# Patient Record
Sex: Male | Born: 1935 | Race: White | Hispanic: No | Marital: Married | Smoking: Never smoker
Health system: Southern US, Community
[De-identification: ages and names within clinical notes are randomized; demographics above are authoritative.]

## PROBLEM LIST (undated history)

## (undated) DIAGNOSIS — G4739 Other sleep apnea: Secondary | ICD-10-CM

## (undated) DIAGNOSIS — N529 Male erectile dysfunction, unspecified: Secondary | ICD-10-CM

## (undated) DIAGNOSIS — K5790 Diverticulosis of intestine, part unspecified, without perforation or abscess without bleeding: Secondary | ICD-10-CM

## (undated) DIAGNOSIS — K649 Unspecified hemorrhoids: Secondary | ICD-10-CM

## (undated) DIAGNOSIS — E559 Vitamin D deficiency, unspecified: Secondary | ICD-10-CM

## (undated) DIAGNOSIS — G4731 Primary central sleep apnea: Secondary | ICD-10-CM

## (undated) DIAGNOSIS — G4730 Sleep apnea, unspecified: Secondary | ICD-10-CM

## (undated) DIAGNOSIS — D8989 Other specified disorders involving the immune mechanism, not elsewhere classified: Secondary | ICD-10-CM

## (undated) DIAGNOSIS — C4491 Basal cell carcinoma of skin, unspecified: Secondary | ICD-10-CM

## (undated) DIAGNOSIS — M4802 Spinal stenosis, cervical region: Secondary | ICD-10-CM

## (undated) DIAGNOSIS — C801 Malignant (primary) neoplasm, unspecified: Secondary | ICD-10-CM

## (undated) DIAGNOSIS — I442 Atrioventricular block, complete: Secondary | ICD-10-CM

## (undated) DIAGNOSIS — M1990 Unspecified osteoarthritis, unspecified site: Secondary | ICD-10-CM

## (undated) DIAGNOSIS — R7301 Impaired fasting glucose: Secondary | ICD-10-CM

## (undated) DIAGNOSIS — B029 Zoster without complications: Secondary | ICD-10-CM

## (undated) DIAGNOSIS — K219 Gastro-esophageal reflux disease without esophagitis: Secondary | ICD-10-CM

## (undated) DIAGNOSIS — M8580 Other specified disorders of bone density and structure, unspecified site: Secondary | ICD-10-CM

## (undated) DIAGNOSIS — N400 Enlarged prostate without lower urinary tract symptoms: Secondary | ICD-10-CM

## (undated) DIAGNOSIS — E785 Hyperlipidemia, unspecified: Secondary | ICD-10-CM

## (undated) DIAGNOSIS — K2970 Gastritis, unspecified, without bleeding: Secondary | ICD-10-CM

## (undated) DIAGNOSIS — E119 Type 2 diabetes mellitus without complications: Secondary | ICD-10-CM

## (undated) DIAGNOSIS — K4090 Unilateral inguinal hernia, without obstruction or gangrene, not specified as recurrent: Secondary | ICD-10-CM

## (undated) DIAGNOSIS — K274 Chronic or unspecified peptic ulcer, site unspecified, with hemorrhage: Secondary | ICD-10-CM

## (undated) HISTORY — DX: Primary central sleep apnea: G47.31

## (undated) HISTORY — DX: Other specified disorders of bone density and structure, unspecified site: M85.80

## (undated) HISTORY — DX: Impaired fasting glucose: R73.01

## (undated) HISTORY — DX: Unspecified osteoarthritis, unspecified site: M19.90

## (undated) HISTORY — DX: Unspecified hemorrhoids: K64.9

## (undated) HISTORY — DX: Malignant (primary) neoplasm, unspecified: C80.1

## (undated) HISTORY — DX: Vitamin D deficiency, unspecified: E55.9

## (undated) HISTORY — DX: Diverticulosis of intestine, part unspecified, without perforation or abscess without bleeding: K57.90

## (undated) HISTORY — PX: TONSILLECTOMY: SUR1361

## (undated) HISTORY — DX: Spinal stenosis, cervical region: M48.02

## (undated) HISTORY — DX: Basal cell carcinoma of skin, unspecified: C44.91

## (undated) HISTORY — DX: Benign prostatic hyperplasia without lower urinary tract symptoms: N40.0

## (undated) HISTORY — DX: Atrioventricular block, complete: I44.2

## (undated) HISTORY — DX: Sleep apnea, unspecified: G47.30

## (undated) HISTORY — DX: Other sleep apnea: G47.39

## (undated) HISTORY — PX: HERNIA REPAIR: SHX51

## (undated) HISTORY — DX: Chronic or unspecified peptic ulcer, site unspecified, with hemorrhage: K27.4

## (undated) HISTORY — DX: Gastritis, unspecified, without bleeding: K29.70

## (undated) HISTORY — PX: CATARACT EXTRACTION, BILATERAL: SHX1313

## (undated) HISTORY — DX: Male erectile dysfunction, unspecified: N52.9

## (undated) HISTORY — DX: Other specified disorders involving the immune mechanism, not elsewhere classified: D89.89

## (undated) HISTORY — DX: Hyperlipidemia, unspecified: E78.5

## (undated) HISTORY — DX: Unilateral inguinal hernia, without obstruction or gangrene, not specified as recurrent: K40.90

## (undated) HISTORY — DX: Zoster without complications: B02.9

---

## 1982-04-11 HISTORY — PX: BACK SURGERY: SHX140

## 1982-04-11 HISTORY — PX: LAMINECTOMY: SHX219

## 1991-04-12 DIAGNOSIS — K274 Chronic or unspecified peptic ulcer, site unspecified, with hemorrhage: Secondary | ICD-10-CM

## 1991-04-12 HISTORY — DX: Chronic or unspecified peptic ulcer, site unspecified, with hemorrhage: K27.4

## 1998-05-21 ENCOUNTER — Ambulatory Visit: Admission: RE | Admit: 1998-05-21 | Discharge: 1998-05-21 | Payer: Self-pay | Admitting: Internal Medicine

## 2000-10-31 ENCOUNTER — Ambulatory Visit (HOSPITAL_COMMUNITY): Admission: RE | Admit: 2000-10-31 | Discharge: 2000-10-31 | Payer: Self-pay | Admitting: Gastroenterology

## 2006-04-11 DIAGNOSIS — C801 Malignant (primary) neoplasm, unspecified: Secondary | ICD-10-CM

## 2006-04-11 DIAGNOSIS — N400 Enlarged prostate without lower urinary tract symptoms: Secondary | ICD-10-CM

## 2006-04-11 HISTORY — DX: Benign prostatic hyperplasia without lower urinary tract symptoms: N40.0

## 2006-04-11 HISTORY — DX: Malignant (primary) neoplasm, unspecified: C80.1

## 2006-12-11 HISTORY — PX: PROSTATE SURGERY: SHX751

## 2007-11-06 ENCOUNTER — Ambulatory Visit: Admission: RE | Admit: 2007-11-06 | Discharge: 2007-12-25 | Payer: Self-pay | Admitting: Radiation Oncology

## 2007-12-20 ENCOUNTER — Encounter (INDEPENDENT_AMBULATORY_CARE_PROVIDER_SITE_OTHER): Payer: Self-pay | Admitting: Urology

## 2007-12-20 ENCOUNTER — Inpatient Hospital Stay (HOSPITAL_COMMUNITY): Admission: RE | Admit: 2007-12-20 | Discharge: 2007-12-22 | Payer: Self-pay | Admitting: Urology

## 2010-08-24 NOTE — Op Note (Signed)
NAMEFAWAZ, BORQUEZ               ACCOUNT NO.:  [id number]   MEDICAL RECORD NO.:  [id number]          PATIENT TYPE:  INP   LOCATION:  1423                         FACILITY:  [facility]   PHYSICIAN:  Heloise Purpura, MD      DATE OF BIRTH:  March 06, 1935   DATE OF PROCEDURE:  12/20/2007  DATE OF DISCHARGE:                               OPERATIVE REPORT   PREOPERATIVE DIAGNOSIS:  Clinically localized adenocarcinoma of the  prostate (clinical stage T1c NX MX).   POSTOPERATIVE DIAGNOSIS:  Clinically localized adenocarcinoma of the  prostate (clinical stage T1c NX MX).   PROCEDURE:  1. Robotic assisted laparoscopic radical prostatectomy (bilateral      nerve sparing).  2. Bilateral laparoscopic pelvic lymphadenectomy.   SURGEON:  Heloise Purpura, MD   ASSISTANTS:  Delia Chimes, NP and Dr. Delman Kitten.   ANESTHESIA:  General.   COMPLICATIONS:  None.   ESTIMATED BLOOD LOSS:  200 mL.   INTRAVENOUS FLUIDS:  1200 mL of lactated Ringer's.   SPECIMENS:  1. Prostate and seminal vesicles.  2. Right pelvic lymph nodes.  3. Left pelvic lymph nodes.   DISPOSITION OF SPECIMEN:  To pathology.   DRAINS:  1. 20 French coude catheter.  2. #19 Blake pelvic drain.   INDICATION:  Mr. Otho Darner is a 76 year old gentleman with clinically  localized adenocarcinoma of the prostate.  After discussion regarding  management options for treatment, he elected to proceed with surgical  therapy and the above procedures.  Potential risks, complications and  alternative treatment options were discussed in detail and informed  consent was obtained.   DESCRIPTION OF PROCEDURE:  The patient was taken to the operating room  and a general anesthetic was administered.  He was given preoperative  antibiotics, placed in the dorsal lithotomy position, and prepped and  draped in the usual sterile fashion.  Next a preoperative time-out was  performed.  A site was selected just to the left of the umbilicus for  placement  of the camera port.  This was placed using a standard open  Hasson technique which allowed entry into the peritoneal cavity under  direct vision and without difficulty.  A Foley catheter had been placed  and a 12 mm port was placed and a pneumoperitoneum was established.  A  zero degree lens was used to inspect the abdomen and there was no  evidence for any intra-abdominal injuries or other abnormalities.  The  remaining ports were then placed.  Bilateral 8 mm ports were placed 10  cm lateral to and just inferior to the camera port site.  An additional  8 mm robotic port was placed in the far left lateral abdominal wall.  A  5 mm port was placed between the camera port and the right robotic port  and an additional 12 mm port was placed in the far right lateral  abdominal wall for laparoscopic assistance.  All ports were placed under  direct vision and without difficulty.  The surgical cart was then  docked.  With the aid of the cautery scissors, the bladder was reflected  posteriorly allowing entry  into the space of Retzius and identification  of the endopelvic fascia and prostate.  The endopelvic fascia was then  incised from the apex back to the base of the prostate bilaterally and  the underlying levator muscle fibers were swept laterally off the  prostate thereby isolating the dorsal venous complex.  The dorsal venous  complex was then stapled and divided with a 45 mm flex ETS stapler.  The  bladder neck was then identified with the aid of Foley catheter  manipulation and was divided anteriorly.  The catheter was noted to be  just to the left of midline and a small median lobe was identified.  The  posterior bladder neck was then divided with care to enucleate the small  median lobe.  The dissection then proceeded between the bladder neck and  prostate until the vasa deferentia and seminal vesicles were identified.  The vasa deferentia were isolated, divided and lifted anteriorly.   The  seminal vesicles were then dissected down to their tips with care to  control the seminal vesicle arterial blood supply.  The seminal vesicles  were then lifted anteriorly and the space between Denonvilliers fascia  and the anterior rectum was bluntly developed thereby isolating the  vascular pedicles of the prostate.  The lateral prostatic fascia was  then incised sharply allowing the neurovascular bundles to be released.  The vascular pedicles of the prostate were then ligated with Hem-o-lok  clips above the level of the neurovascular bundles and divided with  sharp cold scissors dissection.  A small amount of periprosthetic tissue  was left on the prostate.  The neurovascular bundles were then swept off  the apex of the prostate and the urethra.  The urethra was sharply  divided allowing the prostate specimen to be disarticulated.  The pelvis  was then copiously irrigated and hemostasis was ensured.  There was no  evidence for a rectal injury.  Attention then turned to the right pelvic  sidewall.   The fibrofatty tissue between the external iliac vein, confluence of the  iliac vessels, hypogastric artery and Cooper's ligament was dissected  free from the pelvic sidewall with care to preserve the obturator nerve.  Hem-o-lok clips were used for lymphostasis and hemostasis.  An identical  procedure was then performed on the contralateral side.  Both specimens  were removed for permanent pathologic analysis.  Attention then returned  to the pelvis and preparations were made for the urethral anastomosis.  During the dissection between the bladder neck and the prostate, there  was noted to be a small cystotomy noted posteriorly in the midline of  the bladder.  This was well caudal to the trigone and was repaired with  a figure-of-eight 3-0 Vicryl suture. The ureters were identified with  indigo carmine administration. A 2-0 Vicryl slip-knot was then placed  between Denonvilliers fascia  of the posterior bladder neck and the  posterior urethra to reapproximate these structures.  As this slip knot  was cinched down the bladder neck portion did pull through.  However,  the urethra was reapproximated to Denonvilliers fascia bringing it back  into its normal anatomic position in the pelvis.  A separate 2-0 Vicryl  suture was then used to close the defect in the bladder neck and  reapproximate the bladder neck to the posterior urethra.  A double-armed  3-0 Monocryl suture was then used to perform a 360 degree running  tension-free anastomosis between the bladder neck and urethra.  A new 20  [place] coude catheter was placed and irrigated.  There were no blood  clots within the bladder and the anastomosis appeared to be watertight.  A #19 Blake drain was then brought through the left robotic port and  appropriately positioned in the pelvis.  It was secured to the skin with  a nylon suture.  The surgical cart was then undocked.  The right lateral  12 mm port site was closed with the zero Vicryl suture placed with the  aid of a suture passer device.  All remaining ports were removed under  direct vision and the prostate specimen was removed intact within the  Endopouch retrieval bag.  This fascial opening was then closed with a  running zero Vicryl suture.  All  port sites were injected with quarter percent Marcaine and  reapproximated at the skin level with staples.  Sterile dressings were  applied.  The patient appeared to tolerate the procedure well without  complications.  He was able to be extubated and transferred to the  recovery unit in satisfactory condition.      Heloise Purpura, MD  Electronically Signed     LB/MEDQ  D:  12/20/2007  T:  12/20/2007  Job:  [id number]

## 2010-08-24 NOTE — Discharge Summary (Signed)
Jeffery [name], Jeffery [name]               ACCOUNT NO.:  [id number]   MEDICAL RECORD NO.:  [id number]          PATIENT TYPE:  INP   LOCATION:  1423                         [facility]AC[facility]   PHYSICIAN:  Heloise Purpura, MD      DATE OF BIRTH:  [birth year]   DATE OF ADMISSION:  12/20/2007  DATE OF DISCHARGE:  12/22/2007                               DISCHARGE SUMMARY   ADMISSION DIAGNOSIS:  Clinically localized adenocarcinoma of the  prostate.   POSTOPERATIVE DIAGNOSIS:  Clinically localized adenocarcinoma of the  prostate.   HISTORY AND PHYSICAL:  For full details please see admission history and  physical.  Briefly, [name] is a 76 year old gentleman with clinically  localized adenocarcinoma of the prostate.  After our discussion  regarding management options for treatment, he elected to proceed with  surgical therapy and a robotic prostatectomy.   HOSPITAL COURSE:  On December 20, 2007, the patient was taken to the  operating room and underwent a robotic assisted laparoscopic radical  prostatectomy and bilateral pelvic lymphadenectomy.  He tolerated this  procedure well without complications.  Postoperatively, he was able to  be transferred to a regular hospital room following recovery from  anesthesia.  He was able to begin ambulating the night of surgery and  remained hemodynamically stable.  On postoperative day #1, his  hematocrit was checked and was noted to be 35.9 which had decreased from  43.5.  This was felt to likely be related to hemodilution and he did  remain monitored closely.  He remained hemodynamically stable.  A repeat  hematocrit was 33.9 and did not appear concerning for any acute blood  loss.  He was able to begin a clear liquid diet, which he tolerated  without difficulty.  He was able to be transitioned to oral pain  medication and was reevaluated on the afternoon of postoperative day #1.  At this time, he still was having significant abdominal pain and was not  ambulating as well as would be hoped for.  It was decided to have him  remain overnight for further evaluation.  On postoperative day #2, he  was ambulating well and tolerating a liquid diet, and did have return of  bowel function.  In addition, he maintained excellent urine output with  minimal output from his pelvic drain.  His pelvic drain was, therefore,  removed.   DISPOSITION:  Home.   DISCHARGE MEDICATIONS:  He was instructed to resume his regular home  medications excepting any aspirin, nonsteroidal anti-inflammatory drugs,  or herbal supplements.  He was given a prescription to take Vicodin as  needed for pain and told to begin Cipro 1 day prior to his return visit  for Foley catheter removal.   DISCHARGE INSTRUCTIONS:  He was instructed to be ambulatory but  specifically told to refrain from any heavy lifting, strenuous activity,  or driving.  He was told to gradually advance his diet once passing  flatus.  He was also instructed on routine Foley catheter care.   FOLLOW UP:  He will follow up in 1 week for removal of Foley catheter  and to discuss his surgical pathology in detail.      Heloise Purpura, MD  Electronically Signed     LB/MEDQ  D:  12/23/2007  T:  12/23/2007  Job:  [id number]

## 2011-01-12 LAB — BASIC METABOLIC PANEL
Calcium: 8.2 — ABNORMAL LOW
Chloride: 102
Chloride: 103
Creatinine, Ser: 0.77
GFR calc Af Amer: >60
GFR calc non Af Amer: >60
Glucose, Bld: 188 — ABNORMAL HIGH
Potassium: 4.1
Sodium: 139

## 2011-01-12 LAB — TYPE AND SCREEN: Antibody Screen: NEGATIVE

## 2011-01-12 LAB — CBC
HCT: 44.9
Hemoglobin: 14.6
RDW: 12.1

## 2011-01-12 LAB — HEMOGLOBIN AND HEMATOCRIT, BLOOD
HCT: 35.9 — ABNORMAL LOW
HCT: 43.5
Hemoglobin: 12.1 — ABNORMAL LOW

## 2011-08-02 ENCOUNTER — Other Ambulatory Visit: Payer: Self-pay | Admitting: Dermatology

## 2011-10-18 ENCOUNTER — Other Ambulatory Visit (INDEPENDENT_AMBULATORY_CARE_PROVIDER_SITE_OTHER): Payer: Self-pay | Admitting: Surgery

## 2012-02-28 ENCOUNTER — Other Ambulatory Visit: Payer: Self-pay | Admitting: Orthopedic Surgery

## 2012-02-28 NOTE — Progress Notes (Signed)
Talked with pt 

## 2012-02-29 NOTE — H&P (Signed)
    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  He presents for evaluation of chronic swelling and stiffness of his left long finger PIP joint with locking of the finger in flexion consistent with a chronic trigger finger. He also has a very small cyst overlying the A-3 pulley of his right long finger flexor sheath.  Jake Shark has seronegative arthritis managed by [name] with Plaquenil.  He has history of prostate cancer diagnosed in 2009.  He has had basal cell skin cancer.  He has had hyperlipidemia and history of duodenal ulcer.  He denies numbness or pain.  He does have background osteoarthritis.    His past history is reviewed in detail.  He is 6'2" tall and weighs 193 pounds.  He is not on any prescription medication for discomfort.  His past history reveals that he became severely nauseated on Demerol.  He is currently taking Plaquenil 200 mg. two doses daily, Lovaza one capsule daily, Zantac 150 mg. daily, WelChol 625 mg. six tablets daily,  bupropion 150 mg. daily and Voltaren Gel apply to the fingers PRN.  Prior surgery, tonsillectomy 1948, back surgery in 1984, prostate surgery 2009.   Social history reveals that he is married, he is a nonsmoker, and he enjoys a rare alcoholic beverage.   Family history is detailed and positive for history of coronary artery disease, high blood pressure and arthritis affecting primary relatives.  14-point review of systems reveals corrective lenses, cataracts, he is anticipating cataract surgery in one month.  He had duodenal ulcer in 1993 without signs of recurrence.    Physical examination reveals a well appearing 77 year-old gentleman. He has Heberden's and Bouchard's nodes evidencing significant osteoarthritis.  He has full active flexion/extension of his finger and thumb with locking of his left long finger in flexion.  He has a 7 degree flexion contracture of his left long finger PIP joint. Pulse and capillary refill  are intact.  He has pain at the base of his right and left thumbs consistent with osteoarthritis. He has pain at his right wrist overlying the STT joint aggravated by radial and ulnar deviation and palmar flexion of the wrist.    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   ASSESSMENT:   1)  Locking left long finger trigger finger. 2) Pan trapezial arthrosis right wrist.   3) Left thumb CMC arthrosis.   PLAN:  We will schedule Jake Shark for release of his left long finger A-1 pulley and debridement of fibrotic tenosynovium at a mutually convenient time in the near future.  The surgery and aftercare were described in detail.   Left hand re examined in the holding area pre op.  Mr Merilyn Baba now has a 10 degree flexion contracture of the PIP joint and more stiffness in flexion.  This is noted to Mr and Mrs Merilyn Baba with inability to contact the palm in flexion.  This is the consequence of tolerating a trigger finger for many months and will take months of therapy to work out.  Questions were invited and answered in detail.  H&P documentation: 03/01/2012  -History and Physical Reviewed  -Patient has been re-examined  -No change in the plan of care  Wyn Forster, MD

## 2012-03-01 ENCOUNTER — Encounter (HOSPITAL_BASED_OUTPATIENT_CLINIC_OR_DEPARTMENT_OTHER): Payer: Self-pay

## 2012-03-01 ENCOUNTER — Encounter (HOSPITAL_BASED_OUTPATIENT_CLINIC_OR_DEPARTMENT_OTHER)
Admission: RE | Disposition: A | Discharge status: Home / Self Care | Payer: Self-pay | Source: Ambulatory Visit | Attending: Orthopedic Surgery

## 2012-03-01 ENCOUNTER — Ambulatory Visit (HOSPITAL_BASED_OUTPATIENT_CLINIC_OR_DEPARTMENT_OTHER)
Admission: RE | Admit: 2012-03-01 | Discharge: 2012-03-01 | Disposition: A | Discharge status: Home / Self Care | Payer: Medicare Other | Source: Ambulatory Visit | Attending: Orthopedic Surgery | Admitting: Orthopedic Surgery

## 2012-03-01 DIAGNOSIS — Z8261 Family history of arthritis: Secondary | ICD-10-CM | POA: Insufficient documentation

## 2012-03-01 DIAGNOSIS — M1990 Unspecified osteoarthritis, unspecified site: Secondary | ICD-10-CM | POA: Insufficient documentation

## 2012-03-01 DIAGNOSIS — Z8546 Personal history of malignant neoplasm of prostate: Secondary | ICD-10-CM | POA: Insufficient documentation

## 2012-03-01 DIAGNOSIS — Z85828 Personal history of other malignant neoplasm of skin: Secondary | ICD-10-CM | POA: Insufficient documentation

## 2012-03-01 DIAGNOSIS — M729 Fibroblastic disorder, unspecified: Secondary | ICD-10-CM | POA: Insufficient documentation

## 2012-03-01 DIAGNOSIS — M6530 Trigger finger, unspecified finger: Secondary | ICD-10-CM | POA: Insufficient documentation

## 2012-03-01 DIAGNOSIS — Z8249 Family history of ischemic heart disease and other diseases of the circulatory system: Secondary | ICD-10-CM | POA: Insufficient documentation

## 2012-03-01 DIAGNOSIS — M129 Arthropathy, unspecified: Secondary | ICD-10-CM | POA: Insufficient documentation

## 2012-03-01 HISTORY — PX: TRIGGER FINGER RELEASE: SHX641

## 2012-03-01 HISTORY — DX: Gastro-esophageal reflux disease without esophagitis: K21.9

## 2012-03-01 SURGERY — MINOR RELEASE TRIGGER FINGER/A-1 PULLEY
Anesthesia: LOCAL | Site: Finger | Laterality: Left | Wound class: Clean

## 2012-03-01 MED ORDER — CHLORHEXIDINE GLUCONATE 4 % EX LIQD: 60.0000 mL | Freq: Once | CUTANEOUS | Status: DC

## 2012-03-01 MED ORDER — LIDOCAINE HCL 2 % IJ SOLN
INTRAMUSCULAR | Status: DC | PRN
  Administered 2012-03-01: 4 mL

## 2012-03-01 MED ORDER — TRAMADOL HCL 50 MG PO TABS: ORAL_TABLET | ORAL | Status: DC

## 2012-03-01 SURGICAL SUPPLY — 33 items
BANDAGE ADHESIVE 1X3 (GAUZE/BANDAGES/DRESSINGS) IMPLANT
BLADE SURG 15 STRL LF DISP TIS (BLADE) ×1 IMPLANT
BLADE SURG 15 STRL SS (BLADE) ×1
BNDG ELASTIC 2 VLCR STRL LF (GAUZE/BANDAGES/DRESSINGS) ×2 IMPLANT
BNDG ESMARK 4X9 LF (GAUZE/BANDAGES/DRESSINGS) IMPLANT
BRUSH SCRUB EZ PLAIN DRY (MISCELLANEOUS) ×2 IMPLANT
CLOTH BEACON ORANGE TIMEOUT ST (SAFETY) ×2 IMPLANT
CORDS BIPOLAR (ELECTRODE) IMPLANT
COVER MAYO STAND STRL (DRAPES) ×2 IMPLANT
COVER TABLE BACK 60X90 (DRAPES) IMPLANT
CUFF TOURNIQUET SINGLE 18IN (TOURNIQUET CUFF) ×2 IMPLANT
DECANTER SPIKE VIAL GLASS SM (MISCELLANEOUS) IMPLANT
DRAPE SURG 17X23 STRL (DRAPES) ×2 IMPLANT
GAUZE SPONGE 4X4 12PLY STRL LF (GAUZE/BANDAGES/DRESSINGS) ×4 IMPLANT
GLOVE BIO SURGEON STRL SZ 6.5 (GLOVE) ×2 IMPLANT
GLOVE BIOGEL M STRL SZ7.5 (GLOVE) ×2 IMPLANT
GLOVE ORTHO TXT STRL SZ7.5 (GLOVE) ×2 IMPLANT
GOWN PREVENTION PLUS XLARGE (GOWN DISPOSABLE) ×2 IMPLANT
NEEDLE 27GAX1X1/2 (NEEDLE) ×4 IMPLANT
PACK BASIN DAY SURGERY FS (CUSTOM PROCEDURE TRAY) IMPLANT
PADDING CAST ABS 4INX4YD NS (CAST SUPPLIES) ×1
PADDING CAST ABS COTTON 4X4 ST (CAST SUPPLIES) ×1 IMPLANT
SPONGE GAUZE 4X4 12PLY (GAUZE/BANDAGES/DRESSINGS) ×2 IMPLANT
STOCKINETTE 4X48 STRL (DRAPES) ×2 IMPLANT
STRIP CLOSURE SKIN 1/2X4 (GAUZE/BANDAGES/DRESSINGS) ×2 IMPLANT
SUT PROLENE 3 0 PS 2 (SUTURE) IMPLANT
SUT PROLENE 4 0 P 3 18 (SUTURE) ×2 IMPLANT
SYR 3ML 23GX1 SAFETY (SYRINGE) IMPLANT
SYR CONTROL 10ML LL (SYRINGE) ×4 IMPLANT
TOWEL OR 17X24 6PK STRL BLUE (TOWEL DISPOSABLE) ×4 IMPLANT
TRAY DSU PREP LF (CUSTOM PROCEDURE TRAY) ×2 IMPLANT
UNDERPAD 30X30 INCONTINENT (UNDERPADS AND DIAPERS) IMPLANT
WATER STERILE IRR 1000ML POUR (IV SOLUTION) IMPLANT

## 2012-03-01 NOTE — Op Note (Signed)
[id number] 

## 2012-03-01 NOTE — Brief Op Note (Signed)
03/01/2012  8:24 AM  PATIENT:  Tibor H O'Tuel Jr.  77 y.o. male  PRE-OPERATIVE DIAGNOSIS:  locking stenosing tenosynovitis left long finger / dupuytren's  POST-OPERATIVE DIAGNOSIS:  locking stenosing tenosynovitis left long finger / dupuytrens  PROCEDURE:  Procedure(s) (LRB) with comments: MINOR RELEASE TRIGGER FINGER/A-1 PULLEY (Left) - long finger  SURGEON:  Surgeon(s) and Role:    * [name], MD - Primary  PHYSICIAN ASSISTANT:   ASSISTANTS:Kosei Rhodes Dasnoit,P.A-C    ANESTHESIA:   local  EBL:     BLOOD ADMINISTERED:none  DRAINS: none   LOCAL MEDICATIONS USED:  LIDOCAINE   SPECIMEN:  No Specimen  DISPOSITION OF SPECIMEN:  N/A  COUNTS:  YES  TOURNIQUET:   Total Tourniquet Time Documented: Upper Arm (Left) - 6 minutes  DICTATION: .Other Dictation: Dictation Number [id number]  PLAN OF CARE: Discharge to home after PACU  PATIENT DISPOSITION:  PACU - hemodynamically stable.

## 2012-03-02 ENCOUNTER — Encounter (HOSPITAL_BASED_OUTPATIENT_CLINIC_OR_DEPARTMENT_OTHER): Payer: Self-pay | Admitting: Orthopedic Surgery

## 2012-03-02 ENCOUNTER — Encounter (HOSPITAL_BASED_OUTPATIENT_CLINIC_OR_DEPARTMENT_OTHER): Payer: Self-pay

## 2012-03-02 NOTE — Op Note (Signed)
NAMERIGGS, DINEEN              ACCOUNT NO.:  [id number]  MEDICAL RECORD NO.:  [id number]  LOCATION:                               FACILITY:  [facility]  PHYSICIAN:  Katy Fitch. Fallynn Gravett, M.D. 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  OPERATION:  Release of left long finger A1 pulley with incidental resection of Dupuytren's palmar fibromatosis and gentle manipulation of PIP joint in flexion and extension to recover range of motion.  OPERATING SURGEON:  Katy Fitch. Charrie Mcconnon, MD  ASSISTANT:  Marveen Reeks Dasnoit, PA-C  ANESTHESIA:  Lidocaine 2% metacarpal head level block and flexor sheath block of left long finger.  This was performed as a minor operating room procedure.  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  We have been acquainted for more than 25 years as we were at one point neighbors.  He presented for evaluation of carpometacarpal arthrosis of his wrist and locking left long finger.  He was developing an early flexion contracture to left long finger.  We advised that if he allowed his trigger finger to be chronic, he may develop a very stiff PIP joint.  At this time, he is scheduled for release of his left long finger A1 pulley and we anticipated incidental resection of early  Dupuytren's palmar fibromatosis overlying the pretendinous fibers to left long finger noted on clinical examination in the office.  Upon re-examination in the holding area, [name] was noted to have had an increase in his PIP flexion contracture from 7 degrees at his office visit to 10 degrees at this time.  He has also had blocking of further flexion at about 85-90 degrees due to his PIP stiffness.  We advised that it will take months to have resolution of his stiffness due to the chronic stenosing tenosynovitis he has endured.  We will immediately enrol him in therapy postoperatively.  The surgery was reviewed.  Questions were invited and answered in detail.  PROCEDURE:  Rutherford Nail was brought to room 1 at [facility] and placed in supine position on the operating table.  His proper surgical site was identified per protocol with a marking pen in the holding area.  After anesthesia informed consent and alcohol Betadine prep, 4 mL of 2% lidocaine were infiltrated in the path of the intended incision and the flexor sheath of the left long finger.  After 5 minutes, anesthesia was near complete, although there was some residual sensibility in the superficial skin.  Therefore, we placed another mL directly in the subdermal region.  After few more moments, excellent anesthesia was achieved.  The left hand and arm were  then prepped with Betadine soap and solution, sterilely draped.  Following exsanguination of left arm with Esmarch bandage, an arterial tourniquet on proximal brachium was inflated to 250 mmHg due to mild systolic hypertension.  Procedure commenced with 1 cm oblique incision extending in the distal palmar crease.  Subcutaneous tissues were carefully divided, taking care to identify and formally resect the pretendinous fibers of the palmar fascia to the long finger.  Soft tissues were spread revealing the A1 pulley.  The A1 pulley was opaque, thickened,  and fibrotic.  This was released with scissors along its radial border to prevent ulnar drift.  We searched for A0 pulley and none was identified.  The tendons delivered and synovial adhesions released with scissors.  Mr. Merilyn Baba then demonstrated full flexion of his finger nail to the distal palmar crease.  He still had a residual 10 degree flexion contracture in extension.  We gently manipulated the joint and achieved near full extension.  The wound was then repaired with intradermal 4-0 Prolene suture.  The tourniquet was released with pressure held on the wound for 5 minutes.  Steri-Strip was applied followed by sterile gauze dressing with Ace wrap.  We have advised [name] that he may advance to a Band-Aid after 4 days.  He will sponge bathe his hand while he is in his dressing.  We will see him back for followup in 5 days for dressing change and likely remove his sutures in 10 days due to the Thanksgiving holiday.     Katy Fitch Harper Vandervoort, M.D.     RVS/MEDQ  D:  03/01/2012  T:  03/01/2012  Job:  [id number]  cc:   [name], MD Kathryne Hitch, MD

## 2012-07-03 ENCOUNTER — Encounter (INDEPENDENT_AMBULATORY_CARE_PROVIDER_SITE_OTHER): Payer: Self-pay

## 2012-07-05 ENCOUNTER — Encounter (INDEPENDENT_AMBULATORY_CARE_PROVIDER_SITE_OTHER): Payer: Self-pay | Admitting: General Surgery

## 2012-07-05 ENCOUNTER — Ambulatory Visit (INDEPENDENT_AMBULATORY_CARE_PROVIDER_SITE_OTHER): Payer: Medicare Other | Admitting: General Surgery

## 2012-07-05 VITALS — BP 148/80 | HR 76 | Temp 98.0°F | Resp 18 | Ht 73.0 in | Wt 190.0 lb

## 2012-07-05 DIAGNOSIS — K4090 Unilateral inguinal hernia, without obstruction or gangrene, not specified as recurrent: Secondary | ICD-10-CM

## 2012-07-05 NOTE — Patient Instructions (Signed)
Due to have a reducible right inguinal hernia.  You'll be scheduled for an open repair of a right inguinal hernia with mesh. This is also known as a Cytogeneticist.     Inguinal Hernia, Adult Muscles help keep everything in the body in its proper place. But if a weak spot in the muscles develops, something can poke through. That is called a hernia. When this happens in the lower part of the belly (abdomen), it is called an inguinal hernia. (It takes its name from a part of the body in this region called the inguinal canal.) A weak spot in the wall of muscles lets some fat or part of the small intestine bulge through. An inguinal hernia can develop at any age. Men get them more often than women. CAUSES  In adults, an inguinal hernia develops over time.  It can be triggered by:  Suddenly straining the muscles of the lower abdomen.  Lifting heavy objects.  Straining to have a bowel movement. Difficult bowel movements (constipation) can lead to this.  Constant coughing. This may be caused by smoking or lung disease.  Being overweight.  Being pregnant.  Working at a job that requires long periods of standing or heavy lifting.  Having had an inguinal hernia before. One type can be an emergency situation. It is called a strangulated inguinal hernia. It develops if part of the small intestine slips through the weak spot and cannot get back into the abdomen. The blood supply can be cut off. If that happens, part of the intestine may die. This situation requires emergency surgery. SYMPTOMS  Often, a small inguinal hernia has no symptoms. It is found when a healthcare provider does a physical exam. Larger hernias usually have symptoms.   In adults, symptoms may include:  A lump in the groin. This is easier to see when the person is standing. It might disappear when lying down.  In men, a lump in the scrotum.  Pain or burning in the groin. This occurs especially when lifting,  straining or coughing.  A dull ache or feeling of pressure in the groin.  Signs of a strangulated hernia can include:  A bulge in the groin that becomes very painful and tender to the touch.  A bulge that turns red or purple.  Fever, nausea and vomiting.  Inability to have a bowel movement or to pass gas. DIAGNOSIS  To decide if you have an inguinal hernia, a healthcare provider will probably do a physical examination.  This will include asking questions about any symptoms you have noticed.  The healthcare provider might feel the groin area and ask you to cough. If an inguinal hernia is felt, the healthcare provider may try to slide it back into the abdomen.  Usually no other tests are needed. TREATMENT  Treatments can vary. The size of the hernia makes a difference. Options include:  Watchful waiting. This is often suggested if the hernia is small and you have had no symptoms.  No medical procedure will be done unless symptoms develop.  You will need to watch closely for symptoms. If any occur, contact your healthcare provider right away.  Surgery. This is used if the hernia is larger or you have symptoms.  Open surgery. This is usually an outpatient procedure (you will not stay overnight in a hospital). An cut (incision) is made through the skin in the groin. The hernia is put back inside the abdomen. The weak area in the muscles is then repaired  by herniorrhaphy or hernioplasty. Herniorrhaphy: in this type of surgery, the weak muscles are sewn back together. Hernioplasty: a patch or mesh is used to close the weak area in the abdominal wall.  Laparoscopy. In this procedure, a surgeon makes small incisions. A thin tube with a tiny video camera (called a laparoscope) is put into the abdomen. The surgeon repairs the hernia with mesh by looking with the video camera and using two long instruments. HOME CARE INSTRUCTIONS   After surgery to repair an inguinal hernia:  You will need  to take pain medicine prescribed by your healthcare provider. Follow all directions carefully.  You will need to take care of the wound from the incision.  Your activity will be restricted for awhile. This will probably include no heavy lifting for several weeks. You also should not do anything too active for a few weeks. When you can return to work will depend on the type of job that you have.  During "watchful waiting" periods, you should:  Maintain a healthy weight.  Eat a diet high in fiber (fruits, vegetables and whole grains).  Drink plenty of fluids to avoid constipation. This means drinking enough water and other liquids to keep your urine clear or pale yellow.  Do not lift heavy objects.  Do not stand for long periods of time.  Quit smoking. This should keep you from developing a frequent cough. SEEK MEDICAL CARE IF:   A bulge develops in your groin area.  You feel pain, a burning sensation or pressure in the groin. This might be worse if you are lifting or straining.  You develop a fever of more than 100.5 F (38.1 C). SEEK IMMEDIATE MEDICAL CARE IF:   Pain in the groin increases suddenly.  A bulge in the groin gets bigger suddenly and does not go down.  For men, there is sudden pain in the scrotum. Or, the size of the scrotum increases.  A bulge in the groin area becomes red or purple and is painful to touch.  You have nausea or vomiting that does not go away.  You feel your heart beating much faster than normal.  You cannot have a bowel movement or pass gas.  You develop a fever of more than 102.0 F (38.9 C). Document Released: 08/14/2008 Document Revised: 06/20/2011 Document Reviewed: 08/14/2008 [facility] Patient Information 2013 [place], [place].

## 2012-07-05 NOTE — Progress Notes (Signed)
Patient ID: Jeffery Virgil Benedict., male   DOB: October 10, 1934, 78 y.o.   MRN: [id number]  Chief Complaint  Patient presents with  . Inguinal Hernia    right    HPI Jeffery H Salman Wellen. is a 78 y.o. male.  He is referred by Dr. Creola Corn for evaluation of a right inguinal hernia.  The patient has no prior history of hernia. In December of 2013 he noticed a bulge in his right groin. It has not changed much. He has slight pain. He had this evaluated at the [facility] clinic when he was on vacation. They gave him a prescription for a truss which he wears. He says it keeps the bulge in. He is interested  in having this repaired before he travels to [place] in June. It does not sound like he is having much pain.  Comorbidities include a robotic prostatectomy by Dr. Laverle Patter, for which he has done well. On plaquenil for some type of arthritis although the diagnosis of  RA  is not clear, hyperlipidemia. Otherwise in excellent health.  HPI  Past Medical History  Diagnosis Date  . GERD (gastroesophageal reflux disease)   . Peptic ulcer disease with hemorrhage 1993  . Diverticulosis   . Hemorrhoids   . Hyperlipidemia   . Arthritis   . Cancer 2008    Prostate  . ED (erectile dysfunction)   . BPH (benign prostatic hypertrophy) 2008  . RBBB (right bundle branch block)   . IFG (impaired fasting glucose)   . Vitamin D deficiency   . Osteopenia   . Complex sleep apnea syndrome   . BCC (basal cell carcinoma)   . Shingles   . Hernia, inguinal     Past Surgical History  Procedure Laterality Date  . Trigger finger release  03/01/2012    Procedure: MINOR RELEASE TRIGGER FINGER/A-1 PULLEY;  Surgeon: [name], MD;  Location: [facility];  Service: Orthopedics;  Laterality: Left;  long finger  . Laminectomy  1984    L 5  . Prostate surgery  12/2006    Robotic  . Cataract extraction, bilateral    . Tonsillectomy  78 years old    Family History  Problem Relation Age of Onset   . Heart failure Mother     Social History History  Substance Use Topics  . Smoking status: Never Smoker   . Smokeless tobacco: Not on file  . Alcohol Use: 1.8 - 3 oz/week    3-5 Glasses of wine per week    Allergies  Allergen Reactions  . Demerol (Meperidine) Nausea Only    Current Outpatient Prescriptions  Medication Sig Dispense Refill  . buPROPion (ZYBAN) 150 MG 12 hr tablet Take 150 mg by mouth 1 day or 1 dose.      . Calcium Carbonate (CALCIUM 600 PO) Take 600 mg by mouth daily.      Marland Kitchen co-enzyme Q-10 30 MG capsule Take 30 mg by mouth 3 (three) times daily.      . colesevelam (WELCHOL) 625 MG tablet Take 1,875 mg by mouth 6 (six) times daily.      . diclofenac sodium (VOLTAREN) 1 % GEL Apply topically.      . hydroxychloroquine (PLAQUENIL) 200 MG tablet Take 200 mg by mouth 2 (two) times daily.      . ranitidine (ZANTAC) 150 MG tablet Take 150 mg by mouth 2 (two) times daily.      . Vitamin D, Ergocalciferol, (DRISDOL) 50000 UNITS CAPS  Take 50,000 Units by mouth.       No current facility-administered medications for this visit.    Review of Systems Review of Systems  Constitutional: Negative for fever, chills and unexpected weight change.  HENT: Negative for hearing loss, congestion, sore throat, trouble swallowing and voice change.   Eyes: Negative for visual disturbance.  Respiratory: Negative for cough and wheezing.   Cardiovascular: Negative for chest pain, palpitations and leg swelling.  Gastrointestinal: Negative for nausea, vomiting, abdominal pain, diarrhea, constipation, blood in stool, abdominal distention, anal bleeding and rectal pain.  Genitourinary: Negative for hematuria and difficulty urinating.  Musculoskeletal: Negative for arthralgias.  Skin: Negative for rash and wound.  Neurological: Negative for seizures, syncope, weakness and headaches.  Hematological: Negative for adenopathy. Does not bruise/bleed easily.  Psychiatric/Behavioral: Negative for  confusion.    Blood pressure 148/80, pulse 76, temperature 98 F (36.7 C), temperature source Temporal, resp. rate 18, height 6\' 1"  (1.854 m), weight 190 lb (86.183 kg).  Physical Exam Physical Exam  Constitutional: He is oriented to person, place, and time. He appears well-developed and well-nourished. No distress.  HENT:  Head: Normocephalic.  Nose: Nose normal.  Mouth/Throat: No oropharyngeal exudate.  Eyes: Conjunctivae and EOM are normal. Pupils are equal, round, and reactive to light. Right eye exhibits no discharge. Left eye exhibits no discharge. No scleral icterus.  Neck: Normal range of motion. Neck supple. No JVD present. No tracheal deviation present. No thyromegaly present.  Cardiovascular: Normal rate, regular rhythm, normal heart sounds and intact distal pulses.   No murmur heard. Pulmonary/Chest: Effort normal and breath sounds normal. No stridor. No respiratory distress. He has no wheezes. He has no rales. He exhibits no tenderness.  Abdominal: Soft. Bowel sounds are normal. He exhibits no distension and no mass. There is no tenderness. There is no rebound and no guarding.  Well-healed trocar incisions from laparoscopic robotic prostatectomy.  Genitourinary:  Small, reducible right inguinal hernia. No evidence of hernia on the left. Penis scrotum and testes are normal. No scrotal mass.  Musculoskeletal: Normal range of motion. He exhibits no edema and no tenderness.  Lymphadenopathy:    He has no cervical adenopathy.  Neurological: He is alert and oriented to person, place, and time. He has normal reflexes. Coordination normal.  Skin: Skin is warm and dry. No rash noted. He is not diaphoretic. No erythema. No pallor.  Psychiatric: He has a normal mood and affect. His behavior is normal. Judgment and thought content normal.    Data Reviewed Office notes from [name]  Assessment    Reducible right inguinal hernia  Prostate cancer, excellent recovery following  robotic prostatectomy  Arthritis, on plaquenil  Hyperlipidemia     Plan    I told him that hernia repair was elective. I discussed techniques for repair. It was his desire to proceed with repair at this time So he could be fully recovered before his [place] trip this June.  We decided to proceed with an open repair of his right internal hernia with mesh because he had had previous pelvic surgery, and the scar tissue might interfere with laparoscopic approach.  He will be scheduled for open repair of his right inguinal hernia with mesh in the near future. This is an outpatient surgery.  I discussed the indications, details, techniques, temporary disability issues, and numerous risks of the surgery with him. However patient information booklet with him. He understands all these issues and all his questions were answered. He agrees with this  plan.        Angelia Mould. Derrell Lolling, M.D., [facility] General and Minimally invasive Surgery Breast and Colorectal Surgery Office:   [phone] Pager:   [phone]  07/05/2012, 10:26 AM

## 2012-07-10 ENCOUNTER — Other Ambulatory Visit: Payer: Self-pay | Admitting: Dermatology

## 2012-07-11 ENCOUNTER — Other Ambulatory Visit: Payer: Self-pay | Admitting: Neurology

## 2012-07-24 DIAGNOSIS — K4090 Unilateral inguinal hernia, without obstruction or gangrene, not specified as recurrent: Secondary | ICD-10-CM

## 2012-07-26 ENCOUNTER — Telehealth (INDEPENDENT_AMBULATORY_CARE_PROVIDER_SITE_OTHER): Payer: Self-pay | Admitting: *Deleted

## 2012-07-26 NOTE — Telephone Encounter (Signed)
Patient's wife called to ask for refill of pain medication.  Norco 5/325mg  1 tablet every 6 hours as needed for pain, #30 no refills.  Called to [facility] [phone].  Wife reports that patient is unable to take Ibuprofen or any NSAIDs due to stomach ulcers.  Encourage wife to have patient rest and elevating for any swelling.  Wife states understanding and agreeable at this time.

## 2012-07-26 NOTE — Telephone Encounter (Signed)
Patient called again. Stating he is having swelling and bruising in his testicles and they are very sore. I again encouraged rest and elevating his legs. I also advised ice packs on the area to help with swelling. He will call with any other problems.

## 2012-08-09 ENCOUNTER — Encounter (INDEPENDENT_AMBULATORY_CARE_PROVIDER_SITE_OTHER): Payer: Self-pay | Admitting: General Surgery

## 2012-08-09 ENCOUNTER — Ambulatory Visit (INDEPENDENT_AMBULATORY_CARE_PROVIDER_SITE_OTHER): Payer: Medicare Other | Admitting: General Surgery

## 2012-08-09 VITALS — BP 130/70 | HR 90 | Temp 97.2°F | Resp 18 | Ht 74.0 in | Wt 185.0 lb

## 2012-08-09 DIAGNOSIS — K4090 Unilateral inguinal hernia, without obstruction or gangrene, not specified as recurrent: Secondary | ICD-10-CM

## 2012-08-09 NOTE — Patient Instructions (Signed)
You are healing from your right inguinal hernia surgery without any obvious surgical complications.  Continue to take a long walk everyday. This helps your flexibility.  You may resume normal physical activities without restriction after May 15.  Return to see Dr. Derrell Lolling if further problems arise.

## 2012-08-09 NOTE — Progress Notes (Signed)
Patient ID: Jeffery [name]., male   DOB: Jun 17, 1934, 78 y.o.   MRN: [id number] History: This patient underwent open repair of right inguinal hernia with mesh on 07/24/2012. He has done well. Pain has subsided. He is walking a mile a day. Initial constipation has resolved. No real wound problems.  Exam: Patient looks well. Friendly as usual. Right inguinal incision is healing normally. A little bit of thickening, typical. No infection. No hematoma. No seroma. Penis scrotum and testes normal.  Assessment: Right inguinal hernia, recovering uneventfully following open repair with mesh  Plan: Diet & activities discussed Resume all his activities after May 15. Return to see me if further problems arise.   Angelia Mould. Derrell Lolling, M.D., [facility] General and Minimally invasive Surgery Breast and Colorectal Surgery Office:   [phone] Pager:   [phone]

## 2012-09-11 ENCOUNTER — Other Ambulatory Visit: Payer: Self-pay | Admitting: Neurology

## 2012-09-12 ENCOUNTER — Telehealth: Payer: Self-pay | Admitting: Neurology

## 2012-09-12 MED ORDER — BUPROPION HCL ER (SR) 150 MG PO TB12: 150.0000 mg | ORAL_TABLET | Freq: Every day | ORAL | Status: DC

## 2012-09-12 NOTE — Telephone Encounter (Signed)
I called patient.  Got no answer.  Left message.  Asked him to call back and schedule appt, and we will refill meds until seen.

## 2013-01-23 ENCOUNTER — Ambulatory Visit: Payer: Self-pay | Admitting: Neurology

## 2013-01-24 ENCOUNTER — Ambulatory Visit (INDEPENDENT_AMBULATORY_CARE_PROVIDER_SITE_OTHER): Payer: Medicare Other | Admitting: Neurology

## 2013-01-24 ENCOUNTER — Encounter (INDEPENDENT_AMBULATORY_CARE_PROVIDER_SITE_OTHER): Payer: Self-pay

## 2013-01-24 ENCOUNTER — Encounter: Payer: Self-pay | Admitting: Neurology

## 2013-01-24 VITALS — BP 117/66 | HR 86 | Resp 18 | Ht 76.0 in | Wt 190.0 lb

## 2013-01-24 DIAGNOSIS — G629 Polyneuropathy, unspecified: Secondary | ICD-10-CM | POA: Insufficient documentation

## 2013-01-24 DIAGNOSIS — G609 Hereditary and idiopathic neuropathy, unspecified: Secondary | ICD-10-CM

## 2013-01-24 MED ORDER — BUPROPION HCL ER (SR) 150 MG PO TB12: 150.0000 mg | ORAL_TABLET | Freq: Every day | ORAL | Status: DC

## 2013-01-24 NOTE — Progress Notes (Signed)
[name]  Provider:  [name], M D  Referring Provider: [name], MD Primary Care Physician:  [name], MD  Chief Complaint  Patient presents with  . Follow-Up Visit    Sleep Apnea    HPI:  Jeffery [name]. is a 78 y.o. male  Is seen here as a referral/ revisit  from [name] for neuropathy.  This meanwhile 78 year old patient of [name]/ [name]  has followed up since May 2011.  Initially he was referred for a polysomnogram based on chronic fatigue complaints and was diagnosed as complex apnea. The apnea short-billed complements central and obstructive, but in a distribution that was more typical for a with apnea.  They were neuropathy related labs sent out and 2011 as well as also to address chronic fatigue and all returned to normal range. The patient clearly has an accentuated AHI by position and during REM sleep. He has been healthy and maintains an active lifestyle is traveling is a nonsmoker nondrinker and has nausea history. The patient responded well to Wellbutrin on only 100 mg daily taken to eliminate or reduce central apneas. During his last visit in 2013 his fatigue score was 20 and his Epworth score 14 points and he had reported one nocturia on average per night.   He estimated his daily sleep time is 7 hours or more. He sometimes naps in the afternoons. This is still the case today:  his Epworth is 11 points, and fatigue score is 24. I would like to add that the patient is on chronic Plaquenil therapy. This was initiated after he developed severe muscle stiffness and relief this is the symptoms within 2-3 weeks. The rigidity was perceived as painful and restricted. 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 Plaquenil  may contribute somewhat to the development of the neuropathy. Wellbutrin has had some positive effect on the level of alertness and daytime and may also be due  discomfort.      Review of Systems: Out of a complete 14 system review, the patient complains of only the following symptoms, and all other reviewed systems are negative.   Intermittent fatigue.   History   Social History  . Marital Status: Married    Spouse Name: N/A    Number of Children: N/A  . Years of Education: N/A   Occupational History  . Not on file.   Social History Main Topics  . Smoking status: Never Smoker   . Smokeless tobacco: Not on file  . Alcohol Use: 1.8 - 3 oz/week    3-5 Glasses of wine per week  . Drug Use: No  . Sexual Activity: Not on file   Other Topics Concern  . Not on file   Social History Narrative  . No narrative on file    Family History  Problem Relation Age of Onset  . Heart failure Mother     Past Medical History  Diagnosis Date  . GERD (gastroesophageal reflux disease)   . Peptic ulcer disease with hemorrhage 1993  . Diverticulosis   . Hemorrhoids   . Hyperlipidemia   . Arthritis   . Cancer 2008    Prostate  . ED (erectile dysfunction)   . BPH (benign prostatic hypertrophy) 2008  . RBBB (right bundle branch block)   . IFG (impaired fasting glucose)   . Vitamin D deficiency   . Osteopenia   . Complex sleep apnea syndrome   . BCC (basal  cell carcinoma)   . Shingles   . Hernia, inguinal     Past Surgical History  Procedure Laterality Date  . Trigger finger release  03/01/2012    Procedure: MINOR RELEASE TRIGGER FINGER/A-1 PULLEY;  Surgeon: [name], MD;  Location: [facility];  Service: Orthopedics;  Laterality: Left;  long finger  . Laminectomy  1984    L 5  . Prostate surgery  12/2006    Robotic  . Cataract extraction, bilateral    . Tonsillectomy  78 years old    Current Outpatient Prescriptions  Medication Sig Dispense Refill  . buPROPion (WELLBUTRIN SR) 150 MG 12 hr tablet Take 1 tablet (150 mg total) by mouth daily.  90 tablet  1  . Calcium Carbonate (CALCIUM 600 PO) Take 600 mg  by mouth daily.      Marland Kitchen co-enzyme Q-10 30 MG capsule Take 30 mg by mouth 3 (three) times daily.      . colesevelam (WELCHOL) 625 MG tablet Take 1,875 mg by mouth 6 (six) times daily.      . diclofenac sodium (VOLTAREN) 1 % GEL Apply topically.      . hydroxychloroquine (PLAQUENIL) 200 MG tablet Take 200 mg by mouth 2 (two) times daily.      . ranitidine (ZANTAC) 150 MG tablet Take 150 mg by mouth 2 (two) times daily.       No current facility-administered medications for this visit.    Allergies as of 01/24/2013 - Review Complete 01/24/2013  Allergen Reaction Noted  . Demerol [meperidine] Nausea Only 03/01/2012    Vitals: BP 117/66  Pulse 86  Resp 18  Ht 6\' 4"  (1.93 m)  Wt 190 lb (86.183 kg)  BMI 23.14 kg/m2 Last Weight:  Wt Readings from Last 1 Encounters:  01/24/13 190 lb (86.183 kg)   Last Height:   Ht Readings from Last 1 Encounters:  01/24/13 6\' 4"  (1.93 m)    Physical exam:  General: The patient is awake, alert and appears not in acute distress. The patient is well groomed. Head: Normocephalic, atraumatic. Neck is supple. Mallampati 2 , neck circumference: 14  Cardiovascular:  Regular rate and rhythm , without  murmurs or carotid bruit, and without distended neck veins. Respiratory: Lungs are clear to auscultation. Skin:  Without evidence of edema, or rash Trunk: BMI is normal .  Neurologic exam : The patient is awake and alert, oriented to place and time.  Memory subjective  described as intact. There is a normal attention span & concentration ability. Speech is fluent without  dysarthria, dysphonia or aphasia.  Mood and affect are appropriate.  Cranial nerves: Pupils are equal and briskly reactive to light. Funduscopic exam without  evidence of pallor or edema. Extraocular movements  in vertical and horizontal planes intact and without nystagmus. Visual fields by finger perimetry are intact. Hearing to finger rub intact.  Facial sensation intact to fine touch.  Facial motor strength is symmetric and tongue and uvula move midline.  Motor exam:    Normal tone and normal muscle bulk and symmetric normal strength in all extremities.  Sensory:  Fine touch, pinprick and vibration were tested in all extremities. Proprioception is  normal.  Coordination: Rapid alternating movements in the fingers/hands is tested and normal. Finger-to-nose maneuver tested and normal without evidence of ataxia, dysmetria or tremor.  Gait and station: Patient walks without assistive device. Strength within normal limits. Stance is stable and normal.  Deep tendon reflexes: in the  upper and lower extremities are symmetric and intact. Babinski maneuver response is downgoing.   Assessment:  After physical and neurologic examination, review of laboratory studies, imaging, neurophysiology testing and pre-existing records, assessment is   1) complex apnea, mild - not on CPAP , on wellbutrin 150 mg XR one in AM.  2) plaquenil for stiffness,  neuropathy.  Coenzyme Q 10 .  Plan:  Treatment plan and additional workup : no changes in therapies.

## 2013-01-24 NOTE — Patient Instructions (Signed)
Peripheral Neuropathy Peripheral neuropathy is a common disorder of your nerves resulting from damage. CAUSES  This disorder may be caused by a disease of the nerves or illness. Many neuropathies have well known causes such as:  Diabetes. This is one of the most common causes.   Uremia.   AIDS.   Nutritional deficiencies.   Other causes include mechanical pressures. These may be from:   Compression.   Injury.   Contusions or bruises.   Fracture or dislocated bones.   Pressure involving the nerves close to the surface. Nerves such as the ulnar, or radial can be injured by prolonged use of crutches.  Other injuries may come from:  Tumor.   Hemorrhage or bleeding into a nerve.   Exposure to cold or radiation.   Certain medicines or toxic substances (rare).   Vascular or collagen disorders such as:   Atherosclerosis.   Systemic lupus erythematosus.   Scleroderma.   Sarcoidosis.   Rheumatoid arthritis.   Polyarteritis nodosa.   A large number of cases are of unknown cause.  SYMPTOMS  Common problems include:  Weakness.   Numbness.   Abnormal sensations (paresthesia) such as:   Burning.   Tickling.   Pricking.   Tingling.   Pain in the arms, hands, legs and/or feet.  TREATMENT  Therapy for this disorder differs depending on the cause. It may vary from medical treatment with medications or physical therapy among others.   For example, therapy for this disorder caused by diabetes involves control of the diabetes.   In cases where a tumor or ruptured disc is the cause, therapy may involve surgery. This would be to remove the tumor or to repair the ruptured disc.   In entrapment or compression neuropathy, treatment may consist of splinting or surgical decompression of the ulnar or median nerves. A common example of entrapment neuropathy is carpal tunnel syndrome. This has become more common because of the increasing use of computers.   Peroneal and  radial compression neuropathies may require avoidance of pressure.   Physical therapy and/or splints may be useful in preventing contractures. This is a condition in which shortened muscles around joints cause abnormal and sometimes painful positioning of the joints.  Document Released: 03/18/2002 Document Revised: 12/08/2010 Document Reviewed: 03/28/2005 [employer] Patient Information 2012 [employer]. 

## 2013-10-09 ENCOUNTER — Telehealth (INDEPENDENT_AMBULATORY_CARE_PROVIDER_SITE_OTHER): Payer: Self-pay

## 2013-10-09 NOTE — Telephone Encounter (Signed)
Pt s/p right inguinal hernia with mesh on 07/24/2012 with Dr Dalbert Batman. Pt states that he has noticed that the incision site is a little more red than normal.  Informe d pt this far out from surgery don't think this would be related to his sx. Pt denies any swelling, tenderness, fevers or chills. Advised pt that site can be red a times and he may feel a spot where they did the repair. Pt states that he is out of town and wants to know if he needs to go to the ER. Advised pt that as long as he is not running any fevers or having excessive pain, I would not think he would need to be seen in the ER. Offered pt a OV but he did not want to to wait for appt.  Pt states that he would like to speak with Dr Dalbert Batman about this matter and would only like advice from Dr Dalbert Batman. Informed pt that he is seeing other pts at this time but I would send him a message making him aware of his concerns. Please Advise.

## 2013-10-10 NOTE — Telephone Encounter (Signed)
Called to inform pt Dr Darrel Hoover recommendations. Pt denies any tenderness or drainage. Pt states that today the area is less red. Advised pt to call our office back if he notices any fevers, chills, swelling or tenderness. Pt verbalized understanding.

## 2013-10-10 NOTE — Telephone Encounter (Signed)
Infection this long postop, I agree, would be unusual. Would not treat with antibiotics based on phone call. Needs to see physician where he is or in our office if there is any tenderness,  drainage, or progression. Offer office visit for exam. It is very difficult to practice medicine by phone.   hmi

## 2013-11-06 ENCOUNTER — Ambulatory Visit (INDEPENDENT_AMBULATORY_CARE_PROVIDER_SITE_OTHER): Payer: Medicare Other | Admitting: General Surgery

## 2013-11-06 ENCOUNTER — Encounter (INDEPENDENT_AMBULATORY_CARE_PROVIDER_SITE_OTHER): Payer: Self-pay | Admitting: General Surgery

## 2013-11-06 VITALS — BP 122/76 | HR 73 | Temp 97.5°F | Resp 16 | Ht 74.0 in | Wt 191.0 lb

## 2013-11-06 DIAGNOSIS — K4090 Unilateral inguinal hernia, without obstruction or gangrene, not specified as recurrent: Secondary | ICD-10-CM

## 2013-11-06 NOTE — Progress Notes (Signed)
Patient ID: Jeffery [name]., male   DOB: Sep 30, 1934, 79 y.o.   MRN: [id number] History: This child underwent open repair RIH hernia with mesh on 07/24/2012. He looked fine at his postop visit. He returned because he noticed a little discoloration of his wound. No pain. No abscess. No drainage. He said the area was almost as big as a quarter and has now almost completely returned to normal.  Exam: This patient looks well. Friendly. Right groin incision is healing uneventfully. There was a small dot in the center of the incision which looked like it might be where a Monocryl suture had eroded. I prepped the wound but on probing cannot find suture.  Assessment: Right inguinal hernia, uneventful recovery following open mesh repair Possible suture granuloma. Healing uneventfully now. No intervention necessary  Plan: patient reassured Return as needed.   Edsel Petrin. Dalbert Batman, M.D., [facility] General and Minimally invasive Surgery Breast and Colorectal Surgery Office:   [phone] Pager:   [phone]

## 2013-11-06 NOTE — Patient Instructions (Signed)
The small area of your right inguinal incision appears to be healing normally. There is no evidence of infection. The hernia repair is intact.  My Link Snuffer is that one of the skin closure sutures eroded through the skin and is now healing  and there should be no long-term consequences.  Return to see Dr. Dalbert Batman as necessary

## 2014-01-28 ENCOUNTER — Encounter (INDEPENDENT_AMBULATORY_CARE_PROVIDER_SITE_OTHER): Payer: Self-pay

## 2014-01-28 ENCOUNTER — Ambulatory Visit (INDEPENDENT_AMBULATORY_CARE_PROVIDER_SITE_OTHER): Payer: Medicare Other | Admitting: Neurology

## 2014-01-28 ENCOUNTER — Encounter: Payer: Self-pay | Admitting: Neurology

## 2014-01-28 VITALS — BP 132/74 | HR 83 | Temp 98.3°F | Resp 12 | Ht 74.0 in | Wt 193.0 lb

## 2014-01-28 DIAGNOSIS — G609 Hereditary and idiopathic neuropathy, unspecified: Secondary | ICD-10-CM

## 2014-01-28 NOTE — Progress Notes (Addendum)
[name]  Provider:  Larey Seat, M D  Referring Provider: Precious Reel, MD Primary Care Physician:  Precious Reel, MD  Chief Complaint  Patient presents with  . RV Sleep/Neuropathy    RM 11, Alone    HPI:  Jeffery [name]. is a 79 y.o. male  Is seen here as a referral/ revisit  from Dr. Virgina Jock for neuropathy.  This meanwhile 79 year old patient of [name]/ Dr. Estanislado Pandy  has followed up since May 2011.  Initially he was referred for a polysomnogram based on chronic fatigue complaints and was diagnosed as complex apnea. The apnea short-billed complements central and obstructive, but in a distribution that was more typical for a with apnea.  They were neuropathy related labs sent out and 2011 as well as also to address chronic fatigue and all returned to normal range. The patient clearly has an accentuated AHI by position and during REM sleep. He has been healthy and maintains an active lifestyle is traveling is a nonsmoker nondrinker and has nausea history. The patient responded well to Wellbutrin on only 100 mg daily taken to eliminate or reduce central apneas. During his last visit in 2013 his fatigue score was 20 and his Epworth score 14 points and he had reported one nocturia on average per night.   He estimated his daily sleep time is 7 hours or more. He sometimes naps in the afternoons. This is still the case today:  his Epworth is 11 points, and fatigue score is 24. I would like to add that the patient is on chronic Plaquenil therapy. This was initiated after he developed severe muscle stiffness and relief this is the symptoms within 2-3 weeks. The rigidity was perceived as painful and restricted. 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 Plaquenil  may contribute somewhat to the development of the neuropathy. Wellbutrin has had some positive effect on the level of alertness and daytime and may also be due  discomfort.      Review of Systems: Out of a complete 14 system review, the patient complains of only the following symptoms, and all other reviewed systems are negative.   Intermittent fatigue.  Epworth 9, FSS 29  Points.    History   Social History  . Marital Status: Married    Spouse Name: N/A    Number of Children: N/A  . Years of Education: N/A   Occupational History  . Not on file.   Social History Main Topics  . Smoking status: Never Smoker   . Smokeless tobacco: Not on file  . Alcohol Use: 1.8 - 3.0 oz/week    3-5 Glasses of wine per week  . Drug Use: No  . Sexual Activity: Not on file   Other Topics Concern  . Not on file   Social History Narrative   Right handed, Retired .Married, 2 kids. Caffeine none.    Family History  Problem Relation Age of Onset  . Heart failure Mother     Past Medical History  Diagnosis Date  . GERD (gastroesophageal reflux disease)   . Peptic ulcer disease with hemorrhage 1993  . Diverticulosis   . Hemorrhoids   . Hyperlipidemia   . Arthritis   . Cancer 2008    Prostate  . ED (erectile dysfunction)   . BPH (benign prostatic hypertrophy) 2008  . RBBB (right bundle branch block)   . IFG (impaired fasting glucose)   . Vitamin D deficiency   .  Osteopenia   . Complex sleep apnea syndrome   . BCC (basal cell carcinoma)   . Shingles   . Hernia, inguinal     Past Surgical History  Procedure Laterality Date  . Trigger finger release  03/01/2012    Procedure: MINOR RELEASE TRIGGER FINGER/A-1 PULLEY;  Surgeon: Cammie Sickle., MD;  Location: [facility];  Service: Orthopedics;  Laterality: Left;  long finger  . Laminectomy  1984    L 5  . Prostate surgery  12/2006    Robotic  . Cataract extraction, bilateral    . Tonsillectomy  79 years old    Current Outpatient Prescriptions  Medication Sig Dispense Refill  . buPROPion (WELLBUTRIN SR) 150 MG 12 hr tablet Take 1 tablet (150 mg total) by mouth daily.   90 tablet  3  . Calcium Carbonate (CALCIUM 600 PO) Take 600 mg by mouth daily.      . cholecalciferol (VITAMIN D) 400 UNITS TABS tablet Take 400 Units by mouth 3 (three) times a week.      . co-enzyme Q-10 30 MG capsule Take 30 mg by mouth 3 (three) times daily.      . colesevelam (WELCHOL) 625 MG tablet Take 1,250 mg by mouth 3 (three) times daily.       . diclofenac sodium (VOLTAREN) 1 % GEL Apply topically as needed.       . hydroxychloroquine (PLAQUENIL) 200 MG tablet Take 200 mg by mouth 2 (two) times daily.      . ranitidine (ZANTAC) 150 MG tablet Take 150 mg by mouth daily.        No current facility-administered medications for this visit.    Allergies as of 01/28/2014 - Review Complete 01/28/2014  Allergen Reaction Noted  . Demerol [meperidine] Nausea Only 03/01/2012    Vitals: BP 132/74  Pulse 83  Temp(Src) 98.3 F (36.8 C) (Oral)  Resp 12  Ht 6\' 2"  (1.88 m)  Wt 193 lb (87.544 kg)  BMI 24.77 kg/m2 Last Weight:  Wt Readings from Last 1 Encounters:  01/28/14 193 lb (87.544 kg)   Last Height:   Ht Readings from Last 1 Encounters:  01/28/14 6\' 2"  (1.88 m)    Physical exam:  General: The patient is awake, alert and appears not in acute distress. The patient is well groomed. Head: Normocephalic, atraumatic. Neck is supple. Mallampati 2 , neck circumference: 14.5 inches.   Cardiovascular:  Regular rate and rhythm, without  murmurs or carotid bruit, and without distended neck veins. Respiratory: Lungs are clear to auscultation. Skin:  Without evidence of edema, or rash Trunk: BMI is normal .  Neurologic exam : The patient is awake and alert, oriented to place and time.   Memory subjective described as intact. There is a normal attention span & concentration ability.  Speech is fluent without  dysarthria, dysphonia or aphasia.  Mood and affect are appropriate.  Cranial nerves: Pupils are equal and briskly reactive to light. Funduscopic exam without  evidence of  pallor or edema. Extraocular movements  in vertical and horizontal planes intact and without nystagmus.  Visual fields by finger perimetry are intact. Hearing to finger rub intact. Facial sensation intact to fine touch.  Facial motor strength is symmetric and tongue and uvula move midline.  Motor exam:  Normal tone and normal muscle bulk and symmetric normal strength in all extremities.  Sensory:  Fine touch, pinprick and vibration were normal. i did not test the planta pedis.   Coordination:  Rapid alternating movements in the fingers/hands is tested and normal.  Finger-to-nose maneuver tested and normal without evidence of ataxia, dysmetria or tremor.  Gait and station: Patient walks without assistive device. Strength within normal limits. Stance is stable and normal.  Deep tendon reflexes: in the  upper and lower extremities are attenuated , symmetric and intact.   Assessment:  After physical and neurologic examination, review of laboratory studies, imaging, neurophysiology testing and pre-existing records, assessment is   1) complex apnea, mild - not on CPAP , on wellbutrin 150 mg XR one in AM.   2) plaquenil for stiffness, neuropathy. Coenzyme Q 10 .  3) one fall in the last 12 month, leaving a swivel chair. Not related to dizziness.   Plan:  Treatment plan and additional workup : no changes in therapies.  He is stabilized, Blood work every 6 month with Dr. Estanislado Pandy.

## 2014-01-28 NOTE — Patient Instructions (Signed)

## 2014-07-29 ENCOUNTER — Other Ambulatory Visit: Payer: Self-pay | Admitting: Dermatology

## 2015-02-05 ENCOUNTER — Ambulatory Visit: Payer: Medicare Other | Admitting: Neurology

## 2015-03-03 ENCOUNTER — Encounter: Payer: Self-pay | Admitting: Neurology

## 2015-03-03 ENCOUNTER — Ambulatory Visit (INDEPENDENT_AMBULATORY_CARE_PROVIDER_SITE_OTHER): Payer: Medicare Other | Admitting: Neurology

## 2015-03-03 VITALS — BP 132/68 | HR 86 | Resp 20 | Ht 74.0 in | Wt 196.0 lb

## 2015-03-03 DIAGNOSIS — G4731 Primary central sleep apnea: Secondary | ICD-10-CM | POA: Diagnosis not present

## 2015-03-03 NOTE — Progress Notes (Signed)
[name]  Provider:  Larey Seat, M D  Referring Provider: Shon Baton, MD Primary Care Physician:  Precious Reel, MD  Chief Complaint  Patient presents with  . Follow-up    f/u fatigue, pt does not remember ever being told he has neuropathy, rm 11, alone    HPI:  Jeffery [name]. is a 80 y.o. male seen here as a revisit  from Dr. Virgina Jock for complex apnea  And questions about neuropathy.  This meanwhile 80 year old patient of [name]/ Dr. Estanislado Pandy has followed up since May 2011.   Initially he was referred for a polysomnogram based on chronic fatigue complaints and was diagnosed as complex apnea.  The apnea short-billed complements central and obstructive, but in a distribution that was more typical for a with apnea.  They were neuropathy related labs sent out and 2011 as well as also to address chronic fatigue and all returned to normal range. The patient clearly has an accentuated AHI by position and during REM sleep. He has been healthy and maintains an active lifestyle is traveling is a nonsmoker nondrinker and has nausea history. The patient responded well to Wellbutrin on only 100 mg daily taken to eliminate or reduce central apneas. During his last visit in 2013 his fatigue score was 20 and his Epworth score 14 points and he had reported one nocturia on average per night.   He estimated his daily sleep time is 7 hours or more. He sometimes naps in the afternoons. This is still the case today:  his Epworth is 11 points, and fatigue score is 24. I would like to add that the patient is on chronic Plaquenil therapy. This was initiated after he developed severe muscle stiffness and relief this is the symptoms within 2-3 weeks. The rigidity was perceived as painful and restricted. 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 Plaquenil  may contribute somewhat to the development of the neuropathy. Wellbutrin has had  some positive effect on the level of alertness and daytime and may also be due discomfort.  03-03-15 [name] reports that he had taken a drug holiday and for about 6 months did not use Plaquenil. As the responds he noted again stiffness in his hands and inflammation. Dr. Estanislado Pandy , his rheumatologist, had placed him on his prednisone taper but then Plaquenil was reinitiated about 3 months ago. I would like to add that the stiffness has never affected his lower extremities or feet and has always been manifested in his hands. He also has 3 trigger fingers. Fatigue is stable. His Epworth score is endorsed at 9 points and his fatigue severity at 34 points. The geriatric depression score was  endorsed at 2 points.    Review of Systems: Out of a complete 14 system review, the patient complains of only the following symptoms, and all other reviewed systems are negative. Intermittent fatigue. Fatigue is stable. His Epworth score is endorsed at 9 points and his fatigue severity at 34 points. The geriatric depression score was  endorsed at 2 points. night blindness.    Social History   Social History  . Marital Status: Married    Spouse Name: N/A  . Number of Children: N/A  . Years of Education: N/A   Occupational History  . Not on file.   Social History Main Topics  . Smoking status: Never Smoker   . Smokeless tobacco: Not on file  . Alcohol Use: 1.8 - 3.0 oz/week  3-5 Glasses of wine per week  . Drug Use: No  . Sexual Activity: Not on file   Other Topics Concern  . Not on file   Social History Narrative   Right handed, Retired .Married, 2 kids. Caffeine none.    Family History  Problem Relation Age of Onset  . Heart failure Mother     Past Medical History  Diagnosis Date  . GERD (gastroesophageal reflux disease)   . Peptic ulcer disease with hemorrhage 1993  . Diverticulosis   . Hemorrhoids   . Hyperlipidemia   . Arthritis   . Cancer [facility]) 2008    Prostate  . ED  (erectile dysfunction)   . BPH (benign prostatic hypertrophy) 2008  . RBBB (right bundle branch block)   . IFG (impaired fasting glucose)   . Vitamin D deficiency   . Osteopenia   . Complex sleep apnea syndrome   . BCC (basal cell carcinoma)   . Shingles   . Hernia, inguinal     Past Surgical History  Procedure Laterality Date  . Trigger finger release  03/01/2012    Procedure: MINOR RELEASE TRIGGER FINGER/A-1 PULLEY;  Surgeon: Cammie Sickle., MD;  Location: [facility];  Service: Orthopedics;  Laterality: Left;  long finger  . Laminectomy  1984    L 5  . Prostate surgery  12/2006    Robotic  . Cataract extraction, bilateral    . Tonsillectomy  80 years old    Current Outpatient Prescriptions  Medication Sig Dispense Refill  . Calcium Carbonate (CALCIUM 600 PO) Take 600 mg by mouth daily.    . cholecalciferol (VITAMIN D) 400 UNITS TABS tablet Take 400 Units by mouth 3 (three) times a week.    . co-enzyme Q-10 30 MG capsule Take 100 mg by mouth 2 (two) times daily.     . colesevelam (WELCHOL) 625 MG tablet Take 1,250 mg by mouth 3 (three) times daily.     . diclofenac sodium (VOLTAREN) 1 % GEL Apply topically as needed.     . hydroxychloroquine (PLAQUENIL) 200 MG tablet Take 200 mg by mouth 2 (two) times daily.    Marland Kitchen omega-3 acid ethyl esters (LOVAZA) 1 G capsule Take 1 g by mouth 2 (two) times daily.    Marland Kitchen UNABLE TO FIND Med Name: Tumeric    . vitamin E 100 UNIT capsule Take 100 Units by mouth daily.     No current facility-administered medications for this visit.    Allergies as of 03/03/2015 - Review Complete 03/03/2015  Allergen Reaction Noted  . Demerol [meperidine] Nausea Only 03/01/2012    Vitals: BP 132/68 mmHg  Pulse 86  Resp 20  Ht 6\' 2"  (1.88 m)  Wt 196 lb (88.905 kg)  BMI 25.15 kg/m2 Last Weight:  Wt Readings from Last 1 Encounters:  03/03/15 196 lb (88.905 kg)   Last Height:   Ht Readings from Last 1 Encounters:  03/03/15 6\' 2"   (1.88 m)    Physical exam:  General: The patient is awake, alert and appears not in acute distress. The patient is well groomed. Head: Normocephalic, atraumatic. Neck is supple. Mallampati 2 , neck circumference: 14.5 inches.   Cardiovascular:  Regular rate and rhythm, without  murmurs or carotid bruit, and without distended neck veins. Respiratory: Lungs are clear to auscultation. Skin:  Without evidence of edema, or rash Trunk: BMI is normal.  Neurologic exam : The patient is awake and alert, oriented to place and time.  Memory subjective described as intact. There is a normal attention span & concentration ability.  Speech is fluent without dysarthria, dysphonia or aphasia.  Mood and affect are appropriate.  Cranial nerves: Pupils are equal and briskly reactive to light. Funduscopic exam without  evidence of pallor or edema.  Extraocular movements  in vertical and horizontal planes intact and without nystagmus.  Visual fields by finger perimetry are intact. Hearing to finger rub intact. Facial sensation intact to fine touch.  Facial motor strength is symmetric and tongue and uvula move midline.  Motor exam:  Normal tone and normal muscle bulk and symmetric normal strength in all extremities.  Sensory:  Fine touch, pinprick and vibration were normal. i did not test the planta pedis.   Coordination: Rapid alternating movements in the fingers/hands is tested and normal.  Finger-to-nose maneuver tested and normal without evidence of ataxia, dysmetria or tremor.  Gait and station: Patient walks without assistive device. Strength within normal limits. Stance is stable and normal.  Deep tendon reflexes: in the  upper and lower extremities are attenuated , symmetric and intact.   Assessment:  After physical and neurologic examination, review of laboratory studies, imaging, neurophysiology testing and pre-existing records, assessment is   1) complex apnea, mild - not on CPAP , on  wellbutrin 150 mg XR one in AM.  Doing subjectively well.   2) plaquenil for stiffness, followed by rheumatology, takes   Coenzyme Q 10 .  3) undetectable PSA.   Plan:  Treatment plan and additional workup : no changes in therapies.  He is stabilized, Blood work every 6 month with Dr. Estanislado Pandy.   Rv in 12 month , Epworth .    Lemarcus Baggerly, MD   CC Dyke Brackett, MD and Shon Baton, MD

## 2015-03-16 ENCOUNTER — Encounter (HOSPITAL_COMMUNITY): Payer: Self-pay | Admitting: Emergency Medicine

## 2015-03-16 ENCOUNTER — Emergency Department (INDEPENDENT_AMBULATORY_CARE_PROVIDER_SITE_OTHER)
Admission: EM | Admit: 2015-03-16 | Discharge: 2015-03-16 | Disposition: A | Discharge status: Home / Self Care | Payer: Medicare Other | Source: Home / Self Care | Attending: Family Medicine | Admitting: Family Medicine

## 2015-03-16 ENCOUNTER — Emergency Department (INDEPENDENT_AMBULATORY_CARE_PROVIDER_SITE_OTHER): Payer: Medicare Other

## 2015-03-16 DIAGNOSIS — S0181XA Laceration without foreign body of other part of head, initial encounter: Secondary | ICD-10-CM | POA: Diagnosis not present

## 2015-03-16 DIAGNOSIS — S0083XA Contusion of other part of head, initial encounter: Secondary | ICD-10-CM | POA: Diagnosis not present

## 2015-03-16 DIAGNOSIS — S0081XA Abrasion of other part of head, initial encounter: Secondary | ICD-10-CM | POA: Diagnosis not present

## 2015-03-16 DIAGNOSIS — W1839XA Other fall on same level, initial encounter: Secondary | ICD-10-CM | POA: Diagnosis not present

## 2015-03-16 DIAGNOSIS — S62502A Fracture of unspecified phalanx of left thumb, initial encounter for closed fracture: Secondary | ICD-10-CM

## 2015-03-16 DIAGNOSIS — W010XXA Fall on same level from slipping, tripping and stumbling without subsequent striking against object, initial encounter: Secondary | ICD-10-CM

## 2015-03-16 DIAGNOSIS — L089 Local infection of the skin and subcutaneous tissue, unspecified: Secondary | ICD-10-CM

## 2015-03-16 MED ORDER — HYDROCODONE-ACETAMINOPHEN 5-325 MG PO TABS: 1.0000 | ORAL_TABLET | ORAL | Status: DC | PRN

## 2015-03-16 MED ORDER — BACITRACIN ZINC 500 UNIT/GM EX OINT
TOPICAL_OINTMENT | CUTANEOUS | Status: AC
  Filled 2015-03-16: qty 0.9

## 2015-03-16 NOTE — Discharge Instructions (Signed)
Abrasion An abrasion is a cut or scrape on the outer surface of your skin. An abrasion does not extend through all of the layers of your skin. It is important to care for your abrasion properly to prevent infection. CAUSES Most abrasions are caused by falling on or gliding across the ground or another surface. When your skin rubs on something, the outer and inner layer of skin rubs off.  SYMPTOMS A cut or scrape is the main symptom of this condition. The scrape may be bleeding, or it may appear red or pink. If there was an associated fall, there may be an underlying bruise. DIAGNOSIS An abrasion is diagnosed with a physical exam. TREATMENT Treatment for this condition depends on how large and deep the abrasion is. Usually, your abrasion will be cleaned with water and mild soap. This removes any dirt or debris that may be stuck. An antibiotic ointment may be applied to the abrasion to help prevent infection. A bandage (dressing) may be placed on the abrasion to keep it clean. You may also need a tetanus shot. HOME CARE INSTRUCTIONS Medicines  Take or apply medicines only as directed by your health care provider.  If you were prescribed an antibiotic ointment, finish all of it even if you start to feel better. Wound Care  Clean the wound with mild soap and water 2-3 times per day or as directed by your health care provider. Pat your wound dry with a clean towel. Do not rub it.  There are many different ways to close and cover a wound. Follow instructions from your health care provider about:  Wound care.  Dressing changes and removal.  Check your wound every day for signs of infection. Watch for:  Redness, swelling, or pain.  Fluid, blood, or pus. General Instructions  Keep the dressing dry as directed by your health care provider. Do not take baths, swim, use a hot tub, or do anything that would put your wound underwater until your health care provider approves.  If there is  swelling, raise (elevate) the injured area above the level of your heart while you are sitting or lying down.  Keep all follow-up visits as directed by your health care provider. This is important. SEEK MEDICAL CARE IF:  You received a tetanus shot and you have swelling, severe pain, redness, or bleeding at the injection site.  Your pain is not controlled with medicine.  You have increased redness, swelling, or pain at the site of your wound. SEEK IMMEDIATE MEDICAL CARE IF:  You have a red streak going away from your wound.  You have a fever.  You have fluid, blood, or pus coming from your wound.  You notice a bad smell coming from your wound or your dressing.   This information is not intended to replace advice given to you by your health care provider. Make sure you discuss any questions you have with your health care provider.   Document Released: 01/05/2005 Document Revised: 12/17/2014 Document Reviewed: 03/26/2014 Elsevier Interactive Patient Education 2016 [place] or Splint Care Casts and splints support injured limbs and keep bones from moving while they heal. It is important to care for your cast or splint at home.  HOME CARE INSTRUCTIONS  Keep the cast or splint uncovered during the drying period. It can take 24 to 48 hours to dry if it is made of plaster. A fiberglass cast will dry in less than 1 hour.  Do not rest the cast on  anything harder than a pillow for the first 24 hours.  Do not put weight on your injured limb or apply pressure to the cast until your health care provider gives you permission.  Keep the cast or splint dry. Wet casts or splints can lose their shape and may not support the limb as well. A wet cast that has lost its shape can also create harmful pressure on your skin when it dries. Also, wet skin can become infected.  Cover the cast or splint with a plastic bag when bathing or when out in the rain or snow. If the cast is on the trunk  of the body, take sponge baths until the cast is removed.  If your cast does become wet, dry it with a towel or a blow dryer on the cool setting only.  Keep your cast or splint clean. Soiled casts may be wiped with a moistened cloth.  Do not place any hard or soft foreign objects under your cast or splint, such as cotton, toilet paper, lotion, or powder.  Do not try to scratch the skin under the cast with any object. The object could get stuck inside the cast. Also, scratching could lead to an infection. If itching is a problem, use a blow dryer on a cool setting to relieve discomfort.  Do not trim or cut your cast or remove padding from inside of it.  Exercise all joints next to the injury that are not immobilized by the cast or splint. For example, if you have a long leg cast, exercise the hip joint and toes. If you have an arm cast or splint, exercise the shoulder, elbow, thumb, and fingers.  Elevate your injured arm or leg on 1 or 2 pillows for the first 1 to 3 days to decrease swelling and pain.It is best if you can comfortably elevate your cast so it is higher than your heart. SEEK MEDICAL CARE IF:   Your cast or splint cracks.  Your cast or splint is too tight or too loose.  You have unbearable itching inside the cast.  Your cast becomes wet or develops a soft spot or area.  You have a bad smell coming from inside your cast.  You get an object stuck under your cast.  Your skin around the cast becomes red or raw.  You have new pain or worsening pain after the cast has been applied. SEEK IMMEDIATE MEDICAL CARE IF:   You have fluid leaking through the cast.  You are unable to move your fingers or toes.  You have discolored (blue or white), cool, painful, or very swollen fingers or toes beyond the cast.  You have tingling or numbness around the injured area.  You have severe pain or pressure under the cast.  You have any difficulty with your breathing or have shortness  of breath.  You have chest pain.   This information is not intended to replace advice given to you by your health care provider. Make sure you discuss any questions you have with your health care provider.   Document Released: 03/25/2000 Document Revised: 01/16/2013 Document Reviewed: 10/04/2012 Elsevier Interactive Patient Education 2016 [place].   Facial or Scalp Contusion A facial or scalp contusion is a deep bruise on the face or head. Injuries to the face and head generally cause a lot of swelling, especially around the eyes. Contusions are the result of an injury that caused bleeding under the skin. The contusion may turn blue, purple, or yellow.  Minor injuries will give you a painless contusion, but more severe contusions may stay painful and swollen for a few weeks.  CAUSES  A facial or scalp contusion is caused by a blunt injury or trauma to the face or head area.  SIGNS AND SYMPTOMS   Swelling of the injured area.   Discoloration of the injured area.   Tenderness, soreness, or pain in the injured area.  DIAGNOSIS  The diagnosis can be made by taking a medical history and doing a physical exam. An X-ray exam, CT scan, or MRI may be needed to determine if there are any associated injuries, such as broken bones (fractures). TREATMENT  Often, the best treatment for a facial or scalp contusion is applying cold compresses to the injured area. Over-the-counter medicines may also be recommended for pain control.  HOME CARE INSTRUCTIONS   Only take over-the-counter or prescription medicines as directed by your health care provider.   Apply ice to the injured area.   Put ice in a plastic bag.   Place a towel between your skin and the bag.   Leave the ice on for 20 minutes, 2-3 times a day.  SEEK MEDICAL CARE IF:  You have bite problems.   You have pain with chewing.   You are concerned about facial defects. SEEK IMMEDIATE MEDICAL CARE IF:  You have severe  pain or a headache that is not relieved by medicine.   You have unusual sleepiness, confusion, or personality changes.   You throw up (vomit).   You have a persistent nosebleed.   You have double vision or blurred vision.   You have fluid drainage from your nose or ear.   You have difficulty walking or using your arms or legs.  MAKE SURE YOU:   Understand these instructions.  Will watch your condition.  Will get help right away if you are not doing well or get worse.   This information is not intended to replace advice given to you by your health care provider. Make sure you discuss any questions you have with your health care provider.   Document Released: 05/05/2004 Document Revised: 04/18/2014 Document Reviewed: 11/08/2012 Elsevier Interactive Patient Education 2016 [place] Released: 07/10/2000 Document Revised: 01/16/2013 Document Reviewed: 11/07/2012 Elsevier Interactive Patient Education 2016 [employer]  Facial Laceration A facial laceration is a cut on the face. These injuries can be painful and cause bleeding. Some cuts may need to be closed with stitches (sutures), skin adhesive strips, or wound glue. Cuts usually heal quickly but can leave a scar. It can take 1-2 years for the scar to go away completely. HOME CARE   Only take medicines as told by your doctor.  Follow your doctor's instructions for wound care. For Stitches:  Keep the cut clean and dry.  If you have a bandage (dressing), change it at least once a day. Change the bandage if it gets wet or dirty, or as told by your doctor.  Wash the cut with soap and water 2 times a day. Rinse the cut with water. Pat it dry with a clean towel.  Put a thin layer of medicated cream on the cut as told by your doctor.  You may shower after the first 24 hours. Do not soak the cut in water until the stitches are removed.  Have your stitches removed as told by your doctor.  Do not wear any  makeup until a few days after your stitches are removed. For Skin Adhesive  Strips:  Keep the cut clean and dry.  Do not get the strips wet. You may take a bath, but be careful to keep the cut dry.  If the cut gets wet, pat it dry with a clean towel.  The strips will fall off on their own. Do not remove the strips that are still stuck to the cut. For Wound Glue:  You may shower or take baths. Do not soak or scrub the cut. Do not swim. Avoid heavy sweating until the glue falls off on its own. After a shower or bath, pat the cut dry with a clean towel.  Do not put medicine or makeup on your cut until the glue falls off.  If you have a bandage, do not put tape over the glue.  Avoid lots of sunlight or tanning lamps until the glue falls off.  The glue will fall off on its own in 5-10 days. Do not pick at the glue. After Healing:  Put sunscreen on the cut for the first year to reduce your scar. GET HELP IF:  You have a fever. GET HELP RIGHT AWAY IF:   Your cut area gets red, painful, or puffy (swollen).  You see a yellowish-white fluid (pus) coming from the cut.   This information is not intended to replace advice given to you by your health care provider. Make sure you discuss any questions you have with your health care provider.   Document Released: 09/14/2007 Document Revised: 04/18/2014 Document Reviewed: 11/08/2012 Elsevier Interactive Patient Education 2016 [place] Injury, Adult You have a head injury. Headaches and throwing up (vomiting) are common after a head injury. It should be easy to wake up from sleeping. Sometimes you must stay in the hospital. Most problems happen within the first 24 hours. Side effects may occur up to 7-10 days after the injury.  WHAT ARE THE TYPES OF HEAD INJURIES? Head injuries can be as minor as a bump. Some head injuries can be more severe. More severe head injuries include:  A jarring injury to the brain (concussion).  A  bruise of the brain (contusion). This mean there is bleeding in the brain that can cause swelling.  A cracked skull (skull fracture).  Bleeding in the brain that collects, clots, and forms a bump (hematoma). WHEN SHOULD I GET HELP RIGHT AWAY?   You are confused or sleepy.  You cannot be woken up.  You feel sick to your stomach (nauseous) or keep throwing up (vomiting).  Your dizziness or unsteadiness is getting worse.  You have very bad, lasting headaches that are not helped by medicine. Take medicines only as told by your doctor.  You cannot use your arms or legs like normal.  You cannot walk.  You notice changes in the black spots in the center of the colored part of your eye (pupil).  You have clear or bloody fluid coming from your nose or ears.  You have trouble seeing. During the next 24 hours after the injury, you must stay with someone who can watch you. This person should get help right away (call 911 in the [place] if you start to shake and are not able to control it (have seizures), you pass out, or you are unable to wake up. HOW CAN I PREVENT A HEAD INJURY IN THE FUTURE?  Wear seat belts.  Wear a helmet while bike riding and playing sports like football.  Stay away from dangerous activities around the house. WHEN CAN I  RETURN TO NORMAL ACTIVITIES AND ATHLETICS? See your doctor before doing these activities. You should not do normal activities or play contact sports until 1 week after the following symptoms have stopped:  Headache that does not go away.  Dizziness.  Poor attention.  Confusion.  Memory problems.  Sickness to your stomach or throwing up.  Tiredness.  Fussiness.  Bothered by bright lights or loud noises.  Anxiousness or depression.  Restless sleep. MAKE SURE YOU:   Understand these instructions.  Will watch your condition.  Will get help right away if you are not doing well or get worse.   This information is not intended to  replace advice given to you by your health care provider. Make sure you discuss any questions you have with your health care provider.   Document Released: 03/10/2008 Document Revised: 04/18/2014 Document Reviewed: 12/03/2012 Elsevier Interactive Patient Education 2016 [employer]  Thumb Fracture A thumb fracture is a break in one of the two bones of your thumb. The thumb bone that goes from the tip of your thumb to the first joint in your thumb is called the distal phalanx. The thumb bone that goes from the first joint to the joint at the base of your thumb is called the proximal phalanx. Breaks that occur at the joints of your thumb are harder to treat. A broken thumb is more serious than a break in one of your other fingers because you need your thumb for grasping. Thumb fractures are also more likely to lead to pain and stiffness years after healing (arthritis). CAUSES Thumb fractures may be caused by:  A direct blow to your thumb.  Stress on your thumb from it being pulled out of place. These types of injuries often happen as a result of:  Car accidents.  Bicycle accidents.  Falling with your hand outstretched.  Participating in sports such as wrestling, hockey, football, or skiing. RISK FACTORS You may be more likely to break your thumb if you have a condition that causes your bones to become thin and brittle (osteoporosis). SIGNS AND SYMPTOMS The most common symptom is severe pain at the fracture site. Other signs and symptoms may include:  Swelling.  Bruising.  Not being able to move the thumb.  An abnormal shape of the thumb (deformity).  Numbness or coldness.  A red, black, or blue thumbnail. DIAGNOSIS Your health care provider may suspect a thumb fracture if you recently injured your thumb and have signs and symptoms of a fracture. An X-ray of your thumb may be done to confirm the diagnosis and determine how bad the break is. TREATMENT A thumb fracture should  be treated as soon as possible. You may need to wear a padded splint to keep your thumb from moving and to protect your thumb until you can get a cast or have surgery. Treatment options include:  Immobilization.  A cast or splint is put on the injured area without changing the position of the broken bone.  You may have to wear a type of cast called a spica cast or hitchhiker cast to hold the thumb in the proper position.  A cast is usually left on for 4-6 weeks.  Closed reduction.  In this procedure, the bones are put back into position without surgery.  Open reduction and internal fixation (ORIF). This is a surgical procedure.  First, the fracture site is opened up.  Then, the bone pieces are fixed into place with metal screws, plates, or wires.  External  fixation.  In this type of open reduction, the fracture is held in place by metal pins.  The pins are attached to a stabilizing bar outside your skin.  You may need to wear a cast after surgery for up to 6 weeks.  You may need to return for X-rays to make sure your thumb is healing properly.  After your cast is taken off, you may need to do hand exercises (physical therapy) to get movement back in your thumb.  It may take another 3 months to regain complete use of your thumb. HOME CARE INSTRUCTIONS  Take medicines only as directed by your health care provider.  Keep your hand elevated above the level of your heart when resting.  Keep your cast dry when bathing. Cover it with a plastic bag as directed by your health care provider.  After your cast is removed, exercise your thumb at home. Your health care provider may suggest that you:  Move your thumb in circles.  Touch your thumb to your little finger.  Do these exercises several times a day.  Ask your health care provider whether you can use a hand exerciser to strengthen your muscles.  If your thumb feels stiff while you are exercising it, try doing the  exercises while soaking your hand in warm water.  Keep all follow-up visits as directed by your health care provider. This is important. SEEK MEDICAL CARE IF:  You have more than a small spot of bleeding from under your cast or splint.  Your pain medicine is not helping.  You have a fever.  You have numbness or tingling in the injured area.  Your cast becomes loose or damaged.  You notice a bad odor or discharge coming from under your cast. SEEK IMMEDIATE MEDICAL CARE IF:  You have pain that is very bad or getting worse.  You lose feeling in your thumb.  Your thumb turns pale or blue.  Your thumb feels cold.  You have drainage, redness, or swelling at the injury site. MAKE SURE YOU:  Understand these instructions.  Will watch your condition.  Will get help right away if you are not doing well or get worse.   This information is not intended to replace advice given to you by your health care provider. Make sure you discuss any questions you have with your health care provider.   Document Released: 12/25/2002 Document Revised: 12/17/2014 Document Reviewed: 05/31/2013 Elsevier Interactive Patient Education [employer].

## 2015-03-16 NOTE — ED Provider Notes (Signed)
CSN: [id number]     Arrival date & time 03/16/15  1614 History   First MD Initiated Contact with Patient 03/16/15 1756     Chief Complaint  Patient presents with  . Fall   (Consider location/radiation/quality/duration/timing/severity/associated sxs/prior Treatment) HPI Comments: 80 year old male who considers himself to be in generally good health was walking briskly today on an uneven surface and believes he tripped and fell. He struck his left forehead on the cement. He also received abrasions to his hands and knees. These are small and quite superficial. He is complaining of pain to the left thumb with tenderness and inability to fully flex. He states he does not believe there was any loss of consciousness. With some assistance which was nearby he was able to get up to his feet and walk. The EMS arrived and talk with him, examined him and advised him to be seen at a medical facility. Patient states he remembers the events of the accident as well as the sequelae. He is fully alert and oriented. Denies problems with vision, speech, hearing, swallowing. He is quite talkative. He has no dizziness or headache problems with memory or concentration. Tdap 3 y ago.   Past Medical History  Diagnosis Date  . GERD (gastroesophageal reflux disease)   . Peptic ulcer disease with hemorrhage 1993  . Diverticulosis   . Hemorrhoids   . Hyperlipidemia   . Arthritis   . Cancer [facility]) 2008    Prostate  . ED (erectile dysfunction)   . BPH (benign prostatic hypertrophy) 2008  . RBBB (right bundle branch block)   . IFG (impaired fasting glucose)   . Vitamin D deficiency   . Osteopenia   . Complex sleep apnea syndrome   . BCC (basal cell carcinoma)   . Shingles   . Hernia, inguinal    Past Surgical History  Procedure Laterality Date  . Trigger finger release  03/01/2012    Procedure: MINOR RELEASE TRIGGER FINGER/A-1 PULLEY;  Surgeon: Cammie Sickle., MD;  Location: [place];   Service: Orthopedics;  Laterality: Left;  long finger  . Laminectomy  1984    L 5  . Prostate surgery  12/2006    Robotic  . Cataract extraction, bilateral    . Tonsillectomy  80 years old   Family History  Problem Relation Age of Onset  . Heart failure Mother    Social History  Substance Use Topics  . Smoking status: Never Smoker   . Smokeless tobacco: None  . Alcohol Use: 1.8 - 3.0 oz/week    3-5 Glasses of wine per week    Review of Systems  Constitutional: Negative for fever, activity change and fatigue.  HENT: Negative for congestion, dental problem, ear discharge, ear pain and trouble swallowing.   Eyes: Negative for photophobia, pain, discharge, redness and visual disturbance.  Cardiovascular: Negative.   Gastrointestinal: Negative.   Genitourinary: Negative.   Musculoskeletal: Negative for back pain, joint swelling, arthralgias, gait problem, neck pain and neck stiffness.  Skin: Positive for wound.  Neurological: Negative for dizziness, tremors, syncope, facial asymmetry, speech difficulty, numbness and headaches.  Psychiatric/Behavioral: Negative for behavioral problems, confusion, self-injury and agitation.    Allergies  Demerol  Home Medications   Prior to Admission medications   Medication Sig Start Date End Date Taking? Authorizing Provider  Calcium Carbonate (CALCIUM 600 PO) Take 600 mg by mouth daily.    Historical Provider, MD  cholecalciferol (VITAMIN D) 400 UNITS TABS tablet Take 400 Units  by mouth 3 (three) times a week.    Historical Provider, MD  co-enzyme Q-10 30 MG capsule Take 100 mg by mouth 2 (two) times daily.     Historical Provider, MD  colesevelam (WELCHOL) 625 MG tablet Take 1,250 mg by mouth 3 (three) times daily.     Historical Provider, MD  diclofenac sodium (VOLTAREN) 1 % GEL Apply topically as needed.     Historical Provider, MD  HYDROcodone-acetaminophen (NORCO/VICODIN) 5-325 MG tablet Take 1 tablet by mouth every 4 (four) hours as  needed. 03/16/15   Janne Napoleon, NP  hydroxychloroquine (PLAQUENIL) 200 MG tablet Take 200 mg by mouth 2 (two) times daily.    Historical Provider, MD  omega-3 acid ethyl esters (LOVAZA) 1 G capsule Take 1 g by mouth 2 (two) times daily.    Historical Provider, MD  UNABLE TO FIND Med Name: Tumeric    Historical Provider, MD  vitamin E 100 UNIT capsule Take 100 Units by mouth daily.    Historical Provider, MD   Meds Ordered and Administered this Visit  Medications - No data to display  BP 153/85 mmHg  Pulse 87  Temp(Src) 98.9 F (37.2 C) (Oral)  Resp 16  SpO2 96% No data found.   Physical Exam  Constitutional: He is oriented to person, place, and time. He appears well-developed and well-nourished. No distress.  HENT:  Head: Normocephalic and atraumatic.  Bilateral TMs are normal, no hemotympanum. There is a 2 cm superficial laceration over the left eyebrow. Adjacent to the laceration is a superficial skin avulsion and abrasions. No oral/intraoral injury. Superficial abrasion to the bridge of the nose. No tenderness or mobility to the nasal bone. No swelling within the nostrils. No epistaxis.  Eyes: Conjunctivae and EOM are normal. Pupils are equal, round, and reactive to light.  Neck: Normal range of motion. Neck supple.  Full range of motion of the neck. No spinal tenderness, deformity, swelling or discoloration.  Cardiovascular: Normal rate, regular rhythm, normal heart sounds and intact distal pulses.   Pulmonary/Chest: Effort normal and breath sounds normal. No respiratory distress. He has no wheezes. He has no rales. He exhibits no tenderness.  Musculoskeletal: Normal range of motion. He exhibits no edema or tenderness.  Left thumb with tenderness to the distal and proximal phalanges as well as the IP joint. Mild swelling. No tenderness to the back muscles. No tenderness, swelling or deformity to the thoracic or lumbosacral spine.  Lymphadenopathy:    He has no cervical  adenopathy.  Neurological: He is alert and oriented to person, place, and time. He displays no tremor. No cranial nerve deficit or sensory deficit. He exhibits normal muscle tone. Coordination and gait normal. GCS eye subscore is 4. GCS verbal subscore is 5. GCS motor subscore is 6.  Gait smooth and balanced.  Skin: Skin is warm and dry. No rash noted. He is not diaphoretic.  Psychiatric: He has a normal mood and affect. His behavior is normal.  Nursing note and vitals reviewed.   ED Course  .Marland KitchenLaceration Repair Date/Time: 03/16/2015 6:41 PM Performed by: Marcha Dutton, Karlene Southard Authorized by: Ihor Gully D Consent: Verbal consent obtained. Risks and benefits: risks, benefits and alternatives were discussed Consent given by: patient Patient understanding: patient states understanding of the procedure being performed Patient identity confirmed: verbally with patient Body area: head/neck Location details: forehead Laceration length: 2 cm Foreign bodies: no foreign bodies Tendon involvement: none Nerve involvement: none Vascular damage: no Patient sedated: no Preparation: Patient was prepped and  draped in the usual sterile fashion. Irrigation solution: saline Irrigation method: tap Amount of cleaning: standard Debridement: none Degree of undermining: none Skin closure: glue Technique: simple Approximation: close Approximation difficulty: simple Patient tolerance: Patient tolerated the procedure well with no immediate complications Comments: Saline wound cleanse irrigation copiously to laceration to forehead. Same to abrasions   (including critical care time)  Labs Review Labs Reviewed - No data to display  Imaging Review Dg Finger Thumb Left  03/16/2015  CLINICAL DATA:  Pt fell today while taking a walk, face forward onto both hands bruising his Lt thumb, can bend the thumb slightly, thumb is swollen and bruised EXAM: LEFT THUMB 2+V COMPARISON:  None. FINDINGS: Transverse fracture  across the base of the distal phalanx. I do not see definite extension to the subchondral cortex nor significant step-off deformity. Fracture fragments are in near-anatomic alignment. No significant osseous degenerative change. No other bone abnormality is identified. IMPRESSION: 1. Minimally displaced fracture, base distal phalanx left thumb. Electronically Signed   By: Lucrezia Europe M.D.   On: 03/16/2015 19:02     Visual Acuity Review  Right Eye Distance:   Left Eye Distance:   Bilateral Distance:    Right Eye Near:   Left Eye Near:    Bilateral Near:         MDM   1. Fall from slip, trip, or stumble, initial encounter   2. Forehead contusion, initial encounter   3. Laceration of forehead without complication, initial encounter   4. Abrasion of forehead, infected, initial encounter   5. Thumb fracture, left, closed, initial encounter    Patient is discharged in stable condition. He remains fully awake alert and oriented. He has had no confusion or memory problems. No lethargy or other neurologic symptoms. The laceration was repaired by glue closure. The abrasions were cleansed and covered as directed over the forehead. The left thumb is splinted. May use ice over the forehead or swelling and bruising as well as the thumb.  Call the with a orthopedist tomorrow to see in a few days.  Read instructions that were discussed with you verbally regarding signs of head injury. If any such symptoms appear or experienced by the patient for to go to the emergency department or call EMS promptly.  Instructions for care of the wound, laceration, abrasions and splinting are given verbally and written.     Janne Napoleon, NP 03/16/15 [phone]

## 2015-03-16 NOTE — ED Notes (Signed)
Patient reports having a dtp at local drug store

## 2015-03-16 NOTE — ED Notes (Signed)
Patient fell today around 3:30 pm.  Patient does not remember falling to the ground.  Patient not sure if he tripped.  Patient remembers sitting up and wiping blood from face.  Patient has a laceration above left eye ( possibly suturable) abrasions to both knees, left hand.  Pain to left thumb with swelling. Abrasion to left forehead.  Patient's glasses are bent.

## 2015-03-18 LAB — HM DEXA SCAN

## 2015-03-24 ENCOUNTER — Other Ambulatory Visit (HOSPITAL_COMMUNITY): Payer: Self-pay | Admitting: Internal Medicine

## 2015-03-24 DIAGNOSIS — R55 Syncope and collapse: Secondary | ICD-10-CM

## 2015-03-25 ENCOUNTER — Ambulatory Visit (HOSPITAL_COMMUNITY)
Admission: RE | Admit: 2015-03-25 | Discharge: 2015-03-25 | Disposition: A | Discharge status: Home / Self Care | Payer: Medicare Other | Source: Ambulatory Visit | Attending: Cardiology | Admitting: Cardiology

## 2015-03-25 DIAGNOSIS — E785 Hyperlipidemia, unspecified: Secondary | ICD-10-CM | POA: Diagnosis not present

## 2015-03-25 DIAGNOSIS — R55 Syncope and collapse: Secondary | ICD-10-CM | POA: Insufficient documentation

## 2015-03-25 DIAGNOSIS — I6523 Occlusion and stenosis of bilateral carotid arteries: Secondary | ICD-10-CM | POA: Insufficient documentation

## 2015-03-27 ENCOUNTER — Ambulatory Visit (HOSPITAL_COMMUNITY): Payer: Medicare Other | Attending: Cardiology

## 2015-03-27 ENCOUNTER — Telehealth: Payer: Self-pay | Admitting: Neurology

## 2015-03-27 ENCOUNTER — Other Ambulatory Visit: Payer: Self-pay

## 2015-03-27 DIAGNOSIS — I517 Cardiomegaly: Secondary | ICD-10-CM | POA: Insufficient documentation

## 2015-03-27 DIAGNOSIS — I350 Nonrheumatic aortic (valve) stenosis: Secondary | ICD-10-CM | POA: Diagnosis not present

## 2015-03-27 DIAGNOSIS — I77810 Thoracic aortic ectasia: Secondary | ICD-10-CM | POA: Diagnosis not present

## 2015-03-27 DIAGNOSIS — R55 Syncope and collapse: Secondary | ICD-10-CM

## 2015-03-27 DIAGNOSIS — I5189 Other ill-defined heart diseases: Secondary | ICD-10-CM | POA: Diagnosis not present

## 2015-03-27 DIAGNOSIS — I059 Rheumatic mitral valve disease, unspecified: Secondary | ICD-10-CM | POA: Diagnosis not present

## 2015-03-27 NOTE — Telephone Encounter (Signed)
Pt called and had a fall 12/5 and was seen in the ER. He has made an appt for 1/5 but would like to be seen earlier. He is also requesting a call from the physician as soon as possible. He feels that a short talk would help his anxiety until appt. Please call and advise [phone]

## 2015-03-30 NOTE — Telephone Encounter (Signed)
May i see him tomorrow ?

## 2015-03-30 NOTE — Telephone Encounter (Signed)
Made an appt for pt for 12/28. Pt asked me to call him if any sooner appts were made available. Pt still requesting a call from Dr. Brett Fairy.

## 2015-03-30 NOTE — Telephone Encounter (Signed)
Pt is agreeable to coming in tomorrow at 9:30.

## 2015-03-31 ENCOUNTER — Ambulatory Visit (INDEPENDENT_AMBULATORY_CARE_PROVIDER_SITE_OTHER): Payer: Medicare Other | Admitting: Neurology

## 2015-03-31 ENCOUNTER — Encounter: Payer: Self-pay | Admitting: Neurology

## 2015-03-31 VITALS — BP 152/80 | HR 84 | Resp 20 | Ht 74.0 in | Wt 197.0 lb

## 2015-03-31 DIAGNOSIS — G629 Polyneuropathy, unspecified: Secondary | ICD-10-CM

## 2015-03-31 DIAGNOSIS — I4510 Unspecified right bundle-branch block: Secondary | ICD-10-CM

## 2015-03-31 DIAGNOSIS — I450 Right fascicular block: Secondary | ICD-10-CM

## 2015-03-31 NOTE — Progress Notes (Signed)
[name]  Provider:  Larey Seat, M D  Referring Provider: Shon Baton, Jeffery [name] Primary Care Physician:  Precious Reel, Jeffery [name]  Chief Complaint  Patient presents with  . Follow-up    wants to know why he fall, rm 10, alone    HPI:  Jeffery H Jeffery [name]. is a 80 y.o. male seen here as a revisit  from Dr. Virgina Jock for complex apnea  And questions about neuropathy.  This meanwhile 80 year old patient of [name]/ Dr. Estanislado Pandy has followed up since May 2011. Initially he was referred for a polysomnogram based on chronic fatigue complaints and was diagnosed as complex apnea.  The apnea short-billed complements central and obstructive, but in a distribution that was more typical for a with apnea.  They were neuropathy related labs sent out and 2011 as well as also to address chronic fatigue and all returned to normal range. The patient clearly has an accentuated AHI by position and during REM sleep. He has been healthy and maintains an active lifestyle is traveling is a nonsmoker nondrinker and has nausea history. The patient responded well to Wellbutrin on only 100 mg daily taken to eliminate or reduce central apneas. During his last visit in 2013 his fatigue score was 20 and his Epworth score 14 points and he had reported one nocturia on average per night. He estimated his daily sleep time is 7 hours or more. He sometimes naps in the afternoons. This is still the case today:  his Epworth is 11 points, and fatigue score is 24. I would like to add that the patient is on chronic Plaquenil therapy. This was initiated after he developed severe muscle stiffness and relief this is the symptoms within 2-3 weeks. The rigidity was perceived as painful and restricted. 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 Plaquenil  may contribute somewhat to the development of the neuropathy. Wellbutrin has had some positive effect on the level of alertness  and daytime and may also be due discomfort.  03-03-15 [name] reports that he had taken a drug holiday and for about 6 months did not use Plaquenil. As the responds he noted again stiffness in his hands and inflammation. Dr. Estanislado Pandy , his rheumatologist, had placed him on his prednisone taper but then Plaquenil was reinitiated about 3 months ago. I would like to add that the stiffness has never affected his lower extremities or feet and has always been manifested in his hands. He also has 3 trigger fingers. Fatigue is stable. His Epworth score is endorsed at 9 points and his fatigue severity at 34 points. The geriatric depression score was  endorsed at 2 points.   03-31-15 03/31/2015 We are seeing today after [name] suffered a sudden fall with a laceration of the left for head and fracture of the left thumb. He was walking briskly but on an uneven surface and initially believed that he tripped and fell. But he had as sudden premonition to slow down before he fell which tells me that he most likely fainted. He has no history of seizures, he was able to break his fall but had no focal deficits Jeffery [name] is a fall and also did not feel that he had problems was generating words, but his thought process was scrambled or that he had any focal motor deficits. This rules out a TIA.  Once he was on the ground he was immediately conscious and aware of his surroundings- he was not combative and he  will actually waited patiently for EMS to arrive. The ED reports were reviewed here with him  A cardiac workup was initiated and his transthoracic echo cardiogram revealed no abnormalities.  He also underwent carotid Doppler studies which showed minor in my opinion trivial calcifications at the bifurcation. These would be in the range between 1 and 40% the nonsurgical indication range. I would like for the patient to take a baby aspirin is possibly enteric coated once a day should he develop bruising I will like him  to take it every other day. I also discussed with him that we have never established at baseline for his neuropathy. I would like for him as early as available in the new year to undergo an EMG and nerve conduction study to evaluate the degree of peripheral neuropathy and axonal versus demyelinating quality of the neuropathy. The patient is interested.  A recent comprehensive metabolic panel was Jeffery [name]  was interpreted as normal , as well as a CBC diff  which she has every 3 months.  He will leave for [place] on January 11 and I will schedule the test for him prior to his departure.    Review of Systems: Out of a complete 14 system review, the patient complains of only the following symptoms, and all other reviewed systems are negative. Intermittent fatigue. Fatigue is stable. His Epworth score is endorsed at 9 points and his fatigue severity at 34 points. The geriatric depression score was  endorsed at 2 points. night blindness.    Social History   Social History  . Marital Status: Married    Spouse Name: N/A  . Number of Children: N/A  . Years of Education: N/A   Occupational History  . Not on file.   Social History Main Topics  . Smoking status: Never Smoker   . Smokeless tobacco: Not on file  . Alcohol Use: 1.8 - 3.0 oz/week    3-5 Glasses of wine per week  . Drug Use: No  . Sexual Activity: Not on file   Other Topics Concern  . Not on file   Social History Narrative   Right handed, Retired .Married, 2 kids. Caffeine none.    Family History  Problem Relation Age of Onset  . Heart failure Mother     Past Medical History  Diagnosis Date  . GERD (gastroesophageal reflux disease)   . Peptic ulcer disease with hemorrhage 1993  . Diverticulosis   . Hemorrhoids   . Hyperlipidemia   . Arthritis   . Cancer [facility]) 2008    Prostate  . ED (erectile dysfunction)   . BPH (benign prostatic hypertrophy) 2008  . RBBB (right bundle branch block)   . IFG (impaired  fasting glucose)   . Vitamin D deficiency   . Osteopenia   . Complex sleep apnea syndrome   . BCC (basal cell carcinoma)   . Shingles   . Hernia, inguinal     Past Surgical History  Procedure Laterality Date  . Trigger finger release  03/01/2012    Procedure: MINOR RELEASE TRIGGER FINGER/A-1 PULLEY;  Surgeon: Cammie Sickle., Jeffery [name];  Location: [place];  Service: Orthopedics;  Laterality: Left;  long finger  . Laminectomy  1984    L 5  . Prostate surgery  12/2006    Robotic  . Cataract extraction, bilateral    . Tonsillectomy  80 years old    Current Outpatient Prescriptions  Medication Sig Dispense Refill  . Calcium Carbonate (CALCIUM  600 PO) Take 600 mg by mouth daily.    . cholecalciferol (VITAMIN D) 400 UNITS TABS tablet Take 400 Units by mouth daily.     Marland Kitchen co-enzyme Q-10 30 MG capsule Take 200 mg by mouth 2 (two) times daily.     . colesevelam (WELCHOL) 625 MG tablet Take 1,250 mg by mouth 3 (three) times daily.     . diclofenac sodium (VOLTAREN) 1 % GEL Apply topically as needed.     Marland Kitchen HYDROcodone-acetaminophen (NORCO/VICODIN) 5-325 MG tablet Take 1 tablet by mouth every 4 (four) hours as needed. 15 tablet 0  . hydroxychloroquine (PLAQUENIL) 200 MG tablet Take 200 mg by mouth 2 (two) times daily.    Marland Kitchen omega-3 acid ethyl esters (LOVAZA) 1 G capsule Take 1 g by mouth 2 (two) times daily.    Marland Kitchen UNABLE TO FIND Med Name: Tumeric    . vitamin E 100 UNIT capsule Take 100 Units by mouth daily.     No current facility-administered medications for this visit.    Allergies as of 03/31/2015 - Review Complete 03/31/2015  Allergen Reaction Noted  . Demerol [meperidine] Nausea Only 03/01/2012    Vitals: BP 152/80 mmHg  Pulse 84  Resp 20  Ht 6\' 2"  (1.88 m)  Wt 197 lb (89.359 kg)  BMI 25.28 kg/m2 Last Weight:  Wt Readings from Last 1 Encounters:  03/31/15 197 lb (89.359 kg)   Last Height:   Ht Readings from Last 1 Encounters:  03/31/15 6\' 2"  (1.88 m)     Physical exam:  General: The patient is awake, alert and appears not in acute distress. The patient is well groomed. Head: Normocephalic, atraumatic. Neck is supple. Mallampati 2 , neck circumference: 14.5 inches.   Cardiovascular:  Regular rate and rhythm, without  murmurs or carotid bruit, and without distended neck veins. Respiratory: Lungs are clear to auscultation. Skin:  Without evidence of edema, or rash Trunk: BMI is normal.  Neurologic exam : The patient is awake and alert, oriented to place and time.   Memory subjective described as intact. There is a normal attention span & concentration ability.  Speech is fluent without dysarthria, dysphonia or aphasia.  Mood and affect are appropriate.  Cranial nerves: Pupils are equal and briskly reactive to light. Funduscopic exam without  evidence of pallor or edema.  Extraocular movements  in vertical and horizontal planes intact and without nystagmus.  Visual fields by finger perimetry are intact. Hearing to finger rub intact. Facial sensation intact to fine touch.  Facial motor strength is symmetric and tongue and uvula move midline.  Motor exam:  Normal tone and normal muscle bulk and symmetric normal strength in all extremities.  Sensory:  Fine touch, pinprick and vibration were normal. i did not test the planta pedis.   Coordination: Rapid alternating movements in the fingers/hands is tested and normal.  Finger-to-nose maneuver tested and normal without evidence of ataxia, dysmetria or tremor.  Gait and station: Patient walks without assistive device. Strength within normal limits. Stance is stable and normal.  Deep tendon reflexes: in the  upper and lower extremities are attenuated , symmetric and intact.   Assessment:  After physical and neurologic examination, review of laboratory studies, imaging, neurophysiology testing and pre-existing records, assessment is   1) complex apnea, mild - not on CPAP , on wellbutrin  150 mg XR one in AM.  Doing subjectively well.   2) plaquenil for stiffness, followed by rheumatology, takes   Coenzyme Q 10 .  3) undetectable PSA.   Plan:  Treatment plan and additional workup :  EMG and NCV ,  No changes in CPAP therapy.   Blood work every 6 month with Dr. Estanislado Pandy.    Jeffery Rosenau, Jeffery [name]   CC Dyke Brackett, Jeffery [name] and Shon Baton, Jeffery [name]

## 2015-04-08 ENCOUNTER — Ambulatory Visit: Payer: Self-pay | Admitting: Neurology

## 2015-04-14 ENCOUNTER — Ambulatory Visit (INDEPENDENT_AMBULATORY_CARE_PROVIDER_SITE_OTHER): Payer: Medicare Other | Admitting: Neurology

## 2015-04-14 ENCOUNTER — Encounter: Payer: Self-pay | Admitting: Neurology

## 2015-04-14 DIAGNOSIS — G629 Polyneuropathy, unspecified: Secondary | ICD-10-CM | POA: Diagnosis not present

## 2015-04-14 DIAGNOSIS — I4510 Unspecified right bundle-branch block: Secondary | ICD-10-CM

## 2015-04-14 DIAGNOSIS — Z0289 Encounter for other administrative examinations: Secondary | ICD-10-CM

## 2015-04-14 NOTE — Progress Notes (Signed)
   STUDY DATE:    04/14/2015 PATIENT NAME: Jeffery H O'Tuel Jr. DOB: 03-21-1935 MRN: [id number]  HISTORY:   [name] is a 81 year old man undergoing investigation for possible polyneuropathy due to a recent fall.    On examination today, he has normal sensation to vibration , touch and position sensations in his feet and legs. Strength was normal in the legs. DTRs were normal.  NERVE CONDUCTION STUDIES:  Bilateral peroneal and tibial motor responses had normal amplitudes , distal latencies and velocities. The F-wave responses were normal. Bilateral sural sensory responses were normal.    H wave reflexes were normal and symmetric  EMG STUDIES:  Needle EMG of selectmuscles of the right leg was performed. Vastus medialis and the iliopsoas muscles were normal. There were large motor units that recruited neuropathic leg in the peroneus longus and anterior tibialis muscles consistent with moderate chronic denervation.   There were milder chronic denervation changes in the FDIO foot muscle, gastrocnemius and the gluteus medius muscles.    There were no acute changes.    IMPRESSION:  This study is consistent with a moderate chronic right L5 radiculopathy and milder chronic right S1 radiculopathy.   There was not evidence of a superimposed polyneuropathy.   Karolyna Bianchini A. Felecia Shelling, MD, PhD Certified in Neurology, [facility], Sleep Medicine, Pain Medicine and Neuroimaging  [facility] [address] [place], [place] [address] [phone]

## 2015-04-15 ENCOUNTER — Other Ambulatory Visit (HOSPITAL_COMMUNITY): Payer: Medicare Other

## 2015-04-15 ENCOUNTER — Ambulatory Visit (HOSPITAL_COMMUNITY)
Admission: RE | Admit: 2015-04-15 | Discharge: 2015-04-15 | Disposition: A | Discharge status: Home / Self Care | Payer: Medicare Other | Source: Ambulatory Visit | Attending: Rheumatology | Admitting: Rheumatology

## 2015-04-15 ENCOUNTER — Other Ambulatory Visit (HOSPITAL_COMMUNITY): Payer: Self-pay | Admitting: Rheumatology

## 2015-04-15 ENCOUNTER — Other Ambulatory Visit (HOSPITAL_COMMUNITY): Payer: Self-pay | Admitting: Interventional Radiology

## 2015-04-15 DIAGNOSIS — R4020 Unspecified coma: Secondary | ICD-10-CM

## 2015-04-15 DIAGNOSIS — W19XXXA Unspecified fall, initial encounter: Secondary | ICD-10-CM | POA: Insufficient documentation

## 2015-04-15 DIAGNOSIS — R404 Transient alteration of awareness: Secondary | ICD-10-CM | POA: Diagnosis present

## 2015-04-15 DIAGNOSIS — R51 Headache: Secondary | ICD-10-CM | POA: Insufficient documentation

## 2015-04-15 DIAGNOSIS — R519 Headache, unspecified: Secondary | ICD-10-CM

## 2015-04-15 DIAGNOSIS — I6529 Occlusion and stenosis of unspecified carotid artery: Secondary | ICD-10-CM

## 2015-04-15 DIAGNOSIS — G44309 Post-traumatic headache, unspecified, not intractable: Secondary | ICD-10-CM

## 2015-04-15 DIAGNOSIS — S0990XS Unspecified injury of head, sequela: Secondary | ICD-10-CM

## 2015-04-15 DIAGNOSIS — I6320 Cerebral infarction due to unspecified occlusion or stenosis of unspecified precerebral arteries: Secondary | ICD-10-CM

## 2015-04-15 DIAGNOSIS — G319 Degenerative disease of nervous system, unspecified: Secondary | ICD-10-CM | POA: Diagnosis not present

## 2015-04-15 LAB — POCT I-STAT CREATININE: CREATININE: 0.8 mg/dL (ref 0.61–1.24)

## 2015-04-15 MED ORDER — GADOBENATE DIMEGLUMINE 529 MG/ML IV SOLN
20.0000 mL | Freq: Once | INTRAVENOUS | Status: AC | PRN
  Administered 2015-04-15: 19 mL via INTRAVENOUS

## 2015-04-16 ENCOUNTER — Ambulatory Visit: Payer: Medicare Other | Admitting: Neurology

## 2015-07-07 ENCOUNTER — Telehealth: Payer: Self-pay | Admitting: Cardiology

## 2015-07-07 NOTE — Telephone Encounter (Signed)
Received records from [facility] for appointment with Dr Percival Spanish.  Records given to [facility] (medical records) for Dr Hochrein's schedule on 07/16/15. lp

## 2015-07-15 NOTE — Progress Notes (Signed)
Cardiology Office Note   Date:  07/16/2015   ID:  Claud Lewayne Shores., DOB 07/13/1934, MRN [id number]  PCP:  Precious Reel, MD  Cardiologist:   Minus Breeding, MD   Chief Complaint  Patient presents with  . Dizziness       History of Present Illness: Omarian H Kanon Rausch. is a 81 y.o. male who presents for evaluation of dizziness. The patient was evaluated for dizziness and a fall that happened in early December. I reviewed available records including an emergency room evaluation. He did not report loss of consciousness. He was walking. He remembers getting dizzy. He remembers going down. He does not remember hitting the ground but was awake fairly immediately afterwards. He did not have any loss of voice, seizure activity, loss of bowel or bladder. He had no visual disturbances. He had no residual afterwards except from his head wound. He did have an extensive evaluation and I reviewed this. Echocardiogram was unremarkable. There was no obstructive disease and carotid. MRI demonstrated no acute changes. He was seen recently by his neurologist who sees her sleep apnea and he had normal EMG. He has noted to have some atrophy and small vessel disease. He wanted to come see a cardiologist is for follow-up.  He is actually quite active. He walks routinely. He still has a little unsteadiness but he has not fallen or had any syncope. He denies any palpitations, presyncope or syncope. He's had no chest pressure, neck or arm discomfort. He's had no weight gain or edema. He might get mildly dyspneic with moderate exertion but he doesn't have any resting shortness of breath, PND or orthopnea. Aside from a distant stress test he said no significant cardiac workup in the past.    Past Medical History  Diagnosis Date  . GERD (gastroesophageal reflux disease)   . Peptic ulcer disease with hemorrhage 1993  . Diverticulosis   . Hemorrhoids   . Hyperlipidemia   . Arthritis   . Cancer [facility]) 2008   Prostate  . ED (erectile dysfunction)   . BPH (benign prostatic hypertrophy) 2008  . RBBB (right bundle branch block)   . IFG (impaired fasting glucose)   . Vitamin D deficiency   . Osteopenia   . Complex sleep apnea syndrome   . BCC (basal cell carcinoma)   . Shingles   . Hernia, inguinal     Past Surgical History  Procedure Laterality Date  . Trigger finger release  03/01/2012    Procedure: MINOR RELEASE TRIGGER FINGER/A-1 PULLEY;  Surgeon: Cammie Sickle., MD;  Location: [place];  Service: Orthopedics;  Laterality: Left;  long finger  . Laminectomy  1984    L 5  . Prostate surgery  12/2006    Robotic  . Cataract extraction, bilateral    . Tonsillectomy  81 years old     Current Outpatient Prescriptions  Medication Sig Dispense Refill  . Calcium Carbonate (CALCIUM 600 PO) Take 600 mg by mouth daily.    . Cholecalciferol (VITAMIN D3) 2000 units capsule Take 2,000 Units by mouth daily.    . Coenzyme Q10 200 MG capsule Take 200 mg by mouth daily.    . colesevelam (WELCHOL) 625 MG tablet Take 1,250 mg by mouth 3 (three) times daily.     . diclofenac sodium (VOLTAREN) 1 % GEL Apply topically as needed.     Marland Kitchen HYDROcodone-acetaminophen (NORCO/VICODIN) 5-325 MG tablet Take 1 tablet by mouth every 4 (four) hours as  needed. 15 tablet 0  . hydroxychloroquine (PLAQUENIL) 200 MG tablet Take 200 mg by mouth 2 (two) times daily.    Marland Kitchen omega-3 acid ethyl esters (LOVAZA) 1 G capsule Take 1 g by mouth 2 (two) times daily.    Marland Kitchen UNABLE TO FIND Med Name: Tumeric    . vitamin E 100 UNIT capsule Take 100 Units by mouth daily.     No current facility-administered medications for this visit.    Allergies:   Demerol    Social History:  The patient  reports that he has never smoked. He does not have any smokeless tobacco history on file. He reports that he drinks about 1.8 - 3.0 oz of alcohol per week. He reports that he does not use illicit drugs.   Family History:  The  patient's family history includes Congenital heart disease in his brother; Heart failure in his mother.    ROS:  Please see the history of present illness.   Otherwise, review of systems are positive for none.   All other systems are reviewed and negative.    PHYSICAL EXAM: VS:  BP 144/70 mmHg  Pulse 87  Ht 6\' 2"  (1.88 m)  Wt 193 lb 12.8 oz (87.907 kg)  BMI 24.87 kg/m2  SpO2 92% , BMI Body mass index is 24.87 kg/(m^2). GENERAL:  Well appearing HEENT:  Pupils equal round and reactive, fundi not visualized, oral mucosa unremarkable NECK:  No jugular venous distention, waveform within normal limits, carotid upstroke brisk and symmetric, no bruits, no thyromegaly LYMPHATICS:  No cervical, inguinal adenopathy LUNGS:  Clear to auscultation bilaterally BACK:  No CVA tenderness CHEST:  Unremarkable HEART:  PMI not displaced or sustained,S1 and S2 within normal limits, no S3, no S4, no clicks, no rubs, no murmurs ABD:  Flat, positive bowel sounds normal in frequency in pitch, no bruits, no rebound, no guarding, no midline pulsatile mass, no hepatomegaly, no splenomegaly EXT:  2 plus pulses throughout, no edema, no cyanosis no clubbing SKIN:  No rashes no nodules NEURO:  Cranial nerves II through XII grossly intact, motor grossly intact throughout PSYCH:  Cognitively intact, oriented to person place and time    EKG:  EKG is ordered today. The ekg ordered today demonstrates sinus rhythm, right bundle branch block, rate 84, left axis deviation, left anterior fascicular block, no acute ST-T wave changes.   Recent Labs: 04/15/2015: Creatinine, Ser 0.80    Lipid Panel No results found for: CHOL, TRIG, HDL, CHOLHDL, VLDL, LDLCALC, LDLDIRECT    Wt Readings from Last 3 Encounters:  07/16/15 193 lb 12.8 oz (87.907 kg)  04/15/15 195 lb (88.451 kg)  03/31/15 197 lb (89.359 kg)      Other studies Reviewed: Additional studies/ records that were reviewed today include: Office records, ED  records. Review of the above records demonstrates:  Please see elsewhere in the note.     ASSESSMENT AND PLAN:  DIZZINESS:  I doubt that there is a cardiac etiology to this. Long discussion about this. I will evaluate as below but not anticipating further evaluation for possible syncope. He has had no recurrent episodes. He's had an extensive neurologic workup.  SOB:  The patient has minimal cardiovascular risk factors. He might get short of breath with mild activities. Therefore, based on ACC/AHA guidelines, the patient would be at acceptable risk for the planned procedure without further cardiovascular testing.  RBBB:  This is chronic and noted previously. No change in therapy or further evaluation other than above is  indicated.   Current medicines are reviewed at length with the patient today.  The patient does not have concerns regarding medicines.  The following changes have been made:  no change  Labs/ tests ordered today include:   Orders Placed This Encounter  Procedures  . Exercise Tolerance Test  . EKG 12-Lead     Disposition:   FU with me in 18 months or so    Signed, Minus Breeding, MD  07/16/2015 1:16 PM    [facility]

## 2015-07-16 ENCOUNTER — Encounter: Payer: Self-pay | Admitting: Cardiology

## 2015-07-16 ENCOUNTER — Ambulatory Visit (INDEPENDENT_AMBULATORY_CARE_PROVIDER_SITE_OTHER): Payer: Medicare Other | Admitting: Cardiology

## 2015-07-16 VITALS — BP 144/70 | HR 87 | Ht 74.0 in | Wt 193.8 lb

## 2015-07-16 DIAGNOSIS — R0602 Shortness of breath: Secondary | ICD-10-CM

## 2015-07-16 DIAGNOSIS — R42 Dizziness and giddiness: Secondary | ICD-10-CM | POA: Diagnosis not present

## 2015-07-16 DIAGNOSIS — I6529 Occlusion and stenosis of unspecified carotid artery: Secondary | ICD-10-CM

## 2015-07-16 NOTE — Patient Instructions (Signed)
NO CHANGE WITH ANY MEDICATIONS   Your physician has requested that you have an exercise tolerance test. For further information please visit [email or web address]. Please also follow instruction sheet, as given.  WILL NOTIFY YOU WITH RESULTS  Your physician wants you to follow-up in [place].  You will receive a reminder letter in the mail two months in advance. If you don't receive a letter, please call our office to schedule the follow-up appointment.

## 2015-08-04 ENCOUNTER — Telehealth: Payer: Self-pay | Admitting: Cardiology

## 2015-08-04 ENCOUNTER — Telehealth (HOSPITAL_COMMUNITY): Payer: Self-pay

## 2015-08-04 NOTE — Telephone Encounter (Signed)
error 

## 2015-08-04 NOTE — Telephone Encounter (Signed)
Encounter complete. 

## 2015-08-06 ENCOUNTER — Ambulatory Visit (HOSPITAL_COMMUNITY)
Admission: RE | Admit: 2015-08-06 | Discharge: 2015-08-06 | Disposition: A | Discharge status: Home / Self Care | Payer: Medicare Other | Source: Ambulatory Visit | Attending: Cardiology | Admitting: Cardiology

## 2015-08-06 DIAGNOSIS — R0602 Shortness of breath: Secondary | ICD-10-CM | POA: Insufficient documentation

## 2015-08-06 LAB — EXERCISE TOLERANCE TEST
CHL CUP RESTING HR STRESS: 83 {beats}/min
CSEPED: 6 min
CSEPHR: 102 %
Estimated workload: 7.6 METS
Exercise duration (sec): 27 s
MPHR: 142 {beats}/min
Peak HR: 146 {beats}/min
RPE: 17

## 2015-10-23 LAB — BASIC METABOLIC PANEL
BUN: 15 mg/dL (ref 4–21)
CREATININE: 0.7 mg/dL (ref 0.6–1.3)
Glucose: 143 mg/dL
POTASSIUM: 4.1 mmol/L (ref 3.4–5.3)
Sodium: 138 mmol/L (ref 137–147)

## 2015-10-23 LAB — HEPATIC FUNCTION PANEL
ALT: 16 U/L (ref 10–40)
AST: 9 U/L — AB (ref 14–40)
Alkaline Phosphatase: 16 U/L — AB (ref 25–125)
Bilirubin, Total: 0.9 mg/dL

## 2015-10-23 LAB — CBC AND DIFFERENTIAL
HEMATOCRIT: 46 % (ref 41–53)
Hemoglobin: 15 g/dL (ref 13.5–17.5)
Neutrophils Absolute: 3 /uL
Platelets: 310 10*3/uL (ref 150–399)
WBC: 6.3 10^3/mL

## 2015-11-28 DIAGNOSIS — M353 Polymyalgia rheumatica: Secondary | ICD-10-CM | POA: Insufficient documentation

## 2015-11-28 DIAGNOSIS — K269 Duodenal ulcer, unspecified as acute or chronic, without hemorrhage or perforation: Secondary | ICD-10-CM | POA: Insufficient documentation

## 2015-11-28 DIAGNOSIS — M810 Age-related osteoporosis without current pathological fracture: Secondary | ICD-10-CM

## 2015-11-28 DIAGNOSIS — M19242 Secondary osteoarthritis, left hand: Secondary | ICD-10-CM

## 2015-11-28 DIAGNOSIS — C61 Malignant neoplasm of prostate: Secondary | ICD-10-CM

## 2015-11-28 DIAGNOSIS — M0609 Rheumatoid arthritis without rheumatoid factor, multiple sites: Secondary | ICD-10-CM

## 2015-11-28 DIAGNOSIS — M19241 Secondary osteoarthritis, right hand: Secondary | ICD-10-CM | POA: Insufficient documentation

## 2015-11-28 DIAGNOSIS — E7800 Pure hypercholesterolemia, unspecified: Secondary | ICD-10-CM | POA: Insufficient documentation

## 2015-12-25 LAB — CBC AND DIFFERENTIAL
HEMATOCRIT: 47 % (ref 41–53)
HEMOGLOBIN: 15.6 g/dL (ref 13.5–17.5)
PLATELETS: 294 10*3/uL (ref 150–399)
WBC: 4.7 10*3/mL

## 2015-12-25 LAB — BASIC METABOLIC PANEL
BUN: 25 mg/dL — AB (ref 4–21)
CREATININE: 0.8 mg/dL (ref 0.6–1.3)
Glucose: 129 mg/dL
Potassium: 4.7 mmol/L (ref 3.4–5.3)
Sodium: 141 mmol/L (ref 137–147)

## 2015-12-25 LAB — HEPATIC FUNCTION PANEL
ALK PHOS: 66 U/L (ref 25–125)
ALT: 14 U/L (ref 10–40)
AST: 6 U/L — AB (ref 14–40)
Bilirubin, Total: 0.6 mg/dL

## 2016-02-04 ENCOUNTER — Ambulatory Visit: Payer: Medicare Other | Admitting: Rheumatology

## 2016-02-08 ENCOUNTER — Encounter: Payer: Self-pay | Admitting: *Deleted

## 2016-02-08 DIAGNOSIS — E559 Vitamin D deficiency, unspecified: Secondary | ICD-10-CM | POA: Insufficient documentation

## 2016-02-08 DIAGNOSIS — M65332 Trigger finger, left middle finger: Secondary | ICD-10-CM | POA: Insufficient documentation

## 2016-02-08 DIAGNOSIS — M0609 Rheumatoid arthritis without rheumatoid factor, multiple sites: Secondary | ICD-10-CM

## 2016-02-08 DIAGNOSIS — M5134 Other intervertebral disc degeneration, thoracic region: Secondary | ICD-10-CM | POA: Insufficient documentation

## 2016-02-08 DIAGNOSIS — M7551 Bursitis of right shoulder: Secondary | ICD-10-CM | POA: Insufficient documentation

## 2016-02-08 DIAGNOSIS — Z79899 Other long term (current) drug therapy: Secondary | ICD-10-CM | POA: Insufficient documentation

## 2016-02-08 DIAGNOSIS — M5136 Other intervertebral disc degeneration, lumbar region: Secondary | ICD-10-CM | POA: Insufficient documentation

## 2016-02-08 NOTE — Progress Notes (Signed)
*IMAGE* Office Visit Note  Patient: Jeffery [name].             Date of Birth: Feb 01, 1935           MRN: [id number]             PCP: Precious Reel, MD Referring: Shon Baton, MD Visit Date: 02/09/2016 Occupation    Subjective:  No chief complaint on file. Pain shoulder joint   History of Present Illness: Jeffery [name]. is a 81 y.o. male . He states his right shoulder joint pain still is there but much better since he is doing physical therapy. He also has been having some lower back pain. He states he was lifting some windows at home and doing some chores which triggered the lower back discomfort. Although it is tolerable. He has some issues with the trigger finger which is improved since the cortisone injection. He denies any joint swelling. He is going to [place] one in January 2018 and would like to take a prednisone taper with him. He states he uses Voltaren gel on his hands and his knee joints. He uses factor patch on his lower back. He is aware that he should not be using 2 medications at the same time.   Activities of Daily Living:  Patient reports morning stiffness for 10 minutes.   Patient Denies nocturnal pain.  Difficulty dressing/grooming: Denies Difficulty climbing stairs: Denies Difficulty getting out of chair: Denies Difficulty using hands for taps, buttons, cutlery, and/or writing: Reports   Review of Systems  Constitutional: Negative for fatigue and weakness.  HENT: Negative for mouth sores and mouth dryness.   Eyes: Negative for dryness.  Respiratory: Negative for shortness of breath and difficulty breathing.   Cardiovascular: Negative for palpitations and hypertension.  Gastrointestinal: Negative for constipation and diarrhea.  Musculoskeletal: Positive for arthralgias, joint pain and morning stiffness.  Skin: Negative for rash.  Neurological: Negative for headaches.  Hematological: Negative for swollen glands.  Psychiatric/Behavioral: Negative for  depressed mood and sleep disturbance. The patient is not nervous/anxious.     PMFS History:  Patient Active Problem List   Diagnosis Date Noted  . Vitamin D deficiency 02/08/2016  . Trigger finger, left middle finger 02/08/2016  . High risk medication use 02/08/2016  . Bursitis of right shoulder 02/08/2016  . DDD (degenerative disc disease), lumbar 02/08/2016  . DDD (degenerative disc disease), thoracic 02/08/2016  . Rheumatoid arthritis of multiple sites without rheumatoid factor ([place]) 11/28/2015    Class: Chronic  . Other secondary osteoarthritis of both hands 11/28/2015    Class: Chronic  . PMR (polymyalgia rheumatica) (HCC) 11/28/2015    Class: Chronic  . Osteoporosis 11/28/2015    Class: Chronic  . Prostate cancer ([place]) 11/28/2015    Class: History of  . Duodenal ulcer 11/28/2015    Class: Chronic  . High cholesterol 11/28/2015  . Dizziness 07/16/2015  . Incomplete RBBB 03/31/2015  . Sleep apnea, primary central 03/03/2015  . Polyneuropathy ([place]) 01/24/2013  . Right inguinal hernia 07/05/2012    Past Medical History:  Diagnosis Date  . Arthritis   . BCC (basal cell carcinoma)   . BPH (benign prostatic hypertrophy) 2008  . Cancer [facility]) 2008   Prostate  . Complex sleep apnea syndrome   . Diverticulosis   . ED (erectile dysfunction)   . GERD (gastroesophageal reflux disease)   . Hemorrhoids   . Hernia, inguinal   . Hyperlipidemia   . IFG (impaired fasting glucose)   .  Osteopenia   . Peptic ulcer disease with hemorrhage 1993  . RBBB (right bundle branch block)   . Shingles   . Vitamin D deficiency     Family History  Problem Relation Age of Onset  . Heart failure Mother     Died age 35.  Apparently had valve surgery.   . Congenital heart disease Brother    Past Surgical History:  Procedure Laterality Date  . BACK SURGERY N/A 1984  . CATARACT EXTRACTION, BILATERAL    . HERNIA REPAIR    . LAMINECTOMY  1984   L 5  . PROSTATE SURGERY  12/2006   Robotic   . TONSILLECTOMY  81 years old  . TRIGGER FINGER RELEASE  03/01/2012   Procedure: MINOR RELEASE TRIGGER FINGER/A-1 PULLEY;  Surgeon: Cammie Sickle., MD;  Location: [place];  Service: Orthopedics;  Laterality: Left;  long finger   Social History   Social History Narrative   Right handed, Retired .Married, 2 kids. Caffeine none.   3 Step      Objective: Vital Signs: BP 130/61 (BP Location: Left Arm, Patient Position: Sitting, Cuff Size: Large)   Pulse 80   Resp 14   Ht 6\' 2"  (1.88 m)   Wt 187 lb (84.8 kg)   BMI 24.01 kg/m    Physical Exam  Constitutional: He is oriented to person, place, and time. He appears well-developed and well-nourished.  HENT:  Head: Normocephalic and atraumatic.  Eyes: EOM are normal. Pupils are equal, round, and reactive to light.  Neck: Normal range of motion. Neck supple.  Cardiovascular: Normal rate, regular rhythm and normal heart sounds.   Pulmonary/Chest: Effort normal and breath sounds normal.  Abdominal: Soft. Bowel sounds are normal.  Neurological: He is alert and oriented to person, place, and time.  Skin: Skin is warm and dry.  Psychiatric: He has a normal mood and affect. His behavior is normal.  Nursing note and vitals reviewed.    Musculoskeletal Exam: He is good range of motion of his C-spine, thoracic, lumbar spine, shoulder joints although joints are good range of motion he has thickening of bilateral PIP/DIP CMC joints but no synovitis was noted. No synovitis was noted over her MCP joints he has incomplete extension of his right third and left third and fourth finger. Hip joints, knee joints, ankle joints are good range of motion no synovitis or MTP joints was noted.  CDAI Exam: CDAI Homunculus Exam:   Joint Counts:  CDAI Tender Joint count: 0 CDAI Swollen Joint count: 0  Global Assessments:  Patient Global Assessment: 3 Provider Global Assessment: 3  CDAI Calculated Score:  6    Investigation: Findings:  08/2015: SPEP normal, Ig normal, PPD negative, Hepatitis negative 08/2015: [place] hands showed positive synovitis 03/2015: DXA showed T score of -2 in the R femur neck    Imaging: No results found.  Speciality Comments: No specialty comments available.    Procedures:  No procedures performed Allergies: Sulfasalazine and Demerol [meperidine]   Assessment / Plan: Visit Diagnoses:  Rheumatoid arthritis of multiple sites without rheumatoid factor (HCC) - RF -, CCP -. 14-3-3 eta negative, ANA negative He is on methotrexate 6 tablets per week which she is tolerating well is no synovitis on examination today. He requested a prednisone taper while he will be in [place] to have him hold in case he has a flare. I will give him a prescription for hold.  Other secondary osteoarthritis of both hands: He has some  stiffness from osteoarthritis which is tolerable using Voltaren gel.  PMR (polymyalgia rheumatica) ([place]): In remission.  Osteoporosis, unspecified osteoporosis type, unspecified pathological fracture presence: He is on Fosamax and his next bone density will be to and December 2018.  Vitamin D deficiency: He's been taking supplement.  Trigger finger, left middle finger: It is improved after the cortisone injection.  High risk medication use: The been monitoring his labs every 3 months which has been stable.  Bursitis of right shoulder: It is improved after the physical therapy.  DDD (degenerative disc disease), lumbar: He has mild flare of lower back pain after doing some strenuous activities. Joint protection and muscle strengthening was discussed.  DDD (degenerative disc disease), thoracic    Orders: No orders of the defined types were placed in this encounter.  Meds ordered this encounter  Medications  . D3-50 50000 units capsule    Sig: take 1 capsule by mouth ONCE A MONTH    Refill:  0  . diclofenac sodium (VOLTAREN) 1 % GEL    Sig:  Apply 4 g topically as needed.    Dispense:  5 Tube    Refill:  0  . diclofenac (FLECTOR) 1.3 % PTCH    Sig: Place 1 patch onto the skin 2 (two) times daily.    Dispense:  180 patch    Refill:  0  . predniSONE (DELTASONE) 5 MG tablet    Sig: Take 4 tab for 4 days, then 3 tab for 4 days, then 2 tab for 4 days, then 1 tab for 4 days then 1/2 tab for 4 days if needed for flare    Dispense:  42 tablet    Refill:  0    Face-to-face time spent with patient was 38minutes. 50% of time was spent in counseling and coordination of care.  Follow-Up Instructions: Return in about 5 months (around 07/09/2016) for Rheumatoid arthritis.   Bo Merino, MD

## 2016-02-09 ENCOUNTER — Encounter: Payer: Self-pay | Admitting: Rheumatology

## 2016-02-09 ENCOUNTER — Ambulatory Visit (INDEPENDENT_AMBULATORY_CARE_PROVIDER_SITE_OTHER): Payer: Medicare Other | Admitting: Rheumatology

## 2016-02-09 VITALS — BP 130/61 | HR 80 | Resp 14 | Ht 74.0 in | Wt 187.0 lb

## 2016-02-09 DIAGNOSIS — M353 Polymyalgia rheumatica: Secondary | ICD-10-CM | POA: Diagnosis not present

## 2016-02-09 DIAGNOSIS — M0609 Rheumatoid arthritis without rheumatoid factor, multiple sites: Secondary | ICD-10-CM | POA: Diagnosis not present

## 2016-02-09 DIAGNOSIS — M19241 Secondary osteoarthritis, right hand: Secondary | ICD-10-CM

## 2016-02-09 DIAGNOSIS — M5134 Other intervertebral disc degeneration, thoracic region: Secondary | ICD-10-CM

## 2016-02-09 DIAGNOSIS — M810 Age-related osteoporosis without current pathological fracture: Secondary | ICD-10-CM | POA: Diagnosis not present

## 2016-02-09 DIAGNOSIS — M19242 Secondary osteoarthritis, left hand: Secondary | ICD-10-CM

## 2016-02-09 DIAGNOSIS — M65332 Trigger finger, left middle finger: Secondary | ICD-10-CM

## 2016-02-09 DIAGNOSIS — M7551 Bursitis of right shoulder: Secondary | ICD-10-CM

## 2016-02-09 DIAGNOSIS — Z79899 Other long term (current) drug therapy: Secondary | ICD-10-CM | POA: Diagnosis not present

## 2016-02-09 DIAGNOSIS — I6529 Occlusion and stenosis of unspecified carotid artery: Secondary | ICD-10-CM

## 2016-02-09 DIAGNOSIS — E559 Vitamin D deficiency, unspecified: Secondary | ICD-10-CM | POA: Diagnosis not present

## 2016-02-09 DIAGNOSIS — M5136 Other intervertebral disc degeneration, lumbar region: Secondary | ICD-10-CM | POA: Diagnosis not present

## 2016-02-09 MED ORDER — DICLOFENAC EPOLAMINE 1.3 % TD PTCH: 1.0000 | MEDICATED_PATCH | Freq: Two times a day (BID) | TRANSDERMAL | 0 refills | Status: AC

## 2016-02-09 MED ORDER — DICLOFENAC SODIUM 1 % TD GEL: 4.0000 g | TRANSDERMAL | 0 refills | Status: DC | PRN

## 2016-02-09 MED ORDER — PREDNISONE 5 MG PO TABS: ORAL_TABLET | ORAL | 0 refills | Status: DC

## 2016-03-01 ENCOUNTER — Encounter: Payer: Self-pay | Admitting: Neurology

## 2016-03-01 ENCOUNTER — Ambulatory Visit (INDEPENDENT_AMBULATORY_CARE_PROVIDER_SITE_OTHER): Payer: Medicare Other | Admitting: Neurology

## 2016-03-01 VITALS — BP 149/83 | HR 101 | Resp 20 | Ht 74.0 in | Wt 186.0 lb

## 2016-03-01 DIAGNOSIS — I6529 Occlusion and stenosis of unspecified carotid artery: Secondary | ICD-10-CM | POA: Diagnosis not present

## 2016-03-01 DIAGNOSIS — G609 Hereditary and idiopathic neuropathy, unspecified: Secondary | ICD-10-CM

## 2016-03-01 NOTE — Progress Notes (Signed)
[name]  Provider:  Larey Seat, M D  Referring Provider: Shon Baton, MD Primary Care Physician:  Precious Reel, MD  Chief Complaint  Patient presents with  . Follow-up    going well    HPI:  Jeffery [name]. is a 81 y.o. male seen here as a revisit  from Jeffery [name] for complex apnea  And questions about neuropathy.  This patient of Jeffery [name] Dr. Estanislado [name] has followed up since May 2011. Initially he was referred for a polysomnogram based on chronic fatigue complaints and was diagnosed as complex apnea.  The apnea short-billed complements central and obstructive, but in a distribution that was more typical for a with apnea.  They were neuropathy related labs sent out and 2011 as well as also to address chronic fatigue and all returned to normal range. The patient clearly has an accentuated AHI by position and during REM sleep. He has been healthy and maintains an active lifestyle is traveling is a nonsmoker nondrinker and has nausea history. The patient responded well to Wellbutrin on only 100 mg daily taken to eliminate or reduce central apneas. During his last visit in 2013 his fatigue score was 20 and his Epworth score 14 points and he had reported one nocturia on average per night. He estimated his daily sleep time is 7 hours or more. He sometimes naps in the afternoons. This is still the case today:  his Epworth is 11 points, and fatigue score is 24. I would like to add that the patient is on chronic Plaquenil therapy. This was initiated after he developed severe muscle stiffness and relief this is the symptoms within 2-3 weeks. The rigidity was perceived as painful and restricted. 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 Plaquenil  may contribute somewhat to the development of the neuropathy. Wellbutrin has had some positive effect on the level of alertness and daytime and may also be due  discomfort.  03-03-15 [name] reports that he had taken a drug holiday and for about 6 months did not use Plaquenil. As the responds he noted again stiffness in his hands and inflammation. Dr. Estanislado [name] , his rheumatologist, had placed him on his prednisone taper but then Plaquenil was reinitiated about 3 months ago. I would like to add that the stiffness has never affected his lower extremities or feet and has always been manifested in his hands. He also has 3 trigger fingers. Fatigue is stable. His Epworth score is endorsed at 9 points and his fatigue severity at 34 points. The geriatric depression score was  endorsed at 2 points.  We are seeing today after [name] suffered a sudden fall with a laceration of the left for head and fracture of the left thumb. He was walking briskly but on an uneven surface and initially believed that he tripped and fell. But he had as sudden premonition to slow down,  before he fell which tells me that he most likely fainted. He has no history of seizures, he was able to break his fall but had no focal deficits [name] is a fall and also did not feel that he had problems was generating words, but his thought process was scrambled or that he had any focal motor deficits. This rules out a TIA.  Once he was on the ground he was immediately conscious and aware of his surroundings- he was not combative and he will actually waited patiently for EMS to arrive. The ED reports  were reviewed here with him. A cardiac workup was initiated and his transthoracic echo cardiogram revealed no abnormalities.  He also underwent carotid Doppler studies which showed minor in my opinion trivial calcifications at the bifurcation. These would be in the range between 1 and 40% the nonsurgical indication range. I would like for the patient to take a baby aspirin is possibly enteric coated once a day should he develop bruising I will like him to take it every other day. I also discussed with him  that we have never established at baseline for his neuropathy. I would like for him as early as available in the new year to undergo an EMG and nerve conduction study to evaluate the degree of peripheral neuropathy and axonal versus demyelinating quality of the neuropathy. The patient is interested. A recent comprehensive metabolic panel was Dr. Franchot Gallo was interpreted as normal , as well as a CBC diff  which she has every 3 months.  03-01-2016, [name] 'tuel report some tingling in his right foot plantar and dorsal at the midfoot level. This is not painful but is most likely a manifestation of his neuropathy. He has remains with good range of motion, has not gained weight. He has been stable and feeling well. Only refills today.     Review of Systems: Out of a complete 14 system review, the patient complains of only the following symptoms, and all other reviewed systems are negative. Intermittent fatigue. Fatigue is stable. His Epworth score is endorsed at 9 points and his fatigue severity at 34 points. The geriatric depression score was  endorsed at 2 points. night blindness.    Social History   Social History  . Marital status: Married    Spouse name: Jeffery [name]  . Number of children: 2  . Years of education: Jeffery [name]   Occupational History  . Not on file.   Social History Main Topics  . Smoking status: Never Smoker  . Smokeless tobacco: Never Used  . Alcohol use No  . Drug use: No  . Sexual activity: Not on file   Other Topics Concern  . Not on file   Social History Narrative   Right handed, Retired .Married, 2 kids. Caffeine none.   3 Step     Family History  Problem Relation Age of Onset  . Heart failure Mother     Died age 56.  Apparently had valve surgery.   . Congenital heart disease Brother     Past Medical History:  Diagnosis Date  . Arthritis   . BCC (basal cell carcinoma)   . BPH (benign prostatic hypertrophy) 2008  . Cancer [facility]) 2008   Prostate  . Complex sleep  apnea syndrome   . Diverticulosis   . ED (erectile dysfunction)   . GERD (gastroesophageal reflux disease)   . Hemorrhoids   . Hernia, inguinal   . Hyperlipidemia   . IFG (impaired fasting glucose)   . Osteopenia   . Peptic ulcer disease with hemorrhage 1993  . RBBB (right bundle branch block)   . Shingles   . Vitamin D deficiency     Past Surgical History:  Procedure Laterality Date  . BACK SURGERY Jeffery [name] 1984  . CATARACT EXTRACTION, BILATERAL    . HERNIA REPAIR    . LAMINECTOMY  1984   L 5  . PROSTATE SURGERY  12/2006   Robotic  . TONSILLECTOMY  81 years old  . TRIGGER FINGER RELEASE  03/01/2012   Procedure: MINOR RELEASE TRIGGER FINGER/A-1 PULLEY;  Surgeon: Cammie Sickle., MD;  Location: [facility];  Service: Orthopedics;  Laterality: Left;  long finger    Current Outpatient Prescriptions  Medication Sig Dispense Refill  . acetaminophen (TYLENOL) 500 MG tablet Take 500 mg by mouth every 6 (six) hours as needed.    Marland Kitchen alendronate (FOSAMAX) 70 MG tablet Take 70 mg by mouth once a week. Take with a full glass of water on an empty stomach.    . Calcium Carbonate (CALCIUM 600 PO) Take 600 mg by mouth daily.    . Cholecalciferol (VITAMIN D3) 2000 units capsule Take 2,000 Units by mouth daily.    . Coenzyme Q10 200 MG capsule Take 200 mg by mouth 2 (two) times daily.     . colesevelam (WELCHOL) 625 MG tablet Take 1,250 mg by mouth 3 (three) times daily.     . D3-50 50000 units capsule take 1 capsule by mouth ONCE A MONTH  0  . diclofenac (FLECTOR) 1.3 % PTCH Place 1 patch onto the skin 2 (two) times daily. 180 patch 0  . diclofenac sodium (VOLTAREN) 1 % GEL Apply 4 g topically as needed. 5 Tube 0  . folic acid (FOLVITE) 1 MG tablet Take 1 mg by mouth daily.    . hydroxychloroquine (PLAQUENIL) 200 MG tablet Take 200 mg by mouth 2 (two) times daily.    . methotrexate (RHEUMATREX) 2.5 MG tablet Take 15 mg by mouth once a week. Caution:Chemotherapy. Protect from  light.     Marland Kitchen omega-3 acid ethyl esters (LOVAZA) 1 G capsule Take 1 g by mouth daily.     . predniSONE (DELTASONE) 5 MG tablet Take 4 tab for 4 days, then 3 tab for 4 days, then 2 tab for 4 days, then 1 tab for 4 days then 1/2 tab for 4 days if needed for flare 42 tablet 0  . UNABLE TO FIND Med Name: Tumeric    . vitamin E 100 UNIT capsule Take 100 Units by mouth daily.     No current facility-administered medications for this visit.     Allergies as of 03/01/2016 - Review Complete 03/01/2016  Allergen Reaction Noted  . Sulfasalazine Anaphylaxis 02/08/2016  . Demerol [meperidine] Nausea Only 03/01/2012    Vitals: BP (!) 149/83   Pulse (!) 101   Resp 20   Ht 6\' 2"  (1.88 m)   Wt 186 lb (84.4 kg)   BMI 23.88 kg/m  Last Weight:  Wt Readings from Last 1 Encounters:  03/01/16 186 lb (84.4 kg)   Last Height:   Ht Readings from Last 1 Encounters:  03/01/16 6\' 2"  (1.88 m)    Physical exam:  General: The patient is awake, alert and appears not in acute distress. The patient is well groomed. Head: Normocephalic, atraumatic. Neck is supple. Mallampati 2 , neck circumference: 14.5 inches.   Cardiovascular:  Regular rate and rhythm, without  murmurs or carotid bruit, and without distended neck veins. Respiratory: Lungs are clear to auscultation. Skin:  Without evidence of edema, or rash Trunk: BMI is normal.  Neurologic exam : The patient is awake and alert, oriented to place and time.   Memory subjective described as intact. There is a normal attention span & concentration ability.  Speech is fluent without dysarthria, dysphonia or aphasia.  Mood and affect are appropriate.  Cranial nerves: Pupils are equal and briskly reactive to light. Funduscopic exam without  evidence of pallor or edema.  Extraocular movements  in vertical and horizontal  planes intact and without nystagmus.  Visual fields by finger perimetry are intact.Hearing to finger rub intact. Facial sensation intact to  fine touch.  Facial motor strength is symmetric and tongue and uvula move midline.  Motor exam:  Normal tone and normal muscle bulk and symmetric normal strength in all extremities. Sensory:  Fine touch, pinprick and vibration were normal. i did not test the planta pedis.  Coordination: Rapid alternating movements in the fingers/hands is tested and normal.  Finger-to-nose maneuver tested and normal without evidence of ataxia, dysmetria or tremor.  Gait and station: Patient walks without assistive device. Strength within normal limits. Stance is stable and normal. Deep tendon reflexes: in the  upper and lower extremities are attenuated , symmetric and intact.   Assessment:  After physical and neurologic examination, review of laboratory studies, imaging, neurophysiology testing and pre-existing records, assessment is   1) complex apnea, mild - not enough to be on CPAP , helped  on Wellbutrin 150 mg XR one in AM.  Doing subjectively well.   2) Plaquenil for stiffness, followed by rheumatology, "ill defined polymyalgia, connective tissue disease" quote from patient- he  takes   Coenzyme Q 10 .  3) undetectable PSA.   4) basal cell carcinoma, removed from the right preauricular area, 9th of November    Plan:  Treatment plan and additional workup :   No changes in CPAP therapy.   Blood work every 6 month with Dr. Estanislado [name].    Shifra Swartzentruber, MD   CC Dyke Brackett, MD and Shon Baton, MD

## 2016-03-21 ENCOUNTER — Other Ambulatory Visit: Payer: Self-pay | Admitting: Radiology

## 2016-03-21 DIAGNOSIS — Z79899 Other long term (current) drug therapy: Secondary | ICD-10-CM

## 2016-03-21 DIAGNOSIS — E559 Vitamin D deficiency, unspecified: Secondary | ICD-10-CM

## 2016-03-22 ENCOUNTER — Telehealth: Payer: Self-pay | Admitting: *Deleted

## 2016-03-22 LAB — COMPLETE METABOLIC PANEL WITH GFR
ALBUMIN: 4.4 g/dL (ref 3.6–5.1)
ALK PHOS: 64 U/L (ref 40–115)
ALT: 14 U/L (ref 9–46)
AST: 9 U/L — ABNORMAL LOW (ref 10–35)
BUN: 19 mg/dL (ref 7–25)
CALCIUM: 9.8 mg/dL (ref 8.6–10.3)
CO2: 30 mmol/L (ref 20–31)
Chloride: 102 mmol/L (ref 98–110)
Creat: 0.69 mg/dL — ABNORMAL LOW (ref 0.70–1.18)
GFR, Est African American: >89 mL/min (ref >=60)
Glucose, Bld: 133 mg/dL — ABNORMAL HIGH (ref 65–99)
POTASSIUM: 4.8 mmol/L (ref 3.5–5.3)
Sodium: 140 mmol/L (ref 135–146)
Total Bilirubin: 0.9 mg/dL (ref 0.2–1.2)
Total Protein: 7.4 g/dL (ref 6.1–8.1)

## 2016-03-22 LAB — CBC WITH DIFFERENTIAL/PLATELET
BASOS ABS: 0 {cells}/uL (ref 0–200)
Basophils Relative: 0 %
Eosinophils Absolute: 116 cells/uL (ref 15–500)
Eosinophils Relative: 2 %
HEMATOCRIT: 48.1 % (ref 38.5–50.0)
HEMOGLOBIN: 15.7 g/dL (ref 13.2–17.1)
LYMPHS ABS: 1856 {cells}/uL (ref 850–3900)
Lymphocytes Relative: 32 %
MCH: 31.3 pg (ref 27.0–33.0)
MCHC: 32.6 g/dL (ref 32.0–36.0)
MCV: 95.8 fL (ref 80.0–100.0)
MONO ABS: 522 {cells}/uL (ref 200–950)
MPV: 9.2 fL (ref 7.5–12.5)
Monocytes Relative: 9 %
NEUTROS PCT: 57 %
Neutro Abs: 3306 cells/uL (ref 1500–7800)
Platelets: 276 10*3/uL (ref 140–400)
RBC: 5.02 MIL/uL (ref 4.20–5.80)
RDW: 13.3 % (ref 11.0–15.0)
WBC: 5.8 10*3/uL (ref 3.8–10.8)

## 2016-03-22 LAB — VITAMIN D 25 HYDROXY (VIT D DEFICIENCY, FRACTURES): VIT D 25 HYDROXY: 39 ng/mL (ref 30–100)

## 2016-03-22 MED ORDER — VITAMIN D (ERGOCALCIFEROL) 1.25 MG (50000 UNIT) PO CAPS: 50000.0000 [IU] | ORAL_CAPSULE | ORAL | 4 refills | Status: DC

## 2016-03-22 NOTE — Telephone Encounter (Signed)
-----   Message from Bo Merino, MD sent at 03/22/2016 12:28 PM EST ----- Vitamin D normal now. Okay to continue with vitamin D 50,000 units 1 tablet by mouth every month. May refill as needed

## 2016-03-22 NOTE — Progress Notes (Signed)
Vitamin D normal now. Okay to continue with vitamin D 50,000 units 1 tablet by mouth every month. May refill as needed

## 2016-03-27 ENCOUNTER — Other Ambulatory Visit: Payer: Self-pay | Admitting: Rheumatology

## 2016-03-28 NOTE — Telephone Encounter (Signed)
ok 

## 2016-03-28 NOTE — Telephone Encounter (Signed)
Last Visit: 02/09/16 Next visit: 07/07/15 Labs: 03/21/16 Low Vit D  Okay to refill Fosamax?

## 2016-04-08 ENCOUNTER — Other Ambulatory Visit: Payer: Self-pay | Admitting: Rheumatology

## 2016-04-08 NOTE — Telephone Encounter (Signed)
Patient already treated with vitamin D prescription strength on 03/22/2016.  Pt will need vit d 25-oh in 3 months

## 2016-04-08 NOTE — Telephone Encounter (Signed)
Last Visit: 02/09/16 Next Visit: 07/06/16 Labs: 03/21/16 Low Vit D  Okay to refill MTX?

## 2016-04-26 ENCOUNTER — Other Ambulatory Visit: Payer: Self-pay | Admitting: Rheumatology

## 2016-04-26 NOTE — Telephone Encounter (Signed)
Last Visit: 02/09/16 Next Visit: 07/06/16 Labs: 03/21/16 WNL  Okay to refill Lovaza?

## 2016-04-28 NOTE — Telephone Encounter (Signed)
ok 

## 2016-06-12 ENCOUNTER — Other Ambulatory Visit: Payer: Self-pay | Admitting: Rheumatology

## 2016-06-13 NOTE — Telephone Encounter (Signed)
Last Visit: 02/09/16 Next Visit: 07/06/16 Labs: 03/21/16 WNL  Okay to refill Fosamax?

## 2016-06-13 NOTE — Telephone Encounter (Signed)
ok 

## 2016-06-27 NOTE — Progress Notes (Signed)
Office Visit Note  Patient: Jeffery [name].             Date of Birth: 02/22/1935           MRN: [id number]             PCP: Precious Reel, MD Referring: Shon Baton, MD Visit Date: 07/04/2016 Occupation: _0 @    Subjective:  Pain in right shoulder   History of Present Illness: Jeffery [name] Leon. is a 82 y.o. male with history of rheumatoid arthritis and polymyalgia rheumatica. He is been on methotrexate for almost 6 months now. He has not had any flares without any joint swelling. He continues to have pain and discomfort in his bilateral shoulders mostly in his right shoulder. He went through physical therapy without much relief to his right shoulder. He states he has no discomfort with range of motion of his right shoulder although he has nocturnal pain when he sleeps on his right side. He continues to have right third fourth and left fourth trigger finger. He did not respond well to injections in the past. He also had surgery on his left third trigger finger without good results. He also feels that his core strength is not good.  Activities of Daily Living:  Patient reports morning stiffness for 1 hour.   Patient Reports nocturnal pain.  Difficulty dressing/grooming: Denies Difficulty climbing stairs: Denies Difficulty getting out of chair: Denies Difficulty using hands for taps, buttons, cutlery, and/or writing: Denies   Review of Systems  Constitutional: Positive for fatigue. Negative for night sweats and weakness ( ).  HENT: Negative for mouth sores, mouth dryness and nose dryness.   Eyes: Negative for redness and dryness.  Respiratory: Negative for shortness of breath and difficulty breathing.   Cardiovascular: Negative for chest pain, palpitations, hypertension, irregular heartbeat and swelling in legs/feet.  Gastrointestinal: Negative for constipation and diarrhea.  Endocrine: Negative for increased urination.  Musculoskeletal: Positive for arthralgias and  joint pain. Negative for joint swelling, myalgias, muscle weakness, morning stiffness, muscle tenderness and myalgias.  Skin: Negative for color change, rash, hair loss, nodules/bumps, skin tightness, ulcers and sensitivity to sunlight.  Allergic/Immunologic: Negative for susceptible to infections.  Neurological: Negative for dizziness, fainting, memory loss and night sweats.  Hematological: Negative for swollen glands.  Psychiatric/Behavioral: Positive for sleep disturbance. Negative for depressed mood. The patient is not nervous/anxious.     PMFS History:  Patient Active Problem List   Diagnosis Date Noted  . Vitamin D deficiency 02/08/2016  . Trigger finger, left middle finger 02/08/2016  . High risk medication use 02/08/2016  . Bursitis of right shoulder 02/08/2016  . DDD (degenerative disc disease), lumbar 02/08/2016  . DDD (degenerative disc disease), thoracic 02/08/2016  . Rheumatoid arthritis of multiple sites without rheumatoid factor ([place]) 11/28/2015    Class: Chronic  . Other secondary osteoarthritis of both hands 11/28/2015    Class: Chronic  . PMR (polymyalgia rheumatica) (HCC) 11/28/2015    Class: Chronic  . Osteoporosis 11/28/2015    Class: Chronic  . Prostate cancer ([place]) 11/28/2015    Class: History of  . Duodenal ulcer 11/28/2015    Class: Chronic  . High cholesterol 11/28/2015  . Dizziness 07/16/2015  . Incomplete RBBB 03/31/2015  . Sleep apnea, primary central 03/03/2015  . Polyneuropathy ([place]) 01/24/2013  . Right inguinal hernia 07/05/2012    Past Medical History:  Diagnosis Date  . Arthritis   . BCC (basal cell carcinoma)   . BPH (benign  prostatic hypertrophy) 2008  . Cancer [facility]) 2008   Prostate  . Complex sleep apnea syndrome   . Diverticulosis   . ED (erectile dysfunction)   . GERD (gastroesophageal reflux disease)   . Hemorrhoids   . Hernia, inguinal   . Hyperlipidemia   . IFG (impaired fasting glucose)   . Osteopenia   . Peptic ulcer  disease with hemorrhage 1993  . RBBB (right bundle branch block)   . Shingles   . Vitamin D deficiency     Family History  Problem Relation Age of Onset  . Heart failure Mother     Died age 40.  Apparently had valve surgery.   . Congenital heart disease Brother    Past Surgical History:  Procedure Laterality Date  . BACK SURGERY N/A 1984  . CATARACT EXTRACTION, BILATERAL    . HERNIA REPAIR    . LAMINECTOMY  1984   L 5  . PROSTATE SURGERY  12/2006   Robotic  . TONSILLECTOMY  82 years old  . TRIGGER FINGER RELEASE  03/01/2012   Procedure: MINOR RELEASE TRIGGER FINGER/A-1 PULLEY;  Surgeon: Cammie Sickle., MD;  Location: [place];  Service: Orthopedics;  Laterality: Left;  long finger   Social History   Social History Narrative   Right handed, Retired .Married, 2 kids. Caffeine none.   3 Step      Objective: Vital Signs: BP 126/73   Pulse 71   Resp 13   Ht _0  (1.905 m)   Wt 183 lb (83 kg)   BMI 22.87 kg/m    Physical Exam  Constitutional: He is oriented to person, place, and time. He appears well-developed and well-nourished.  HENT:  Head: Normocephalic and atraumatic.  Eyes: Conjunctivae and EOM are normal. Pupils are equal, round, and reactive to light.  Neck: Normal range of motion. Neck supple.  Cardiovascular: Normal rate, regular rhythm and normal heart sounds.   Pulmonary/Chest: Effort normal and breath sounds normal.  Abdominal: Soft. Bowel sounds are normal.  Neurological: He is alert and oriented to person, place, and time.  Skin: Skin is warm and dry. Capillary refill takes less than 2 seconds.  Psychiatric: He has a normal mood and affect. His behavior is normal.  Nursing note and vitals reviewed.    Musculoskeletal Exam: C-spine good range of motion. Bilateral shoulder joints elbow joints wrist joints with good range of motion. He has a right third fourth trigger finger and left third for trigger finger with Dupuytren's  contracture. No synovitis was noted on examination today. He had discomfort with range of motion of his right shoulder with some subacromial bursitis. Hip joints knee joints ankles MTPs PIPs with good range of motion with no synovitis.  CDAI Exam: CDAI Homunculus Exam:   Joint Counts:  CDAI Tender Joint count: 0 CDAI Swollen Joint count: 0  Global Assessments:  Patient Global Assessment: 3 Provider Global Assessment: 3  CDAI Calculated Score: 6    Investigation: No additional findings.  Orders Only on 06/29/2016  Component Date Value Ref Range Status  . Vit D, 25-Hydroxy 06/29/2016 36  30 - 100 ng/mL Final   Comment: Vitamin D Status           25-OH Vitamin D        Deficiency                <20 ng/mL        Insufficiency  20 - 29 ng/mL        Optimal             > or = 30 ng/mL   For 25-OH Vitamin D testing on patients on D2-supplementation and patients for whom quantitation of D2 and D3 fractions is required, the QuestAssureD 25-OH VIT D, (D2,D3), LC/MS/MS is recommended: order code [id number] (patients > 2 yrs).   Orders Only on 06/29/2016  Component Date Value Ref Range Status  . WBC 06/29/2016 4.6  3.8 - 10.8 K/uL Final  . RBC 06/29/2016 4.93  4.20 - 5.80 MIL/uL Final  . Hemoglobin 06/29/2016 15.4  13.2 - 17.1 g/dL Final  . HCT 06/29/2016 46.0  38.5 - 50.0 % Final  . MCV 06/29/2016 93.3  80.0 - 100.0 fL Final  . MCH 06/29/2016 31.2  27.0 - 33.0 pg Final  . MCHC 06/29/2016 33.5  32.0 - 36.0 g/dL Final  . RDW 06/29/2016 13.5  11.0 - 15.0 % Final  . Platelets 06/29/2016 259  140 - 400 K/uL Final  . MPV 06/29/2016 9.4  7.5 - 12.5 fL Final  . Neutro Abs 06/29/2016 2622  1,500 - 7,800 cells/uL Final  . Lymphs Abs 06/29/2016 1472  850 - 3,900 cells/uL Final  . Monocytes Absolute 06/29/2016 414  200 - 950 cells/uL Final  . Eosinophils Absolute 06/29/2016 92  15 - 500 cells/uL Final  . Basophils Absolute 06/29/2016 0  0 - 200 cells/uL Final  . Neutrophils Relative %  06/29/2016 57  % Final  . Lymphocytes Relative 06/29/2016 32  % Final  . Monocytes Relative 06/29/2016 9  % Final  . Eosinophils Relative 06/29/2016 2  % Final  . Basophils Relative 06/29/2016 0  % Final  . Smear Review 06/29/2016 Criteria for review not met   Final  . Sodium 06/29/2016 139  135 - 146 mmol/L Final  . Potassium 06/29/2016 4.4  3.5 - 5.3 mmol/L Final  . Chloride 06/29/2016 102  98 - 110 mmol/L Final  . CO2 06/29/2016 29  20 - 31 mmol/L Final  . Glucose, Bld 06/29/2016 155* 65 - 99 mg/dL Final  . BUN 06/29/2016 16  7 - 25 mg/dL Final  . Creat 06/29/2016 0.66* 0.70 - 1.18 mg/dL Final   Comment:   For patients > or = 80 years of age: The upper reference limit for Creatinine is approximately 13% higher for people identified as African-American.     . Total Bilirubin 06/29/2016 0.6  0.2 - 1.2 mg/dL Final  . Alkaline Phosphatase 06/29/2016 56  40 - 115 U/L Final  . AST 06/29/2016 9* 10 - 35 U/L Final  . ALT 06/29/2016 19  9 - 46 U/L Final  . Total Protein 06/29/2016 6.9  6.1 - 8.1 g/dL Final  . Albumin 06/29/2016 4.3  3.6 - 5.1 g/dL Final  . Calcium 06/29/2016 9.4  8.6 - 10.3 mg/dL Final  . GFR, Est African American 06/29/2016 >89  >=60 mL/min Final  . GFR, Est Non African American 06/29/2016 >89  >=60 mL/min Final    Imaging: No results found.  Speciality Comments: No specialty comments available.    Procedures:  No procedures performed Allergies: Sulfasalazine and Demerol [meperidine]   Assessment / Plan:     Visit Diagnoses: Rheumatoid arthritis of multiple sites without rheumatoid factor (HCC) - RF negative, CCP negative, 1433 eta negative, ANA negative. He has no active synovitis currently. History in quite well on methotrexate. He is tolerating the medication well.  PMR (  polymyalgia rheumatica) ([place]): No muscular weakness or tenderness was noted.  High risk medication use - Methotrexate 6 tablets by mouth every week, folic acid 1 mg by mouth daily. His  labs have been within normal limits we'll continue to check labs every 3 months.  Primary osteoarthritis of both hands: The pain is tolerable.  Trigger finger, left middle finger: He was not interested in getting cortisone injection.  Bursitis of right shoulder: His right shoulder joint continues to hurt despite the physical therapy. His been having chronic pain for a while now and wants to be further evaluated. I'll schedule MRI of his right shoulder joint to evaluate this further.  DDD (degenerative disc disease), thoracic  DDD (degenerative disc disease), lumbar pain is tolerable  Cor muscle strengthening exercises were demonstrated and discussed at length.  Polyneuropathy (HCC)  Osteoporosis: He is currently on calcium and vitamin D.  Vitamin D deficiency  Prostate cancer ([place])    Orders: No orders of the defined types were placed in this encounter.  No orders of the defined types were placed in this encounter.   Face-to-face time spent with patient was 30 minutes. 50% of time was spent in counseling and coordination of care.  Follow-Up Instructions: Return in about 5 months (around 12/04/2016) for Rheumatoid arthritis, Polymyalgia rheumatica.   Bo Merino, MD  Note - This record has been created using Editor, commissioning.  Chart creation errors have been sought, but may not always  have been located. Such creation errors do not reflect on  the standard of medical care.

## 2016-06-29 ENCOUNTER — Other Ambulatory Visit: Payer: Self-pay | Admitting: Pharmacist

## 2016-06-29 ENCOUNTER — Other Ambulatory Visit: Payer: Self-pay | Admitting: *Deleted

## 2016-06-29 ENCOUNTER — Other Ambulatory Visit: Payer: Self-pay | Admitting: Rheumatology

## 2016-06-29 DIAGNOSIS — E559 Vitamin D deficiency, unspecified: Secondary | ICD-10-CM

## 2016-06-29 DIAGNOSIS — Z79899 Other long term (current) drug therapy: Secondary | ICD-10-CM

## 2016-06-29 LAB — COMPLETE METABOLIC PANEL WITH GFR
ALBUMIN: 4.3 g/dL (ref 3.6–5.1)
ALT: 19 U/L (ref 9–46)
AST: 9 U/L — AB (ref 10–35)
Alkaline Phosphatase: 56 U/L (ref 40–115)
BILIRUBIN TOTAL: 0.6 mg/dL (ref 0.2–1.2)
BUN: 16 mg/dL (ref 7–25)
CALCIUM: 9.4 mg/dL (ref 8.6–10.3)
CHLORIDE: 102 mmol/L (ref 98–110)
CO2: 29 mmol/L (ref 20–31)
CREATININE: 0.66 mg/dL — AB (ref 0.70–1.18)
GFR, Est Non African American: >89 mL/min (ref >=60)
Glucose, Bld: 155 mg/dL — ABNORMAL HIGH (ref 65–99)
Potassium: 4.4 mmol/L (ref 3.5–5.3)
Sodium: 139 mmol/L (ref 135–146)
Total Protein: 6.9 g/dL (ref 6.1–8.1)

## 2016-06-29 LAB — CBC WITH DIFFERENTIAL/PLATELET
BASOS ABS: 0 {cells}/uL (ref 0–200)
Basophils Relative: 0 %
EOS ABS: 92 {cells}/uL (ref 15–500)
Eosinophils Relative: 2 %
HCT: 46 % (ref 38.5–50.0)
Hemoglobin: 15.4 g/dL (ref 13.2–17.1)
LYMPHS PCT: 32 %
Lymphs Abs: 1472 cells/uL (ref 850–3900)
MCH: 31.2 pg (ref 27.0–33.0)
MCHC: 33.5 g/dL (ref 32.0–36.0)
MCV: 93.3 fL (ref 80.0–100.0)
MONOS PCT: 9 %
MPV: 9.4 fL (ref 7.5–12.5)
Monocytes Absolute: 414 cells/uL (ref 200–950)
Neutro Abs: 2622 cells/uL (ref 1500–7800)
Neutrophils Relative %: 57 %
Platelets: 259 10*3/uL (ref 140–400)
RBC: 4.93 MIL/uL (ref 4.20–5.80)
RDW: 13.5 % (ref 11.0–15.0)
WBC: 4.6 10*3/uL (ref 3.8–10.8)

## 2016-06-29 NOTE — Progress Notes (Signed)
Patient requested vitamin D level drawn today.  Advised patient we usually check vitamin D levels every 6 months, but patient would like level today.  Dr. Estanislado Pandy approved.  Patient is aware that vitamin D level may not be covered by his insurance.     Elisabeth Most, Pharm.D., BCPS, CPP Clinical Pharmacist Pager: [phone] Phone: [phone] 06/29/2016 2:57 PM

## 2016-06-29 NOTE — Telephone Encounter (Signed)
Ok , labs due now.

## 2016-06-29 NOTE — Telephone Encounter (Signed)
Last Visit: 02/09/16 Next Visit: 07/06/16 Labs: 03/21/16 WNL  Okay to refill MTX?

## 2016-06-30 LAB — VITAMIN D 25 HYDROXY (VIT D DEFICIENCY, FRACTURES): Vit D, 25-Hydroxy: 36 ng/mL (ref 30–100)

## 2016-06-30 NOTE — Progress Notes (Signed)
Vitamin D is stable, he can continue current dose of vitamin D

## 2016-06-30 NOTE — Progress Notes (Signed)
Glucose mildly elevated which was probably not fasting. Breasts the labs are stable.

## 2016-07-04 ENCOUNTER — Encounter: Payer: Self-pay | Admitting: Rheumatology

## 2016-07-04 ENCOUNTER — Ambulatory Visit (INDEPENDENT_AMBULATORY_CARE_PROVIDER_SITE_OTHER): Payer: Medicare Other | Admitting: Rheumatology

## 2016-07-04 ENCOUNTER — Other Ambulatory Visit: Payer: Self-pay | Admitting: *Deleted

## 2016-07-04 VITALS — BP 126/73 | HR 71 | Resp 13 | Ht 75.0 in | Wt 183.0 lb

## 2016-07-04 DIAGNOSIS — G8929 Other chronic pain: Secondary | ICD-10-CM

## 2016-07-04 DIAGNOSIS — C61 Malignant neoplasm of prostate: Secondary | ICD-10-CM

## 2016-07-04 DIAGNOSIS — M7551 Bursitis of right shoulder: Secondary | ICD-10-CM | POA: Diagnosis not present

## 2016-07-04 DIAGNOSIS — M810 Age-related osteoporosis without current pathological fracture: Secondary | ICD-10-CM

## 2016-07-04 DIAGNOSIS — M65332 Trigger finger, left middle finger: Secondary | ICD-10-CM | POA: Diagnosis not present

## 2016-07-04 DIAGNOSIS — G629 Polyneuropathy, unspecified: Secondary | ICD-10-CM

## 2016-07-04 DIAGNOSIS — E559 Vitamin D deficiency, unspecified: Secondary | ICD-10-CM | POA: Diagnosis not present

## 2016-07-04 DIAGNOSIS — M5134 Other intervertebral disc degeneration, thoracic region: Secondary | ICD-10-CM

## 2016-07-04 DIAGNOSIS — M353 Polymyalgia rheumatica: Secondary | ICD-10-CM

## 2016-07-04 DIAGNOSIS — M19041 Primary osteoarthritis, right hand: Secondary | ICD-10-CM

## 2016-07-04 DIAGNOSIS — M0609 Rheumatoid arthritis without rheumatoid factor, multiple sites: Secondary | ICD-10-CM | POA: Diagnosis not present

## 2016-07-04 DIAGNOSIS — M25511 Pain in right shoulder: Principal | ICD-10-CM

## 2016-07-04 DIAGNOSIS — M19042 Primary osteoarthritis, left hand: Secondary | ICD-10-CM

## 2016-07-04 DIAGNOSIS — Z79899 Other long term (current) drug therapy: Secondary | ICD-10-CM | POA: Diagnosis not present

## 2016-07-04 DIAGNOSIS — M5136 Other intervertebral disc degeneration, lumbar region: Secondary | ICD-10-CM

## 2016-07-06 ENCOUNTER — Ambulatory Visit: Payer: Medicare Other | Admitting: Rheumatology

## 2016-07-08 ENCOUNTER — Ambulatory Visit (HOSPITAL_COMMUNITY): Admission: RE | Admit: 2016-07-08 | Payer: Medicare Other | Source: Ambulatory Visit

## 2016-07-11 ENCOUNTER — Ambulatory Visit (HOSPITAL_COMMUNITY): Admission: RE | Admit: 2016-07-11 | Payer: Medicare Other | Source: Ambulatory Visit

## 2016-07-11 ENCOUNTER — Other Ambulatory Visit: Payer: Self-pay | Admitting: Rheumatology

## 2016-07-12 ENCOUNTER — Ambulatory Visit (HOSPITAL_COMMUNITY)
Admission: RE | Admit: 2016-07-12 | Discharge: 2016-07-12 | Disposition: A | Discharge status: Home / Self Care | Payer: Medicare Other | Source: Ambulatory Visit | Attending: Rheumatology | Admitting: Rheumatology

## 2016-07-12 DIAGNOSIS — M25511 Pain in right shoulder: Secondary | ICD-10-CM | POA: Diagnosis not present

## 2016-07-12 DIAGNOSIS — M7581 Other shoulder lesions, right shoulder: Secondary | ICD-10-CM | POA: Insufficient documentation

## 2016-07-12 DIAGNOSIS — G8929 Other chronic pain: Secondary | ICD-10-CM | POA: Insufficient documentation

## 2016-07-12 DIAGNOSIS — M12811 Other specific arthropathies, not elsewhere classified, right shoulder: Secondary | ICD-10-CM | POA: Insufficient documentation

## 2016-07-12 NOTE — Telephone Encounter (Signed)
Last Visit: 07/04/16 Next Visit: 01/24/17 Labs: 06/29/16 Mildly elevated glucose PLQ Eye exam: 08/06/15 WNL  Okay to refill PLQ?

## 2016-07-12 NOTE — Telephone Encounter (Signed)
ok 

## 2016-07-12 NOTE — Progress Notes (Signed)
Possible labral tear. Please advise patient to schedule appointment for Dr. Durward Fortes.

## 2016-07-22 ENCOUNTER — Encounter (INDEPENDENT_AMBULATORY_CARE_PROVIDER_SITE_OTHER): Payer: Self-pay | Admitting: Orthopaedic Surgery

## 2016-07-22 ENCOUNTER — Ambulatory Visit (INDEPENDENT_AMBULATORY_CARE_PROVIDER_SITE_OTHER): Payer: Medicare Other | Admitting: Orthopaedic Surgery

## 2016-07-22 VITALS — BP 124/71 | HR 82 | Ht 75.0 in | Wt 183.0 lb

## 2016-07-22 DIAGNOSIS — M25511 Pain in right shoulder: Secondary | ICD-10-CM | POA: Diagnosis not present

## 2016-07-22 DIAGNOSIS — G8929 Other chronic pain: Secondary | ICD-10-CM

## 2016-07-22 NOTE — Progress Notes (Signed)
Office Visit Note   Patient: Jeffery [name].           Date of Birth: 1934-07-28           MRN: [id number] Visit Date: 07/22/2016              Requested by: Shon Baton, MD [place], [place] [address] PCP: Precious Reel, MD   Assessment & Plan: Visit Diagnoses:  1. Chronic right shoulder pain    Rotator cuff tendinitis with degenerative  changes at the acromioclavicular joint Plan: Subacromial and acromioclavicular  joint injection first of week.Jeffery [name] has a golf tournament this weekend. He has chronic pain status post course of physical therapy.  Follow-Up Instructions: Return monday for right shoulder injection.   Orders:  No orders of the defined types were placed in this encounter.  No orders of the defined types were placed in this encounter.     Procedures: No procedures performed   Clinical Data: No additional findings.   Subjective: Chief Complaint  Patient presents with  . Right Shoulder - Pain, Results    Jeffery H Leah Skora. is a 82 y.o. male with history of rheumatoid arthritis and polymyalgia rheumatica. He is been on methotrexate for almost 6 months now. He has not had any flares without any joint swelling. He continues to have pain and discomfort in     Relates at least a one-year history of insidious onset of right shoulder pain. He is not experiencing any compromise of his daily activities except up with sleep. He will be awoken at night when he rolls on his right shoulder. Denies any obvious injury or trauma. He is being followed by Dr. Estanislado Pandy as identified above. Because of his persistent discomfort she suggested a course of physical therapy which has given him some relief of his pain. An MRI scan was performed on April 3. This scan demonstrates tendinopathy of the supra and infraspinatus tendons. The teres minor and subscapularis are intact. There is no muscle atrophy or abnormal signal of the muscles. Biceps long head is intact.  There is moderate arthropathy of the acromioclavicular joint with reactive marrow about the joint. There is some minimal spurring beneath the before meals joint and pressing on the myotendinous junction of the supraspinatus. Type I acromion. No joint effusion or chondral defect in the glenohumeral joint. Possible posterior labral tear  HPI  Review of Systems   Objective: Vital Signs: BP 124/71   Pulse 82   Ht 6\' 3"  (1.905 m)   Wt 183 lb (83 kg)   BMI 22.87 kg/m   Physical Exam  Ortho Exam right shoulder with full overhead motion. No pain with abduction. Normal internal/external rotation. Mild tenderness at the acromioclavicular joint with a positive crossarm test. No significant pain with impingement testing. Good strength. Biceps intact. Skin intact.  Specialty Comments:  No specialty comments available.  Imaging: No results found.   PMFS History: Patient Active Problem List   Diagnosis Date Noted  . Vitamin D deficiency 02/08/2016  . Trigger finger, left middle finger 02/08/2016  . High risk medication use 02/08/2016  . Bursitis of right shoulder 02/08/2016  . DDD (degenerative disc disease), lumbar 02/08/2016  . DDD (degenerative disc disease), thoracic 02/08/2016  . Rheumatoid arthritis of multiple sites without rheumatoid factor ([place]) 11/28/2015    Class: Chronic  . Other secondary osteoarthritis of both hands 11/28/2015    Class: Chronic  . PMR (polymyalgia rheumatica) (HCC) 11/28/2015  Class: Chronic  . Osteoporosis 11/28/2015    Class: Chronic  . Prostate cancer ([place]) 11/28/2015    Class: History of  . Duodenal ulcer 11/28/2015    Class: Chronic  . High cholesterol 11/28/2015  . Dizziness 07/16/2015  . Incomplete RBBB 03/31/2015  . Sleep apnea, primary central 03/03/2015  . Polyneuropathy ([place]) 01/24/2013  . Right inguinal hernia 07/05/2012   Past Medical History:  Diagnosis Date  . Arthritis   . BCC (basal cell carcinoma)   . BPH (benign prostatic  hypertrophy) 2008  . Cancer [facility]) 2008   Prostate  . Complex sleep apnea syndrome   . Diverticulosis   . ED (erectile dysfunction)   . GERD (gastroesophageal reflux disease)   . Hemorrhoids   . Hernia, inguinal   . Hyperlipidemia   . IFG (impaired fasting glucose)   . Osteopenia   . Peptic ulcer disease with hemorrhage 1993  . RBBB (right bundle branch block)   . Shingles   . Vitamin D deficiency     Family History  Problem Relation Age of Onset  . Heart failure Mother     Died age 85.  Apparently had valve surgery.   . Congenital heart disease Brother     Past Surgical History:  Procedure Laterality Date  . BACK SURGERY N/A 1984  . CATARACT EXTRACTION, BILATERAL    . HERNIA REPAIR    . LAMINECTOMY  1984   L 5  . PROSTATE SURGERY  12/2006   Robotic  . TONSILLECTOMY  82 years old  . TRIGGER FINGER RELEASE  03/01/2012   Procedure: MINOR RELEASE TRIGGER FINGER/A-1 PULLEY;  Surgeon: Cammie Sickle., MD;  Location: [facility];  Service: Orthopedics;  Laterality: Left;  long finger   Social History   Occupational History  . Not on file.   Social History Main Topics  . Smoking status: Never Smoker  . Smokeless tobacco: Never Used  . Alcohol use No  . Drug use: No  . Sexual activity: Not on file

## 2016-07-25 ENCOUNTER — Ambulatory Visit (INDEPENDENT_AMBULATORY_CARE_PROVIDER_SITE_OTHER): Payer: Medicare Other | Admitting: Orthopaedic Surgery

## 2016-07-25 DIAGNOSIS — G8929 Other chronic pain: Secondary | ICD-10-CM | POA: Diagnosis not present

## 2016-07-25 DIAGNOSIS — M25511 Pain in right shoulder: Secondary | ICD-10-CM

## 2016-07-25 MED ORDER — METHYLPREDNISOLONE ACETATE 40 MG/ML IJ SUSP
80.0000 mg | INTRAMUSCULAR | Status: AC | PRN
  Administered 2016-07-25: 80 mg

## 2016-07-25 MED ORDER — LIDOCAINE HCL 1 % IJ SOLN
2.0000 mL | INTRAMUSCULAR | Status: AC | PRN
  Administered 2016-07-25: 2 mL

## 2016-07-25 MED ORDER — BUPIVACAINE HCL 0.5 % IJ SOLN
2.0000 mL | INTRAMUSCULAR | Status: AC | PRN
  Administered 2016-07-25: 2 mL via INTRA_ARTICULAR

## 2016-07-25 NOTE — Progress Notes (Signed)
Office Visit Note   Patient: Jeffery [name].           Date of Birth: October 16, 1934           MRN: [id number] Visit Date: 07/25/2016              Requested by: Shon Baton, MD [place], [place] [address] PCP: Precious Reel, MD   Assessment & Plan: Visit Diagnoses:  1. Chronic right shoulder pain   Impingement with degenerative changes at the acromioclavicular joint  Plan: Office as needed limited activity for 2 days  Follow-Up Instructions: Return if symptoms worsen or fail to improve.   Orders:  No orders of the defined types were placed in this encounter.  No orders of the defined types were placed in this encounter.     Procedures: Large Joint Inj Date/Time: 07/25/2016 8:09 AM Performed by: Garald Balding Authorized by: Garald Balding   Consent Given by:  Patient Timeout: prior to procedure the correct patient, procedure, and site was verified   Indications:  Pain Location:  Shoulder Site:  L subacromial bursa Prep: patient was prepped and draped in usual sterile fashion   Needle Size:  25 G Needle Length:  1.5 inches Approach:  Lateral Ultrasound Guidance: No   Fluoroscopic Guidance: No   Arthrogram: No   Medications:  80 mg methylPREDNISolone acetate 40 MG/ML; 2 mL lidocaine 1 %; 2 mL bupivacaine 0.5 % Aspiration Attempted: No   Patient tolerance:  Patient tolerated the procedure well with no immediate complications     Clinical Data: No additional findings.   Subjective: No chief complaint on file. Jeffery [name] was seen Friday for evaluation of chronic right shoulder pain as outlined in my office note. I asked him to come in today for a subacromial cortisone injection as he was involved in a golf outing over the weekend and wanted to avoid shoulder pain related to the cortisone injection  HPI  Review of Systems   Objective: Vital Signs: There were no vitals taken for this visit.  Physical Exam  Ortho Exam full range of  motion of motion right shoulder little bit. Little bit of discomfort with overhead motion. Some localized tenderness at the acromioclavicular joint and the anterior subacromial region  Specialty Comments:  No specialty comments available.  Imaging: No results found.   PMFS History: Patient Active Problem List   Diagnosis Date Noted  . Vitamin D deficiency 02/08/2016  . Trigger finger, left middle finger 02/08/2016  . High risk medication use 02/08/2016  . Bursitis of right shoulder 02/08/2016  . DDD (degenerative disc disease), lumbar 02/08/2016  . DDD (degenerative disc disease), thoracic 02/08/2016  . Rheumatoid arthritis of multiple sites without rheumatoid factor ([place]) 11/28/2015    Class: Chronic  . Other secondary osteoarthritis of both hands 11/28/2015    Class: Chronic  . PMR (polymyalgia rheumatica) (HCC) 11/28/2015    Class: Chronic  . Osteoporosis 11/28/2015    Class: Chronic  . Prostate cancer ([place]) 11/28/2015    Class: History of  . Duodenal ulcer 11/28/2015    Class: Chronic  . High cholesterol 11/28/2015  . Dizziness 07/16/2015  . Incomplete RBBB 03/31/2015  . Sleep apnea, primary central 03/03/2015  . Polyneuropathy ([place]) 01/24/2013  . Right inguinal hernia 07/05/2012   Past Medical History:  Diagnosis Date  . Arthritis   . BCC (basal cell carcinoma)   . BPH (benign prostatic hypertrophy) 2008  . Cancer [facility]) 2008  Prostate  . Complex sleep apnea syndrome   . Diverticulosis   . ED (erectile dysfunction)   . GERD (gastroesophageal reflux disease)   . Hemorrhoids   . Hernia, inguinal   . Hyperlipidemia   . IFG (impaired fasting glucose)   . Osteopenia   . Peptic ulcer disease with hemorrhage 1993  . RBBB (right bundle branch block)   . Shingles   . Vitamin D deficiency     Family History  Problem Relation Age of Onset  . Heart failure Mother     Died age 48.  Apparently had valve surgery.   . Congenital heart disease Brother     Past  Surgical History:  Procedure Laterality Date  . BACK SURGERY N/A 1984  . CATARACT EXTRACTION, BILATERAL    . HERNIA REPAIR    . LAMINECTOMY  1984   L 5  . PROSTATE SURGERY  12/2006   Robotic  . TONSILLECTOMY  82 years old  . TRIGGER FINGER RELEASE  03/01/2012   Procedure: MINOR RELEASE TRIGGER FINGER/A-1 PULLEY;  Surgeon: Cammie Sickle., MD;  Location: [facility];  Service: Orthopedics;  Laterality: Left;  long finger   Social History   Occupational History  . Not on file.   Social History Main Topics  . Smoking status: Never Smoker  . Smokeless tobacco: Never Used  . Alcohol use No  . Drug use: No  . Sexual activity: Not on file

## 2016-08-01 ENCOUNTER — Telehealth (INDEPENDENT_AMBULATORY_CARE_PROVIDER_SITE_OTHER): Payer: Self-pay | Admitting: Orthopaedic Surgery

## 2016-08-01 NOTE — Telephone Encounter (Signed)
Patient called to inform Dr. Injections given on last visit helped. He has had good results. FYI

## 2016-08-01 NOTE — Telephone Encounter (Signed)
FYI relayed message to PW

## 2016-08-03 NOTE — Telephone Encounter (Signed)
Opened in error

## 2016-09-15 ENCOUNTER — Other Ambulatory Visit: Payer: Self-pay | Admitting: Rheumatology

## 2016-09-15 NOTE — Telephone Encounter (Signed)
ok 

## 2016-09-15 NOTE — Telephone Encounter (Signed)
Last Visit: 07/26/16 Next Visit: 01/24/17 Labs: 06/29/16 stable elevated glucose  Okay to refill MTX?

## 2016-10-05 ENCOUNTER — Other Ambulatory Visit: Payer: Self-pay | Admitting: Rheumatology

## 2016-10-05 NOTE — Telephone Encounter (Signed)
Last Visit: 07/26/16 Next Visit: 01/24/17 Labs: 06/29/16 stable elevated glucose  Okay to refill per Dr. Estanislado Pandy

## 2016-10-06 ENCOUNTER — Emergency Department (HOSPITAL_COMMUNITY): Payer: Medicare Other

## 2016-10-06 ENCOUNTER — Emergency Department (HOSPITAL_COMMUNITY)
Admission: EM | Admit: 2016-10-06 | Discharge: 2016-10-06 | Disposition: A | Discharge status: Home / Self Care | Payer: Medicare Other | Attending: Emergency Medicine | Admitting: Emergency Medicine

## 2016-10-06 ENCOUNTER — Encounter (HOSPITAL_COMMUNITY): Payer: Self-pay

## 2016-10-06 DIAGNOSIS — E860 Dehydration: Secondary | ICD-10-CM | POA: Diagnosis not present

## 2016-10-06 DIAGNOSIS — Z79899 Other long term (current) drug therapy: Secondary | ICD-10-CM | POA: Insufficient documentation

## 2016-10-06 DIAGNOSIS — Z8546 Personal history of malignant neoplasm of prostate: Secondary | ICD-10-CM | POA: Insufficient documentation

## 2016-10-06 DIAGNOSIS — R55 Syncope and collapse: Secondary | ICD-10-CM | POA: Diagnosis present

## 2016-10-06 DIAGNOSIS — R0789 Other chest pain: Secondary | ICD-10-CM

## 2016-10-06 LAB — CBC WITH DIFFERENTIAL/PLATELET
BASOS ABS: 0 10*3/uL (ref 0.0–0.1)
BASOS PCT: 0 %
Eosinophils Absolute: 0 10*3/uL (ref 0.0–0.7)
Eosinophils Relative: 0 %
HEMATOCRIT: 44.6 % (ref 39.0–52.0)
HEMOGLOBIN: 14.6 g/dL (ref 13.0–17.0)
Lymphocytes Relative: 17 %
Lymphs Abs: 1.4 10*3/uL (ref 0.7–4.0)
MCH: 30.8 pg (ref 26.0–34.0)
MCHC: 32.7 g/dL (ref 30.0–36.0)
MCV: 94.1 fL (ref 78.0–100.0)
MONO ABS: 0.3 10*3/uL (ref 0.1–1.0)
Monocytes Relative: 4 %
NEUTROS ABS: 6.4 10*3/uL (ref 1.7–7.7)
NEUTROS PCT: 79 %
Platelets: 222 10*3/uL (ref 150–400)
RBC: 4.74 MIL/uL (ref 4.22–5.81)
RDW: 12.4 % (ref 11.5–15.5)
WBC: 8.2 10*3/uL (ref 4.0–10.5)

## 2016-10-06 LAB — BASIC METABOLIC PANEL
ANION GAP: 6 (ref 5–15)
BUN: 12 mg/dL (ref 6–20)
CALCIUM: 8.9 mg/dL (ref 8.9–10.3)
CO2: 26 mmol/L (ref 22–32)
Chloride: 104 mmol/L (ref 101–111)
Creatinine, Ser: 0.66 mg/dL (ref 0.61–1.24)
Glucose, Bld: 123 mg/dL — ABNORMAL HIGH (ref 65–99)
Potassium: 4.1 mmol/L (ref 3.5–5.1)
Sodium: 136 mmol/L (ref 135–145)

## 2016-10-06 LAB — I-STAT TROPONIN, ED: TROPONIN I, POC: 0 ng/mL (ref 0.00–0.08)

## 2016-10-06 NOTE — ED Provider Notes (Signed)
[facility] DEPT Provider Note   CSN: [id number] Arrival date & time: 10/06/16  1334     History   Chief Complaint Chief Complaint  Patient presents with  . Near Syncope    HPI Jeffery [name]. is a 82 y.o. male.  HPI   Was outside in heat, walked back inside, and started to feel "uneasy", then felt lightheaded, diaphoretic, he slumped over and wife helped hold him up.  Typically drinks water frequently, but today did not from 8AM until this occurred.  No acute pain at any time. EMS had given him saline and got him wet.  Now since being here has had tightness, hoping it was muscular. Reports if he was home he would be trying to work it out with massage, has hx of fibromyalgia.  Feel like have swollen lymph node on left side by left ear and has had periodic tingling over the area which is momentary.  Tightness in shoulder, chest and back, has had similar symptoms prior to today, takes plaquenil and methotrexate, self-massaging, thinks MSK issues for which he sees rheumatology. Reports exertion makes it better, walking and stretching helps. 2 years ago had black out without other warning, had work up with Cardiology/neurology. Normal stress test at that time.  No shortness of breath, no chest pain, no nausea. Wife thinks he may have passed out as she was holding him.    Past Medical History:  Diagnosis Date  . Arthritis   . BCC (basal cell carcinoma)   . BPH (benign prostatic hypertrophy) 2008  . Cancer [facility]) 2008   Prostate  . Complex sleep apnea syndrome   . Diverticulosis   . ED (erectile dysfunction)   . GERD (gastroesophageal reflux disease)   . Hemorrhoids   . Hernia, inguinal   . Hyperlipidemia   . IFG (impaired fasting glucose)   . Osteopenia   . Peptic ulcer disease with hemorrhage 1993  . RBBB (right bundle branch block)   . Shingles   . Vitamin D deficiency     Patient Active Problem List   Diagnosis Date Noted  . Vitamin D deficiency 02/08/2016  .  Trigger finger, left middle finger 02/08/2016  . High risk medication use 02/08/2016  . Bursitis of right shoulder 02/08/2016  . DDD (degenerative disc disease), lumbar 02/08/2016  . DDD (degenerative disc disease), thoracic 02/08/2016  . Rheumatoid arthritis of multiple sites without rheumatoid factor ([place]) 11/28/2015    Class: Chronic  . Other secondary osteoarthritis of both hands 11/28/2015    Class: Chronic  . PMR (polymyalgia rheumatica) (HCC) 11/28/2015    Class: Chronic  . Osteoporosis 11/28/2015    Class: Chronic  . Prostate cancer ([place]) 11/28/2015    Class: History of  . Duodenal ulcer 11/28/2015    Class: Chronic  . High cholesterol 11/28/2015  . Dizziness 07/16/2015  . Incomplete RBBB 03/31/2015  . Sleep apnea, primary central 03/03/2015  . Polyneuropathy ([place]) 01/24/2013  . Right inguinal hernia 07/05/2012    Past Surgical History:  Procedure Laterality Date  . BACK SURGERY N/A 1984  . CATARACT EXTRACTION, BILATERAL    . HERNIA REPAIR    . LAMINECTOMY  1984   L 5  . PROSTATE SURGERY  12/2006   Robotic  . TONSILLECTOMY  82 years old  . TRIGGER FINGER RELEASE  03/01/2012   Procedure: MINOR RELEASE TRIGGER FINGER/A-1 PULLEY;  Surgeon: Cammie Sickle., MD;  Location: [facility];  Service: Orthopedics;  Laterality: Left;  long finger       Home Medications    Prior to Admission medications   Medication Sig Start Date End Date Taking? Authorizing Provider  acetaminophen (TYLENOL) 500 MG tablet Take 500 mg by mouth every 6 (six) hours as needed.    [provider]  alendronate (FOSAMAX) 70 MG tablet take 1 tablet by mouth every week 06/13/16   Bo Merino, MD  Calcium Carbonate (CALCIUM 600 PO) Take 600 mg by mouth daily.    [provider]  Cholecalciferol (VITAMIN D3) 2000 units capsule Take 2,000 Units by mouth daily.    [provider]  Coenzyme Q10 200 MG capsule Take 200 mg by mouth 2 (two) times daily.      [provider]  colesevelam (WELCHOL) 625 MG tablet Take 1,250 mg by mouth 3 (three) times daily.     [provider]  D3-50 50000 units capsule take 1 capsule by mouth ONCE A MONTH 11/19/15   [provider]  diclofenac sodium (VOLTAREN) 1 % GEL Apply 4 g topically as needed. 02/09/16   Bo Merino, MD  folic acid (FOLVITE) 1 MG tablet Take 1 mg by mouth daily.    [provider]  hydroxychloroquine (PLAQUENIL) 200 MG tablet take 1 tablet by mouth twice a day 07/12/16   Bo Merino, MD  methotrexate (RHEUMATREX) 2.5 MG tablet take 6 tablets by mouth every week 09/15/16   Bo Merino, MD  omega-3 acid ethyl esters (LOVAZA) 1 g capsule take 1 capsule by mouth twice a day 05/03/16   Bo Merino, MD  predniSONE (DELTASONE) 5 MG tablet Take 4 tab for 4 days, then 3 tab for 4 days, then 2 tab for 4 days, then 1 tab for 4 days then 1/2 tab for 4 days if needed for flare 02/09/16   Bo Merino, MD  Vitamin D, Ergocalciferol, (DRISDOL) 50000 units CAPS capsule Take 1 capsule (50,000 Units total) by mouth every 30 (thirty) days. 03/22/16   Bo Merino, MD  vitamin E 100 UNIT capsule Take 100 Units by mouth daily.    [provider]    Family History Family History  Problem Relation Age of Onset  . Heart failure Mother        Died age 51.  Apparently had valve surgery.   . Congenital heart disease Brother     Social History Social History  Substance Use Topics  . Smoking status: Never Smoker  . Smokeless tobacco: Never Used  . Alcohol use Not on file     Allergies   Sulfasalazine and Demerol [meperidine]   Review of Systems Review of Systems  Constitutional: Negative for fever.  HENT: Negative for sore throat.   Eyes: Negative for visual disturbance.  Respiratory: Negative for cough and shortness of breath.   Cardiovascular: Positive for chest pain.  Gastrointestinal: Negative for abdominal pain, diarrhea,  nausea and vomiting.  Genitourinary: Negative for difficulty urinating.  Musculoskeletal: Negative for back pain and neck stiffness.  Skin: Negative for rash.  Neurological: Positive for light-headedness. Negative for syncope, facial asymmetry, speech difficulty, weakness, numbness and headaches.     Physical Exam Updated Vital Signs BP 136/65   Pulse 80   Temp 97.7 F (36.5 C) (Oral)   Resp 17   Ht 6\' 2"  (1.88 m)   Wt 82.6 kg (182 lb)   SpO2 96%   BMI 23.37 kg/m   Physical Exam  Constitutional: He is oriented to person, place, and time. He appears well-developed  and well-nourished. No distress.  HENT:  Head: Normocephalic and atraumatic.  Eyes: Conjunctivae and EOM are normal.  Neck: Normal range of motion.  Cardiovascular: Normal rate, regular rhythm, normal heart sounds and intact distal pulses.  Exam reveals no gallop and no friction rub.   No murmur heard. Pulmonary/Chest: Effort normal and breath sounds normal. No respiratory distress. He has no wheezes. He has no rales.  Abdominal: Soft. He exhibits no distension. There is no tenderness. There is no guarding.  Musculoskeletal: He exhibits no edema.  Neurological: He is alert and oriented to person, place, and time. He has normal strength. No cranial nerve deficit or sensory deficit. Coordination normal. GCS eye subscore is 4. GCS verbal subscore is 5. GCS motor subscore is 6.  Skin: Skin is warm and dry. He is not diaphoretic.  Nursing note and vitals reviewed.    ED Treatments / Results  Labs (all labs ordered are listed, but only abnormal results are displayed) Labs Reviewed  BASIC METABOLIC PANEL - Abnormal; Notable for the following:       Result Value   Glucose, Bld 123 (*)    All other components within normal limits  CBC WITH DIFFERENTIAL/PLATELET  Randolm Idol, ED    EKG  EKG Interpretation  Date/Time:  Thursday October 06 2016 13:36:18 EDT Ventricular Rate:  79 PR Interval:    QRS  Duration: 147 QT Interval:  446 QTC Calculation: 512 R Axis:   -59 Text Interpretation:  Sinus rhythm Borderline prolonged PR interval Probable left atrial enlargement RBBB and LAFB Left ventricular hypertrophy Inferior infarct, acute Lateral leads are also involved Left anterior fasicular block New since previous tracing Confirmed by Orlie Dakin [phone]) on 10/06/2016 2:04:06 PM Also confirmed by Gareth Morgan [phone])  on 10/06/2016 5:45:01 PM       Radiology Dg Chest 2 View  Result Date: 10/06/2016 CLINICAL DATA:  Heavy sweating and lightheadedness EXAM: CHEST  2 VIEW COMPARISON:  12/11/2007 FINDINGS: Slight elevation of the left diaphragm. No acute infiltrate or effusion. Normal heart size. Minimal atherosclerotic calcification. No pneumothorax. Mild degenerative changes of the spine. IMPRESSION: No active cardiopulmonary disease. Electronically Signed   By: Donavan Foil M.D.   On: 10/06/2016 18:54    Procedures Procedures (including critical care time)  Medications Ordered in ED Medications - No data to display   Initial Impression / Assessment and Plan / ED Course  I have reviewed the triage vital signs and the nursing notes.  Pertinent labs & imaging results that were available during my care of the patient were reviewed by me and considered in my medical decision making (see chart for details).     82yo male with history of hyperlipidemia, prostate cancer, polymyalgia rheumatica, rheumatoid arthritis who presents with concern for episode of near-syncope.  EKG without acute changes to explain near-syncopal incident and patient without signs of arrhythmia on telemetry monitoring.  Hgb WNL, no hx to suggest GI bleed. Electrolytes WNL. Patient denies chest pain or dyspnea during or before incident, and have low suspicion for PE or ACS as etiology of near-syncope.  Reports not drinking fluids and being outside, and overall feel near-syncopal or possible syncopal episode likely  dehydration and vasovagal.  Given IV fluids with EMS, tolerating po fluids in ED.  Reports sensation of chest tightness developing after being in ED, similar to prior episodes he attributes to other muscular pain.  Episodes are nonexertional and have had work up in the past.  Troponin drawn greater than 6hr  after development of symptoms and negative. Doubt ACS.  Discussed possibility of observation for near syncope and development of chest pain, however pt reports the chest symptoms have been chronic and evaluated and feel outpatient follow up is reasonable.  Recommend PCP follow up, possible holter monitoring, cardiology follow up. Patient discharged in stable condition with understanding of reasons to return.   Final Clinical Impressions(s) / ED Diagnoses   Final diagnoses:  Near syncope  Dehydration  Chest tightness    New Prescriptions Discharge Medication List as of 10/06/2016  9:08 PM       Gareth Morgan, MD 10/07/16 1257

## 2016-10-06 NOTE — ED Notes (Signed)
Patient Alert and oriented X4. Stable and ambulatory. Patient verbalized understanding of the discharge instructions.  Patient belongings were taken by the patient.  

## 2016-10-06 NOTE — ED Triage Notes (Signed)
Pt BIB [facility] from home where pt had a near syncopal episode after being outside for about 45 min. EMS reports pt was very diaphoretic and pale upon arrival. Pt given 500 cc NS PTA. Pt presents A&OX4 c/o being thirsty.

## 2016-10-07 ENCOUNTER — Encounter: Payer: Self-pay | Admitting: Physician Assistant

## 2016-10-07 ENCOUNTER — Ambulatory Visit (INDEPENDENT_AMBULATORY_CARE_PROVIDER_SITE_OTHER): Payer: Medicare Other | Admitting: Physician Assistant

## 2016-10-07 ENCOUNTER — Telehealth: Payer: Self-pay | Admitting: Cardiology

## 2016-10-07 VITALS — BP 127/77 | HR 69 | Ht 74.0 in | Wt 181.2 lb

## 2016-10-07 DIAGNOSIS — R42 Dizziness and giddiness: Secondary | ICD-10-CM

## 2016-10-07 DIAGNOSIS — E785 Hyperlipidemia, unspecified: Secondary | ICD-10-CM | POA: Diagnosis not present

## 2016-10-07 NOTE — Progress Notes (Signed)
Cardiology Office Note    Date:  10/08/2016   ID:  Jeffery Jah Alarid., DOB [birth year], MRN [id number]  PCP:  Shon Baton, MD  Cardiologist:  Dr. Percival Spanish  Chief Complaint  Patient presents with  . Follow-up    seen for Dr. Percival Spanish    History of Present Illness:  Jeffery H Marquay Kruse. is a 82 y.o. male with PMH of BPH, RBBB, HLD and h/o dizziness. Echocardiogram obtained on 03/27/2015 showed EF 42-59%, grade 1 diastolic dysfunction, mildly dilated ascending aorta. Carotid Doppler obtained in December 2016 showed 1-39% stenosis bilaterally. He underwent evaluation by Dr. Percival Spanish for dizziness at the time who felt his dizziness is unlikely related to cardiac issue. ETT obtained on 08/06/2015 demonstrated a hypertensive response to exercise, no ST-T wave changes during the stress test.  He presented to the ED yesterday on 10/06/2016 with presyncope. He says in the past year and half, he has had 3 episodes of near syncope. He was evaluated by Dr. Percival Spanish for the first episode. Late last year, he had another episode. He described the episode as brief loss of control preceded by dizziness. He never truly hit the ground. More recently, he says he was out on a hot day. He was not drinking any fluid that morning. He became very weak, diaphoretic, had some flushing sensation. His wife also noticed he was very pale. He checked his pulse and noticed his heart rate was in the 40s, he does not know his blood pressure. He never had dizziness on average other than those 3 episodes. I think his symptom was more consistent with a vasovagal episode, however cannot rule out arrhythmia as the cause. Considering the previous episode was more than 6 month ago, I do not think there is any benefit of a Holter or event monitor unless symptoms recur. If the symptoms does recur, I would recommend go directly for loop recorder. For now, his symptom is very unlikely to be ischemic related, I would not recommend any ischemic  workup at this time.   Past Medical History:  Diagnosis Date  . Arthritis   . BCC (basal cell carcinoma)   . BPH (benign prostatic hypertrophy) 2008  . Cancer [facility]) 2008   Prostate  . Complex sleep apnea syndrome   . Diverticulosis   . ED (erectile dysfunction)   . GERD (gastroesophageal reflux disease)   . Hemorrhoids   . Hernia, inguinal   . Hyperlipidemia   . IFG (impaired fasting glucose)   . Osteopenia   . Peptic ulcer disease with hemorrhage 1993  . RBBB (right bundle branch block)   . Shingles   . Vitamin D deficiency     Past Surgical History:  Procedure Laterality Date  . BACK SURGERY N/A 1984  . CATARACT EXTRACTION, BILATERAL    . HERNIA REPAIR    . LAMINECTOMY  1984   L 5  . PROSTATE SURGERY  12/2006   Robotic  . TONSILLECTOMY  82 years old  . TRIGGER FINGER RELEASE  03/01/2012   Procedure: MINOR RELEASE TRIGGER FINGER/A-1 PULLEY;  Surgeon: Cammie Sickle., MD;  Location: [place];  Service: Orthopedics;  Laterality: Left;  long finger    Current Medications: Outpatient Medications Prior to Visit  Medication Sig Dispense Refill  . acetaminophen (TYLENOL) 500 MG tablet Take 500 mg by mouth every 6 (six) hours as needed.    Marland Kitchen alendronate (FOSAMAX) 70 MG tablet take 1 tablet by mouth every week 12 tablet  0  . Calcium Carbonate (CALCIUM 600 PO) Take 600 mg by mouth daily.    . Cholecalciferol (VITAMIN D3) 2000 units capsule Take 2,000 Units by mouth daily.    . Coenzyme Q10 200 MG capsule Take 200 mg by mouth 2 (two) times daily.     . colesevelam (WELCHOL) 625 MG tablet Take 1,250 mg by mouth 3 (three) times daily.     . D3-50 50000 units capsule take 1 capsule by mouth ONCE A MONTH  0  . diclofenac sodium (VOLTAREN) 1 % GEL Apply 4 g topically as needed. 5 Tube 0  . folic acid (FOLVITE) 1 MG tablet Take 1 mg by mouth daily.    . hydroxychloroquine (PLAQUENIL) 200 MG tablet take 1 tablet by mouth twice a day 180 tablet 0  . methotrexate  (RHEUMATREX) 2.5 MG tablet take 6 tablets by mouth every week 72 tablet 0  . omega-3 acid ethyl esters (LOVAZA) 1 g capsule take 1 capsule by mouth twice a day 60 capsule 11  . predniSONE (DELTASONE) 5 MG tablet Take 4 tab for 4 days, then 3 tab for 4 days, then 2 tab for 4 days, then 1 tab for 4 days then 1/2 tab for 4 days if needed for flare 42 tablet 0  . Vitamin D, Ergocalciferol, (DRISDOL) 50000 units CAPS capsule Take 1 capsule (50,000 Units total) by mouth every 30 (thirty) days. 3 capsule 4  . vitamin E 100 UNIT capsule Take 100 Units by mouth daily.    Marland Kitchen alendronate (FOSAMAX) 70 MG tablet take 1 tablet by mouth every week 12 tablet 0  . Melatonin 5 MG TABS Take by mouth.    Marland Kitchen UNABLE TO FIND Med Name: Tumeric     No facility-administered medications prior to visit.      Allergies:   Sulfasalazine and Demerol [meperidine]   Social History   Social History  . Marital status: Married    Spouse name: N/A  . Number of children: 2  . Years of education: N/A   Social History Main Topics  . Smoking status: Never Smoker  . Smokeless tobacco: Never Used  . Alcohol use None  . Drug use: No  . Sexual activity: Not Asked   Other Topics Concern  . None   Social History Narrative   Right handed, Retired .Married, 2 kids. Caffeine none.   3 Step      Family History:  The patient's family history includes Congenital heart disease in his brother; Heart failure in his mother.   ROS:   Please see the history of present illness.    ROS All other systems reviewed and are negative.   PHYSICAL EXAM:   VS:  BP 127/77   Pulse 69   Ht 6\' 2"  (1.88 m)   Wt 181 lb 3.2 oz (82.2 kg)   BMI 23.26 kg/m    GEN: Well nourished, well developed, in no acute distress  HEENT: normal  Neck: no JVD, carotid bruits, or masses Cardiac: RRR; no murmurs, rubs, or gallops,no edema  Respiratory:  clear to auscultation bilaterally, normal work of breathing GI: soft, nontender, nondistended, + BS MS:  no deformity or atrophy  Skin: warm and dry, no rash Neuro:  Alert and Oriented x 3, Strength and sensation are intact Psych: euthymic mood, full affect  Wt Readings from Last 3 Encounters:  10/07/16 181 lb 3.2 oz (82.2 kg)  10/06/16 182 lb (82.6 kg)  07/22/16 183 lb (83 kg)  Studies/Labs Reviewed:   EKG:  EKG is not ordered today.   Recent Labs: 06/29/2016: ALT 19 10/06/2016: BUN 12; Creatinine, Ser 0.66; Hemoglobin 14.6; Platelets 222; Potassium 4.1; Sodium 136   Lipid Panel No results found for: CHOL, TRIG, HDL, CHOLHDL, VLDL, LDLCALC, LDLDIRECT  Additional studies/ records that were reviewed today include:   Echo 03/27/2015 LV EF: 55% -   60%  Study Conclusions  - Left ventricle: The cavity size was normal. Wall thickness was   increased in a pattern of mild LVH. Systolic function was normal.   The estimated ejection fraction was in the range of 55% to 60%.   Wall motion was normal; there were no regional wall motion   abnormalities. Doppler parameters are consistent with abnormal   left ventricular relaxation (grade 1 diastolic dysfunction). - Aortic root: The aortic root was mildly dilated. - Ascending aorta: The ascending aorta was mildly dilated. - Mitral valve: Calcified annulus.  Impressions:  - Normal LV systolic function; grade 1 diastolic dysfunction; mild   LVH; sclerotic aortic valve; mildly dilated aortic root and   ascending aorta.   Myoview 08/06/2015 Study Highlights    Blood pressure poorly-controlled.  Blood pressure demonstrated a hypertensive response to exercise.  There was no ST segment deviation noted during stress.  No T wave inversion was noted during stress.  Negative, adequate stress test.     ASSESSMENT:    1. Dizziness of unknown cause   2. Hyperlipidemia, unspecified hyperlipidemia type      PLAN:  In order of problems listed above:  1. Dizziness: He has only had 3 episode of presyncope in the past year and  half, the previous episode was more than 6 month ago. His symptom was not occurring with exertion. Most recent episode sounds more vasovagal in nature. Given the infrequency of the symptom and the lack of dizziness in between, I do not think a Holter monitor or event monitor would suffice in this case. My recommendation is to continue observation for now, however if he has any recurrence of the symptom, consider loop recorder.  2. Hyperlipidemia: on Welchol    Medication Adjustments/Labs and Tests Ordered: Current medicines are reviewed at length with the patient today.  Concerns regarding medicines are outlined above.  Medication changes, Labs and Tests ordered today are listed in the Patient Instructions below. Patient Instructions   Follow-Up:  With Dr. Percival Spanish in 3 months.  Please keep well-hydrated and keep a journal/log of your blood pressures and pulse at home.  If you need a refill on your cardiac medications before your next appointment, please call your pharmacy.      Hilbert Corrigan, [place]  10/08/2016 1:58 PM    [facility] [place], [place], [place]  [address] Phone: [phone]; Fax: [phone]

## 2016-10-07 NOTE — Telephone Encounter (Signed)
Spoke with patient and he was seen in ED last night for near syncope. Stated heart rate was in the 40's and was told needed to be seen by cardiology for possible monitor/evaluation. Scheduled appointment with Estrella Myrtle PA today at 11:00 am

## 2016-10-07 NOTE — Patient Instructions (Signed)
  Follow-Up:  With Dr. Percival Spanish in 3 months.  Please keep well-hydrated and keep a journal/log of your blood pressures and pulse at home.  If you need a refill on your cardiac medications before your next appointment, please call your pharmacy.

## 2016-10-08 ENCOUNTER — Encounter: Payer: Self-pay | Admitting: Physician Assistant

## 2016-10-08 ENCOUNTER — Other Ambulatory Visit: Payer: Self-pay | Admitting: Rheumatology

## 2016-10-08 LAB — CBC WITH DIFFERENTIAL/PLATELET
BASOS ABS: 0 {cells}/uL (ref 0–200)
BASOS PCT: 0 %
EOS ABS: 98 {cells}/uL (ref 15–500)
Eosinophils Relative: 2 %
HEMATOCRIT: 47.2 % (ref 38.5–50.0)
Hemoglobin: 15.6 g/dL (ref 13.2–17.1)
LYMPHS PCT: 36 %
Lymphs Abs: 1764 cells/uL (ref 850–3900)
MCH: 31.8 pg (ref 27.0–33.0)
MCHC: 33.1 g/dL (ref 32.0–36.0)
MCV: 96.1 fL (ref 80.0–100.0)
MONO ABS: 441 {cells}/uL (ref 200–950)
MONOS PCT: 9 %
MPV: 9.1 fL (ref 7.5–12.5)
NEUTROS PCT: 53 %
Neutro Abs: 2597 cells/uL (ref 1500–7800)
PLATELETS: 258 10*3/uL (ref 140–400)
RBC: 4.91 MIL/uL (ref 4.20–5.80)
RDW: 13.2 % (ref 11.0–15.0)
WBC: 4.9 10*3/uL (ref 3.8–10.8)

## 2016-10-08 LAB — COMPLETE METABOLIC PANEL WITH GFR
ALT: 15 U/L (ref 9–46)
AST: 8 U/L — AB (ref 10–35)
Albumin: 4.4 g/dL (ref 3.6–5.1)
Alkaline Phosphatase: 60 U/L (ref 40–115)
BILIRUBIN TOTAL: 0.7 mg/dL (ref 0.2–1.2)
BUN: 13 mg/dL (ref 7–25)
CHLORIDE: 100 mmol/L (ref 98–110)
CO2: 30 mmol/L (ref 20–31)
CREATININE: 0.76 mg/dL (ref 0.70–1.18)
Calcium: 9.3 mg/dL (ref 8.6–10.3)
GFR, Est Non African American: 87 mL/min (ref >=60)
GLUCOSE: 186 mg/dL — AB (ref 65–99)
Potassium: 4.5 mmol/L (ref 3.5–5.3)
SODIUM: 138 mmol/L (ref 135–146)
TOTAL PROTEIN: 6.8 g/dL (ref 6.1–8.1)

## 2016-10-10 NOTE — Progress Notes (Signed)
Glu elevated , rest is normal

## 2016-10-10 NOTE — Telephone Encounter (Signed)
Last Visit: 07/26/16 Next Visit: 01/24/17  Okay to refill per Dr. Deveshwar 

## 2016-10-13 ENCOUNTER — Other Ambulatory Visit: Payer: Self-pay | Admitting: Rheumatology

## 2016-10-13 NOTE — Telephone Encounter (Signed)
Last Visit: 07/26/16 Next Visit: 01/24/17 Labs: 10/08/16 Elevated glucose rest WNL PLQ Eye exam: 08/08/16 WNL  Okay to refill per Dr. Estanislado Pandy

## 2016-10-20 ENCOUNTER — Telehealth: Payer: Self-pay | Admitting: Radiology

## 2016-10-20 NOTE — Telephone Encounter (Signed)
-----   Message from Bo Merino, MD sent at 10/10/2016  8:13 AM EDT ----- Glu elevated , rest is normal

## 2016-10-20 NOTE — Telephone Encounter (Signed)
I have called patient to advise labs are normal except for elevated glucose.

## 2016-10-31 ENCOUNTER — Telehealth: Payer: Self-pay | Admitting: Neurology

## 2016-10-31 NOTE — Telephone Encounter (Signed)
Pt calling to inform that for about 10 days now he has had an unexplainable numbness in his neck on the left side from his earlobe down.  Pt wanted to schedule an appointment for either this afternoon or tomorrow.  Pt was made aware that we do not offer same day appointments.  Pt was encouraged to use ED if pain is concerning to that degree.  Pt states that it is not so much concerning, he is going out of town on 7-24 and will not be back until 8-16.  Pt has asked this message be sent to see if there is anyway he can be seen on tomorrow.  Please call

## 2016-10-31 NOTE — Telephone Encounter (Signed)
Dr Dohmeier agreed to see pt tomorrow on 11/01/2016 at 3:30pm

## 2016-11-01 ENCOUNTER — Encounter: Payer: Self-pay | Admitting: Neurology

## 2016-11-01 ENCOUNTER — Ambulatory Visit (INDEPENDENT_AMBULATORY_CARE_PROVIDER_SITE_OTHER): Payer: Medicare Other | Admitting: Neurology

## 2016-11-01 VITALS — BP 130/72 | HR 83 | Ht 74.0 in | Wt 184.5 lb

## 2016-11-01 DIAGNOSIS — R209 Unspecified disturbances of skin sensation: Secondary | ICD-10-CM

## 2016-11-01 DIAGNOSIS — M5030 Other cervical disc degeneration, unspecified cervical region: Secondary | ICD-10-CM | POA: Diagnosis not present

## 2016-11-01 NOTE — Patient Instructions (Signed)

## 2016-11-01 NOTE — Progress Notes (Signed)
[name]  Provider:  Larey Seat, M D  Referring Provider: Shon Baton, MD Primary Care Physician:  Shon Baton, MD  Chief Complaint  Patient presents with  . Other    fit in pt for numbness neck and behind left ear not consistent comes and goes.    HPI:  11-01-2016.  Jeffery [name]. is a 82 y.o. male seen here as a revisit  from Dr. Virgina Jock for a newly noted area of dysesthesia behind the left ear.  There is no numbness- the skin sensation is intermittently disturbed, tingly, like a fine touch. I evaluated the area and it does not quite fit a dermatomal distribution- Area his retroauricular and would be innervated by C2. There is no spasm.   This patient of Dr. Virgina Jock Dr. Estanislado Pandy has followed up since May 2011. Initially he was referred for a polysomnogram based on chronic fatigue complaints and was diagnosed as complex apnea.  The apnea short-billed complements central and obstructive, but in a distribution that was more typical for a with apnea.  They were neuropathy related labs sent out and 2011 as well as also to address chronic fatigue and all returned to normal range. The patient clearly has an accentuated AHI by position and during REM sleep. He has been healthy and maintains an active lifestyle is traveling is a nonsmoker nondrinker and has nausea history. The patient responded well to Wellbutrin on only 100 mg daily taken to eliminate or reduce central apneas. During his last visit in 2013 his fatigue score was 20 and his Epworth score 14 points and he had reported one nocturia on average per night. He estimated his daily sleep time is 7 hours or more. He sometimes naps in the afternoons. This is still the case today:  his Epworth is 11 points, and fatigue score is 24. I would like to add that the patient is on chronic Plaquenil therapy. This was initiated after he developed severe muscle stiffness and relief this is the symptoms within 2-3 weeks. 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 Plaquenil  may contribute somewhat to the development of the neuropathy. Wellbutrin has had some positive effect on the level of alertness and daytime and may also be due discomfort.  03-03-15 Jeffery [name] reports that he had taken a drug holiday and for about 6 months did not use Plaquenil. As the responds he noted again stiffness in his hands and inflammation. Dr. Estanislado Pandy , his rheumatologist, had placed him on his prednisone taper but then Plaquenil was reinitiated about 3 months ago. I would like to add that the stiffness has never affected his lower extremities or feet and has always been manifested in his hands. He also has 3 trigger fingers. Fatigue is stable. His Epworth score is endorsed at 9 points and his fatigue severity at 34 points. The geriatric depression score was  endorsed at 2 points.  We are seeing today after Jeffery [name] suffered a sudden fall with a laceration of the left for head and fracture of the left thumb. He was walking briskly but on an uneven surface and initially believed that he tripped and fell. But he had as sudden premonition to slow down,  before he fell which tells me that he most likely fainted. He has no history of seizures, he was able to break his fall but had no focal deficits [name] is a fall and also did not  feel that he had problems was generating words, but his thought process was scrambled or that he had any focal motor deficits. This rules out a TIA.  Once he was on the ground he was immediately conscious and aware of his surroundings- he was not combative and he will actually waited patiently for EMS to arrive. The ED reports were reviewed here with him. A cardiac workup was initiated and his transthoracic echo cardiogram revealed no abnormalities.  He also underwent carotid Doppler studies which showed minor in my  opinion trivial calcifications at the bifurcation. These would be in the range between 1 and 40% the nonsurgical indication range. I would like for the patient to take a baby aspirin is possibly enteric coated once a day should he develop bruising I will like him to take it every other day. I also discussed with him that we have never established at baseline for his neuropathy. I would like for him as early as available in the new year to undergo an EMG and nerve conduction study to evaluate the degree of peripheral neuropathy and axonal versus demyelinating quality of the neuropathy. The patient is interested. A recent comprehensive metabolic panel was Dr. Franchot Gallo was interpreted as normal , as well as a CBC diff  which she has every 3 months.  03-01-2016, [name] report some tingling in his right foot plantar and dorsal at the midfoot level. This is not painful but is most likely a manifestation of his neuropathy. He has remains with good range of motion, has not gained weight. He has been stable and feeling well. Only refills today.     Review of Systems: Out of a complete 14 system review, the patient complains of only the following symptoms, and all other reviewed systems are negative. Intermittent fatigue. Fatigue is stable. His Epworth score is endorsed at 9 points and his fatigue severity at 34 points. The geriatric depression score was  endorsed at 2 points. night blindness.     Dysesthesias.   Social History   Social History  . Marital status: Married    Spouse name: N/A  . Number of children: 2  . Years of education: N/A   Occupational History  . Not on file.   Social History Main Topics  . Smoking status: Never Smoker  . Smokeless tobacco: Never Used  . Alcohol use Not on file  . Drug use: No  . Sexual activity: Not on file   Other Topics Concern  . Not on file   Social History Narrative   Right handed, Retired .Married, 2 kids. Caffeine none.   3 Step      Family History  Problem Relation Age of Onset  . Heart failure Mother        Died age 54.  Apparently had valve surgery.   . Congenital heart disease Brother     Past Medical History:  Diagnosis Date  . Arthritis   . BCC (basal cell carcinoma)   . BPH (benign prostatic hypertrophy) 2008  . Cancer [facility]) 2008   Prostate  . Complex sleep apnea syndrome   . Diverticulosis   . ED (erectile dysfunction)   . GERD (gastroesophageal reflux disease)   . Hemorrhoids   . Hernia, inguinal   . Hyperlipidemia   . IFG (impaired fasting glucose)   . Osteopenia   . Peptic ulcer disease with hemorrhage 1993  . RBBB (right bundle branch block)   . Shingles   . Vitamin D deficiency  Past Surgical History:  Procedure Laterality Date  . BACK SURGERY N/A 1984  . CATARACT EXTRACTION, BILATERAL    . HERNIA REPAIR    . LAMINECTOMY  1984   L 5  . PROSTATE SURGERY  12/2006   Robotic  . TONSILLECTOMY  82 years old  . TRIGGER FINGER RELEASE  03/01/2012   Procedure: MINOR RELEASE TRIGGER FINGER/A-1 PULLEY;  Surgeon: Cammie Sickle., MD;  Location: [place];  Service: Orthopedics;  Laterality: Left;  long finger    Current Outpatient Prescriptions  Medication Sig Dispense Refill  . acetaminophen (TYLENOL) 500 MG tablet Take 500 mg by mouth every 6 (six) hours as needed.    Marland Kitchen alendronate (FOSAMAX) 70 MG tablet take 1 tablet by mouth every week 12 tablet 0  . Calcium Carbonate (CALCIUM 600 PO) Take 600 mg by mouth daily.    . Coenzyme Q10 200 MG capsule Take 200 mg by mouth 2 (two) times daily.     . colesevelam (WELCHOL) 625 MG tablet Take 1,250 mg by mouth 3 (three) times daily.     . D3-50 50000 units capsule take 1 capsule by mouth ONCE A MONTH  0  . diclofenac sodium (VOLTAREN) 1 % GEL Apply 4 g topically as needed. 5 Tube 0  . folic acid (FOLVITE) 1 MG tablet take 1 tablet by mouth once daily 90 tablet 1  . hydroxychloroquine (PLAQUENIL) 200 MG tablet take 1  tablet by mouth twice a day 180 tablet 0  . methotrexate (RHEUMATREX) 2.5 MG tablet take 6 tablets by mouth every week 72 tablet 0  . omega-3 acid ethyl esters (LOVAZA) 1 g capsule take 1 capsule by mouth twice a day (Patient taking differently: take 1 capsule by mouth once a day) 60 capsule 11  . predniSONE (DELTASONE) 5 MG tablet Take 4 tab for 4 days, then 3 tab for 4 days, then 2 tab for 4 days, then 1 tab for 4 days then 1/2 tab for 4 days if needed for flare 42 tablet 0  . Vitamin D, Ergocalciferol, (DRISDOL) 50000 units CAPS capsule Take 1 capsule (50,000 Units total) by mouth every 30 (thirty) days. 3 capsule 4  . vitamin E 100 UNIT capsule Take 100 Units by mouth daily.     No current facility-administered medications for this visit.     Allergies as of 11/01/2016 - Review Complete 11/01/2016  Allergen Reaction Noted  . Sulfasalazine Anaphylaxis 02/08/2016  . Demerol [meperidine] Nausea Only 03/01/2012    Vitals: BP 130/72   Pulse 83   Ht 6\' 2"  (1.88 m)   Wt 184 lb 8 oz (83.7 kg)   BMI 23.69 kg/m  Last Weight:  Wt Readings from Last 1 Encounters:  11/01/16 184 lb 8 oz (83.7 kg)   Last Height:   Ht Readings from Last 1 Encounters:  11/01/16 6\' 2"  (1.88 m)    Physical exam:  General: The patient is awake, alert and appears not in acute distress. The patient is well groomed. Head: Normocephalic, atraumatic. Neck is supple. Mallampati 2 , neck circumference: 14.5 inches.   Cardiovascular:  Regular rate and rhythm, without  murmurs or carotid bruit, and without distended neck veins. Respiratory: Lungs are clear to auscultation. Skin:  Without evidence of edema, or rash Trunk: BMI is normal.  Neurologic exam : The patient is awake and alert, oriented to place and time.   Memory subjective described as intact. There is a normal attention span & concentration ability.  Speech is fluent without dysarthria, dysphonia or aphasia.  Mood and affect are appropriate.  Cranial  nerves: Pupils are equal and briskly reactive to light. Funduscopic exam without  evidence of pallor or edema.  Extraocular movements  in vertical and horizontal planes intact and without nystagmus.  Visual fields by finger perimetry are intact.Hearing to finger rub intact. Facial sensation intact to fine touch.  Facial motor strength is symmetric and tongue and uvula move midline.  Motor exam:  Normal tone and normal muscle bulk and symmetric normal strength in all extremities. Sensory:  Fine touch, pinprick and vibration were normal. i did not test the planta pedis.  Coordination: Rapid alternating movements in the fingers/hands is tested and normal.  Finger-to-nose maneuver tested and normal without evidence of ataxia, dysmetria or tremor.  Gait and station: Patient walks without assistive device. Strength within normal limits. Stance is stable and normal. Deep tendon reflexes: in the  upper and lower extremities are attenuated , symmetric and intact.   Assessment:  After physical and neurologic examination, review of laboratory studies, imaging, neurophysiology testing and pre-existing records, assessment is   1) newly developed area of tingling dysesthesia behind the left ear in a distribution spanning about 5 x 9 cm skin area. There is no discoloration no dryness no target lesion noticed. I will check for Lyme and sarcoid.  2) Plaquenil for stiffness, followed by rheumatology, "ill defined polymyalgia, connective tissue disease" quote from patient- he  takes   Coenzyme Q 10 .  basal cell carcinoma, removed from the right preauricular area, 9th of November    Plan:  Treatment plan and additional workup : No changes in CPAP therapy.  Blood work every 6 month with Dr. Estanislado Pandy.    Larey Seat, MD   CC Dyke Brackett, MD and Shon Baton, MD

## 2016-11-03 LAB — LYME AB/WESTERN BLOT REFLEX: LYME DISEASE AB, QUANT, IGM: <0.8 index (ref 0.00–0.79)

## 2016-11-07 ENCOUNTER — Telehealth: Payer: Self-pay | Admitting: Neurology

## 2016-11-07 NOTE — Telephone Encounter (Signed)
-----   Message from Larey Seat, MD sent at 11/04/2016  5:57 PM EDT ----- Lyme test negative. Copy to PCP Shon Baton  CD

## 2016-11-07 NOTE — Telephone Encounter (Signed)
Informed pt of the negative test result. Pt had no further questions and verbalized understanding. Pt agreed to sending results to his PCP.

## 2016-11-23 ENCOUNTER — Other Ambulatory Visit: Payer: Self-pay | Admitting: Rheumatology

## 2016-11-24 ENCOUNTER — Ambulatory Visit
Admission: RE | Admit: 2016-11-24 | Discharge: 2016-11-24 | Disposition: A | Discharge status: Home / Self Care | Payer: Medicare Other | Source: Ambulatory Visit | Attending: Neurology | Admitting: Neurology

## 2016-11-24 DIAGNOSIS — R209 Unspecified disturbances of skin sensation: Secondary | ICD-10-CM

## 2016-11-24 DIAGNOSIS — M5030 Other cervical disc degeneration, unspecified cervical region: Secondary | ICD-10-CM

## 2016-11-24 MED ORDER — GADOBENATE DIMEGLUMINE 529 MG/ML IV SOLN
17.0000 mL | Freq: Once | INTRAVENOUS | Status: AC | PRN
  Administered 2016-11-24: 17 mL via INTRAVENOUS

## 2016-11-25 NOTE — Telephone Encounter (Signed)
Ok

## 2016-11-25 NOTE — Telephone Encounter (Signed)
He takes this for maintenance once a month will you approve ?  Last labs in March show Vitamin d 38 he has appt in October, ok to check level then, or do you need now?

## 2016-11-28 ENCOUNTER — Other Ambulatory Visit: Payer: Self-pay | Admitting: Neurology

## 2016-11-28 ENCOUNTER — Telehealth: Payer: Self-pay | Admitting: Neurology

## 2016-11-28 DIAGNOSIS — M4802 Spinal stenosis, cervical region: Secondary | ICD-10-CM

## 2016-11-28 DIAGNOSIS — M9981 Other biomechanical lesions of cervical region: Principal | ICD-10-CM

## 2016-11-28 NOTE — Telephone Encounter (Signed)
Called and spoke with MR O'tuel and discussed his MRI results. Pt had already read them on my chart and was just curious who dr Brett Fairy would recommend. I mentioned a few and [name] chose Dr Rita Ohara. The patient had lots of questions as to how soon he could be seen and I explained that can take some time for the referral to be placed and get to their department and then especially get worked in with that Physician on their schedule. Pt verbalized understanding and just stated he was anxious to get in and have it evaluated. I have placed the order for the referral and I have made Hinton Dyer in our referral's department aware of patients concern. Pt had no further questions at this time.

## 2016-11-28 NOTE — Telephone Encounter (Signed)
-----   Message from Larey Seat, MD sent at 11/28/2016  8:04 AM EDT ----- Multi-level severe foraminal stenosis. I recommend a neurosurgical consult. Cc Dr Virgina Jock

## 2016-11-28 NOTE — Addendum Note (Signed)
Addended by: Larey Seat on: 11/28/2016 12:35 PM   Modules accepted: Orders

## 2016-11-28 NOTE — Telephone Encounter (Signed)
Pt is asking for a call from Dr Brett Fairy re: MRI results

## 2016-11-29 NOTE — Progress Notes (Signed)
Referral was sent 11/28/2016. I relayed on referral form Patient wanted to be scheduled asap. Thanks Hinton Dyer.

## 2016-12-05 HISTORY — PX: PACEMAKER IMPLANT: EP1218

## 2016-12-05 HISTORY — PX: PACEMAKER INSERTION: SHX728

## 2016-12-06 ENCOUNTER — Telehealth: Payer: Self-pay | Admitting: Cardiology

## 2016-12-06 NOTE — Telephone Encounter (Signed)
Please make sure the notes are referred back to me so that I remember to look at them.

## 2016-12-06 NOTE — Telephone Encounter (Signed)
Spoke to Dr. Virgina Jock. He had called as an FYI to let us know he saw patient following up after an admission to [facility].  Apparently the patient had been admitted there after having 2-3 syncopal events in an hour and reporting to the ED. Was found to be in complete heart block. Had temporary pacer followed by what Dr. Virgina Jock assumed was a permanent pacemaker implantation, however, he doesn't think he received complete records from facility. He's faxing what he has to [place] for our review.  Dr. Virgina Jock wanted to make Dr. Percival Spanish aware in case patient needs EP referral.  Will route as FYI and get records scanned into system once received.

## 2016-12-15 NOTE — Telephone Encounter (Signed)
Received records from Dr Virgina Jock office will give to Dr Percival Spanish for reviewed

## 2016-12-27 ENCOUNTER — Other Ambulatory Visit: Payer: Self-pay | Admitting: Rheumatology

## 2016-12-27 MED ORDER — METHOTREXATE 2.5 MG PO TABS: 15.0000 mg | ORAL_TABLET | ORAL | 0 refills | Status: DC

## 2016-12-27 NOTE — Telephone Encounter (Signed)
Patient left a message stating he is completely out of [place]. Please call into [facility] in [place].

## 2016-12-27 NOTE — Telephone Encounter (Signed)
Left message for patient to call the office to verify the pharmacy.   Last Visit: 07/26/16 Next Visit: 01/24/17 Labs: 10/08/16 Elevated Glucose  Okay to refill per Dr. Estanislado Pandy

## 2016-12-27 NOTE — Telephone Encounter (Signed)
Patient verified pharmacy and prescription sent to the pharmacy.

## 2016-12-28 ENCOUNTER — Telehealth: Payer: Self-pay | Admitting: Cardiology

## 2016-12-28 NOTE — Telephone Encounter (Signed)
Schedule follow up with me.  Tomorrow if he wants to be seen that soon.

## 2016-12-28 NOTE — Telephone Encounter (Signed)
S/w pt he states that he is at his summer home in [place] he states that he had an "episode of bradycardia" on 12-04-16 he states that he went to urgent care in [place] and the cardiologist happened to be there Dr Vista Deck [phone] tel) after seeing him in the UC he put on a temporary pacemake and then the next day he put in a permanent pace maker(Dr Hordes said will fax records) he had pacemaker placed at [facility] @[place]  (he will tell them today to have them fax records)He states that he would like a call if we do not receive these records. he states that he has been "fine" since the pacemaker has been placed. Message sent to medical records.

## 2016-12-28 NOTE — Telephone Encounter (Signed)
New message      Pt want to talk to Dr Percival Spanish .  He want to discuss a "medical" event he had while in [place].  He did not want to go into details with me.  Please call

## 2016-12-28 NOTE — Telephone Encounter (Signed)
Pt is still out of town, he won't be back until his appt date on October 9th, pt will like for you to give a call when you have time.

## 2017-01-06 ENCOUNTER — Encounter: Payer: Self-pay | Admitting: Cardiology

## 2017-01-10 ENCOUNTER — Telehealth: Payer: Self-pay | Admitting: Cardiology

## 2017-01-10 NOTE — Telephone Encounter (Signed)
Follow Up ° ° ° °Returning call from earlier. Please call. °

## 2017-01-10 NOTE — Telephone Encounter (Signed)
Received records from [facility] for appointment on 01/17/17 with Dr Percival Spanish.  Records put with Dr Hochrein's schedule for 01/17/17. lp

## 2017-01-10 NOTE — Telephone Encounter (Signed)
Spoke with pt letting him know we have received records.

## 2017-01-10 NOTE — Telephone Encounter (Signed)
New message    Pt is calling asking for a call back. He wants to know if we have received records from [facility]?

## 2017-01-13 ENCOUNTER — Other Ambulatory Visit: Payer: Self-pay | Admitting: Rheumatology

## 2017-01-13 NOTE — Progress Notes (Deleted)
Office Visit Note  Patient: Jeffery [name].             Date of Birth: July 05, 1934           MRN: [id number]             PCP: Shon Baton, MD Referring: Shon Baton, MD Visit Date: 01/24/2017 Occupation: @GUAROCC @    Subjective:  No chief complaint on file.   History of Present Illness: Jeffery [name]. is a 82 y.o. male ***   Activities of Daily Living:  Patient reports morning stiffness for *** {minute/hour:19697}.   Patient {ACTIONS;DENIES/REPORTS:21021675::"Denies"} nocturnal pain.  Difficulty dressing/grooming: {ACTIONS;DENIES/REPORTS:21021675::"Denies"} Difficulty climbing stairs: {ACTIONS;DENIES/REPORTS:21021675::"Denies"} Difficulty getting out of chair: {ACTIONS;DENIES/REPORTS:21021675::"Denies"} Difficulty using hands for taps, buttons, cutlery, and/or writing: {ACTIONS;DENIES/REPORTS:21021675::"Denies"}   No Rheumatology ROS completed.   PMFS History:  Patient Active Problem List   Diagnosis Date Noted  . Vitamin D deficiency 02/08/2016  . Trigger finger, left middle finger 02/08/2016  . High risk medication use 02/08/2016  . Bursitis of right shoulder 02/08/2016  . DDD (degenerative disc disease), lumbar 02/08/2016  . DDD (degenerative disc disease), thoracic 02/08/2016  . Rheumatoid arthritis of multiple sites without rheumatoid factor ([place]) 11/28/2015    Class: Chronic  . Other secondary osteoarthritis of both hands 11/28/2015    Class: Chronic  . PMR (polymyalgia rheumatica) (HCC) 11/28/2015    Class: Chronic  . Osteoporosis 11/28/2015    Class: Chronic  . Prostate cancer ([place]) 11/28/2015    Class: History of  . Duodenal ulcer 11/28/2015    Class: Chronic  . High cholesterol 11/28/2015  . Dizziness 07/16/2015  . Incomplete RBBB 03/31/2015  . Sleep apnea, primary central 03/03/2015  . Polyneuropathy ([place]) 01/24/2013  . Right inguinal hernia 07/05/2012    Past Medical History:  Diagnosis Date  . Arthritis   . BCC (basal cell carcinoma)    . BPH (benign prostatic hypertrophy) 2008  . Cancer [facility]) 2008   Prostate  . Complex sleep apnea syndrome   . Diverticulosis   . ED (erectile dysfunction)   . GERD (gastroesophageal reflux disease)   . Hemorrhoids   . Hernia, inguinal   . Hyperlipidemia   . IFG (impaired fasting glucose)   . Osteopenia   . Peptic ulcer disease with hemorrhage 1993  . RBBB (right bundle branch block)   . Shingles   . Vitamin D deficiency     Family History  Problem Relation Age of Onset  . Heart failure Mother        Died age 63.  Apparently had valve surgery.   . Congenital heart disease Brother    Past Surgical History:  Procedure Laterality Date  . BACK SURGERY N/A 1984  . CATARACT EXTRACTION, BILATERAL    . HERNIA REPAIR    . LAMINECTOMY  1984   L 5  . PROSTATE SURGERY  12/2006   Robotic  . TONSILLECTOMY  82 years old  . TRIGGER FINGER RELEASE  03/01/2012   Procedure: MINOR RELEASE TRIGGER FINGER/A-1 PULLEY;  Surgeon: Cammie Sickle., MD;  Location: [place];  Service: Orthopedics;  Laterality: Left;  long finger   Social History   Social History Narrative   Right handed, Retired .Married, 2 kids. Caffeine none.   3 Step      Objective: Vital Signs: There were no vitals taken for this visit.   Physical Exam   Musculoskeletal Exam: ***  CDAI Exam: No CDAI exam completed.  Investigation: No additional findings.   Imaging: No results found.  Speciality Comments: No specialty comments available.    Procedures:  No procedures performed Allergies: Sulfasalazine and Demerol [meperidine]   Assessment / Plan:     Visit Diagnoses: Rheumatoid arthritis of multiple sites without rheumatoid factor (HCC)  PMR (polymyalgia rheumatica) (HCC)  High risk medication use - Plaquenil and Methotrexate   Age-related osteoporosis without current pathological fracture - *on Fosamax DEXA 03/18/2015 Mild osteopenia. No treatment needed except ca, vit D and  exercise. Repeat in 3 years.  Other secondary osteoarthritis of both hands  Trigger finger, left middle finger  Bursitis of right shoulder  DDD (degenerative disc disease), thoracic  DDD (degenerative disc disease), lumbar  Vitamin D deficiency  History of prostate cancer  History of bundle branch block  History of neuropathy  History of peptic ulcer disease  History of sleep apnea    Orders: No orders of the defined types were placed in this encounter.  No orders of the defined types were placed in this encounter.   Face-to-face time spent with patient was *** minutes. 50% of time was spent in counseling and coordination of care.  Follow-Up Instructions: No Follow-up on file.   Jeffery [name], RT  Note - This record has been created using [employer].  Chart creation errors have been sought, but may not always  have been located. Such creation errors do not reflect on  the standard of medical care.

## 2017-01-13 NOTE — Telephone Encounter (Signed)
Last Visit: 07/26/16 Next Visit: 01/24/17 Labs: 10/08/16 Elevated Glucose  Okay to refill per Dr. Estanislado Pandy

## 2017-01-17 ENCOUNTER — Encounter: Payer: Self-pay | Admitting: Cardiology

## 2017-01-17 ENCOUNTER — Telehealth: Payer: Self-pay | Admitting: Cardiology

## 2017-01-17 ENCOUNTER — Ambulatory Visit (INDEPENDENT_AMBULATORY_CARE_PROVIDER_SITE_OTHER): Payer: Medicare Other | Admitting: Cardiology

## 2017-01-17 VITALS — BP 126/62 | HR 80 | Ht 74.0 in

## 2017-01-17 DIAGNOSIS — I10 Essential (primary) hypertension: Secondary | ICD-10-CM | POA: Diagnosis not present

## 2017-01-17 DIAGNOSIS — I442 Atrioventricular block, complete: Secondary | ICD-10-CM

## 2017-01-17 NOTE — Telephone Encounter (Signed)
Returned call to pt and he emphatically states that he NEEDS to make sure before coming to appt weather the records that we received from [facility] are the records include an appointment dated 01-10-17. He states that he will need to call and "get this cleared up" before the appointment. Please call [phone] his cell phone

## 2017-01-17 NOTE — Telephone Encounter (Signed)
New Message ° ° pt verbalized that he is returning call for rn  °

## 2017-01-17 NOTE — Progress Notes (Signed)
Cardiology Office Note   Date:  01/18/2017   ID:  Jeffery [name]., DOB 05/02/1934, MRN [id number]  PCP:  Shon Baton, MD  Cardiologist:   Minus Breeding, MD   Chief Complaint  Patient presents with  . Loss of Consciousness       History of Present Illness: Jeffery [name]. is a 82 y.o. male who presents for evaluation status post pacemaker placement. I saw the patient in April of last year after a syncopal episode in Dec 2016.  There was no clear etiology.  He returned for follow up in June after an ER visit for presyncope.  There was mention of bradycardia but this was thought possibly to be related to dehydration.  In August he was at his home in [place] and developed restless at night in bed.  His wife noted that he was diaphoretic and EMS was called.  He was in third degree heart block and he was hypotensive.  He had a temp pacer placed and the next day had Medtronic 4076 dual chamber pacer placed.  I reviewed these records for this visit.    Of note he did come back to the hospital a couple of days with some left upper arm discomfort. He was ultimately found to have probable reflex sympathetic dystrophy. There was a question of thrombus. It was decided against anticoagulation.  He has had follow up of his device but I don't have the most recent office device check.  He does report some program changes.  He said that initially he felt pretty well after getting the pacemaker but at the last change he says he doesn't feel quite as well. He's a little more short of breath. He somewhat vague about the symptoms and is not describing presyncope or syncope. He's had no further episodes of back. He's had no chest pressure, neck or arm discomfort. He has started insulin since being on steroid taper that this might be discontinued and is actively being followed by Shon Baton, MD.  His arm discomfort seems to have resolved.      Past Medical History:  Diagnosis Date  . Arthritis   .  BCC (basal cell carcinoma)   . BPH (benign prostatic hypertrophy) 2008  . Cancer [facility]) 2008   Prostate  . Complex sleep apnea syndrome   . Diverticulosis   . ED (erectile dysfunction)   . GERD (gastroesophageal reflux disease)   . Hemorrhoids   . Hernia, inguinal   . Hyperlipidemia   . IFG (impaired fasting glucose)   . Osteopenia   . Peptic ulcer disease with hemorrhage 1993  . RBBB (right bundle branch block)   . Shingles   . Vitamin D deficiency     Past Surgical History:  Procedure Laterality Date  . BACK SURGERY N/A 1984  . CATARACT EXTRACTION, BILATERAL    . HERNIA REPAIR    . LAMINECTOMY  1984   L 5  . PROSTATE SURGERY  12/2006   Robotic  . TONSILLECTOMY  82 years old  . TRIGGER FINGER RELEASE  03/01/2012   Procedure: MINOR RELEASE TRIGGER FINGER/A-1 PULLEY;  Surgeon: Cammie Sickle., MD;  Location: [place];  Service: Orthopedics;  Laterality: Left;  long finger     Current Outpatient Prescriptions  Medication Sig Dispense Refill  . acetaminophen (TYLENOL) 500 MG tablet Take 500 mg by mouth every 6 (six) hours as needed.    Marland Kitchen alendronate (FOSAMAX) 70 MG tablet take 1  tablet by mouth every week 12 tablet 0  . Calcium Carbonate (CALCIUM 600 PO) Take 600 mg by mouth daily.    . Coenzyme Q10 200 MG capsule Take 200 mg by mouth 2 (two) times daily.     . colesevelam (WELCHOL) 625 MG tablet Take 1,250 mg by mouth 3 (three) times daily.     . D3-50 50000 units capsule take 1 capsule by mouth ONCE A MONTH 12 capsule 0  . diclofenac sodium (VOLTAREN) 1 % GEL Apply 4 g topically as needed. 5 Tube 0  . folic acid (FOLVITE) 1 MG tablet take 1 tablet by mouth once daily 90 tablet 1  . hydroxychloroquine (PLAQUENIL) 200 MG tablet take 1 tablet by mouth twice a day 180 tablet 0  . insulin glargine (LANTUS) 100 UNIT/ML injection Inject 5 Units into the skin daily.    . methotrexate (RHEUMATREX) 2.5 MG tablet Take 6 tablets (15 mg total) by mouth once a week.  Caution:Chemotherapy. Protect from light. 72 tablet 0  . omega-3 acid ethyl esters (LOVAZA) 1 g capsule take 1 capsule by mouth twice a day (Patient taking differently: take 1 capsule by mouth three times each week.) 60 capsule 11  . Vitamin D, Ergocalciferol, 2000 units CAPS Take 2,000 Units by mouth daily.    . vitamin E 100 UNIT capsule Take 100 Units by mouth daily.     No current facility-administered medications for this visit.     Allergies:   Sulfasalazine and Demerol [meperidine]     ROS:  Please see the history of present illness.   Otherwise, review of systems are positive for none.   All other systems are reviewed and negative.    PHYSICAL EXAM: VS:  BP 126/62   Pulse 80   Ht 6\' 2"  (1.88 m)  , BMI There is no height or weight on file to calculate BMI. GENERAL:  Well appearing NECK:  No jugular venous distention, waveform within normal limits, carotid upstroke brisk and symmetric, no bruits, no thyromegaly LYMPHATICS:  No cervical, inguinal adenopathy LUNGS:  Clear to auscultation bilaterally CHEST:  Well healed pacer pocket. HEART:  PMI not displaced or sustained,S1 and S2 within normal limits, no S3, no S4, no clicks, no rubs, no murmurs ABD:  Flat, positive bowel sounds normal in frequency in pitch, no bruits, no rebound, no guarding, no midline pulsatile mass, no hepatomegaly, no splenomegaly EXT:  2 plus pulses throughout, no edema, no cyanosis no clubbing   EKG:  EKG is not ordered today.   Recent Labs: 10/08/2016: ALT 15; BUN 13; Creat 0.76; Hemoglobin 15.6; Platelets 258; Potassium 4.5; Sodium 138    Lipid Panel No results found for: CHOL, TRIG, HDL, CHOLHDL, VLDL, LDLCALC, LDLDIRECT    Wt Readings from Last 3 Encounters:  11/01/16 184 lb 8 oz (83.7 kg)  10/07/16 181 lb 3.2 oz (82.2 kg)  10/06/16 182 lb (82.6 kg)      Other studies Reviewed: Additional studies/ records that were reviewed today include: Office records, ED records.  Hospital records  from [place] Review of the above records demonstrates:  Please see elsewhere in the note.     ASSESSMENT AND PLAN:  HB WITH PACEMAKER IMPLANTATION:  He's done relatively well status post pacemaker implantation only doesn't feel exactly back to himself. He wants to establish care in our pacemaker clinic and I have contacted Dr. Rayann Heman who has kindly agreed to assume his care.  I will get the most recent office visit from his  cardiologist in [place].    SOB:  At this point I don't think that further cardiovascular testing is indicated.  He had negative cardiac enzymes with his recent hospitalization.  He no ischemia on recent POET (Plain Old Exercise Treadmill)  BP:   BP is well controlled.  He will continue the meds as listed.     Current medicines are reviewed at length with the patient today.  The patient does not have concerns regarding medicines.  The following changes have been made:  None  Labs/ tests ordered today include:  None  No orders of the defined types were placed in this encounter.    Disposition:   Follow up with Dr. Rayann Heman.     Signed, Minus Breeding, MD  01/18/2017 9:12 AM    [facility]

## 2017-01-18 ENCOUNTER — Encounter: Payer: Self-pay | Admitting: Cardiology

## 2017-01-18 DIAGNOSIS — I442 Atrioventricular block, complete: Secondary | ICD-10-CM | POA: Insufficient documentation

## 2017-01-19 ENCOUNTER — Other Ambulatory Visit: Payer: Self-pay

## 2017-01-19 DIAGNOSIS — Z79899 Other long term (current) drug therapy: Secondary | ICD-10-CM

## 2017-01-19 LAB — COMPLETE METABOLIC PANEL WITH GFR
AG Ratio: 1.4 (calc) (ref 1.0–2.5)
ALT: 18 U/L (ref 9–46)
AST: 8 U/L — AB (ref 10–35)
Albumin: 3.9 g/dL (ref 3.6–5.1)
Alkaline phosphatase (APISO): 66 U/L (ref 40–115)
BUN / CREAT RATIO: 16 (calc) (ref 6–22)
BUN: 11 mg/dL (ref 7–25)
CALCIUM: 9.6 mg/dL (ref 8.6–10.3)
CHLORIDE: 99 mmol/L (ref 98–110)
CO2: 30 mmol/L (ref 20–32)
Creat: 0.68 mg/dL — ABNORMAL LOW (ref 0.70–1.11)
GFR, EST AFRICAN AMERICAN: 105 mL/min/{1.73_m2} (ref >=60)
GFR, Est Non African American: 90 mL/min/{1.73_m2} (ref >=60)
GLOBULIN: 2.8 g/dL (ref 1.9–3.7)
GLUCOSE: 115 mg/dL — AB (ref 65–99)
POTASSIUM: 5.3 mmol/L (ref 3.5–5.3)
SODIUM: 138 mmol/L (ref 135–146)
Total Bilirubin: 0.8 mg/dL (ref 0.2–1.2)
Total Protein: 6.7 g/dL (ref 6.1–8.1)

## 2017-01-19 LAB — CBC WITH DIFFERENTIAL/PLATELET
Basophils Absolute: 10 cells/uL (ref 0–200)
Basophils Relative: 0.2 %
EOS PCT: 4.5 %
Eosinophils Absolute: 221 cells/uL (ref 15–500)
HEMATOCRIT: 42 % (ref 38.5–50.0)
Hemoglobin: 14.2 g/dL (ref 13.2–17.1)
LYMPHS ABS: 1029 {cells}/uL (ref 850–3900)
MCH: 30.4 pg (ref 27.0–33.0)
MCHC: 33.8 g/dL (ref 32.0–36.0)
MCV: 89.9 fL (ref 80.0–100.0)
MONOS PCT: 14.4 %
MPV: 8.6 fL (ref 7.5–12.5)
NEUTROS PCT: 59.9 %
Neutro Abs: 2935 cells/uL (ref 1500–7800)
PLATELETS: 338 10*3/uL (ref 140–400)
RBC: 4.67 10*6/uL (ref 4.20–5.80)
RDW: 12.2 % (ref 11.0–15.0)
Total Lymphocyte: 21 %
WBC mixed population: 706 cells/uL (ref 200–950)
WBC: 4.9 10*3/uL (ref 3.8–10.8)

## 2017-01-19 NOTE — Telephone Encounter (Signed)
Call and spoke with pt he stated he call his cardiology office in [place] and they will be faxing office visit to the [facility] because he will be seeing Dr Rayann Heman tomorrow.Jeffery [name]

## 2017-01-20 ENCOUNTER — Ambulatory Visit (INDEPENDENT_AMBULATORY_CARE_PROVIDER_SITE_OTHER): Payer: Medicare Other | Admitting: Internal Medicine

## 2017-01-20 ENCOUNTER — Encounter: Payer: Self-pay | Admitting: Internal Medicine

## 2017-01-20 VITALS — BP 126/72 | HR 103 | Ht 74.0 in | Wt 171.0 lb

## 2017-01-20 DIAGNOSIS — I442 Atrioventricular block, complete: Secondary | ICD-10-CM | POA: Diagnosis not present

## 2017-01-20 DIAGNOSIS — R000 Tachycardia, unspecified: Secondary | ICD-10-CM | POA: Diagnosis not present

## 2017-01-20 NOTE — Progress Notes (Signed)
Office Visit Note  Patient: Jeffery [name].             Date of Birth: Mar 30, 1935           MRN: [id number]             PCP: Shon Baton, MD Referring: Shon Baton, MD Visit Date: 01/24/2017 Occupation: @GUAROCC @    Subjective:  Fatigue   History of Present Illness: Jeffery [name]. is a 82 y.o. male with history of rheumatoid arthritis and polymyalgia rheumatica. According to patient in June 2018 he had a near-syncopal episode. He went to emergency room and was diagnosed with episode of bradycardia and dehydration. He was discharged to follow-up with PCP and cardiology. At that time no monitoring was done per patient. On 12/06/2016 he states while he was at [place], he was found to be clammy by his wife at night. He was taken to the emergency room by EMS where he was diagnosed with complete heart block area a temporary pacemaker followed by a permanent pacemaker was placed. He states he recovered quite well from that. The day following the pacemaker placement developed swelling in his left wrist joint which was very severe. He states after investigations an ultrasound of his left upper extremity clot was ruled out. He was given Solu-Medrol IV followed by prednisone taper. He was also given insulin and metformin as he was prediabetic. Which is been gradually tapered by Dr. Virgina Jock. Now he is back in [place] and is following up with Dr. Virgina Jock and Dr. Rayann Heman. He states none of his joints are painful. He's been experiencing increased fatigue lately. Is concerned about that episode of left wrist joint swelling. He states none of the other joints have been swollen. His arthritis has been very well controlled on methotrexate and Plaquenil so far. He was also seen by Dr. Durward Fortes after his MRI of right shoulder joint and responded very well to cortisone injection in the past. He was seen by Dr. Brett Fairy for C-spine issues and was diagnosed with for luminal stenosis. He states the neck  problems have resolved now.  Activities of Daily Living:  Patient reports morning stiffness for 1  hour.   Patient Denies nocturnal pain.  Difficulty dressing/grooming: Denies Difficulty climbing stairs: Denies Difficulty getting out of chair: Denies Difficulty using hands for taps, buttons, cutlery, and/or writing: Denies   Review of Systems  Constitutional: Positive for fatigue. Negative for night sweats and weakness ( ).  HENT: Negative for mouth sores, mouth dryness and nose dryness.   Eyes: Negative for redness and dryness.  Respiratory: Negative for cough, shortness of breath and difficulty breathing.   Cardiovascular: Negative for chest pain, palpitations, hypertension, irregular heartbeat and swelling in legs/feet.  Gastrointestinal: Negative for blood in stool, constipation and diarrhea.  Endocrine: Negative for increased urination.  Genitourinary: Negative for hematuria and urgency.  Musculoskeletal: Positive for arthralgias, joint pain and morning stiffness. Negative for joint swelling, myalgias, muscle weakness, muscle tenderness and myalgias.  Skin: Negative for color change, rash, hair loss, nodules/bumps, skin tightness, ulcers and sensitivity to sunlight.  Allergic/Immunologic: Negative for susceptible to infections.  Neurological: Negative for dizziness, fainting, light-headedness, numbness, memory loss and night sweats.  Hematological: Negative for swollen glands.  Psychiatric/Behavioral: Positive for sleep disturbance. Negative for depressed mood. The patient is not nervous/anxious.     PMFS History:  Patient Active Problem List   Diagnosis Date Noted  . Heart block AV complete ([place]) 01/18/2017  . Vitamin  D deficiency 02/08/2016  . Trigger finger, left middle finger 02/08/2016  . High risk medication use 02/08/2016  . Bursitis of right shoulder 02/08/2016  . DDD (degenerative disc disease), lumbar 02/08/2016  . DDD (degenerative disc disease), thoracic  02/08/2016  . Rheumatoid arthritis of multiple sites without rheumatoid factor ([place]) 11/28/2015    Class: Chronic  . Other secondary osteoarthritis of both hands 11/28/2015    Class: Chronic  . PMR (polymyalgia rheumatica) (HCC) 11/28/2015    Class: Chronic  . Osteoporosis 11/28/2015    Class: Chronic  . Prostate cancer ([place]) 11/28/2015    Class: History of  . Duodenal ulcer 11/28/2015    Class: Chronic  . High cholesterol 11/28/2015  . Dizziness 07/16/2015  . Incomplete RBBB 03/31/2015  . Sleep apnea, primary central 03/03/2015  . Polyneuropathy ([place]) 01/24/2013  . Right inguinal hernia 07/05/2012    Past Medical History:  Diagnosis Date  . Arthritis   . BCC (basal cell carcinoma)   . BPH (benign prostatic hypertrophy) 2008  . Cancer [facility]) 2008   Prostate  . Complete heart block ([place])   . Complex sleep apnea syndrome   . Diverticulosis   . ED (erectile dysfunction)   . GERD (gastroesophageal reflux disease)   . Hemorrhoids   . Hernia, inguinal   . Hyperlipidemia   . IFG (impaired fasting glucose)   . Osteopenia   . Peptic ulcer disease with hemorrhage 1993  . Shingles   . Vitamin D deficiency     Family History  Problem Relation Age of Onset  . Heart failure Mother        Died age 27.  Apparently had valve surgery.   . Congenital heart disease Brother    Past Surgical History:  Procedure Laterality Date  . BACK SURGERY N/A 1984  . CATARACT EXTRACTION, BILATERAL    . HERNIA REPAIR    . LAMINECTOMY  1984   L 5  . PACEMAKER IMPLANT  12/05/2016   MDT Azure XT DR MRI conditional PPM implanted for CHB by Dr Vista Deck in [place]  . PACEMAKER INSERTION  12/05/2016  . PROSTATE SURGERY  12/2006   Robotic  . TONSILLECTOMY  82 years old  . TRIGGER FINGER RELEASE  03/01/2012   Procedure: MINOR RELEASE TRIGGER FINGER/A-1 PULLEY;  Surgeon: Cammie Sickle., MD;  Location: [facility];  Service: Orthopedics;  Laterality: Left;  long finger   Social History     Social History Narrative   Right handed, Retired .Married, 2 kids. Caffeine none.   3 Step      Objective: Vital Signs: BP 134/71   Pulse 98   Resp 12   Ht 6\' 2"  (1.88 m)   Wt 178 lb (80.7 kg)   BMI 22.85 kg/m    Physical Exam  Constitutional: He is oriented to person, place, and time. He appears well-developed and well-nourished.  HENT:  Head: Normocephalic and atraumatic.  Eyes: Pupils are equal, round, and reactive to light. Conjunctivae and EOM are normal.  Neck: Normal range of motion. Neck supple.  Cardiovascular: Normal rate, regular rhythm and normal heart sounds.   Pacemaker  Pulmonary/Chest: Effort normal and breath sounds normal.  Abdominal: Soft. Bowel sounds are normal.  Neurological: He is alert and oriented to person, place, and time.  Skin: Skin is warm and dry. Capillary refill takes less than 2 seconds.  Psychiatric: He has a normal mood and affect. His behavior is normal.  Nursing note and vitals reviewed.  Musculoskeletal Exam: C-spine some limitation of range of motion without discomfort thoracic and lumbar spine good range of motion. Shoulder joints elbow joints with good range of motion. He had no synovitis of her wrist joint MCPs or PIP joints. He has PIP/DIP thickening. He is incomplete extension of some of his PIP joints. Hip joints, knee joints, ankles, MTPs PIPs with good range of motion with no synovitis.  CDAI Exam: No CDAI exam completed.    Investigation: No additional findings. CBC Latest Ref Rng & Units 01/19/2017 10/08/2016 10/06/2016  WBC 3.8 - 10.8 Thousand/uL 4.9 4.9 8.2  Hemoglobin 13.2 - 17.1 g/dL 14.2 15.6 14.6  Hematocrit 38.5 - 50.0 % 42.0 47.2 44.6  Platelets 140 - 400 Thousand/uL 338 258 222   CMP Latest Ref Rng & Units 01/19/2017 10/08/2016 10/06/2016  Glucose 65 - 99 mg/dL 115(H) 186(H) 123(H)  BUN 7 - 25 mg/dL 11 13 12   Creatinine 0.70 - 1.11 mg/dL 0.68(L) 0.76 0.66  Sodium 135 - 146 mmol/L 138 138 136  Potassium 3.5 -  5.3 mmol/L 5.3 4.5 4.1  Chloride 98 - 110 mmol/L 99 100 104  CO2 20 - 32 mmol/L 30 30 26   Calcium 8.6 - 10.3 mg/dL 9.6 9.3 8.9  Total Protein 6.1 - 8.1 g/dL 6.7 6.8 -  Total Bilirubin 0.2 - 1.2 mg/dL 0.8 0.7 -  Alkaline Phos 40 - 115 U/L - 60 -  AST 10 - 35 U/L 8(L) 8(L) -  ALT 9 - 46 U/L 18 15 -    Imaging: No results found.  Speciality Comments: No specialty comments available.    Procedures:  No procedures performed Allergies: Sulfasalazine and Demerol [meperidine]   Assessment / Plan:     Visit Diagnoses: Rheumatoid arthritis of multiple sites without rheumatoid factor (HCC) - RF negative, CCP negative, 1433 eta negative, ANA negative. Patient has no active synovitis on examination today. His been tolerating medications well.  High risk medication use - Methotrexate 6 tabs po q wk, Folic acid 1 mg po qd and Plaquenil 200 mg po bid. His most recent labs were within normal limits. We will continue to monitor his labs every 3 months. He's been getting eye exams and yearly basis.  PMR (polymyalgia rheumatica) ([place]): Appears to be in remission  Age-related osteoporosis without current pathological fracture - On Fosamax DEXA shows lowest T score -2.0 on 03/18/15 . He will need repeat bone density in December 2018  Other fatigue -he has been experiencing increased fatigue lately I will obtain following labs. Plan: TSH, CK, VITAMIN D 25 Hydroxy (Vit-D Deficiency, Fractures)  Pain in left wrist . He describes episode of severe pain and swelling in his left wrist postoperatively. The swelling resolved after prednisone taper he is been off prednisone for 2 weeks without any relapse. As uric acid could be normal at the time of flare I will check uric acid now area- Plan: Uric acid  Primary osteoarthritis of both hands: He is been trying to do some stretching exercises  Bursitis of right shoulder - MRI 07/2016 showed tendinopathy. He responded well to cortisone injection given by Dr.  Durward Fortes.  DDD cervical: MRI was reviewed which showed foraminal stenosis. His symptoms have resolved now.  DDD (degenerative disc disease), thoracic: Chronic pain  DDD (degenerative disc disease), lumbar: Chronic pain  Heart block AV complete ([place]) - s/p Pacemaker placement. Followed up by Dr. Rayann Heman now.  His other medical problems are listed as follows:  Polyneuropathy ([place])  Vitamin D deficiency  History of prostate cancer  History of diabetes mellitus . He was prediabetic and was started on Insulin and Metformin while he was on prednisone taper  History of sleep apnea    Orders: Orders Placed This Encounter  Procedures  . Uric acid  . TSH  . CK  . VITAMIN D 25 Hydroxy (Vit-D Deficiency, Fractures)   No orders of the defined types were placed in this encounter.   Face-to-face time spent with patient was 30 minutes. Greater than 50% of time was spent in counseling and coordination of care.  Follow-Up Instructions: Return in about 5 months (around 06/24/2017) for RA DDD OP.   Bo Merino, MD  Note - This record has been created using Editor, commissioning.  Chart creation errors have been sought, but may not always  have been located. Such creation errors do not reflect on  the standard of medical care.

## 2017-01-20 NOTE — Progress Notes (Signed)
Jeffery Baton, MD: Primary Cardiologist:  Dr Percival Spanish  Jeffery [name]. is a 82 y.o. male with a h/o complete heart block sp PPM (MDT) by Dr Jeffery [name] in [place] who presents today to establish care in the Electrophysiology device clinic.  He recently developed symptomatic complete heart block while in [place].  He underwent PPM with a MDT Azure PPM.  He has had some difficulty with L arm swelling and perhaps a concern for infection but has otherwise done well.  He has recently noticed that his heart rates have been elevated.  This seems to have corresponded to taking prednisone.   Today, he  denies symptoms of palpitations, chest pain, orthopnea, PND, lower extremity edema, dizziness, presyncope, syncope, or neurologic sequela.  The patientis tolerating medications without difficulties and is otherwise without complaint today.   Past Medical History:  Diagnosis Date  . Arthritis   . BCC (basal cell carcinoma)   . BPH (benign prostatic hypertrophy) 2008  . Cancer [name]) 2008   Prostate  . Complete heart block ([place])   . Complex sleep apnea syndrome   . Diverticulosis   . ED (erectile dysfunction)   . GERD (gastroesophageal reflux disease)   . Hemorrhoids   . Hernia, inguinal   . Hyperlipidemia   . IFG (impaired fasting glucose)   . Osteopenia   . Peptic ulcer disease with hemorrhage 1993  . Shingles   . Vitamin D deficiency    Past Surgical History:  Procedure Laterality Date  . BACK SURGERY N/A 1984  . CATARACT EXTRACTION, BILATERAL    . HERNIA REPAIR    . LAMINECTOMY  1984   L 5  . PROSTATE SURGERY  12/2006   Robotic  . TONSILLECTOMY  82 years old  . TRIGGER FINGER RELEASE  03/01/2012   Procedure: MINOR RELEASE TRIGGER FINGER/A-1 PULLEY;  Surgeon: Jeffery [name]., MD;  Location: [facility];  Service: Orthopedics;  Laterality: Left;  long finger    Social History   Social History  . Marital status: Married    Spouse name: N/A  . Number of children: 2  .  Years of education: N/A   Occupational History  . Not on file.   Social History Main Topics  . Smoking status: Never Smoker  . Smokeless tobacco: Never Used  . Alcohol use Not on file  . Drug use: No  . Sexual activity: Not on file   Other Topics Concern  . Not on file   Social History Narrative   Right handed, Retired .Married, 2 kids. Caffeine none.   3 Step     Family History  Problem Relation Age of Onset  . Heart failure Mother        Died age 8.  Apparently had valve surgery.   . Congenital heart disease Brother     Allergies  Allergen Reactions  . Sulfasalazine Anaphylaxis  . Demerol [Meperidine] Nausea Only    Current Outpatient Prescriptions  Medication Sig Dispense Refill  . acetaminophen (TYLENOL) 500 MG tablet Take 500 mg by mouth every 6 (six) hours as needed.    Marland Kitchen alendronate (FOSAMAX) 70 MG tablet take 1 tablet by mouth every week 12 tablet 0  . Calcium Carbonate (CALCIUM 600 PO) Take 600 mg by mouth daily.    . Coenzyme Q10 200 MG capsule Take 200 mg by mouth 2 (two) times daily.     . colesevelam (WELCHOL) 625 MG tablet Take 1,250 mg by mouth 3 (three)  times daily.     . D3-50 50000 units capsule take 1 capsule by mouth ONCE A MONTH 12 capsule 0  . diclofenac sodium (VOLTAREN) 1 % GEL Apply 4 g topically as needed. 5 Tube 0  . folic acid (FOLVITE) 1 MG tablet take 1 tablet by mouth once daily 90 tablet 1  . hydroxychloroquine (PLAQUENIL) 200 MG tablet take 1 tablet by mouth twice a day 180 tablet 0  . metFORMIN (GLUCOPHAGE) 500 MG tablet Take 500 mg by mouth 2 (two) times daily with a meal.    . methotrexate (RHEUMATREX) 2.5 MG tablet Take 6 tablets (15 mg total) by mouth once a week. Caution:Chemotherapy. Protect from light. 72 tablet 0  . omega-3 acid ethyl esters (LOVAZA) 1 g capsule Take 1 g by mouth 3 (three) times a week.    . vitamin E 100 UNIT capsule Take 100 Units by mouth daily.    . insulin glargine (LANTUS) 100 UNIT/ML injection Inject 5  Units into the skin daily.     No current facility-administered medications for this visit.     ROS- all systems are reviewed and negative except as per HPI  Physical Exam: Vitals:   01/20/17 0904  BP: 126/72  Pulse: (!) 103  SpO2: 95%  Weight: 171 lb (77.6 kg)  Height: 6\' 2"  (1.88 m)    GEN- The patient is well appearing, alert and oriented x 3 today.   Head- normocephalic, atraumatic Eyes-  Sclera clear, conjunctiva pink Ears- hearing intact Oropharynx- clear Neck- supple, no JVP Lymph- no cervical lymphadenopathy Lungs- Clear to ausculation bilaterally, normal work of breathing Chest- pacemaker pocket is well healed Heart- Regular rate and rhythm, no murmurs, rubs or gallops, PMI not laterally displaced GI- soft, NT, ND, + BS Extremities- no clubbing, cyanosis, or edema MS- no significant deformity or atrophy Skin- no rash or lesion Psych- euthymic mood, full affect Neuro- strength and sensation are intact  Pacemaker interrogation- reviewed in detail today,  See PACEART report  Assessment and Plan:   1. Complete heart block Normal pacemaker function See Claudia Desanctis Art report Per his request his upper tracking rate was reduced from 140 to 130 bpm.  No other changes today  2. Sinus tachycardia Possibly due to recent prednisone He will see Dr Virgina Jock next week.  I have therefore ordered no labs today Follow-up with EP NP in 2 weeks If heart rates are still elevated and labs unrevealing, we should consider echo at that time.  Enrolled in [employer] trial study today for remote monitoring of his PPM using his iPhone.  Return to see EP NP in 2 weeks to make sure that he is doing well I will see again in December for his 91 day wound check.  Thompson Grayer MD, [facility] 01/20/2017 9:42 AM

## 2017-01-20 NOTE — Patient Instructions (Signed)
Medication Instructions: Your physician recommends that you continue on your current medications as directed. Please refer to the Current Medication list given to you today.   Labwork: None ordered   Testing/Procedures: None ordered   Follow-Up: Your physician wants you to follow-up in: 2 weeks with Chanetta Marshall, NP and Dr. Rayann Heman in December. .  Remote monitoring is used to monitor your Pacemaker  from home. This monitoring reduces the number of office visits required to check your device to one time per year. It allows [place] to keep an eye on the functioning of your device to ensure it is working properly. You are scheduled for a device check from home on 04/24/2017. You may send your transmission at any time that day. If you have a wireless device, the transmission will be sent automatically. After your physician reviews your transmission, you will receive a postcard with your next transmission date.    Any Other Special Instructions Will Be Listed Below (If Applicable).     If you need a refill on your cardiac medications before your next appointment, please call your pharmacy.

## 2017-01-20 NOTE — Progress Notes (Signed)
Labs are normal.

## 2017-01-23 NOTE — Addendum Note (Signed)
Addended by: Jordan Likes on: 01/23/2017 12:55 PM   Modules accepted: Orders

## 2017-01-24 ENCOUNTER — Encounter: Payer: Self-pay | Admitting: Rheumatology

## 2017-01-24 ENCOUNTER — Ambulatory Visit (INDEPENDENT_AMBULATORY_CARE_PROVIDER_SITE_OTHER): Payer: Medicare Other | Admitting: Rheumatology

## 2017-01-24 ENCOUNTER — Ambulatory Visit: Payer: Medicare Other | Admitting: Rheumatology

## 2017-01-24 VITALS — BP 134/71 | HR 98 | Resp 12 | Ht 74.0 in | Wt 178.0 lb

## 2017-01-24 DIAGNOSIS — Z79899 Other long term (current) drug therapy: Secondary | ICD-10-CM | POA: Diagnosis not present

## 2017-01-24 DIAGNOSIS — G629 Polyneuropathy, unspecified: Secondary | ICD-10-CM | POA: Diagnosis not present

## 2017-01-24 DIAGNOSIS — M5134 Other intervertebral disc degeneration, thoracic region: Secondary | ICD-10-CM | POA: Diagnosis not present

## 2017-01-24 DIAGNOSIS — M5136 Other intervertebral disc degeneration, lumbar region: Secondary | ICD-10-CM | POA: Diagnosis not present

## 2017-01-24 DIAGNOSIS — R5383 Other fatigue: Secondary | ICD-10-CM

## 2017-01-24 DIAGNOSIS — M0609 Rheumatoid arthritis without rheumatoid factor, multiple sites: Secondary | ICD-10-CM

## 2017-01-24 DIAGNOSIS — E559 Vitamin D deficiency, unspecified: Secondary | ICD-10-CM

## 2017-01-24 DIAGNOSIS — M47812 Spondylosis without myelopathy or radiculopathy, cervical region: Secondary | ICD-10-CM

## 2017-01-24 DIAGNOSIS — M353 Polymyalgia rheumatica: Secondary | ICD-10-CM

## 2017-01-24 DIAGNOSIS — M810 Age-related osteoporosis without current pathological fracture: Secondary | ICD-10-CM | POA: Diagnosis not present

## 2017-01-24 DIAGNOSIS — Z8546 Personal history of malignant neoplasm of prostate: Secondary | ICD-10-CM

## 2017-01-24 DIAGNOSIS — M51369 Other intervertebral disc degeneration, lumbar region without mention of lumbar back pain or lower extremity pain: Secondary | ICD-10-CM

## 2017-01-24 DIAGNOSIS — Z8669 Personal history of other diseases of the nervous system and sense organs: Secondary | ICD-10-CM

## 2017-01-24 DIAGNOSIS — M25532 Pain in left wrist: Secondary | ICD-10-CM

## 2017-01-24 DIAGNOSIS — M19041 Primary osteoarthritis, right hand: Secondary | ICD-10-CM

## 2017-01-24 DIAGNOSIS — M7551 Bursitis of right shoulder: Secondary | ICD-10-CM

## 2017-01-24 DIAGNOSIS — Z8639 Personal history of other endocrine, nutritional and metabolic disease: Secondary | ICD-10-CM

## 2017-01-24 DIAGNOSIS — I442 Atrioventricular block, complete: Secondary | ICD-10-CM

## 2017-01-24 DIAGNOSIS — M19042 Primary osteoarthritis, left hand: Secondary | ICD-10-CM

## 2017-01-24 NOTE — Patient Instructions (Signed)
Standing Labs We placed an order today for your standing lab work.    Please come back and get your standing labs in January an every 3 months.  We have open lab Monday through Friday from 8:30-11:30 AM and 1:30-4 PM at the office of Dr. Bo Merino.   The office is located at [address], [place], [place], [place] [address] No appointment is necessary.   Labs are drawn by [facility].  You may receive a bill from [facility] for your lab work. If you have any questions regarding directions or hours of operation,  please call [phone].

## 2017-01-25 LAB — TSH: TSH: 1.34 m[IU]/L (ref 0.40–4.50)

## 2017-01-25 LAB — CK: Total CK: 80 U/L (ref 44–196)

## 2017-01-25 LAB — VITAMIN D 25 HYDROXY (VIT D DEFICIENCY, FRACTURES): Vit D, 25-Hydroxy: 39 ng/mL (ref 30–100)

## 2017-01-25 LAB — URIC ACID: Uric Acid, Serum: 5.8 mg/dL (ref 4.0–8.0)

## 2017-01-25 NOTE — Progress Notes (Signed)
Left lab results on the voicemail for the patient.

## 2017-01-25 NOTE — Progress Notes (Signed)
All the labs are normal including uric acid

## 2017-01-30 ENCOUNTER — Other Ambulatory Visit: Payer: Self-pay | Admitting: Rheumatology

## 2017-01-30 NOTE — Telephone Encounter (Signed)
Last visit: 01/24/17 Next visit: message sent to front desk to schedule patient Labs: 01/19/17 normal  Eye exam: 08/08/16  Ok to refill per Dr. Estanislado Pandy.

## 2017-01-31 ENCOUNTER — Telehealth: Payer: Self-pay | Admitting: Rheumatology

## 2017-01-31 NOTE — Telephone Encounter (Signed)
Patient advised labs have been faxed to Dr. Virgina Jock and Dr. Rayann Heman.

## 2017-01-31 NOTE — Telephone Encounter (Signed)
Patient would like lab results sent to Dr. Virgina Jock, and Dr. Rayann Heman. Please advise.

## 2017-02-01 NOTE — Progress Notes (Signed)
Electrophysiology Office Note Date: 02/03/2017  ID:  Jeffery Erastus Bartolomei., DOB 08/29/1934, MRN [id number]  PCP: Jeffery Baton, MD Primary Cardiologist: Jeffery [name] Electrophysiologist: Jeffery [name]: Pacemaker follow-up  Jeffery H Nnamdi Dacus. is a 82 y.o. male seen today for Jeffery [name].  He presents today for routine electrophysiology followup.  Since last being seen in our clinic, the patient reports doing relatively well.   Heart rates are somewhat improved, but still elevated. He also continues with a feeling of fatigue that was not present prior to pacemaker implantation.  He denies chest pain, palpitations, dyspnea, PND, orthopnea, nausea, vomiting, dizziness, syncope, edema, weight gain, or early satiety.  Device History: MDT dual chamber PPM implanted 2018 for heart block    Past Medical History:  Diagnosis Date  . Arthritis   . BCC (basal cell carcinoma)   . BPH (benign prostatic hypertrophy) 2008  . Cancer [facility]) 2008   Prostate  . Complete heart block ([place])   . Complex sleep apnea syndrome   . Diverticulosis   . ED (erectile dysfunction)   . GERD (gastroesophageal reflux disease)   . Hemorrhoids   . Hernia, inguinal   . Hyperlipidemia   . IFG (impaired fasting glucose)   . Osteopenia   . Peptic ulcer disease with hemorrhage 1993  . Shingles   . Vitamin D deficiency    Past Surgical History:  Procedure Laterality Date  . BACK SURGERY N/A 1984  . CATARACT EXTRACTION, BILATERAL    . HERNIA REPAIR    . LAMINECTOMY  1984   L 5  . PACEMAKER IMPLANT  12/05/2016   MDT Azure XT Jeffery MRI conditional PPM implanted for CHB by Jeffery [name] in [name]  . PACEMAKER INSERTION  12/05/2016  . PROSTATE SURGERY  12/2006   Robotic  . TONSILLECTOMY  82 years old  . TRIGGER FINGER RELEASE  03/01/2012   Procedure: MINOR RELEASE TRIGGER FINGER/A-1 PULLEY;  Surgeon: Jeffery Sickle., MD;  Location: [place];  Service: Orthopedics;  Laterality: Left;  long finger    Current  Outpatient Prescriptions  Medication Sig Dispense Refill  . acetaminophen (TYLENOL) 500 MG tablet Take 500 mg by mouth every 6 (six) hours as needed.    Marland Kitchen alendronate (FOSAMAX) 70 MG tablet take 1 tablet by mouth every week 12 tablet 0  . Calcium Carbonate (CALCIUM 600 PO) Take 600 mg by mouth daily.    . Coenzyme Q10 200 MG capsule Take 200 mg by mouth 2 (two) times daily.     . colesevelam (WELCHOL) 625 MG tablet Take 1,250 mg by mouth 3 (three) times daily.     . D3-50 50000 units capsule take 1 capsule by mouth ONCE A MONTH 12 capsule 0  . diclofenac sodium (VOLTAREN) 1 % GEL Apply 4 g topically as needed. 5 Tube 0  . folic acid (FOLVITE) 1 MG tablet take 1 tablet by mouth once daily 90 tablet 1  . hydroxychloroquine (PLAQUENIL) 200 MG tablet take 1 tablet by mouth twice a day 180 tablet 0  . metFORMIN (GLUCOPHAGE) 500 MG tablet Take 500 mg by mouth 2 (two) times daily with a meal.    . methotrexate (RHEUMATREX) 2.5 MG tablet Take 2.5 mg by mouth.    . omega-3 acid ethyl esters (LOVAZA) 1 g capsule Take 1 g by mouth 3 (three) times a week.    . vitamin E 100 UNIT capsule Take 100 Units by mouth daily.     No  current facility-administered medications for this visit.     Allergies:   Sulfasalazine and Demerol [meperidine]   Social History: Social History   Social History  . Marital status: Married    Spouse name: N/A  . Number of children: 2  . Years of education: N/A   Occupational History  . Not on file.   Social History Main Topics  . Smoking status: Never Smoker  . Smokeless tobacco: Never Used  . Alcohol use Not on file  . Drug use: No  . Sexual activity: Not on file   Other Topics Concern  . Not on file   Social History Narrative   Right handed, Retired .Married, 2 kids. Caffeine none.   3 Step     Family History: Family History  Problem Relation Age of Onset  . Heart failure Mother        Died age 6.  Apparently had valve surgery.   . Congenital heart  disease Brother      Review of Systems: All other systems reviewed and are otherwise negative except as noted above.   Physical Exam: VS:  BP 120/74   Pulse (!) 102   Ht 6\' 2"  (1.88 m)   Wt 172 lb (78 kg)   SpO2 98%   BMI 22.08 kg/m  , BMI Body mass index is 22.08 kg/m.  GEN- The patient is well appearing, alert and oriented x 3 today.   HEENT: normocephalic, atraumatic; sclera clear, conjunctiva pink; hearing intact; oropharynx clear; neck supple  Lungs- Clear to ausculation bilaterally, normal work of breathing.  No wheezes, rales, rhonchi Heart- Regular rate and rhythm (paced) GI- soft, non-tender, non-distended, bowel sounds present  Extremities- no clubbing, cyanosis, or edema  MS- no significant deformity or atrophy Skin- warm and dry, no rash or lesion; PPM pocket well healed Psych- euthymic mood, full affect Neuro- strength and sensation are intact  PPM Interrogation- reviewed in detail today,  See PACEART report  EKG:  EKG is not ordered today.  Recent Labs: 01/19/2017: ALT 18; BUN 11; Creat 0.68; Hemoglobin 14.2; Platelets 338; Potassium 5.3; Sodium 138 01/24/2017: TSH 1.34   Wt Readings from Last 3 Encounters:  02/03/17 172 lb (78 kg)  01/24/17 178 lb (80.7 kg)  01/20/17 171 lb (77.6 kg)     Other studies Reviewed: Additional studies/ records that were reviewed today include: Jeffery Jackalyn [place] office notes  Assessment and Plan:  1.  Complete heart block  Normal PPM function See Pace Art report No changes today  2.  Sinus tachycardia Somewhat improved but still present since being seen by Jeffery [name]   Current medicines are reviewed at length with the patient today.   The patient does not have concerns regarding his medicines.  The following changes were made today:  none  Labs/ tests ordered today include:   Orders Placed This Encounter  Procedures  . ECHOCARDIOGRAM COMPLETE     Disposition:   Follow up with Jeffery [name] as  scheduled      Signed, Jeffery Marshall, NP 02/03/2017 3:59 PM  Jeffery [name] [phone] (office) [phone] (fax)

## 2017-02-03 ENCOUNTER — Telehealth: Payer: Self-pay | Admitting: Cardiology

## 2017-02-03 ENCOUNTER — Ambulatory Visit (INDEPENDENT_AMBULATORY_CARE_PROVIDER_SITE_OTHER): Payer: Self-pay | Admitting: *Deleted

## 2017-02-03 ENCOUNTER — Ambulatory Visit (INDEPENDENT_AMBULATORY_CARE_PROVIDER_SITE_OTHER): Payer: Medicare Other | Admitting: Nurse Practitioner

## 2017-02-03 ENCOUNTER — Encounter: Payer: Self-pay | Admitting: Nurse Practitioner

## 2017-02-03 ENCOUNTER — Telehealth: Payer: Self-pay | Admitting: Internal Medicine

## 2017-02-03 VITALS — BP 120/74 | HR 102 | Ht 74.0 in | Wt 172.0 lb

## 2017-02-03 DIAGNOSIS — I442 Atrioventricular block, complete: Secondary | ICD-10-CM | POA: Diagnosis not present

## 2017-02-03 DIAGNOSIS — R5383 Other fatigue: Secondary | ICD-10-CM | POA: Diagnosis not present

## 2017-02-03 DIAGNOSIS — R000 Tachycardia, unspecified: Secondary | ICD-10-CM | POA: Diagnosis not present

## 2017-02-03 NOTE — Progress Notes (Signed)
Remote pacemaker transmission.   

## 2017-02-03 NOTE — Telephone Encounter (Signed)
Patient called asking for device clinic phone number. Direct number provided. Patient verbalized understanding.

## 2017-02-03 NOTE — Patient Instructions (Addendum)
Medication Instructions:   Your physician recommends that you continue on your current medications as directed. Please refer to the Current Medication list given to you today.   If you need a refill on your cardiac medications before your next appointment, please call your pharmacy.  Labwork: NONE ORDERED  TODAY    Testing/Procedures: Your physician has requested that you have an echocardiogram. Echocardiography is a painless test that uses sound waves to create images of your heart. It provides your doctor with information about the size and shape of your heart and how well your heart's chambers and valves are working. This procedure takes approximately one hour. There are no restrictions for this procedure.     Follow-Up: APPOINTMENTS  AS SCHEDULED    Any Other Special Instructions Will Be Listed Below (If Applicable).

## 2017-02-03 NOTE — Telephone Encounter (Signed)
LMOVM reminding pt to send remote transmission.   

## 2017-02-03 NOTE — Telephone Encounter (Signed)
New message    Pt is calling asking for a call back.

## 2017-02-06 ENCOUNTER — Other Ambulatory Visit: Payer: Self-pay

## 2017-02-06 ENCOUNTER — Ambulatory Visit (HOSPITAL_COMMUNITY): Payer: Medicare Other | Attending: Cardiology

## 2017-02-06 DIAGNOSIS — Z8249 Family history of ischemic heart disease and other diseases of the circulatory system: Secondary | ICD-10-CM | POA: Insufficient documentation

## 2017-02-06 DIAGNOSIS — I442 Atrioventricular block, complete: Secondary | ICD-10-CM | POA: Insufficient documentation

## 2017-02-06 DIAGNOSIS — E785 Hyperlipidemia, unspecified: Secondary | ICD-10-CM | POA: Diagnosis not present

## 2017-02-06 DIAGNOSIS — R5383 Other fatigue: Secondary | ICD-10-CM

## 2017-02-06 DIAGNOSIS — G4733 Obstructive sleep apnea (adult) (pediatric): Secondary | ICD-10-CM | POA: Diagnosis not present

## 2017-02-06 DIAGNOSIS — Z8546 Personal history of malignant neoplasm of prostate: Secondary | ICD-10-CM | POA: Diagnosis not present

## 2017-02-06 MED ORDER — PERFLUTREN LIPID MICROSPHERE
1.0000 mL | INTRAVENOUS | Status: AC | PRN
  Administered 2017-02-06: 2 mL via INTRAVENOUS

## 2017-02-08 ENCOUNTER — Telehealth: Payer: Self-pay | Admitting: *Deleted

## 2017-02-08 NOTE — Telephone Encounter (Signed)
[name] calling for results of echo. He would like a comparison to the records from [place]. I let him know that it doesn't look like it has been reviewed by his providers yet, but they will advise him if any recommendations are made. He verbalizes understanding.

## 2017-02-10 ENCOUNTER — Telehealth: Payer: Self-pay | Admitting: Cardiology

## 2017-02-10 NOTE — Telephone Encounter (Signed)
Patient calling to speak w/ you about his ECHO. Informed him that it may be Monday before he gets a return call. Pt verbalized understanding.

## 2017-02-13 NOTE — Telephone Encounter (Signed)
Spoke with patient regarding his test results and he verbalized understanding.

## 2017-02-14 LAB — CUP PACEART REMOTE DEVICE CHECK
Brady Statistic AP VP Percent: 0.01 %
Brady Statistic AP VS Percent: 0 %
Brady Statistic AS VP Percent: 99.99 %
Brady Statistic RV Percent Paced: 100 %
Date Time Interrogation Session: 20181026142057
Implantable Lead Location: 753859
Implantable Lead Model: 4076
Lead Channel Impedance Value: 361 Ohm
Lead Channel Impedance Value: 418 Ohm
Lead Channel Impedance Value: 437 Ohm
Lead Channel Pacing Threshold Amplitude: 1.125 V
Lead Channel Sensing Intrinsic Amplitude: 17.75 mV
Lead Channel Sensing Intrinsic Amplitude: 17.75 mV
Lead Channel Setting Pacing Amplitude: 3.5 V
Lead Channel Setting Pacing Pulse Width: 0.4 ms
Lead Channel Setting Sensing Sensitivity: 1.2 mV
MDC IDC LEAD IMPLANT DT: 20180827
MDC IDC LEAD IMPLANT DT: 20180827
MDC IDC LEAD LOCATION: 753860
MDC IDC MSMT BATTERY REMAINING LONGEVITY: 101 mo
MDC IDC MSMT BATTERY VOLTAGE: 3.18 V
MDC IDC MSMT LEADCHNL RA IMPEDANCE VALUE: 323 Ohm
MDC IDC MSMT LEADCHNL RA PACING THRESHOLD AMPLITUDE: 0.5 V
MDC IDC MSMT LEADCHNL RA PACING THRESHOLD PULSEWIDTH: 0.4 ms
MDC IDC MSMT LEADCHNL RA SENSING INTR AMPL: 4.25 mV
MDC IDC MSMT LEADCHNL RA SENSING INTR AMPL: 4.5 mV
MDC IDC MSMT LEADCHNL RV PACING THRESHOLD PULSEWIDTH: 0.4 ms
MDC IDC PG IMPLANT DT: 20180827
MDC IDC SET LEADCHNL RA PACING AMPLITUDE: 3.5 V
MDC IDC STAT BRADY AS VS PERCENT: 0 %
MDC IDC STAT BRADY RA PERCENT PACED: 0.01 %

## 2017-02-17 ENCOUNTER — Ambulatory Visit (INDEPENDENT_AMBULATORY_CARE_PROVIDER_SITE_OTHER): Payer: Self-pay | Admitting: *Deleted

## 2017-02-17 DIAGNOSIS — I442 Atrioventricular block, complete: Secondary | ICD-10-CM

## 2017-02-17 NOTE — Progress Notes (Signed)
Remote pacemaker transmission.   

## 2017-02-21 LAB — CUP PACEART REMOTE DEVICE CHECK
Brady Statistic AP VS Percent: 0 %
Brady Statistic AS VP Percent: 99.69 %
Brady Statistic AS VS Percent: 0 %
Date Time Interrogation Session: 20181107141507
Implantable Lead Implant Date: 20180827
Implantable Lead Location: 753859
Implantable Lead Model: 4076
Implantable Lead Model: 4076
Lead Channel Impedance Value: 323 Ohm
Lead Channel Impedance Value: 399 Ohm
Lead Channel Pacing Threshold Amplitude: 1.25 V
Lead Channel Pacing Threshold Pulse Width: 0.4 ms
Lead Channel Sensing Intrinsic Amplitude: 17.75 mV
Lead Channel Sensing Intrinsic Amplitude: 17.75 mV
Lead Channel Sensing Intrinsic Amplitude: 4.125 mV
Lead Channel Sensing Intrinsic Amplitude: 4.125 mV
Lead Channel Setting Pacing Amplitude: 2.5 V
Lead Channel Setting Pacing Pulse Width: 0.4 ms
MDC IDC LEAD IMPLANT DT: 20180827
MDC IDC LEAD LOCATION: 753860
MDC IDC MSMT BATTERY REMAINING LONGEVITY: 132 mo
MDC IDC MSMT BATTERY VOLTAGE: 3.18 V
MDC IDC MSMT LEADCHNL RA PACING THRESHOLD AMPLITUDE: 0.625 V
MDC IDC MSMT LEADCHNL RA PACING THRESHOLD PULSEWIDTH: 0.4 ms
MDC IDC MSMT LEADCHNL RV IMPEDANCE VALUE: 380 Ohm
MDC IDC MSMT LEADCHNL RV IMPEDANCE VALUE: 456 Ohm
MDC IDC PG IMPLANT DT: 20180827
MDC IDC SET LEADCHNL RA PACING AMPLITUDE: 2 V
MDC IDC SET LEADCHNL RV SENSING SENSITIVITY: 1.2 mV
MDC IDC STAT BRADY AP VP PERCENT: 0.31 %
MDC IDC STAT BRADY RA PERCENT PACED: 0.31 %
MDC IDC STAT BRADY RV PERCENT PACED: 100 %

## 2017-02-23 ENCOUNTER — Encounter: Payer: Self-pay | Admitting: Cardiology

## 2017-02-23 ENCOUNTER — Encounter: Payer: Self-pay | Admitting: Neurology

## 2017-02-23 ENCOUNTER — Ambulatory Visit (INDEPENDENT_AMBULATORY_CARE_PROVIDER_SITE_OTHER): Payer: Medicare Other | Admitting: Neurology

## 2017-02-23 VITALS — BP 136/75 | HR 89 | Ht 74.0 in | Wt 178.0 lb

## 2017-02-23 DIAGNOSIS — R001 Bradycardia, unspecified: Secondary | ICD-10-CM | POA: Diagnosis not present

## 2017-02-23 DIAGNOSIS — Z950 Presence of cardiac pacemaker: Secondary | ICD-10-CM | POA: Diagnosis not present

## 2017-02-23 DIAGNOSIS — G4731 Primary central sleep apnea: Secondary | ICD-10-CM | POA: Diagnosis not present

## 2017-02-23 NOTE — Progress Notes (Signed)
[name]  Provider:  Larey Seat, M D  Referring Provider: Shon Baton, MD Primary Care Physician:  Shon Baton, MD  Chief Complaint  Patient presents with  . Follow-up    HPI:  Jeffery [name] is seen here today on 23 February 2017 and a follow-up visit after recent cervical spine MRI was obtained.  In the meantime he has received a pacemaker, and is followed by Dr. Rayann Heman through [facility] heart health.  Symptoms occurred in [place], [place] where he was diagnosed and his care was transferred here, he received a temporary pacemaker in [place] .  Treatment for severe bradycardia. diaphoretic and with palpitation, bowel incontinence.  In the interim he had on 11-28-2016 an MRI - he called Dr. Sherwood Gambler and has not made a follow up with him. Post pacemaker implantation he had left arm and wrist pain, was treated with prednisone and has not had symptoms in neck and hands since! Marland Kitchen      11-01-2016.  Jeffery [name]. is a 82 y.o. male seen here as a revisit  from Dr. Virgina Jock for a newly noted area of dysesthesia behind the left ear.  There is no numbness- the skin sensation is intermittently disturbed, tingly, like a fine touch. I evaluated the area and it does not quite fit a dermatomal distribution-Area is retroauricular and would be innervated by C2. There is no spasm.   This patient of Dr. Virgina Jock Dr. Estanislado Pandy has followed up since May 2011. Initially he was referred for a polysomnogram based on chronic fatigue complaints and was diagnosed as complex apnea.  The apnea short-billed complements central and obstructive, but in a distribution that was more typical for a with apnea.  They were neuropathy related labs sent out and 2011 as well as also to address chronic fatigue and all returned to normal range. The patient clearly has an accentuated AHI by position and during REM sleep. He has been healthy and maintains an active lifestyle is traveling is a nonsmoker nondrinker and has  nausea history. The patient responded well to Wellbutrin on only 100 mg daily taken to eliminate or reduce central apneas. During his last visit in 2013 his fatigue score was 20 and his Epworth score 14 points and he had reported one nocturia on average per night. He estimated his daily sleep time is 7 hours or more. He sometimes naps in the afternoons. This is still the case today:  his Epworth is 11 points, and fatigue score is 24. I would like to add that the patient is on chronic Plaquenil therapy. This was initiated after he developed severe muscle stiffness and relief this is the symptoms within 2-3 weeks. 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 Plaquenil  may contribute somewhat to the development of the neuropathy. Wellbutrin has had some positive effect on the level of alertness and daytime and may also be due discomfort.  03-03-15 Jeffery [name] reports that he had taken a drug holiday and for about 6 months did not use Plaquenil. As the responds he noted again stiffness in his hands and inflammation. Dr. Estanislado Pandy , his rheumatologist, had placed him on his prednisone taper but then Plaquenil was reinitiated about 3 months ago. I would like to add that the stiffness has never affected his lower extremities or feet and has always been manifested in his hands. He also has 3 trigger fingers. Fatigue is stable. His Epworth score is endorsed  at 9 points and his fatigue severity at 34 points. The geriatric depression score was  endorsed at 2 points.  We are seeing today after Jeffery [name] suffered a sudden fall with a laceration of the left for head and fracture of the left thumb. He was walking briskly but on an uneven surface and initially believed that he tripped and fell. But he had as sudden premonition to slow down,  before he fell which tells me that he most likely fainted. He has no history of  seizures, he was able to break his fall but had no focal deficits [name] is a fall and also did not feel that he had problems was generating words, but his thought process was scrambled or that he had any focal motor deficits. This rules out a TIA.  Once he was on the ground he was immediately conscious and aware of his surroundings- he was not combative and he will actually waited patiently for EMS to arrive. The ED reports were reviewed here with him. A cardiac workup was initiated and his transthoracic echo cardiogram revealed no abnormalities.  He also underwent carotid Doppler studies which showed minor in my opinion trivial calcifications at the bifurcation. These would be in the range between 1 and 40% the nonsurgical indication range. I would like for the patient to take a baby aspirin is possibly enteric coated once a day should he develop bruising I will like him to take it every other day. I also discussed with him that we have never established at baseline for his neuropathy. I would like for him as early as available in the new year to undergo an EMG and nerve conduction study to evaluate the degree of peripheral neuropathy and axonal versus demyelinating quality of the neuropathy. The patient is interested. A recent comprehensive metabolic panel was Dr. Franchot Gallo was interpreted as normal , as well as a CBC diff  which she has every 3 months.  03-01-2016, [name] 'tuel report some tingling in his right foot plantar and dorsal at the midfoot level. This is not painful but is most likely a manifestation of his neuropathy. He has remains with good range of motion, has not gained weight.   Review of Systems: Out of a complete 14 system review, the patient complains of only the following symptoms, and all other reviewed systems are negative. Intermittent fatigue. Fatigue is stable. His Epworth score is endorsed at 9 points and his fatigue severity at 34 points. The geriatric depression score was   endorsed at 2 points. night blindness.   Dysesthesias resolved under steroids, bradycardia on pacemaker.  MRI reviewed.    Social History   Socioeconomic History  . Marital status: Married    Spouse name: Not on file  . Number of children: 2  . Years of education: Not on file  . Highest education level: Not on file  Social Needs  . Financial resource strain: Not on file  . Food insecurity - worry: Not on file  . Food insecurity - inability: Not on file  . Transportation needs - medical: Not on file  . Transportation needs - non-medical: Not on file  Occupational History  . Not on file  Tobacco Use  . Smoking status: Never Smoker  . Smokeless tobacco: Never Used  Substance and Sexual Activity  . Alcohol use: Not on file  . Drug use: No  . Sexual activity: Not on file  Other Topics Concern  . Not on file  Social History  Narrative   Right handed, Retired .Married, 2 kids. Caffeine none.   3 Step     Family History  Problem Relation Age of Onset  . Heart failure Mother        Died age 22.  Apparently had valve surgery.   . Congenital heart disease Brother     Past Medical History:  Diagnosis Date  . Arthritis   . BCC (basal cell carcinoma)   . BPH (benign prostatic hypertrophy) 2008  . Cancer [facility]) 2008   Prostate  . Complete heart block ([place])   . Complex sleep apnea syndrome   . Diverticulosis   . ED (erectile dysfunction)   . GERD (gastroesophageal reflux disease)   . Hemorrhoids   . Hernia, inguinal   . Hyperlipidemia   . IFG (impaired fasting glucose)   . Osteopenia   . Peptic ulcer disease with hemorrhage 1993  . Shingles   . Vitamin D deficiency     Past Surgical History:  Procedure Laterality Date  . BACK SURGERY N/A 1984  . CATARACT EXTRACTION, BILATERAL    . HERNIA REPAIR    . LAMINECTOMY  1984   L 5  . PACEMAKER IMPLANT  12/05/2016   MDT Azure XT DR MRI conditional PPM implanted for CHB by Dr Vista Deck in [place]  . PACEMAKER INSERTION   12/05/2016  . PROSTATE SURGERY  12/2006   Robotic  . TONSILLECTOMY  82 years old  . TRIGGER FINGER RELEASE  03/01/2012   Procedure: MINOR RELEASE TRIGGER FINGER/A-1 PULLEY;  Surgeon: Cammie Sickle., MD;  [facility];  Service: Orthopedics;  Laterality: Left;  long finger    Current Outpatient Medications  Medication Sig Dispense Refill  . acetaminophen (TYLENOL) 500 MG tablet Take 500 mg by mouth every 6 (six) hours as needed.    Marland Kitchen alendronate (FOSAMAX) 70 MG tablet take 1 tablet by mouth every week 12 tablet 0  . Calcium Carbonate (CALCIUM 600 PO) Take 600 mg by mouth daily.    . Coenzyme Q10 200 MG capsule Take 200 mg by mouth 2 (two) times daily.     . colesevelam (WELCHOL) 625 MG tablet Take 1,250 mg by mouth 3 (three) times daily.     . D3-50 50000 units capsule take 1 capsule by mouth ONCE A MONTH 12 capsule 0  . diclofenac sodium (VOLTAREN) 1 % GEL Apply 4 g topically as needed. 5 Tube 0  . folic acid (FOLVITE) 1 MG tablet take 1 tablet by mouth once daily 90 tablet 1  . hydroxychloroquine (PLAQUENIL) 200 MG tablet take 1 tablet by mouth twice a day (Patient taking differently: take 2 tablets by mouth twice a day) 180 tablet 0  . metFORMIN (GLUCOPHAGE) 500 MG tablet Take 500 mg daily by mouth.     . methotrexate (RHEUMATREX) 2.5 MG tablet Take 2.5 mg by mouth.    . omega-3 acid ethyl esters (LOVAZA) 1 g capsule Take 1 g by mouth 3 (three) times a week.    . vitamin E 100 UNIT capsule Take 100 Units by mouth daily.     No current facility-administered medications for this visit.     Allergies as of 02/23/2017 - Review Complete 02/23/2017  Allergen Reaction Noted  . Sulfasalazine Anaphylaxis 02/08/2016  . Demerol [meperidine] Nausea Only 03/01/2012  . Sulfa antibiotics  02/23/2017    Vitals: Ht 6\' 2"  (1.88 m)   Wt 178 lb (80.7 kg)   BMI 22.85 kg/m  Last Weight:  Wt Readings from Last 1 Encounters:  02/23/17 178 lb (80.7 kg)   Last Height:    Ht Readings from Last 1 Encounters:  02/23/17 6\' 2"  (1.88 m)   DOB: 06/23/1934 MRN: [id number]  ORDERING CLINICIAN: Larey Seat, MD  CLINICAL HISTORY: 82 year old male with neck pain and tingling sensation.  EXAM: MRI cervical spine (with and without)  TECHNIQUE: MRI of the cervical spine was obtained utilizing 3 mm sagittal slices from the posterior fossa down to the T3-4 level with T1, T2 and inversion recovery views. In addition 4 mm axial slices from Y1-9 down to T1-2 level were included with T2 and gradient echo views. CONTRAST: 61ml multihance  COMPARISON: none  IMAGING SITE: [facility] [address] (1.5 Tesla MRI)    FINDINGS:   On sagittal views the vertebral bodies have normal height and alignment.  The spinal cord is normal in size and appearance. The posterior fossa, pituitary gland and paraspinal soft tissues are unremarkable.    On axial views: C2-3: no spinal stenosis or foraminal narrowing  C3-4: disc bulging and uncovertebral joint hypertrophy and facet hypertrophy with severe left foraminal stenosis  C4-5: disc bulging and uncovertebral joint hypertrophy and facet hypertrophy with severe biforaminal stenosis  C5-6: no spinal stenosis or foraminal narrowing C6-7: uncovertebral joint hypertrophy and facet hypertrophy with moderate right foraminal stenosis  C7-T1: disc bulging with no spinal stenosis or foraminal narrowing  T1-2: no spinal stenosis or foraminal narrowing   Limited views of the soft tissues of the head and neck are unremarkable.  No abnormal enhancing lesions.   IMPRESSION:  Abnormal MRI cervical spine (with and without) demonstrating: 1. At C3-4, C4-5: disc bulging and uncovertebral joint hypertrophy and facet hypertrophy with severe left foraminal stenosis  2. At C6-7: uncovertebral joint hypertrophy and facet hypertrophy with moderate right foraminal stenosis 3. No intrinsic, compressive or abnormal enhancing spinal  cord lesions.      INTERPRETING PHYSICIAN:  Penni Bombard, MD Certified in Neurology, Neurophysiology and Neuroimaging  Physical exam:  General: The patient is awake, alert and appears not in acute distress. The patient is well groomed. Head: Normocephalic, atraumatic. Neck is supple. Mallampati 2 , neck circumference: 15 inches.   Cardiovascular:  Regular rate and rhythm, without  murmurs -without distended neck veins. Respiratory: Lungs are clear to auscultation. Skin:  Without evidence of edema, or rash Trunk: BMI is normal.  Neurologic exam : The patient is awake and alert, oriented to place and time.   Memory subjective described as intact. There is a normal attention span & concentration ability.  Speech is fluent without dysarthria, dysphonia or aphasia.  Mood and affect are appropriate.  Cranial nerves:Pupils are equal and briskly reactive to light. Funduscopic exam without  evidence of pallor or edema.  Extraocular movements  in vertical and horizontal planes intact and without nystagmus. Visual fields by finger perimetry are intact.Hearing to finger rub intact. Facial sensation intact to fine touch. Facial motor strength is symmetric and tongue and uvula move midline. Motor exam:  Normal tone and normal muscle bulk and symmetric normal strength in all extremities. Sensory:  Fine touch, pinprick and vibration were normal. I did not test the planta pedis. Coordination: Rapid alternating movements in the fingers/hands is  normal.   Finger-to-nose maneuver tested and normal without evidence of ataxia, dysmetria or tremor.  Gait and station: Patient walks without assistive device. Strength within normal limits. Stance is stable and normal. Deep tendon reflexes: in the  upper and lower extremities are attenuated , symmetric and intact.   Assessment:  After physical and neurologic examination, review of laboratory studies, imaging, neurophysiology testing and  pre-existing records, assessment is   1) status post 2 previously unexplained falls, dehydration or bradycardia- none since pacemaker.    Plan:  Treatment plan and additional workup :  No changes in CPAP therapy.   Blood work every 6 month with Dr. Estanislado Pandy.  Rv in 6 month .      Larey Seat, MD   CC Dyke Brackett, MD and Shon Baton, MD

## 2017-03-13 ENCOUNTER — Other Ambulatory Visit: Payer: Self-pay | Admitting: Rheumatology

## 2017-03-13 NOTE — Telephone Encounter (Signed)
Last visit: 01/24/17 Next visit: 07/25/17 Labs: 01/19/17 normal   Okay to refill per Dr. Estanislado Pandy

## 2017-03-29 ENCOUNTER — Encounter: Payer: Self-pay | Admitting: Internal Medicine

## 2017-03-29 ENCOUNTER — Encounter: Payer: Medicare Other | Admitting: Nurse Practitioner

## 2017-03-29 ENCOUNTER — Ambulatory Visit (INDEPENDENT_AMBULATORY_CARE_PROVIDER_SITE_OTHER): Payer: Medicare Other | Admitting: Internal Medicine

## 2017-03-29 VITALS — BP 118/68 | HR 84 | Ht 74.0 in | Wt 171.0 lb

## 2017-03-29 DIAGNOSIS — Z950 Presence of cardiac pacemaker: Secondary | ICD-10-CM

## 2017-03-29 DIAGNOSIS — I442 Atrioventricular block, complete: Secondary | ICD-10-CM | POA: Diagnosis not present

## 2017-03-29 DIAGNOSIS — R000 Tachycardia, unspecified: Secondary | ICD-10-CM | POA: Diagnosis not present

## 2017-03-29 LAB — CUP PACEART INCLINIC DEVICE CHECK
Brady Statistic AS VP Percent: 99.41 %
Brady Statistic RA Percent Paced: 0.58 %
Brady Statistic RV Percent Paced: 100 %
Date Time Interrogation Session: 20181219112350
Implantable Lead Implant Date: 20180827
Implantable Lead Location: 753860
Implantable Lead Model: 4076
Implantable Pulse Generator Implant Date: 20180827
Lead Channel Impedance Value: 456 Ohm
Lead Channel Pacing Threshold Amplitude: 0.75 V
Lead Channel Sensing Intrinsic Amplitude: 4 mV
MDC IDC LEAD IMPLANT DT: 20180827
MDC IDC LEAD LOCATION: 753859
MDC IDC MSMT BATTERY REMAINING LONGEVITY: 131 mo
MDC IDC MSMT BATTERY VOLTAGE: 3.17 V
MDC IDC MSMT LEADCHNL RA IMPEDANCE VALUE: 342 Ohm
MDC IDC MSMT LEADCHNL RA IMPEDANCE VALUE: 418 Ohm
MDC IDC MSMT LEADCHNL RA PACING THRESHOLD PULSEWIDTH: 0.4 ms
MDC IDC MSMT LEADCHNL RV IMPEDANCE VALUE: 380 Ohm
MDC IDC MSMT LEADCHNL RV PACING THRESHOLD AMPLITUDE: 1 V
MDC IDC MSMT LEADCHNL RV PACING THRESHOLD PULSEWIDTH: 0.4 ms
MDC IDC SET LEADCHNL RA PACING AMPLITUDE: 1.5 V
MDC IDC SET LEADCHNL RV PACING AMPLITUDE: 2.5 V
MDC IDC SET LEADCHNL RV PACING PULSEWIDTH: 0.4 ms
MDC IDC SET LEADCHNL RV SENSING SENSITIVITY: 4 mV
MDC IDC STAT BRADY AP VP PERCENT: 0.58 %
MDC IDC STAT BRADY AP VS PERCENT: 0 %
MDC IDC STAT BRADY AS VS PERCENT: 0 %

## 2017-03-29 NOTE — Progress Notes (Signed)
PCP: Shon Baton, MD Primary Cardiologist:  Dr Percival Spanish Primary EP:  Dr Rayann Heman  Jeffery [name]. is a 82 y.o. male who presents today for routine electrophysiology followup.  Since last being seen in our clinic, the patient reports doing very well.  Today, he denies symptoms of palpitations, chest pain, shortness of breath,  lower extremity edema, dizziness, presyncope, or syncope.  The patient is otherwise without complaint today.   Past Medical History:  Diagnosis Date  . Arthritis   . BCC (basal cell carcinoma)   . BPH (benign prostatic hypertrophy) 2008  . Cancer [facility]) 2008   Prostate  . Complete heart block ([place])   . Complex sleep apnea syndrome   . Diverticulosis   . ED (erectile dysfunction)   . GERD (gastroesophageal reflux disease)   . Hemorrhoids   . Hernia, inguinal   . Hyperlipidemia   . IFG (impaired fasting glucose)   . Osteopenia   . Peptic ulcer disease with hemorrhage 1993  . Shingles   . Vitamin D deficiency    Past Surgical History:  Procedure Laterality Date  . BACK SURGERY N/A 1984  . CATARACT EXTRACTION, BILATERAL    . HERNIA REPAIR    . LAMINECTOMY  1984   L 5  . PACEMAKER IMPLANT  12/05/2016   MDT Azure XT DR MRI conditional PPM implanted for CHB by Dr Vista Deck in [place]  . PACEMAKER INSERTION  12/05/2016  . PROSTATE SURGERY  12/2006   Robotic  . TONSILLECTOMY  82 years old  . TRIGGER FINGER RELEASE  03/01/2012   Procedure: MINOR RELEASE TRIGGER FINGER/A-1 PULLEY;  Surgeon: Cammie Sickle., MD;  Location: [place];  Service: Orthopedics;  Laterality: Left;  long finger    ROS- all systems are reviewed and negative except as per HPI above  Current Outpatient Medications  Medication Sig Dispense Refill  . acetaminophen (TYLENOL) 500 MG tablet Take 500 mg by mouth every 6 (six) hours as needed.    Marland Kitchen alendronate (FOSAMAX) 70 MG tablet take 1 tablet by mouth every week 12 tablet 0  . Calcium Carbonate (CALCIUM 600 PO)  Take 600 mg by mouth daily.    . Coenzyme Q10 200 MG capsule Take 200 mg by mouth 2 (two) times daily.     . colesevelam (WELCHOL) 625 MG tablet Take 1,250 mg by mouth 3 (three) times daily.     . D3-50 50000 units capsule take 1 capsule by mouth ONCE A MONTH 12 capsule 0  . diclofenac sodium (VOLTAREN) 1 % GEL Apply 4 g topically as needed. 5 Tube 0  . folic acid (FOLVITE) 1 MG tablet take 1 tablet by mouth once daily 90 tablet 1  . hydroxychloroquine (PLAQUENIL) 200 MG tablet take 1 tablet by mouth twice a day (Patient taking differently: take 2 tablets by mouth twice a day) 180 tablet 0  . metFORMIN (GLUCOPHAGE) 500 MG tablet Take 500 mg daily by mouth.     . methotrexate (RHEUMATREX) 2.5 MG tablet take 6 tablets by mouth every week 72 tablet 0  . omega-3 acid ethyl esters (LOVAZA) 1 g capsule Take 1 g by mouth 3 (three) times a week.    . vitamin E 100 UNIT capsule Take 100 Units by mouth daily.     No current facility-administered medications for this visit.     Physical Exam: Vitals:   03/29/17 1004  BP: 118/68  Pulse: 84  SpO2: 97%  Weight: 171 lb (  77.6 kg)  Height: 6\' 2"  (1.88 m)    GEN- The patient is well appearing, alert and oriented x 3 today.   Head- normocephalic, atraumatic Eyes-  Sclera clear, conjunctiva pink Ears- hearing intact Oropharynx- clear Lungs- Clear to ausculation bilaterally, normal work of breathing Chest- pacemaker pocket is well healed Heart- Regular rate and rhythm, no murmurs, rubs or gallops, PMI not laterally displaced GI- soft, NT, ND, + BS Extremities- no clubbing, cyanosis, or edema  Pacemaker interrogation- reviewed in detail today,  See PACEART report    Assessment and Plan:  1. Symptomatic complete heart block Normal pacemaker function See Pace Art report No changes today  2. Sinus tachycardia resolved No change required today  Carelink Return to see EP NP in a year  Thompson Grayer MD, [facility] 03/29/2017 10:12 AM

## 2017-03-29 NOTE — Patient Instructions (Addendum)
Medication Instructions:  Your physician recommends that you continue on your current medications as directed. Please refer to the Current Medication list given to you today.   Labwork: None ordered   Testing/Procedures: None ordered   Follow-Up: Remote monitoring is used to monitor your Pacemaker from home. This monitoring reduces the number of office visits required to check your device to one time per year. It allows [place] to keep an eye on the functioning of your device to ensure it is working properly. You are scheduled for a device check from home on 04/21/17. You may send your transmission at any time that day. If you have a wireless device, the transmission will be sent automatically. After your physician reviews your transmission, you will receive a postcard with your next transmission date.  Your physician wants you to follow-up in: 12 months with Chanetta Marshall, NP You will receive a reminder letter in the mail two months in advance. If you don't receive a letter, please call our office to schedule the follow-up appointment.       Any Other Special Instructions Will Be Listed Below (If Applicable).     If you need a refill on your cardiac medications before your next appointment, please call your pharmacy.

## 2017-04-20 ENCOUNTER — Telehealth: Payer: Self-pay | Admitting: Neurology

## 2017-04-20 NOTE — Telephone Encounter (Signed)
Dr Brett Fairy has sent in a steroid dose pack for the patient to the pharmacy on file.

## 2017-04-20 NOTE — Telephone Encounter (Signed)
Called the patient and got more details. Pt hasnt taken this before. He states that he will be travelling in [place] and it was last discussed that Dr Brett Fairy would order a standing script in the event that he had a flare up in his cervical area. He states that she mentioned 10 mg of prednisone that he could take if this event occurred.

## 2017-04-20 NOTE — Telephone Encounter (Signed)
Does he need 21 days or continuous use of 10 mg for autoimmune disorder?

## 2017-04-20 NOTE — Telephone Encounter (Signed)
Pt is saying he never got a medication that Dr. Keturah Barre was suppose to give him  prednisone  10MG  sent to [place], [place]

## 2017-04-21 ENCOUNTER — Ambulatory Visit (INDEPENDENT_AMBULATORY_CARE_PROVIDER_SITE_OTHER): Payer: Self-pay | Admitting: *Deleted

## 2017-04-21 ENCOUNTER — Other Ambulatory Visit: Payer: Self-pay

## 2017-04-21 DIAGNOSIS — Z79899 Other long term (current) drug therapy: Secondary | ICD-10-CM

## 2017-04-21 DIAGNOSIS — Z950 Presence of cardiac pacemaker: Secondary | ICD-10-CM

## 2017-04-22 ENCOUNTER — Other Ambulatory Visit: Payer: Self-pay | Admitting: Rheumatology

## 2017-04-22 LAB — CBC WITH DIFFERENTIAL/PLATELET
BASOS PCT: 0.2 %
Basophils Absolute: 12 cells/uL (ref 0–200)
EOS PCT: 0.9 %
Eosinophils Absolute: 52 cells/uL (ref 15–500)
HCT: 45.1 % (ref 38.5–50.0)
Hemoglobin: 15 g/dL (ref 13.2–17.1)
Lymphs Abs: 1206 cells/uL (ref 850–3900)
MCH: 30.5 pg (ref 27.0–33.0)
MCHC: 33.3 g/dL (ref 32.0–36.0)
MCV: 91.9 fL (ref 80.0–100.0)
MONOS PCT: 8.7 %
MPV: 9.7 fL (ref 7.5–12.5)
NEUTROS ABS: 4025 {cells}/uL (ref 1500–7800)
Neutrophils Relative %: 69.4 %
PLATELETS: 259 10*3/uL (ref 140–400)
RBC: 4.91 10*6/uL (ref 4.20–5.80)
RDW: 12.7 % (ref 11.0–15.0)
Total Lymphocyte: 20.8 %
WBC mixed population: 505 cells/uL (ref 200–950)
WBC: 5.8 10*3/uL (ref 3.8–10.8)

## 2017-04-22 LAB — COMPLETE METABOLIC PANEL WITH GFR
AG Ratio: 1.8 (calc) (ref 1.0–2.5)
ALBUMIN MSPROF: 4.4 g/dL (ref 3.6–5.1)
ALT: 13 U/L (ref 9–46)
AST: 9 U/L — ABNORMAL LOW (ref 10–35)
Alkaline phosphatase (APISO): 65 U/L (ref 40–115)
BILIRUBIN TOTAL: 0.6 mg/dL (ref 0.2–1.2)
BUN: 15 mg/dL (ref 7–25)
CALCIUM: 9.4 mg/dL (ref 8.6–10.3)
CO2: 31 mmol/L (ref 20–32)
CREATININE: 0.76 mg/dL (ref 0.70–1.11)
Chloride: 101 mmol/L (ref 98–110)
GFR, EST AFRICAN AMERICAN: 100 mL/min/{1.73_m2} (ref >=60)
GFR, EST NON AFRICAN AMERICAN: 86 mL/min/{1.73_m2} (ref >=60)
GLUCOSE: 221 mg/dL — AB (ref 65–99)
Globulin: 2.4 g/dL (calc) (ref 1.9–3.7)
Potassium: 5.2 mmol/L (ref 3.5–5.3)
Sodium: 140 mmol/L (ref 135–146)
TOTAL PROTEIN: 6.8 g/dL (ref 6.1–8.1)

## 2017-04-24 NOTE — Telephone Encounter (Signed)
Last visit: 01/24/17 Next visit: 07/25/17 Labs: 01/19/17 normal   Okay to refill per Dr. Estanislado Pandy

## 2017-04-25 ENCOUNTER — Telehealth: Payer: Self-pay | Admitting: *Deleted

## 2017-04-25 NOTE — Progress Notes (Signed)
Remote pacemaker transmission.   

## 2017-04-26 NOTE — Telephone Encounter (Signed)
Bone Density- 03/23/17  T-Score -1.9 Osteopenia   Calcium 1200 mg Vitamin D 2000 units  Resistive exercises

## 2017-04-28 ENCOUNTER — Encounter: Payer: Self-pay | Admitting: Cardiology

## 2017-05-08 NOTE — Telephone Encounter (Signed)
Attempted to contact the patient and left message for patient to call the office.  

## 2017-05-09 NOTE — Telephone Encounter (Signed)
Patient advised of results and recommendations. Patient verbalized understanding.  

## 2017-05-23 ENCOUNTER — Telehealth: Payer: Self-pay | Admitting: Rheumatology

## 2017-05-23 LAB — CUP PACEART REMOTE DEVICE CHECK
Battery Remaining Longevity: 131 mo
Battery Voltage: 3.15 V
Brady Statistic AP VS Percent: 0 %
Brady Statistic AS VS Percent: 0.02 %
Date Time Interrogation Session: 20190111141330
Implantable Lead Implant Date: 20180827
Implantable Lead Location: 753859
Lead Channel Impedance Value: 323 Ohm
Lead Channel Pacing Threshold Amplitude: 0.625 V
Lead Channel Pacing Threshold Amplitude: 1 V
Lead Channel Pacing Threshold Pulse Width: 0.4 ms
Lead Channel Sensing Intrinsic Amplitude: 17.75 mV
Lead Channel Sensing Intrinsic Amplitude: 5.25 mV
Lead Channel Sensing Intrinsic Amplitude: 5.25 mV
Lead Channel Setting Pacing Amplitude: 2.5 V
MDC IDC LEAD IMPLANT DT: 20180827
MDC IDC LEAD LOCATION: 753860
MDC IDC MSMT LEADCHNL RA IMPEDANCE VALUE: 418 Ohm
MDC IDC MSMT LEADCHNL RA PACING THRESHOLD PULSEWIDTH: 0.4 ms
MDC IDC MSMT LEADCHNL RV IMPEDANCE VALUE: 380 Ohm
MDC IDC MSMT LEADCHNL RV IMPEDANCE VALUE: 456 Ohm
MDC IDC MSMT LEADCHNL RV SENSING INTR AMPL: 17.75 mV
MDC IDC PG IMPLANT DT: 20180827
MDC IDC SET LEADCHNL RA PACING AMPLITUDE: 1.5 V
MDC IDC SET LEADCHNL RV PACING PULSEWIDTH: 0.4 ms
MDC IDC SET LEADCHNL RV SENSING SENSITIVITY: 4 mV
MDC IDC STAT BRADY AP VP PERCENT: 1.08 %
MDC IDC STAT BRADY AS VP PERCENT: 98.9 %
MDC IDC STAT BRADY RA PERCENT PACED: 1.08 %
MDC IDC STAT BRADY RV PERCENT PACED: 99.98 %

## 2017-05-23 MED ORDER — HYDROXYCHLOROQUINE SULFATE 200 MG PO TABS
200.0000 mg | ORAL_TABLET | Freq: Two times a day (BID) | ORAL | 0 refills | Status: DC
Start: 2017-05-23 — End: 2017-08-25

## 2017-05-23 NOTE — Telephone Encounter (Signed)
Last visit: 01/24/17 Next visit:07/25/17 Labs: 01/19/17 normal PLQ Eye exam: 08/08/16 WNL  Okay to refill per Dr. Estanislado Pandy

## 2017-05-23 NOTE — Telephone Encounter (Signed)
Patient requesting a refill on Plaquenil. Patient is out, and due for meds tomorrow. Patient uses [facility] in [place] [address] phone # [phone] FAX# [phone] Per patient pharmacy was suppose to request refill for him.

## 2017-06-12 ENCOUNTER — Other Ambulatory Visit: Payer: Self-pay | Admitting: Rheumatology

## 2017-06-12 NOTE — Telephone Encounter (Signed)
Last visit: 01/24/17 Next visit:07/25/17 Labs: 04/21/17 Glucose very elevated. AST low but stable. All other labs are WNL.  Okay to refill per Dr. Estanislado Pandy

## 2017-06-20 ENCOUNTER — Telehealth: Payer: Self-pay | Admitting: *Deleted

## 2017-06-20 ENCOUNTER — Telehealth: Payer: Self-pay | Admitting: Cardiology

## 2017-06-20 NOTE — Telephone Encounter (Signed)
Transmission received. Normal device function. [name] made aware and is appreciative.

## 2017-06-20 NOTE — Telephone Encounter (Signed)
Jeffery [name] calling because he got a message on his smart phone BluSync app ([employer] PPM remote monitor application) that the app has not been able to communicate with his pacemaker in 4 days, he cannot see any data that is normally in the app and he cannot send a manual transmission within the app. I have given him the number to call research for assistance as he is part of the BluSync trial. He is concerned- I let him know that they should be able to assist him or let him know that he should call [employer] Carelink for assistance. He verbalizes understanding.

## 2017-06-20 NOTE — Telephone Encounter (Signed)
Patient called and stated that he called [employer] Tech support and they had him do several things to try and resolve his issue but none were successful. [employer] then informed him that they would investigate the issues and get back in touch with him but it may be 24-48 hours. Pt was not happy with this answer so he called the office again. Pt requested that Dr. Rayann Heman give him a call back when he is back in the office. I send Dr. Rayann Heman a staff message informing him of this. Device Tech RN reached out to [employer] Reps and called pt back.

## 2017-06-20 NOTE — Telephone Encounter (Signed)
[name] is very dissatisfied with the customer service from [employer]- they will not get back to him with a response for 24-48 hours. I asked what they had him do to troubleshoot the app- verify phone type, software, turn phone off/on. I have advised him to uninstall the app and and reinstall- this has worked to repopulate his device information, last transmission, next transmission and battery longevity data. His vitals tracking, which he reports he works very hard at keeping track of, is lost and his physical activity tracking data is gone. He would like for [employer] to retreive the data and he reports that there is always a way to retrieve software data. I have reached out to our local [employer] representatives for assistance with his questions and concerns.  I requested that he send a manual transmission through the app to verify that the app will work- he reports that the app said the transmission was sent. It has not yet been received. I let him know that I would call back at the end of the day either way.  [name] is also upset that the Research department was not helpful and he was directed to [employer] tech support. [name] would like for Dr. Rayann [name] to know what is going on and is requesting that Dr. Rayann [name] call [name] when he is back in the office. I let him know that this would be documented for Dr. Jackalyn [name] review.  43 mins on call.

## 2017-06-21 ENCOUNTER — Telehealth: Payer: Self-pay | Admitting: *Deleted

## 2017-06-21 NOTE — Telephone Encounter (Signed)
-----   Message from Thompson Grayer, MD sent at 06/20/2017  3:09 PM EDT ----- Please follow-up with the patient   ----- Message ----- From: Tiajuana Amass, CMA Sent: 06/20/2017   1:36 PM To: Thompson Grayer, MD  Dr. Rayann Heman   [name]' Tuel wants you to give him a call.   Thanks  Pamala Hurry

## 2017-06-21 NOTE — Telephone Encounter (Signed)
lmtcb

## 2017-06-27 NOTE — Telephone Encounter (Signed)
Dr. Rayann Heman aware of Pt complaints.  See phone note with device clinic.  No further action needed at this time.

## 2017-07-11 NOTE — Progress Notes (Signed)
Office Visit Note  Patient: Jeffery [name]             Date of Birth: [birth year]           MRN: [id number]             PCP: Shon Baton, MD Referring: Shon Baton, MD Visit Date: 07/25/2017 Occupation: @GUAROCC @    Subjective:  Medication Management   History of Present Illness: Jeffery [name] is a 83 y.o. male with history of rheumatoid arthritis, polymyalgia rheumatica, osteoarthritis and osteoporosis.  He states he has been doing pretty much the same.  There is no increased joint pain or joint swelling.  He has been tolerating medications well.  No increased muscle weakness is noted.  He continues to have some stiffness in his C-spine thoracic and lumbar spine.  Activities of Daily Living:  Patient reports morning stiffness for 1 hour.   Patient Reports nocturnal pain.  Difficulty dressing/grooming: Denies Difficulty climbing stairs: Denies Difficulty getting out of chair: Denies Difficulty using hands for taps, buttons, cutlery, and/or writing: Denies   Review of Systems  Constitutional: Positive for fatigue. Negative for night sweats.  HENT: Negative for mouth sores, mouth dryness and nose dryness.   Eyes: Negative for redness and dryness.  Respiratory: Negative for shortness of breath and difficulty breathing.   Cardiovascular: Negative for chest pain, palpitations, hypertension, irregular heartbeat and swelling in legs/feet.  Gastrointestinal: Negative for constipation and diarrhea.  Endocrine: Negative for increased urination.  Musculoskeletal: Positive for arthralgias and joint pain. Negative for joint swelling, myalgias, muscle weakness, morning stiffness, muscle tenderness and myalgias.  Skin: Negative for color change, rash, hair loss, nodules/bumps, skin tightness, ulcers and sensitivity to sunlight.  Allergic/Immunologic: Negative for susceptible to infections.  Neurological: Negative for dizziness, fainting, memory loss, night sweats and weakness ( ).    Hematological: Negative for swollen glands.  Psychiatric/Behavioral: Positive for sleep disturbance. Negative for depressed mood. The patient is not nervous/anxious.     PMFS History:  Patient Active Problem List   Diagnosis Date Noted  . Symptomatic bradycardia 02/23/2017  . Status cardiac pacemaker 02/23/2017  . CSA (central sleep apnea) 02/23/2017  . Heart block AV complete ([place]) 01/18/2017  . Vitamin D deficiency 02/08/2016  . Trigger finger, left middle finger 02/08/2016  . High risk medication use 02/08/2016  . Bursitis of right shoulder 02/08/2016  . DDD (degenerative disc disease), lumbar 02/08/2016  . DDD (degenerative disc disease), thoracic 02/08/2016  . Rheumatoid arthritis of multiple sites without rheumatoid factor ([place] [address]) 11/28/2015    Class: Chronic  . Other secondary osteoarthritis of both hands 11/28/2015    Class: Chronic  . PMR (polymyalgia rheumatica) (HCC) 11/28/2015    Class: Chronic  . Osteoporosis 11/28/2015    Class: Chronic  . Prostate cancer ([place]) 11/28/2015    Class: History of  . Duodenal ulcer 11/28/2015    Class: Chronic  . High cholesterol 11/28/2015  . Dizziness 07/16/2015  . Incomplete RBBB 03/31/2015  . Sleep apnea, primary central 03/03/2015  . Polyneuropathy ([place]) 01/24/2013  . Right inguinal hernia 07/05/2012    Past Medical History:  Diagnosis Date  . Arthritis   . BCC (basal cell carcinoma)   . BPH (benign prostatic hypertrophy) 2008  . Cancer [facility]) 2008   Prostate  . Complete heart block ([place])   . Complex sleep apnea syndrome   . Diverticulosis   . ED (erectile dysfunction)   . GERD (gastroesophageal reflux disease)   .  Hemorrhoids   . Hernia, inguinal   . Hyperlipidemia   . IFG (impaired fasting glucose)   . Osteopenia   . Peptic ulcer disease with hemorrhage 1993  . Shingles   . Vitamin D deficiency     Family History  Problem Relation Age of Onset  . Heart failure Mother        Died age 83.  Apparently had valve  surgery.   . Congenital heart disease Brother    Past Surgical History:  Procedure Laterality Date  . BACK SURGERY N/A 1984  . CATARACT EXTRACTION, BILATERAL    . HERNIA REPAIR    . LAMINECTOMY  1984   L 5  . PACEMAKER IMPLANT  12/05/2016   MDT Azure XT DR MRI conditional PPM implanted for CHB by Dr Vista Deck in [place]  . PACEMAKER INSERTION  12/05/2016  . PROSTATE SURGERY  12/2006   Robotic  . TONSILLECTOMY  83 years old  . TRIGGER FINGER RELEASE  03/01/2012   Procedure: MINOR RELEASE TRIGGER FINGER/A-1 PULLEY;  Surgeon: Cammie Sickle., MD;  Location: [place];  Service: Orthopedics;  Laterality: Left;  long finger   Social History   Social History Narrative   Right handed, Retired .Married, 2 kids. Caffeine none.   3 Step      Objective: Vital Signs: BP 110/67 (BP Location: Left Arm, Patient Position: Sitting, Cuff Size: Normal)   Pulse 74   Resp 15   Ht 6' 2"  (1.88 m)   Wt 172 lb (78 kg)   BMI 22.08 kg/m    Physical Exam  Constitutional: He is oriented to person, place, and time. He appears well-developed and well-nourished.  HENT:  Head: Normocephalic and atraumatic.  Eyes: Pupils are equal, round, and reactive to light. Conjunctivae and EOM are normal.  Neck: Normal range of motion. Neck supple.  Cardiovascular: Normal rate, regular rhythm and normal heart sounds.  Pulmonary/Chest: Effort normal and breath sounds normal.  Abdominal: Soft. Bowel sounds are normal.  Neurological: He is alert and oriented to person, place, and time.  Skin: Skin is warm and dry. Capillary refill takes less than 2 seconds.  Psychiatric: He has a normal mood and affect. His behavior is normal.  Nursing note and vitals reviewed.    Musculoskeletal Exam: C-spine thoracic lumbar spine limited range of motion without much discomfort.  Shoulder joints, elbow joints were in good range of motion.  He has incomplete fist formation due to PIP and DIP arthritis and also  previous trigger finger surgery.  Hip joints, knee joints, ankles and MTPs PIPs were in good range of motion.  There was no synovitis on examination.  He had no muscular weakness today. CDAI Exam: CDAI Homunculus Exam:   Joint Counts:  CDAI Tender Joint count: 0 CDAI Swollen Joint count: 0  Global Assessments:  Patient Global Assessment: 2 Provider Global Assessment: 2  CDAI Calculated Score: 4    Investigation: No additional findings.PLQ eye exam: 08/08/2016 CBC Latest Ref Rng & Units 07/20/2017 04/21/2017 01/19/2017  WBC 3.8 - 10.8 Thousand/uL 5.1 5.8 4.9  Hemoglobin 13.2 - 17.1 g/dL 14.3 15.0 14.2  Hematocrit 38.5 - 50.0 % 41.5 45.1 42.0  Platelets 140 - 400 Thousand/uL 231 259 338   CMP Latest Ref Rng & Units 07/20/2017 04/21/2017 01/19/2017  Glucose 65 - 99 mg/dL 151(H) 221(H) 115(H)  BUN 7 - 25 mg/dL 20 15 11   Creatinine 0.70 - 1.11 mg/dL 0.68(L) 0.76 0.68(L)  Sodium 135 - 146 mmol/L  141 140 138  Potassium 3.5 - 5.3 mmol/L 4.8 5.2 5.3  Chloride 98 - 110 mmol/L 103 101 99  CO2 20 - 32 mmol/L 35(H) 31 30  Calcium 8.6 - 10.3 mg/dL 9.5 9.4 9.6  Total Protein 6.1 - 8.1 g/dL 6.7 6.8 6.7  Total Bilirubin 0.2 - 1.2 mg/dL 0.6 0.6 0.8  Alkaline Phos 40 - 115 U/L - - -  AST 10 - 35 U/L 8(L) 9(L) 8(L)  ALT 9 - 46 U/L 13 13 18     Imaging: No results found.  Speciality Comments: No specialty comments available.    Procedures:  No procedures performed Allergies: Sulfasalazine; Demerol [meperidine]; and Sulfa antibiotics   Assessment / Plan:     Visit Diagnoses: Rheumatoid arthritis of multiple sites without rheumatoid factor (HCC) - RF negative, CCP negative, 1433 eta negative, ANA negative.  Patient has no synovitis on examination today.  He has been doing well on current regimen.  High risk medication use - Methotrexate 6 tabs po q wk, Folic acid 1 mg po qd and Plaquenil 200 mg po bideye exam: 08/08/2016.  His labs have been stable.  We will continue to monitor his labs  closely.  PMR (polymyalgia rheumatica) ([place]): He has no increased muscle weakness or tenderness on examination today.  Age-related osteoporosis without current pathological fracture - On Fosamax DEXA 03/23/2017 T-score -1.9, DEXA shows lowest T score -2.0 on 03/18/15.  His DEXA is in the osteopenia range.  I have advised him to discontinue Fosamax.  We will recheck bone density in 2 years.  Primary osteoarthritis of both hands: Joint protection muscle strengthening discussed.  DDD (degenerative disc disease), cervical: Minimal pain  DDD (degenerative disc disease), lumbar chronic discomfort  DDD (degenerative disc disease), thoracic  Other fatigue he continues to have some fatigue.  Bursitis of right shoulder - MRI 07/2016 showed tendinopathy. He responded well to cortisone injection given by Dr. Durward Fortes.  Doing better  Heart block AV complete (HCC) - s/p Pacemaker placement. Followed up by Dr. Rayann Heman now.  History of sleep apnea  Polyneuropathy  History of diabetes mellitus - He was prediabetic and was started on Insulin and Metformin while he was on prednisone taper  History of vitamin D deficiency.  He is on supplement.  History of prostate cancer    Orders: Orders Placed This Encounter  Procedures  . CBC with Differential/Platelet  . CMP14+EGFR   No orders of the defined types were placed in this encounter.   Face-to-face time spent with patient was 30 minutes.  Greater than 50% of time was spent in counseling and coordination of care.  Follow-Up Instructions: Return in about 6 months (around 01/24/2018) for Rheumatoid arthritis, Osteoarthritis, Osteoporosis.   Bo Merino, MD  Note - This record has been created using Editor, commissioning.  Chart creation errors have been sought, but may not always  have been located. Such creation errors do not reflect on  the standard of medical care.

## 2017-07-20 ENCOUNTER — Other Ambulatory Visit: Payer: Self-pay

## 2017-07-20 ENCOUNTER — Telehealth: Payer: Self-pay

## 2017-07-20 DIAGNOSIS — Z79899 Other long term (current) drug therapy: Secondary | ICD-10-CM

## 2017-07-20 LAB — CBC WITH DIFFERENTIAL/PLATELET
BASOS ABS: 10 {cells}/uL (ref 0–200)
Basophils Relative: 0.2 %
EOS PCT: 2.3 %
Eosinophils Absolute: 117 cells/uL (ref 15–500)
HEMATOCRIT: 41.5 % (ref 38.5–50.0)
Hemoglobin: 14.3 g/dL (ref 13.2–17.1)
LYMPHS ABS: 1418 {cells}/uL (ref 850–3900)
MCH: 31.5 pg (ref 27.0–33.0)
MCHC: 34.5 g/dL (ref 32.0–36.0)
MCV: 91.4 fL (ref 80.0–100.0)
MPV: 9.5 fL (ref 7.5–12.5)
Monocytes Relative: 9.4 %
NEUTROS PCT: 60.3 %
Neutro Abs: 3075 cells/uL (ref 1500–7800)
Platelets: 231 10*3/uL (ref 140–400)
RBC: 4.54 10*6/uL (ref 4.20–5.80)
RDW: 12.9 % (ref 11.0–15.0)
TOTAL LYMPHOCYTE: 27.8 %
WBC mixed population: 479 cells/uL (ref 200–950)
WBC: 5.1 10*3/uL (ref 3.8–10.8)

## 2017-07-20 LAB — COMPLETE METABOLIC PANEL WITH GFR
AG RATIO: 1.8 (calc) (ref 1.0–2.5)
ALBUMIN MSPROF: 4.3 g/dL (ref 3.6–5.1)
ALKALINE PHOSPHATASE (APISO): 56 U/L (ref 40–115)
ALT: 13 U/L (ref 9–46)
AST: 8 U/L — ABNORMAL LOW (ref 10–35)
BILIRUBIN TOTAL: 0.6 mg/dL (ref 0.2–1.2)
BUN / CREAT RATIO: 29 (calc) — AB (ref 6–22)
BUN: 20 mg/dL (ref 7–25)
CHLORIDE: 103 mmol/L (ref 98–110)
CO2: 35 mmol/L — ABNORMAL HIGH (ref 20–32)
Calcium: 9.5 mg/dL (ref 8.6–10.3)
Creat: 0.68 mg/dL — ABNORMAL LOW (ref 0.70–1.11)
GFR, EST AFRICAN AMERICAN: 105 mL/min/{1.73_m2} (ref >=60)
GFR, Est Non African American: 90 mL/min/{1.73_m2} (ref >=60)
GLUCOSE: 151 mg/dL — AB (ref 65–99)
Globulin: 2.4 g/dL (calc) (ref 1.9–3.7)
POTASSIUM: 4.8 mmol/L (ref 3.5–5.3)
SODIUM: 141 mmol/L (ref 135–146)
TOTAL PROTEIN: 6.7 g/dL (ref 6.1–8.1)

## 2017-07-20 MED ORDER — DICLOFENAC SODIUM 1 % TD GEL: TRANSDERMAL | 3 refills | Status: DC

## 2017-07-20 NOTE — Telephone Encounter (Signed)
Refill request received via fax.   Last visit: 01/24/2017 Next visit: 07/25/2017  Okay to refill per Dr. Estanislado Pandy.

## 2017-07-24 ENCOUNTER — Ambulatory Visit (INDEPENDENT_AMBULATORY_CARE_PROVIDER_SITE_OTHER): Payer: Medicare Other | Admitting: *Deleted

## 2017-07-24 DIAGNOSIS — R000 Tachycardia, unspecified: Secondary | ICD-10-CM

## 2017-07-25 ENCOUNTER — Telehealth: Payer: Self-pay | Admitting: Rheumatology

## 2017-07-25 ENCOUNTER — Ambulatory Visit (INDEPENDENT_AMBULATORY_CARE_PROVIDER_SITE_OTHER): Payer: Medicare Other | Admitting: Rheumatology

## 2017-07-25 ENCOUNTER — Encounter: Payer: Self-pay | Admitting: Rheumatology

## 2017-07-25 ENCOUNTER — Ambulatory Visit: Payer: Medicare Other | Admitting: Rheumatology

## 2017-07-25 ENCOUNTER — Ambulatory Visit: Payer: Medicare Other | Admitting: Physician Assistant

## 2017-07-25 VITALS — BP 110/67 | HR 74 | Resp 15 | Ht 74.0 in | Wt 172.0 lb

## 2017-07-25 DIAGNOSIS — M810 Age-related osteoporosis without current pathological fracture: Secondary | ICD-10-CM | POA: Diagnosis not present

## 2017-07-25 DIAGNOSIS — M5030 Other cervical disc degeneration, unspecified cervical region: Secondary | ICD-10-CM

## 2017-07-25 DIAGNOSIS — M19041 Primary osteoarthritis, right hand: Secondary | ICD-10-CM | POA: Diagnosis not present

## 2017-07-25 DIAGNOSIS — Z8546 Personal history of malignant neoplasm of prostate: Secondary | ICD-10-CM

## 2017-07-25 DIAGNOSIS — M353 Polymyalgia rheumatica: Secondary | ICD-10-CM

## 2017-07-25 DIAGNOSIS — Z8639 Personal history of other endocrine, nutritional and metabolic disease: Secondary | ICD-10-CM

## 2017-07-25 DIAGNOSIS — M19042 Primary osteoarthritis, left hand: Secondary | ICD-10-CM

## 2017-07-25 DIAGNOSIS — G629 Polyneuropathy, unspecified: Secondary | ICD-10-CM

## 2017-07-25 DIAGNOSIS — Z8669 Personal history of other diseases of the nervous system and sense organs: Secondary | ICD-10-CM | POA: Diagnosis not present

## 2017-07-25 DIAGNOSIS — M5134 Other intervertebral disc degeneration, thoracic region: Secondary | ICD-10-CM

## 2017-07-25 DIAGNOSIS — R5383 Other fatigue: Secondary | ICD-10-CM

## 2017-07-25 DIAGNOSIS — M5136 Other intervertebral disc degeneration, lumbar region: Secondary | ICD-10-CM | POA: Diagnosis not present

## 2017-07-25 DIAGNOSIS — Z79899 Other long term (current) drug therapy: Secondary | ICD-10-CM

## 2017-07-25 DIAGNOSIS — M7551 Bursitis of right shoulder: Secondary | ICD-10-CM | POA: Diagnosis not present

## 2017-07-25 DIAGNOSIS — M0609 Rheumatoid arthritis without rheumatoid factor, multiple sites: Secondary | ICD-10-CM | POA: Diagnosis not present

## 2017-07-25 DIAGNOSIS — I442 Atrioventricular block, complete: Secondary | ICD-10-CM | POA: Diagnosis not present

## 2017-07-25 NOTE — Telephone Encounter (Signed)
Patient advised the medications have been removed from his list.

## 2017-07-25 NOTE — Patient Instructions (Addendum)
Standing Labs We placed an order today for your standing lab work.    Please come back and get your standing labs in July and every 3 months  We have open lab Monday through Friday from 8:30-11:30 AM and 1:30-4:00 PM  at the office of Dr. Bo Merino.   You may experience shorter wait times on Monday and Friday afternoons. The office is located at [address], [place], [place],  [address] No appointment is necessary.   Labs are drawn by [facility].  You may receive a bill from [facility] for your lab work. If you have any questions regarding directions or hours of operation,  please call [phone].    May discontinue Fosamax.

## 2017-07-25 NOTE — Telephone Encounter (Signed)
Patient called stating that there are some medications on his medication list that should be taken off.  They are:  Fosamax - listed twice and he wants it taken off Prednisone - does not take that anymore  Patient would like for you to call him to confirm that these medications have been taken off.  CB#[phone].  Thank you.

## 2017-07-25 NOTE — Addendum Note (Signed)
Addended by: Ofilia Neas on: 07/25/2017 04:30 PM   Modules accepted: Orders

## 2017-07-25 NOTE — Progress Notes (Signed)
Remote pacemaker transmission.   

## 2017-07-26 ENCOUNTER — Encounter: Payer: Self-pay | Admitting: Cardiology

## 2017-07-26 LAB — CUP PACEART REMOTE DEVICE CHECK
Battery Remaining Longevity: 127 mo
Battery Voltage: 3.1 V
Brady Statistic AS VS Percent: 0.01 %
Brady Statistic RA Percent Paced: 3.67 %
Implantable Lead Implant Date: 20180827
Implantable Lead Implant Date: 20180827
Implantable Lead Location: 753860
Implantable Lead Model: 4076
Implantable Pulse Generator Implant Date: 20180827
Lead Channel Impedance Value: 323 Ohm
Lead Channel Impedance Value: 361 Ohm
Lead Channel Impedance Value: 399 Ohm
Lead Channel Pacing Threshold Amplitude: 0.625 V
Lead Channel Pacing Threshold Amplitude: 1 V
Lead Channel Pacing Threshold Pulse Width: 0.4 ms
Lead Channel Pacing Threshold Pulse Width: 0.4 ms
Lead Channel Sensing Intrinsic Amplitude: 17.75 mV
Lead Channel Sensing Intrinsic Amplitude: 4.375 mV
Lead Channel Sensing Intrinsic Amplitude: 4.375 mV
Lead Channel Setting Pacing Amplitude: 1.5 V
Lead Channel Setting Sensing Sensitivity: 4 mV
MDC IDC LEAD LOCATION: 753859
MDC IDC MSMT LEADCHNL RV IMPEDANCE VALUE: 437 Ohm
MDC IDC MSMT LEADCHNL RV SENSING INTR AMPL: 17.75 mV
MDC IDC SESS DTM: 20190415134615
MDC IDC SET LEADCHNL RV PACING AMPLITUDE: 2.5 V
MDC IDC SET LEADCHNL RV PACING PULSEWIDTH: 0.4 ms
MDC IDC STAT BRADY AP VP PERCENT: 3.68 %
MDC IDC STAT BRADY AP VS PERCENT: 0 %
MDC IDC STAT BRADY AS VP PERCENT: 96.32 %
MDC IDC STAT BRADY RV PERCENT PACED: 99.99 %

## 2017-07-31 ENCOUNTER — Other Ambulatory Visit: Payer: Self-pay | Admitting: *Deleted

## 2017-07-31 MED ORDER — OMEGA-3-ACID ETHYL ESTERS 1 G PO CAPS: 1.0000 g | ORAL_CAPSULE | ORAL | 0 refills | Status: DC

## 2017-07-31 NOTE — Telephone Encounter (Signed)
Refill request received via fax  Last Visit: 07/25/17 Next Visit: 01/23/18  Okay to refill per Dr. Estanislado Pandy

## 2017-08-16 IMAGING — DX DG FINGER THUMB 2+V*L*
3 series · 3 of 3 positions shown · non-contrast
Comparison: None.

CLINICAL DATA: Pt fell today while taking a walk, face forward onto
both hands bruising his Lt thumb, can bend the thumb slightly, thumb
is swollen and bruised

EXAM:
LEFT THUMB 2+V

[finger ap]
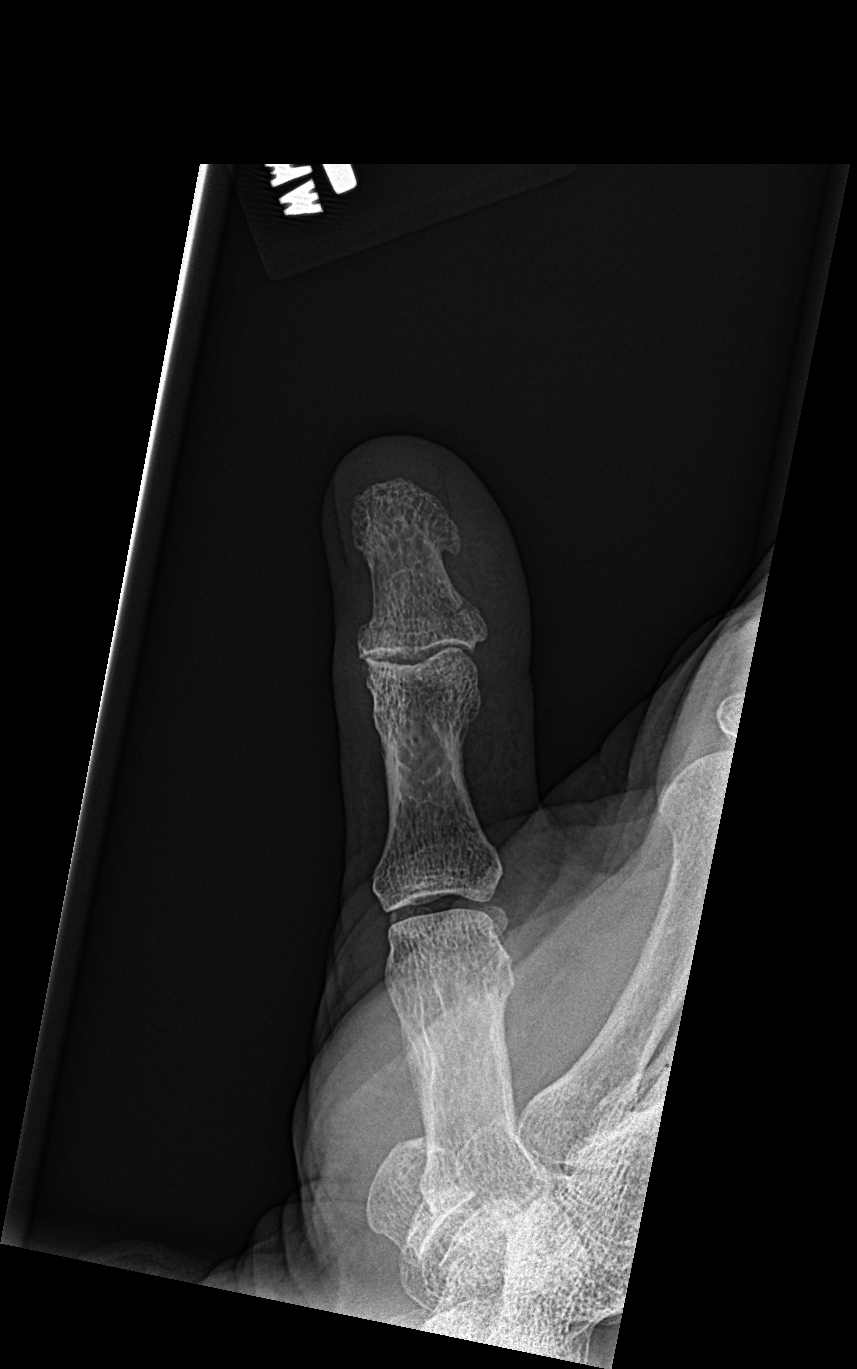

[finger obl]
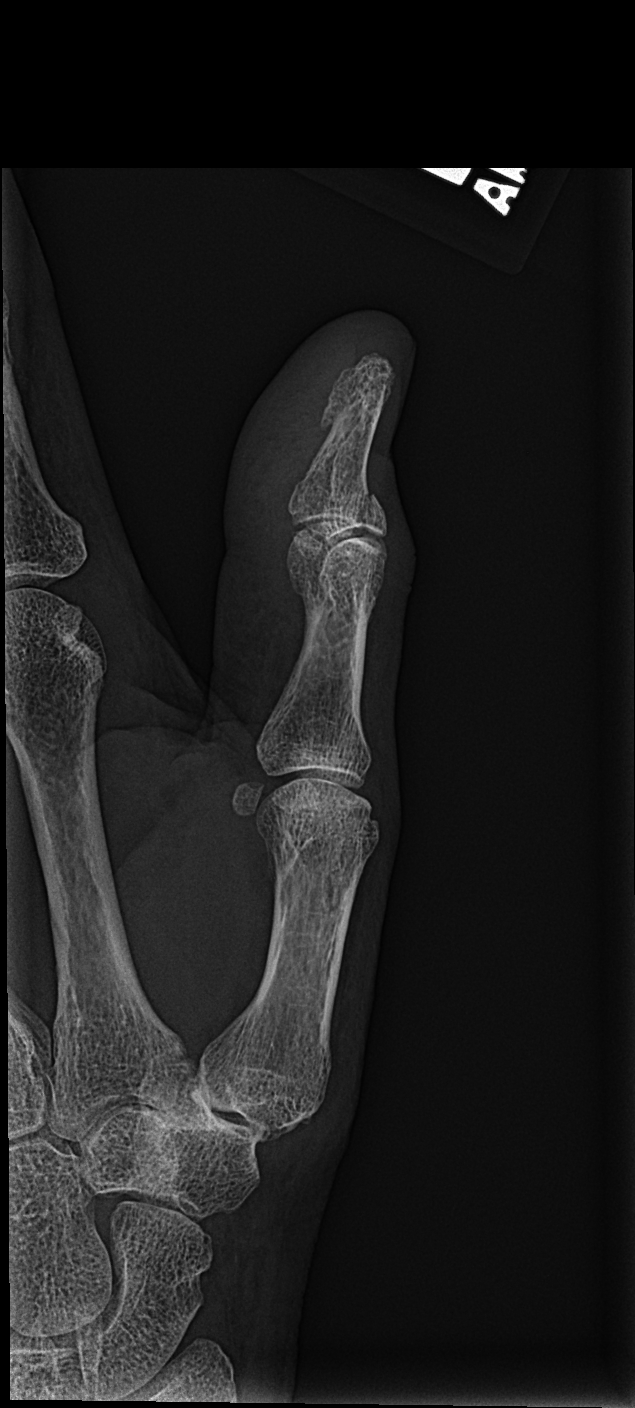

[finger lat]
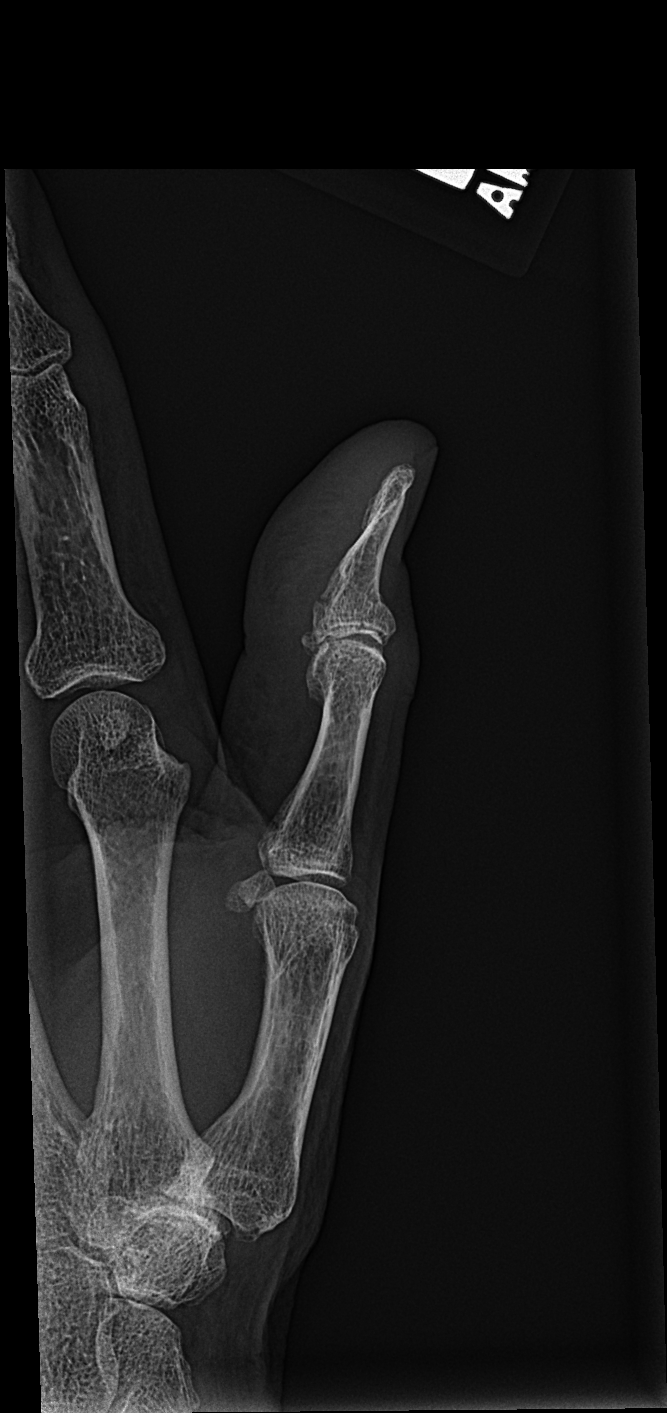

[3 of 3 positions shown; findings below may reference images not displayed]

FINDINGS: Transverse fracture across the base of the distal phalanx. I do not
see definite extension to the subchondral cortex nor significant
step-off deformity. Fracture fragments are in near-anatomic
alignment. No significant osseous degenerative change. No other bone
abnormality is identified.
IMPRESSION: 1. Minimally displaced fracture, base distal phalanx left thumb.

## 2017-08-21 ENCOUNTER — Ambulatory Visit (INDEPENDENT_AMBULATORY_CARE_PROVIDER_SITE_OTHER): Payer: Medicare Other | Admitting: Neurology

## 2017-08-21 ENCOUNTER — Encounter: Payer: Self-pay | Admitting: Neurology

## 2017-08-21 VITALS — BP 122/63 | HR 92 | Ht 74.0 in | Wt 170.0 lb

## 2017-08-21 DIAGNOSIS — T378X5A Adverse effect of other specified systemic anti-infectives and antiparasitics, initial encounter: Secondary | ICD-10-CM | POA: Insufficient documentation

## 2017-08-21 DIAGNOSIS — G629 Polyneuropathy, unspecified: Secondary | ICD-10-CM | POA: Diagnosis not present

## 2017-08-21 DIAGNOSIS — G4731 Primary central sleep apnea: Secondary | ICD-10-CM

## 2017-08-21 DIAGNOSIS — T378X5S Adverse effect of other specified systemic anti-infectives and antiparasitics, sequela: Secondary | ICD-10-CM

## 2017-08-21 NOTE — Progress Notes (Signed)
[name]  Provider:  Larey Seat, M D  Referring Provider: Shon Baton, MD Primary Care Physician:  Shon Baton, MD  Chief Complaint  Patient presents with  . Follow-up    PT ALONE, RM 10, PT states no issues.     HPI: Jeffery [name] is seen here today on 23 February 2017 and a follow-up visit after recent cervical spine MRI was obtained.  In the meantime he has received a pacemaker, and is followed by Dr. Rayann Heman through [facility] heart health.  Symptoms occurred in [place], [place] where he was diagnosed and his care was transferred here, he received a temporary pacemaker in [place] .  Treatment for severe bradycardia. diaphoretic and with palpitation, bowel incontinence.  In the interim he had on 11-28-2016 an MRI - he called Dr. Sherwood Gambler and has not made a follow up with him. Post pacemaker implantation he had left arm and wrist pain, was treated with prednisone and has not had symptoms in neck and hands since! Marland Kitchen   08-21-2017, well adjusted to his pacemaker, no complications. No syncope, remains numb behind the ear. We will follow his complex apnea, neuropathy as needed.  No concerns of sleep , no pain.     11-01-2016. Jeffery [name] is a 83 y.o. male seen here as a revisit  from Dr. Virgina Jock for a newly noted area of dysesthesia behind the left ear.  There is no numbness- the skin sensation is intermittently disturbed, tingly, like a fine touch. I evaluated the area and it does not quite fit a dermatomal distribution-Area is retroauricular and would be innervated by C2. There is no spasm.   This patient of Dr. Virgina Jock Dr. Estanislado Pandy has followed up since May 2011. Initially he was referred for a polysomnogram based on chronic fatigue complaints and was diagnosed as complex apnea.  The apnea short-billed complements central and obstructive, but in a distribution that was more typical for a with apnea.  They were neuropathy related labs sent out and 2011 as well as also to address  chronic fatigue and all returned to normal range. The patient clearly has an accentuated AHI by position and during REM sleep. He has been healthy and maintains an active lifestyle is traveling is a nonsmoker nondrinker and has nausea history. The patient responded well to Wellbutrin on only 100 mg daily taken to eliminate or reduce central apneas. During his last visit in 2013 his fatigue score was 20 and his Epworth score 14 points and he had reported one nocturia on average per night. He estimated his daily sleep time is 7 hours or more. He sometimes naps in the afternoons. This is still the case today:  his Epworth is 11 points, and fatigue score is 24. I would like to add that the patient is on chronic Plaquenil therapy. This was initiated after he developed severe muscle stiffness and relief this is the symptoms within 2-3 weeks. 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 Plaquenil  may contribute somewhat to the development of the neuropathy. Wellbutrin has had some positive effect on the level of alertness and daytime and may also be due discomfort.  03-03-15 Jeffery [name] reports that he had taken a drug holiday and for about 6 months did not use Plaquenil. As the responds he noted again stiffness in his hands and inflammation. Dr. Estanislado Pandy , his rheumatologist, had placed him on his prednisone taper but then Plaquenil was  reinitiated about 3 months ago. I would like to add that the stiffness has never affected his lower extremities or feet and has always been manifested in his hands. He also has 3 trigger fingers. Fatigue is stable. His Epworth score is endorsed at 9 points and his fatigue severity at 34 points. The geriatric depression score was  endorsed at 2 points.  We are seeing today after Jeffery [name] suffered a sudden fall with a laceration of the left for head and fracture of the left thumb.  He was walking briskly but on an uneven surface and initially believed that he tripped and fell. But he had as sudden premonition to slow down,  before he fell which tells me that he most likely fainted. He has no history of seizures, he was able to break his fall but had no focal deficits Jeffery [name] is a fall and also did not feel that he had problems was generating words, but his thought process was scrambled or that he had any focal motor deficits. This rules out a TIA.  Once he was on the ground he was immediately conscious and aware of his surroundings- he was not combative and he will actually waited patiently for EMS to arrive. The ED reports were reviewed here with him. A cardiac workup was initiated and his transthoracic echo cardiogram revealed no abnormalities.  He also underwent carotid Doppler studies which showed minor in my opinion trivial calcifications at the bifurcation. These would be in the range between 1 and 40% the nonsurgical indication range. I would like for the patient to take a baby aspirin is possibly enteric coated once a day should he develop bruising I will like him to take it every other day. I also discussed with him that we have never established at baseline for his neuropathy. I would like for him as early as available in the new year to undergo an EMG and nerve conduction study to evaluate the degree of peripheral neuropathy and axonal versus demyelinating quality of the neuropathy. The patient is interested. A recent comprehensive metabolic panel was Dr. Franchot Gallo was interpreted as normal , as well as a CBC diff  which she has every 3 months.   Review of Systems: Out of a complete 14 system review, the patient complains of only the following symptoms, and all other reviewed systems are negative. Intermittent fatigue. Fatigue is stable. His Epworth score is endorsed at 7 points and his fatigue severity at 27 points.  The geriatric depression score was  endorsed at 2 points.  night blindness. Dysesthesias resolved under steroids, bradycardia on pacemaker.    Eye clinic at [facility] - Plaquenil (Hydroxychloroquine) Screening  I, the attending physician, performed the HPI with the patient and updated the documentation appropriately. The patient is here for a return visit. The patient is here today for Plaquenil (Hydroxychloroquine) screening. Patient has Rheumatoid Arthritis. The patient has been taking Plaquenil (Hydroxychloroquine) for more than 5 years. The patient has been taking Plaquenil (Hydroxychloroquine) for approximately 7 years. The patient reports no changes in their vision. Known co-existing eye disease: macular degeneration. The patient does not request a new glasses prescription. Review of eye symptoms reveals: Negative for flashes, eye pain and floaters.  Last edited by Hyman Hopes, MD on 08/17/2017 3:35 PM. (History)     Social History   Socioeconomic History  . Marital status: Married    Spouse name: Not on file  . Number of children: 2  . Years of education: Not  on file  . Highest education level: Not on file  Occupational History  . Not on file  Social Needs  . Financial resource strain: Not on file  . Food insecurity:    Worry: Not on file    Inability: Not on file  . Transportation needs:    Medical: Not on file    Non-medical: Not on file  Tobacco Use  . Smoking status: Never Smoker  . Smokeless tobacco: Never Used  Substance and Sexual Activity  . Alcohol use: Not Currently    Frequency: Never  . Drug use: No  . Sexual activity: Not on file  Lifestyle  . Physical activity:    Days per week: Not on file    Minutes per session: Not on file  . Stress: Not on file  Relationships  . Social connections:    Talks on phone: Not on file    Gets together: Not on file    Attends religious service: Not on file    Active member of club or organization: Not on file    Attends meetings of clubs or organizations: Not on file     Relationship status: Not on file  . Intimate partner violence:    Fear of current or ex partner: Not on file    Emotionally abused: Not on file    Physically abused: Not on file    Forced sexual activity: Not on file  Other Topics Concern  . Not on file  Social History Narrative   Right handed, Retired .Married, 2 kids. Caffeine none.   3 Step     Family History  Problem Relation Age of Onset  . Heart failure Mother        Died age 30.  Apparently had valve surgery.   . Congenital heart disease Brother     Past Medical History:  Diagnosis Date  . Arthritis   . BCC (basal cell carcinoma)   . BPH (benign prostatic hypertrophy) 2008  . Cancer [facility]) 2008   Prostate  . Complete heart block ([place])   . Complex sleep apnea syndrome   . Diverticulosis   . ED (erectile dysfunction)   . GERD (gastroesophageal reflux disease)   . Hemorrhoids   . Hernia, inguinal   . Hyperlipidemia   . IFG (impaired fasting glucose)   . Osteopenia   . Peptic ulcer disease with hemorrhage 1993  . Shingles   . Vitamin D deficiency     Past Surgical History:  Procedure Laterality Date  . BACK SURGERY N/A 1984  . CATARACT EXTRACTION, BILATERAL    . HERNIA REPAIR    . LAMINECTOMY  1984   L 5  . PACEMAKER IMPLANT  12/05/2016   MDT Azure XT DR MRI conditional PPM implanted for CHB by Dr Vista Deck in [place]  . PACEMAKER INSERTION  12/05/2016  . PROSTATE SURGERY  12/2006   Robotic  . TONSILLECTOMY  83 years old  . TRIGGER FINGER RELEASE  03/01/2012   Procedure: MINOR RELEASE TRIGGER FINGER/A-1 PULLEY;  Surgeon: Cammie Sickle., MD;  Location: [place];  Service: Orthopedics;  Laterality: Left;  long finger    Current Outpatient Medications  Medication Sig Dispense Refill  . acetaminophen (TYLENOL) 500 MG tablet Take 500 mg by mouth every 6 (six) hours as needed.    . Calcium Carbonate (CALCIUM 600 PO) Take 600 mg by mouth daily.    . Cholecalciferol (VITAMIN D) 2000 units CAPS  Take by mouth daily.    Marland Kitchen  Coenzyme Q10 200 MG capsule Take 200 mg by mouth 2 (two) times daily.     . colesevelam (WELCHOL) 625 MG tablet Take 1,250 mg by mouth 3 (three) times daily.     . D3-50 50000 units capsule take 1 capsule by mouth ONCE A MONTH 12 capsule 0  . diclofenac sodium (VOLTAREN) 1 % GEL Apply 3 grams to 3 large joints, up to 3 times daily. 3 Tube 3  . folic acid (FOLVITE) 1 MG tablet take 1 tablet by mouth once daily 90 tablet 1  . hydroxychloroquine (PLAQUENIL) 200 MG tablet Take 1 tablet (200 mg total) by mouth 2 (two) times daily. 180 tablet 0  . metFORMIN (GLUCOPHAGE) 500 MG tablet Take 500 mg daily by mouth.     . methotrexate (RHEUMATREX) 2.5 MG tablet take 6 tablets by mouth ONCE WEEKLY 72 tablet 0  . omega-3 acid ethyl esters (LOVAZA) 1 g capsule Take 1 capsule (1 g total) by mouth 3 (three) times a week. 36 capsule 0  . vitamin E 100 UNIT capsule Take 100 Units by mouth daily.     No current facility-administered medications for this visit.     Allergies as of 08/21/2017 - Review Complete 08/21/2017  Allergen Reaction Noted  . Sulfasalazine Anaphylaxis 02/08/2016  . Demerol [meperidine] Nausea Only 03/01/2012  . Sulfa antibiotics  02/23/2017    Vitals: BP 122/63   Pulse 92   Ht 6\' 2"  (1.88 m)   Wt 170 lb (77.1 kg)   BMI 21.83 kg/m  Last Weight:  Wt Readings from Last 1 Encounters:  08/21/17 170 lb (77.1 kg)   Last Height:   Ht Readings from Last 1 Encounters:  08/21/17 6\' 2"  (1.88 m)   DOB: 30-Jul-1934 MRN: [id number]   Physical exam: General: The patient is awake, alert and appears not in acute distress. The patient is well groomed. Head: Normocephalic, atraumatic. Neck is supple. Mallampati 2 , neck circumference: 15 inches.   Cardiovascular:  Regular rate and rhythm, without  murmurs -without distended neck veins. Respiratory: Lungs are clear to auscultation. Skin:  Without evidence of edema, or rash Trunk: BMI is normal. He lost weight  by design.   Neurologic exam : The patient is awake and alert, oriented to place and time.   Memory subjective described as intact. There is a normal attention span & concentration ability.  Speech is fluent without dysarthria, dysphonia or aphasia.  Mood and affect are appropriate.  Cranial nerves: Pupils are equal and briskly reactive to light. Funduscopic exam without  evidence of pallor or edema.  Extraocular movements  in vertical and horizontal planes intact and without nystagmus. Visual fields by finger perimetry are intact.Hearing to finger rub intact. Facial sensation intact to fine touch. Facial motor strength is symmetric and tongue and uvula move midline. Motor exam:  Normal tone and normal muscle bulk and symmetric normal strength in all extremities.  Sensory:  Fine touch, pinprick and vibration were normal. I did not test the planta pedis.  Coordination: Rapid alternating movements in the fingers/hands is normal.   Finger-to-nose maneuver tested and normal without evidence of ataxia, dysmetria or tremor.  Gait and station: Patient walks without assistive device. Strength within normal limits. Stance is stable and normal. Deep tendon reflexes: in the extremities are attenuated , symmetric and intact.   Assessment:  After physical and neurologic examination, review of laboratory studies, imaging, neurophysiology testing and pre-existing records, assessment is   1) status post 2 previously unexplained falls,  dehydration or documented bradycardia- than received PM and none since.  2) Neuropathy with autoimmune disease, plaquenil and regular eye check up with Dr. Gershon Crane, 3) CSA- stays on PAP. Yearly follow up.    Plan:  Treatment plan and additional workup :  No changes in CPAP therapy.   Blood work every 6 month with Dr. Estanislado Pandy.  Rv in 6 month .      Larey Seat, MD   CC Dyke Brackett, MD and Shon Baton, MD

## 2017-08-23 ENCOUNTER — Ambulatory Visit: Payer: Medicare Other | Admitting: Neurology

## 2017-08-25 ENCOUNTER — Other Ambulatory Visit: Payer: Self-pay | Admitting: Rheumatology

## 2017-08-25 NOTE — Telephone Encounter (Signed)
Last Visit: 07/25/17 Next Visit: 01/23/18 Labs: 4/11/19Glucose is elevated. Labs are stable. PLQ Eye Exam: 5/9/19WNL  Okay to refill per Dr. Deveshwar  

## 2017-09-01 ENCOUNTER — Telehealth: Payer: Self-pay | Admitting: Rheumatology

## 2017-09-01 ENCOUNTER — Other Ambulatory Visit: Payer: Self-pay | Admitting: Rheumatology

## 2017-09-01 NOTE — Telephone Encounter (Signed)
Last Visit: 07/25/17 Next Visit: 01/23/18 Labs: 07/20/17 Glucose is elevated. Labs are stable.  Okay to refill per Dr. Estanislado Pandy

## 2017-09-01 NOTE — Telephone Encounter (Signed)
Prescription has been sent to the pharmacy. 

## 2017-09-01 NOTE — Telephone Encounter (Signed)
Patient called requesting prescription refill of Methotrexate be sent to [facility] on [address].  Patient is out of medication and is requesting to pick up the prescription this afternoon.

## 2017-09-25 ENCOUNTER — Encounter (INDEPENDENT_AMBULATORY_CARE_PROVIDER_SITE_OTHER): Payer: Self-pay | Admitting: Orthopaedic Surgery

## 2017-09-25 ENCOUNTER — Ambulatory Visit (INDEPENDENT_AMBULATORY_CARE_PROVIDER_SITE_OTHER): Payer: Medicare Other | Admitting: Orthopaedic Surgery

## 2017-09-25 ENCOUNTER — Ambulatory Visit (INDEPENDENT_AMBULATORY_CARE_PROVIDER_SITE_OTHER): Payer: Medicare Other

## 2017-09-25 VITALS — BP 144/75 | HR 78 | Resp 16 | Ht 74.0 in | Wt 170.0 lb

## 2017-09-25 DIAGNOSIS — M545 Low back pain, unspecified: Secondary | ICD-10-CM

## 2017-09-25 NOTE — Progress Notes (Signed)
Office Visit Note   Patient: Jeffery [name]           Date of Birth: [birth year]           MRN: [id number] Visit Date: 09/25/2017              Requested by: Shon Baton, [place], [place] [address] PCP: Shon Baton, MD   Assessment & Plan: Visit Diagnoses:  1. Acute midline low back pain without sciatica     Plan: Degenerative arthritis lumbar spine.  May continue with the Flector patch and Tylenol will try lumbar support.  Office several weeks if no improvement  Follow-Up Instructions: No follow-ups on file.   Orders:  Orders Placed This Encounter  Procedures  . XR Lumbar Spine 2-3 Views   No orders of the defined types were placed in this encounter.     Procedures: No procedures performed   Clinical Data: No additional findings.   Subjective: Chief Complaint  Patient presents with  . Lower Back - Pain  . Back Pain    Back pain x 3 days, heavy yard work during previous week, difficulty sitting and sleeping, Aleve helps, flector patch x 3 days, no injury, 1984 L4-L5 surgery, muscle spasms, no sciatica   [name] experience onset of low back pain this past week after working in the yard.  Pain seems to be better when he standing but worse when he sleeping or sitting.  There is no referred pain to either lower extremity.  He denies any numbness or tingling.  Pain is localized along the lower lumbar spine.  Has had prior surgery in 1984 with sporadic mild discomfort.  On this occasion he seems to have had a little bit more pain that is controlled with combination of Aleve and Tylenol.  Has a pacemaker but is not on blood thinner.  Also has prior history of arthritis of the cervical spine  HPI  Review of Systems  Constitutional: Positive for activity change.  HENT: Negative for trouble swallowing.   Eyes: Negative for pain.  Respiratory: Negative for shortness of breath.   Cardiovascular: Negative for leg swelling.  Gastrointestinal: Negative  for constipation.  Endocrine: Negative for cold intolerance.  Genitourinary: Negative for difficulty urinating.  Musculoskeletal: Positive for back pain.  Skin: Negative for rash.  Allergic/Immunologic: Positive for food allergies.  Neurological: Negative for weakness and numbness.  Hematological: Does not bruise/bleed easily.  Psychiatric/Behavioral: Positive for sleep disturbance.     Objective: Vital Signs: BP (!) 144/75 (BP Location: Left Arm, Patient Position: Standing, Cuff Size: Normal)   Pulse 78   Resp 16   Ht 6\' 2"  (1.88 m)   Wt 170 lb (77.1 kg)   BMI 21.83 kg/m   Physical Exam  Constitutional: He is oriented to person, place, and time. He appears well-developed and well-nourished.  HENT:  Mouth/Throat: Oropharynx is clear and moist.  Eyes: Pupils are equal, round, and reactive to light. EOM are normal.  Pulmonary/Chest: Effort normal.  Neurological: He is alert and oriented to person, place, and time.  Skin: Skin is warm and dry.  Psychiatric: He has a normal mood and affect. His behavior is normal.    Ortho Exam awake alert and oriented x3.  A little uncomfortable sitting more comfortable standing.  Walks with very minimal flexion at the waist.  Some mild percussible tenderness along the lower lumbar spine.  Painless range of motion of both hips.  Painless range of motion of  knees.  No distal edema.  Neurovascular exam intact.  Specialty Comments:  No specialty comments available.  Imaging: No results found.   PMFS History: Patient Active Problem List   Diagnosis Date Noted  . Neuropathy 08/21/2017  . Hydroxychloroquine causing adverse effect in therapeutic use 08/21/2017  . Symptomatic bradycardia 02/23/2017  . Status cardiac pacemaker 02/23/2017  . CSA (central sleep apnea) 02/23/2017  . Heart block AV complete ([place]) 01/18/2017  . Vitamin D deficiency 02/08/2016  . Trigger finger, left middle finger 02/08/2016  . High risk medication use 02/08/2016    . Bursitis of right shoulder 02/08/2016  . DDD (degenerative disc disease), lumbar 02/08/2016  . DDD (degenerative disc disease), thoracic 02/08/2016  . Rheumatoid arthritis of multiple sites without rheumatoid factor ([place]) 11/28/2015    Class: Chronic  . Other secondary osteoarthritis of both hands 11/28/2015    Class: Chronic  . PMR (polymyalgia rheumatica) ([facility]) 11/28/2015    Class: Chronic  . Osteoporosis 11/28/2015    Class: Chronic  . Prostate cancer ([place]) 11/28/2015    Class: History of  . Duodenal ulcer 11/28/2015    Class: Chronic  . High cholesterol 11/28/2015  . Dizziness 07/16/2015  . Incomplete RBBB 03/31/2015  . Sleep apnea, primary central 03/03/2015  . Polyneuropathy ([place]) 01/24/2013  . Right inguinal hernia 07/05/2012   Past Medical History:  Diagnosis Date  . Arthritis   . BCC (basal cell carcinoma)   . BPH (benign prostatic hypertrophy) 2008  . Cancer [facility]) 2008   Prostate  . Complete heart block ([place])   . Complex sleep apnea syndrome   . Diverticulosis   . ED (erectile dysfunction)   . GERD (gastroesophageal reflux disease)   . Hemorrhoids   . Hernia, inguinal   . Hyperlipidemia   . IFG (impaired fasting glucose)   . Osteopenia   . Peptic ulcer disease with hemorrhage 1993  . Shingles   . Vitamin D deficiency     Family History  Problem Relation Age of Onset  . Heart failure Mother        Died age 63.  Apparently had valve surgery.   . Congenital heart disease Brother     Past Surgical History:  Procedure Laterality Date  . BACK SURGERY N/A 1984  . CATARACT EXTRACTION, BILATERAL    . HERNIA REPAIR    . LAMINECTOMY  1984   L 5  . PACEMAKER IMPLANT  12/05/2016   MDT Azure XT DR MRI conditional PPM implanted for CHB by Dr Vista Deck in [place]  . PACEMAKER INSERTION  12/05/2016  . PROSTATE SURGERY  12/2006   Robotic  . TONSILLECTOMY  83 years old  . TRIGGER FINGER RELEASE  03/01/2012   Procedure: MINOR RELEASE TRIGGER FINGER/A-1 PULLEY;   Surgeon: Cammie Sickle., MD;  Location: [facility];  Service: Orthopedics;  Laterality: Left;  long finger   Social History   Occupational History  . Not on file  Tobacco Use  . Smoking status: Never Smoker  . Smokeless tobacco: Never Used  Substance and Sexual Activity  . Alcohol use: Not Currently    Frequency: Never  . Drug use: No  . Sexual activity: Not on file

## 2017-09-27 ENCOUNTER — Telehealth (INDEPENDENT_AMBULATORY_CARE_PROVIDER_SITE_OTHER): Payer: Self-pay | Admitting: Orthopaedic Surgery

## 2017-09-27 NOTE — Telephone Encounter (Signed)
Please advise 

## 2017-09-27 NOTE — Telephone Encounter (Signed)
Patient request refill on Flector patches sent to [facility] on [address]. Patient was given an rx for these from Dr. Estanislado Pandy awhile back, and would like to have a new rx for his pain he is having now. Please call to advise.

## 2017-09-27 NOTE — Telephone Encounter (Signed)
Opened in error

## 2017-09-28 NOTE — Telephone Encounter (Signed)
Ok to prescribe

## 2017-09-29 ENCOUNTER — Telehealth: Payer: Self-pay | Admitting: Orthopaedic Surgery

## 2017-09-29 ENCOUNTER — Telehealth (INDEPENDENT_AMBULATORY_CARE_PROVIDER_SITE_OTHER): Payer: Self-pay | Admitting: Orthopaedic Surgery

## 2017-09-29 MED ORDER — DICLOFENAC EPOLAMINE 1.3 % TD PTCH: MEDICATED_PATCH | TRANSDERMAL | 1 refills | Status: DC

## 2017-09-29 NOTE — Telephone Encounter (Signed)
Called pt to inquire about what med he needed refilled as I did not see any patches on his medication list. The pt states he seen Dr Estanislado Pandy a few years ago who perscribed him the patches and he needs a refill. Please advise

## 2017-09-29 NOTE — Telephone Encounter (Signed)
Patient states he would like a refill of flector patches. Spoke to Dr. Estanislado Pandy about refill request and she approved the refill. Patient requested refill be sent to [facility] on [address]

## 2017-09-29 NOTE — Telephone Encounter (Signed)
Patient left a voicemail checking the status of his refill request for his patches to be sent to [facility] on [address].

## 2017-09-29 NOTE — Telephone Encounter (Signed)
error 

## 2017-10-02 ENCOUNTER — Telehealth: Payer: Self-pay

## 2017-10-02 ENCOUNTER — Telehealth: Payer: Self-pay | Admitting: *Deleted

## 2017-10-02 ENCOUNTER — Other Ambulatory Visit: Payer: Self-pay | Admitting: Rheumatology

## 2017-10-02 NOTE — Telephone Encounter (Signed)
[name] calling to check on [employer] monitoring. He is concerned that he will not be in the [employer] trial any longer with the [facility]link transfer to [place], [place] clinic. I advised that research cannot gather data if he is enrolled in a different clinic but he will not have any changes to the way he sends a transmission with his iphone.  He will spend 6 months in [place], [place] at his Summer home and then transfer back to [place].

## 2017-10-02 NOTE — Telephone Encounter (Signed)
I talked to patient and he do wants [place] to release his information in  [employer].

## 2017-10-02 NOTE — Telephone Encounter (Signed)
Last Visit: 07/25/17 Next Visit: 01/23/18 Labs: 4/11/19Glucose is elevated. Labs are stable. PLQ Eye Exam: 5/9/19WNL  Okay to refill per Dr. Estanislado Pandy

## 2017-10-23 ENCOUNTER — Other Ambulatory Visit: Payer: Self-pay | Admitting: Rheumatology

## 2017-10-23 NOTE — Telephone Encounter (Signed)
Last Visit: 07/25/17 Next Visit: 01/23/18  Okay to refill per Dr. Estanislado Pandy

## 2017-10-26 ENCOUNTER — Other Ambulatory Visit: Payer: Self-pay

## 2017-10-26 DIAGNOSIS — Z79899 Other long term (current) drug therapy: Secondary | ICD-10-CM

## 2017-10-27 LAB — COMPLETE METABOLIC PANEL WITH GFR
AG Ratio: 1.8 (calc) (ref 1.0–2.5)
ALKALINE PHOSPHATASE (APISO): 63 U/L (ref 40–115)
ALT: 13 U/L (ref 9–46)
AST: 9 U/L — AB (ref 10–35)
Albumin: 4.4 g/dL (ref 3.6–5.1)
BUN: 20 mg/dL (ref 7–25)
CALCIUM: 9.8 mg/dL (ref 8.6–10.3)
CHLORIDE: 101 mmol/L (ref 98–110)
CO2: 30 mmol/L (ref 20–32)
Creat: 0.72 mg/dL (ref 0.70–1.11)
GFR, EST NON AFRICAN AMERICAN: 88 mL/min/{1.73_m2} (ref >=60)
GFR, Est African American: 102 mL/min/{1.73_m2} (ref >=60)
Globulin: 2.5 g/dL (calc) (ref 1.9–3.7)
Glucose, Bld: 88 mg/dL (ref 65–99)
Potassium: 4.7 mmol/L (ref 3.5–5.3)
Sodium: 139 mmol/L (ref 135–146)
Total Bilirubin: 0.7 mg/dL (ref 0.2–1.2)
Total Protein: 6.9 g/dL (ref 6.1–8.1)

## 2017-10-27 LAB — CBC WITH DIFFERENTIAL/PLATELET
BASOS ABS: 7 {cells}/uL (ref 0–200)
Basophils Relative: 0.1 %
EOS PCT: 1.5 %
Eosinophils Absolute: 110 cells/uL (ref 15–500)
HCT: 45.3 % (ref 38.5–50.0)
Hemoglobin: 15.4 g/dL (ref 13.2–17.1)
Lymphs Abs: 1869 cells/uL (ref 850–3900)
MCH: 32 pg (ref 27.0–33.0)
MCHC: 34 g/dL (ref 32.0–36.0)
MCV: 94 fL (ref 80.0–100.0)
MONOS PCT: 9.1 %
MPV: 9.9 fL (ref 7.5–12.5)
NEUTROS PCT: 63.7 %
Neutro Abs: 4650 cells/uL (ref 1500–7800)
PLATELETS: 236 10*3/uL (ref 140–400)
RBC: 4.82 10*6/uL (ref 4.20–5.80)
RDW: 12 % (ref 11.0–15.0)
TOTAL LYMPHOCYTE: 25.6 %
WBC mixed population: 664 cells/uL (ref 200–950)
WBC: 7.3 10*3/uL (ref 3.8–10.8)

## 2017-11-07 ENCOUNTER — Other Ambulatory Visit: Payer: Self-pay | Admitting: Rheumatology

## 2017-11-07 NOTE — Telephone Encounter (Signed)
Last Visit: 07/25/17 Next Visit: 01/23/18  Okay to refill per Dr. Estanislado Pandy

## 2017-11-30 ENCOUNTER — Other Ambulatory Visit: Payer: Self-pay | Admitting: Rheumatology

## 2017-11-30 NOTE — Telephone Encounter (Signed)
Last Visit: 07/25/17 Next Visit: 01/23/18 Labs: 10/26/17 AST stable. All other labs are WNL.  Okay to refill per Dr. Estanislado Pandy

## 2017-12-20 ENCOUNTER — Other Ambulatory Visit: Payer: Self-pay | Admitting: Rheumatology

## 2017-12-20 NOTE — Telephone Encounter (Signed)
Last Visit: 07/25/17 Next Visit: 01/23/18  Okay to refill per Dr. Estanislado Pandy

## 2018-01-02 ENCOUNTER — Telehealth: Payer: Self-pay

## 2018-01-02 ENCOUNTER — Other Ambulatory Visit: Payer: Self-pay | Admitting: Orthopedic Surgery

## 2018-01-02 NOTE — Telephone Encounter (Signed)
   [facility] Pre-operative Risk Assessment    Request for surgical clearance:  1. What type of surgery is being performed? Release A-1 Pulley of the Left Ring Finger   2. When is this surgery scheduled? February 08, 2018   3. What type of clearance is required (medical clearance vs. Pharmacy clearance to hold med vs. Both)? medical  4. Are there any medications that need to be held prior to surgery and how long?not specified   5. Practice name and name of physician performing surgery? Dr. Daryll Brod at the [facility]    6. What is your office phone number [phone]    7.   What is your office fax number [phone] attn Cyndee Brightly RN  8.   Anesthesia type (None, local, MAC, general) ? Not specified

## 2018-01-02 NOTE — Telephone Encounter (Signed)
   Primary Cardiologist: Minus Breeding, MD  Chart reviewed as part of pre-operative protocol coverage. Patient was contacted 01/02/2018 in reference to pre-operative risk assessment for pending surgery as outlined below.  Jeffery [name] was last seen on 03/29/17 by Dr. Rayann Heman.  Since that day, Jeffery [name] has done well with no cardiac complaints. He has a PPM for complete heart block.  He has been staying in [place] having his pacemaker monitored there.  He reports normal functioning with no new issues.  He has no history of CAD or CHF.  Therefore, based on ACC/AHA guidelines, the patient would be at acceptable risk for the planned procedure without further cardiovascular testing.   I will route this recommendation to the requesting party via Epic fax function and remove from pre-op pool.  Please call with questions.  Daune Perch, NP 01/02/2018, 4:41 PM

## 2018-01-18 NOTE — Progress Notes (Signed)
Office Visit Note  Patient: Jeffery [name]             Date of Birth: 11-May-1934           MRN: [id number]             PCP: Shon Baton, MD Referring: Shon Baton, MD Visit Date: 02/01/2018 Occupation: @GUAROCC @  Subjective:  Arthritis    History of Present Illness: Jeffery [name] is a 83 y.o. male with history of rheumatoid arthritis, PMR and osteoarthritis.  He states he has occasional discomfort in his joints but no joint swelling.  He is planning to have left fourth trigger finger release by Dr. Fredna Dow in near future.  Continues to have some discomfort in his shoulders and lower back.  Activities of Daily Living:  Patient reports morning stiffness for 24 hours.   Patient Denies nocturnal pain.  Difficulty dressing/grooming: Denies Difficulty climbing stairs: Denies Difficulty getting out of chair: Denies Difficulty using hands for taps, buttons, cutlery, and/or writing: Denies  Review of Systems  Constitutional: Positive for fatigue and weight loss.  HENT: Negative for mouth dryness.   Eyes: Negative for dryness.  Respiratory: Negative for shortness of breath.   Cardiovascular: Negative for swelling in legs/feet.  Gastrointestinal: Negative for constipation.  Endocrine: Negative for increased urination.  Genitourinary: Negative for difficulty urinating.  Musculoskeletal: Positive for arthralgias, joint pain, muscle weakness and morning stiffness. Negative for joint swelling and muscle tenderness.  Skin: Negative for rash.  Neurological: Negative for numbness.  Hematological: Negative for bruising/bleeding tendency.  Psychiatric/Behavioral: Positive for sleep disturbance.    PMFS History:  Patient Active Problem List   Diagnosis Date Noted  . Neuropathy 08/21/2017  . Hydroxychloroquine causing adverse effect in therapeutic use 08/21/2017  . Symptomatic bradycardia 02/23/2017  . Status cardiac pacemaker 02/23/2017  . CSA (central sleep apnea) 02/23/2017  .  Heart block AV complete ([place]) 01/18/2017  . Vitamin D deficiency 02/08/2016  . Trigger finger, left middle finger 02/08/2016  . High risk medication use 02/08/2016  . Bursitis of right shoulder 02/08/2016  . DDD (degenerative disc disease), lumbar 02/08/2016  . DDD (degenerative disc disease), thoracic 02/08/2016  . Rheumatoid arthritis of multiple sites without rheumatoid factor ([place]) 11/28/2015    Class: Chronic  . Other secondary osteoarthritis of both hands 11/28/2015    Class: Chronic  . PMR (polymyalgia rheumatica) (HCC) 11/28/2015    Class: Chronic  . Osteoporosis 11/28/2015    Class: Chronic  . Prostate cancer ([place]) 11/28/2015    Class: History of  . Duodenal ulcer 11/28/2015    Class: Chronic  . High cholesterol 11/28/2015  . Dizziness 07/16/2015  . Incomplete RBBB 03/31/2015  . Sleep apnea, primary central 03/03/2015  . Polyneuropathy ([place]) 01/24/2013  . Right inguinal hernia 07/05/2012    Past Medical History:  Diagnosis Date  . Arthritis   . BCC (basal cell carcinoma)   . BPH (benign prostatic hypertrophy) 2008  . Cancer [facility]) 2008   Prostate  . Complete heart block ([place])   . Complex sleep apnea syndrome   . Diverticulosis   . ED (erectile dysfunction)   . GERD (gastroesophageal reflux disease)   . Hemorrhoids   . Hernia, inguinal   . Hyperlipidemia   . IFG (impaired fasting glucose)   . Osteopenia   . Peptic ulcer disease with hemorrhage 1993  . Shingles   . Vitamin D deficiency     Family History  Problem Relation Age of Onset  .  Heart failure Mother        Died age 67.  Apparently had valve surgery.   . Congenital heart disease Brother    Past Surgical History:  Procedure Laterality Date  . BACK SURGERY N/A 1984  . CATARACT EXTRACTION, BILATERAL    . HERNIA REPAIR    . LAMINECTOMY  1984   L 5  . PACEMAKER IMPLANT  12/05/2016   MDT Azure XT DR MRI conditional PPM implanted for CHB by Dr Vista Deck in [place]  . PACEMAKER INSERTION  12/05/2016  .  PROSTATE SURGERY  12/2006   Robotic  . TONSILLECTOMY  83 years old  . TRIGGER FINGER RELEASE  03/01/2012   Procedure: MINOR RELEASE TRIGGER FINGER/A-1 PULLEY;  Surgeon: Cammie Sickle., MD;  Location: [facility];  Service: Orthopedics;  Laterality: Left;  long finger   Social History   Social History Narrative   Right handed, Retired .Married, 2 kids. [place] none.   3 Step     Objective: Vital Signs: BP (!) 98/57 (BP Location: Left Arm, Patient Position: Sitting, Cuff Size: Normal)   Pulse 81   Resp 16   Ht 6\' 2"  (1.88 m)   Wt 164 lb 4.8 oz (74.5 kg)   BMI 21.09 kg/m    Physical Exam  Constitutional: He is oriented to person, place, and time. He appears well-developed and well-nourished.  HENT:  Head: Normocephalic and atraumatic.  Eyes: Pupils are equal, round, and reactive to light. Conjunctivae and EOM are normal.  Neck: Normal range of motion. Neck supple.  Cardiovascular: Normal rate, regular rhythm and normal heart sounds.  Pacemaker  Pulmonary/Chest: Effort normal and breath sounds normal.  Abdominal: Soft. Bowel sounds are normal.  Neurological: He is alert and oriented to person, place, and time.  Skin: Skin is warm and dry. Capillary refill takes less than 2 seconds.  Psychiatric: He has a normal mood and affect. His behavior is normal.  Nursing note and vitals reviewed.    Musculoskeletal Exam: C-spine limited range of motion.  Lumbar spine fairly good range of motion.  Shoulder joints elbow joints wrist joints were in good range of motion.  He has DIP and PIP thickening.  He has incomplete fist formation in his left hand due to postsurgical changes in his left middle finger and fourth trigger finger.  Hip joints, knee joints, ankles, MTPs and PIPs been good range of motion with no synovitis.  CDAI Exam: CDAI Score: 0.6  Patient Global Assessment: 3 (mm); Provider Global Assessment: 3 (mm) Swollen: 0 ; Tender: 0  Joint Exam   Not documented     There is currently no information documented on the homunculus. Go to the Rheumatology activity and complete the homunculus joint exam.  Investigation: No additional findings.  Imaging: No results found.  Recent Labs: Lab Results  Component Value Date   WBC 4.9 01/26/2018   HGB 14.4 01/26/2018   PLT 233 01/26/2018   NA 139 01/26/2018   K 4.8 01/26/2018   CL 101 01/26/2018   CO2 29 01/26/2018   GLUCOSE 119 (H) 01/26/2018   BUN 15 01/26/2018   CREATININE 0.67 (L) 01/26/2018   BILITOT 0.8 01/26/2018   ALKPHOS 60 10/08/2016   AST 7 (L) 01/26/2018   ALT 11 01/26/2018   PROT 6.7 01/26/2018   ALBUMIN 4.4 10/08/2016   CALCIUM 9.5 01/26/2018   GFRAA 104 01/26/2018    Speciality Comments: PLQ Eye Exam: 5/9/19WNL @ [place]  Procedures:  No  procedures performed Allergies: Sulfasalazine; Demerol [meperidine]; Garlic; Other; and Sulfa antibiotics   Assessment / Plan:     Visit Diagnoses: Rheumatoid arthritis of multiple sites without rheumatoid factor (HCC) - RF negative, CCP negative, 1433 eta negative, ANA negative.  Patient has no synovitis on examination.  He is doing well on combination of Plaquenil and methotrexate.  He is interested in getting subcu methotrexate.  He reports he has intermittent flares and would like to see if subcu methotrexate will be more effective.  We will apply for subcu methotrexate.  High risk medication use - PLQ 200 mg po bid, MTX 6 tabs po q wk, folic acid 1 mg po qd.  His labs are stable.  We will continue to monitor labs every 3 months.  Eye exam: 08/17/2017  PMR (polymyalgia rheumatica) (HCC)-in remission he has been clinically doing well.  Primary osteoarthritis of both hands-he has some some ongoing discomfort in his hands.  He also has left fourth trigger finger for which she has been getting surgery.  DDD (degenerative disc disease), cervical-he has limited range of motion but not much discomfort.  DDD (degenerative disc disease),  lumbar-doing well.  DDD (degenerative disc disease), thoracic-he continues to have some thoracic pain.  Age-related osteoporosis without current pathological fracture - DEXA 03/23/2017 T-score -1.9, DEXA shows lowest T score -2.0 on 03/18/15.  His DEXA is in the osteopenia range. s/p fosamax.   Other fatigue-he continues to have some fatigue.  Other medical problems are listed as follows:  History of prostate cancer  History of diabetes mellitus  Polyneuropathy  History of sleep apnea  History of vitamin D deficiency  Heart block AV complete ([place]) - s/p Pacemaker placement. Followed up by Dr. Rayann Heman now.   Orders: No orders of the defined types were placed in this encounter.  No orders of the defined types were placed in this encounter.   Face-to-face time spent with patient was 30 minutes. Greater than 50% of time was spent in counseling and coordination of care.  Follow-Up Instructions: Return in about 6 months (around 08/03/2018) for Rheumatoid arthritis, Polymyalgia rheumatica, Osteoarthritis.   Bo Merino, MD  Note - This record has been created using Editor, commissioning.  Chart creation errors have been sought, but may not always  have been located. Such creation errors do not reflect on  the standard of medical care.

## 2018-01-23 ENCOUNTER — Ambulatory Visit: Payer: Medicare Other | Admitting: Rheumatology

## 2018-01-26 ENCOUNTER — Other Ambulatory Visit: Payer: Self-pay

## 2018-01-26 DIAGNOSIS — Z79899 Other long term (current) drug therapy: Secondary | ICD-10-CM

## 2018-01-26 LAB — COMPLETE METABOLIC PANEL WITH GFR
AG Ratio: 1.8 (calc) (ref 1.0–2.5)
ALBUMIN MSPROF: 4.3 g/dL (ref 3.6–5.1)
ALKALINE PHOSPHATASE (APISO): 58 U/L (ref 40–115)
ALT: 11 U/L (ref 9–46)
AST: 7 U/L — ABNORMAL LOW (ref 10–35)
BILIRUBIN TOTAL: 0.8 mg/dL (ref 0.2–1.2)
BUN / CREAT RATIO: 22 (calc) (ref 6–22)
BUN: 15 mg/dL (ref 7–25)
CHLORIDE: 101 mmol/L (ref 98–110)
CO2: 29 mmol/L (ref 20–32)
Calcium: 9.5 mg/dL (ref 8.6–10.3)
Creat: 0.67 mg/dL — ABNORMAL LOW (ref 0.70–1.11)
GFR, Est African American: 104 mL/min/{1.73_m2} (ref >=60)
GFR, Est Non African American: 90 mL/min/{1.73_m2} (ref >=60)
GLUCOSE: 119 mg/dL — AB (ref 65–99)
Globulin: 2.4 g/dL (calc) (ref 1.9–3.7)
Potassium: 4.8 mmol/L (ref 3.5–5.3)
Sodium: 139 mmol/L (ref 135–146)
Total Protein: 6.7 g/dL (ref 6.1–8.1)

## 2018-01-26 LAB — CBC WITH DIFFERENTIAL/PLATELET
BASOS PCT: 0.2 %
Basophils Absolute: 10 cells/uL (ref 0–200)
Eosinophils Absolute: 113 cells/uL (ref 15–500)
Eosinophils Relative: 2.3 %
HEMATOCRIT: 42.4 % (ref 38.5–50.0)
Hemoglobin: 14.4 g/dL (ref 13.2–17.1)
LYMPHS ABS: 1524 {cells}/uL (ref 850–3900)
MCH: 31.6 pg (ref 27.0–33.0)
MCHC: 34 g/dL (ref 32.0–36.0)
MCV: 93.2 fL (ref 80.0–100.0)
MPV: 9.8 fL (ref 7.5–12.5)
Monocytes Relative: 11.3 %
NEUTROS PCT: 55.1 %
Neutro Abs: 2700 cells/uL (ref 1500–7800)
Platelets: 233 10*3/uL (ref 140–400)
RBC: 4.55 10*6/uL (ref 4.20–5.80)
RDW: 12.3 % (ref 11.0–15.0)
Total Lymphocyte: 31.1 %
WBC mixed population: 554 cells/uL (ref 200–950)
WBC: 4.9 10*3/uL (ref 3.8–10.8)

## 2018-01-29 NOTE — Progress Notes (Signed)
WNLs

## 2018-02-01 ENCOUNTER — Ambulatory Visit (INDEPENDENT_AMBULATORY_CARE_PROVIDER_SITE_OTHER): Payer: Medicare Other | Admitting: Rheumatology

## 2018-02-01 ENCOUNTER — Encounter: Payer: Self-pay | Admitting: Rheumatology

## 2018-02-01 ENCOUNTER — Telehealth: Payer: Self-pay | Admitting: Pharmacy Technician

## 2018-02-01 VITALS — BP 98/57 | HR 81 | Resp 16 | Ht 74.0 in | Wt 164.3 lb

## 2018-02-01 DIAGNOSIS — G629 Polyneuropathy, unspecified: Secondary | ICD-10-CM

## 2018-02-01 DIAGNOSIS — Z79899 Other long term (current) drug therapy: Secondary | ICD-10-CM

## 2018-02-01 DIAGNOSIS — I442 Atrioventricular block, complete: Secondary | ICD-10-CM

## 2018-02-01 DIAGNOSIS — M5134 Other intervertebral disc degeneration, thoracic region: Secondary | ICD-10-CM

## 2018-02-01 DIAGNOSIS — M5136 Other intervertebral disc degeneration, lumbar region: Secondary | ICD-10-CM

## 2018-02-01 DIAGNOSIS — M19041 Primary osteoarthritis, right hand: Secondary | ICD-10-CM

## 2018-02-01 DIAGNOSIS — M810 Age-related osteoporosis without current pathological fracture: Secondary | ICD-10-CM

## 2018-02-01 DIAGNOSIS — M19042 Primary osteoarthritis, left hand: Secondary | ICD-10-CM

## 2018-02-01 DIAGNOSIS — Z8546 Personal history of malignant neoplasm of prostate: Secondary | ICD-10-CM

## 2018-02-01 DIAGNOSIS — R5383 Other fatigue: Secondary | ICD-10-CM

## 2018-02-01 DIAGNOSIS — Z8669 Personal history of other diseases of the nervous system and sense organs: Secondary | ICD-10-CM

## 2018-02-01 DIAGNOSIS — Z8639 Personal history of other endocrine, nutritional and metabolic disease: Secondary | ICD-10-CM

## 2018-02-01 DIAGNOSIS — M0609 Rheumatoid arthritis without rheumatoid factor, multiple sites: Secondary | ICD-10-CM

## 2018-02-01 DIAGNOSIS — M353 Polymyalgia rheumatica: Secondary | ICD-10-CM | POA: Diagnosis not present

## 2018-02-01 DIAGNOSIS — M5030 Other cervical disc degeneration, unspecified cervical region: Secondary | ICD-10-CM

## 2018-02-01 NOTE — Telephone Encounter (Signed)
Received a Prior Authorization request from [employer] for [employer]. Authorization has been submitted to patient's insurance via Cover My Meds. Will update once we receive a response.  1:34 PM Beatriz Chancellor, CPhT

## 2018-02-01 NOTE — Patient Instructions (Signed)
Standing Labs We placed an order today for your standing lab work.    Please come back and get your standing labs in January and evry 10months  We have open lab Monday through Friday from 8:30-11:30 AM and 1:30-4:00 PM  at the office of Dr. Bo Merino.   You may experience shorter wait times on Monday and Friday afternoons. The office is located at [address] [place], [place], [place] [address] No appointment is necessary.   Labs are drawn by [facility].  You may receive a bill from [facility] for your lab work. If you have any questions regarding directions or hours of operation,  please call [phone].   Just as a reminder please drink plenty of water prior to coming for your lab work. Thanks!

## 2018-02-02 ENCOUNTER — Encounter (HOSPITAL_BASED_OUTPATIENT_CLINIC_OR_DEPARTMENT_OTHER): Payer: Self-pay | Admitting: *Deleted

## 2018-02-02 ENCOUNTER — Other Ambulatory Visit: Payer: Self-pay

## 2018-02-02 MED ORDER — METHOTREXATE (PF) 15 MG/0.3ML ~~LOC~~ SOAJ: 15.0000 mg | SUBCUTANEOUS | 0 refills | Status: DC

## 2018-02-02 NOTE — Telephone Encounter (Signed)
Received a fax from [employer] and [place] [employer] [place] regarding a prior authorization for Rasuvo 15 mg / 0.3 mL. Authorization has been APPROVED from 02/01/18 to 02/02/2019.   Will send document to scan center.  Authorization #[id number] Phone #[phone]  Notified patient of approval.  Confirmed pharmacy which is [facility] on [address].  Informed patient that sometimes insurance dictates which pharmacy this medication can be filled at such as mail order or specialty.  Instructed patient to call if he has any trouble obtaining this medication from his local [facility] and if we need to send his prescription someplace else.  Patient verbalized understanding.  All questions encouraged and answered.

## 2018-02-02 NOTE — Addendum Note (Signed)
Addended by: Mariella Saa C on: 02/02/2018 01:54 PM   Modules accepted: Orders

## 2018-02-06 ENCOUNTER — Encounter (HOSPITAL_BASED_OUTPATIENT_CLINIC_OR_DEPARTMENT_OTHER)
Admission: RE | Admit: 2018-02-06 | Discharge: 2018-02-06 | Disposition: A | Discharge status: Home / Self Care | Payer: Medicare Other | Source: Ambulatory Visit | Attending: Orthopedic Surgery | Admitting: Orthopedic Surgery

## 2018-02-06 ENCOUNTER — Other Ambulatory Visit: Payer: Self-pay

## 2018-02-06 DIAGNOSIS — Z8546 Personal history of malignant neoplasm of prostate: Secondary | ICD-10-CM | POA: Diagnosis not present

## 2018-02-06 DIAGNOSIS — Z8261 Family history of arthritis: Secondary | ICD-10-CM | POA: Diagnosis not present

## 2018-02-06 DIAGNOSIS — Z9842 Cataract extraction status, left eye: Secondary | ICD-10-CM | POA: Diagnosis not present

## 2018-02-06 DIAGNOSIS — E785 Hyperlipidemia, unspecified: Secondary | ICD-10-CM | POA: Diagnosis not present

## 2018-02-06 DIAGNOSIS — Z8669 Personal history of other diseases of the nervous system and sense organs: Secondary | ICD-10-CM | POA: Diagnosis not present

## 2018-02-06 DIAGNOSIS — Z9841 Cataract extraction status, right eye: Secondary | ICD-10-CM | POA: Diagnosis not present

## 2018-02-06 DIAGNOSIS — Z888 Allergy status to other drugs, medicaments and biological substances status: Secondary | ICD-10-CM | POA: Diagnosis not present

## 2018-02-06 DIAGNOSIS — Z8711 Personal history of peptic ulcer disease: Secondary | ICD-10-CM | POA: Diagnosis not present

## 2018-02-06 DIAGNOSIS — Z85828 Personal history of other malignant neoplasm of skin: Secondary | ICD-10-CM | POA: Diagnosis not present

## 2018-02-06 DIAGNOSIS — G4739 Other sleep apnea: Secondary | ICD-10-CM | POA: Diagnosis not present

## 2018-02-06 DIAGNOSIS — N400 Enlarged prostate without lower urinary tract symptoms: Secondary | ICD-10-CM | POA: Diagnosis not present

## 2018-02-06 DIAGNOSIS — M8580 Other specified disorders of bone density and structure, unspecified site: Secondary | ICD-10-CM | POA: Diagnosis not present

## 2018-02-06 DIAGNOSIS — E559 Vitamin D deficiency, unspecified: Secondary | ICD-10-CM | POA: Diagnosis not present

## 2018-02-06 DIAGNOSIS — E119 Type 2 diabetes mellitus without complications: Secondary | ICD-10-CM | POA: Diagnosis not present

## 2018-02-06 DIAGNOSIS — Z885 Allergy status to narcotic agent status: Secondary | ICD-10-CM | POA: Diagnosis not present

## 2018-02-06 DIAGNOSIS — M65842 Other synovitis and tenosynovitis, left hand: Secondary | ICD-10-CM | POA: Diagnosis not present

## 2018-02-06 DIAGNOSIS — Z8249 Family history of ischemic heart disease and other diseases of the circulatory system: Secondary | ICD-10-CM | POA: Diagnosis not present

## 2018-02-06 DIAGNOSIS — K5790 Diverticulosis of intestine, part unspecified, without perforation or abscess without bleeding: Secondary | ICD-10-CM | POA: Diagnosis not present

## 2018-02-06 DIAGNOSIS — Z91018 Allergy to other foods: Secondary | ICD-10-CM | POA: Diagnosis not present

## 2018-02-06 DIAGNOSIS — K219 Gastro-esophageal reflux disease without esophagitis: Secondary | ICD-10-CM | POA: Diagnosis not present

## 2018-02-06 DIAGNOSIS — I442 Atrioventricular block, complete: Secondary | ICD-10-CM | POA: Diagnosis not present

## 2018-02-06 DIAGNOSIS — M65342 Trigger finger, left ring finger: Secondary | ICD-10-CM | POA: Diagnosis not present

## 2018-02-06 DIAGNOSIS — M1990 Unspecified osteoarthritis, unspecified site: Secondary | ICD-10-CM | POA: Diagnosis not present

## 2018-02-06 DIAGNOSIS — Z882 Allergy status to sulfonamides status: Secondary | ICD-10-CM | POA: Diagnosis not present

## 2018-02-06 NOTE — Progress Notes (Signed)
EKG reviewed with Dr. Ambrose Pancoast. OK to proceed with surgery as scheduled.

## 2018-02-08 ENCOUNTER — Encounter (HOSPITAL_BASED_OUTPATIENT_CLINIC_OR_DEPARTMENT_OTHER): Payer: Self-pay

## 2018-02-08 ENCOUNTER — Ambulatory Visit (HOSPITAL_BASED_OUTPATIENT_CLINIC_OR_DEPARTMENT_OTHER)
Admission: RE | Admit: 2018-02-08 | Discharge: 2018-02-08 | Disposition: A | Discharge status: Home / Self Care | Payer: Medicare Other | Source: Ambulatory Visit | Attending: Orthopedic Surgery | Admitting: Orthopedic Surgery

## 2018-02-08 ENCOUNTER — Ambulatory Visit (HOSPITAL_BASED_OUTPATIENT_CLINIC_OR_DEPARTMENT_OTHER): Payer: Medicare Other | Admitting: Anesthesiology

## 2018-02-08 ENCOUNTER — Other Ambulatory Visit: Payer: Self-pay

## 2018-02-08 ENCOUNTER — Encounter (HOSPITAL_BASED_OUTPATIENT_CLINIC_OR_DEPARTMENT_OTHER)
Admission: RE | Disposition: A | Discharge status: Home / Self Care | Payer: Self-pay | Source: Ambulatory Visit | Attending: Orthopedic Surgery

## 2018-02-08 DIAGNOSIS — Z885 Allergy status to narcotic agent status: Secondary | ICD-10-CM | POA: Insufficient documentation

## 2018-02-08 DIAGNOSIS — M8580 Other specified disorders of bone density and structure, unspecified site: Secondary | ICD-10-CM | POA: Insufficient documentation

## 2018-02-08 DIAGNOSIS — Z91018 Allergy to other foods: Secondary | ICD-10-CM | POA: Insufficient documentation

## 2018-02-08 DIAGNOSIS — Z8261 Family history of arthritis: Secondary | ICD-10-CM | POA: Diagnosis not present

## 2018-02-08 DIAGNOSIS — Z8711 Personal history of peptic ulcer disease: Secondary | ICD-10-CM | POA: Insufficient documentation

## 2018-02-08 DIAGNOSIS — M65342 Trigger finger, left ring finger: Secondary | ICD-10-CM | POA: Diagnosis not present

## 2018-02-08 DIAGNOSIS — M1990 Unspecified osteoarthritis, unspecified site: Secondary | ICD-10-CM | POA: Insufficient documentation

## 2018-02-08 DIAGNOSIS — Z8546 Personal history of malignant neoplasm of prostate: Secondary | ICD-10-CM | POA: Insufficient documentation

## 2018-02-08 DIAGNOSIS — Z85828 Personal history of other malignant neoplasm of skin: Secondary | ICD-10-CM | POA: Insufficient documentation

## 2018-02-08 DIAGNOSIS — Z9842 Cataract extraction status, left eye: Secondary | ICD-10-CM | POA: Insufficient documentation

## 2018-02-08 DIAGNOSIS — Z888 Allergy status to other drugs, medicaments and biological substances status: Secondary | ICD-10-CM | POA: Insufficient documentation

## 2018-02-08 DIAGNOSIS — G4739 Other sleep apnea: Secondary | ICD-10-CM | POA: Insufficient documentation

## 2018-02-08 DIAGNOSIS — I442 Atrioventricular block, complete: Secondary | ICD-10-CM | POA: Insufficient documentation

## 2018-02-08 DIAGNOSIS — Z950 Presence of cardiac pacemaker: Secondary | ICD-10-CM | POA: Insufficient documentation

## 2018-02-08 DIAGNOSIS — M65842 Other synovitis and tenosynovitis, left hand: Secondary | ICD-10-CM | POA: Insufficient documentation

## 2018-02-08 DIAGNOSIS — N400 Enlarged prostate without lower urinary tract symptoms: Secondary | ICD-10-CM | POA: Insufficient documentation

## 2018-02-08 DIAGNOSIS — Z882 Allergy status to sulfonamides status: Secondary | ICD-10-CM | POA: Insufficient documentation

## 2018-02-08 DIAGNOSIS — E559 Vitamin D deficiency, unspecified: Secondary | ICD-10-CM | POA: Insufficient documentation

## 2018-02-08 DIAGNOSIS — E785 Hyperlipidemia, unspecified: Secondary | ICD-10-CM | POA: Insufficient documentation

## 2018-02-08 DIAGNOSIS — E119 Type 2 diabetes mellitus without complications: Secondary | ICD-10-CM | POA: Insufficient documentation

## 2018-02-08 DIAGNOSIS — Z8249 Family history of ischemic heart disease and other diseases of the circulatory system: Secondary | ICD-10-CM | POA: Insufficient documentation

## 2018-02-08 DIAGNOSIS — Z8669 Personal history of other diseases of the nervous system and sense organs: Secondary | ICD-10-CM | POA: Insufficient documentation

## 2018-02-08 DIAGNOSIS — K5790 Diverticulosis of intestine, part unspecified, without perforation or abscess without bleeding: Secondary | ICD-10-CM | POA: Insufficient documentation

## 2018-02-08 DIAGNOSIS — K219 Gastro-esophageal reflux disease without esophagitis: Secondary | ICD-10-CM | POA: Insufficient documentation

## 2018-02-08 DIAGNOSIS — Z9841 Cataract extraction status, right eye: Secondary | ICD-10-CM | POA: Insufficient documentation

## 2018-02-08 HISTORY — PX: TRIGGER FINGER RELEASE: SHX641

## 2018-02-08 HISTORY — DX: Type 2 diabetes mellitus without complications: E11.9

## 2018-02-08 LAB — GLUCOSE, CAPILLARY
Glucose-Capillary: 121 mg/dL — ABNORMAL HIGH (ref 70–99)
Glucose-Capillary: 97 mg/dL (ref 70–99)

## 2018-02-08 SURGERY — RELEASE, A1 PULLEY, FOR TRIGGER FINGER
Anesthesia: Regional | Site: Finger | Laterality: Left

## 2018-02-08 MED ORDER — FENTANYL CITRATE (PF) 100 MCG/2ML IJ SOLN: 25.0000 ug | INTRAMUSCULAR | Status: DC | PRN

## 2018-02-08 MED ORDER — FENTANYL CITRATE (PF) 100 MCG/2ML IJ SOLN
INTRAMUSCULAR | Status: AC
  Filled 2018-02-08: qty 2

## 2018-02-08 MED ORDER — CEFAZOLIN SODIUM-DEXTROSE 2-4 GM/100ML-% IV SOLN
INTRAVENOUS | Status: AC
  Filled 2018-02-08: qty 100

## 2018-02-08 MED ORDER — LIDOCAINE HCL (PF) 0.5 % IJ SOLN
INTRAMUSCULAR | Status: DC | PRN
  Administered 2018-02-08: 30 mL via INTRAVENOUS

## 2018-02-08 MED ORDER — TRAMADOL HCL 50 MG PO TABS: 50.0000 mg | ORAL_TABLET | Freq: Four times a day (QID) | ORAL | 0 refills | Status: DC | PRN

## 2018-02-08 MED ORDER — METOCLOPRAMIDE HCL 5 MG/ML IJ SOLN: 10.0000 mg | Freq: Once | INTRAMUSCULAR | Status: DC | PRN

## 2018-02-08 MED ORDER — FENTANYL CITRATE (PF) 100 MCG/2ML IJ SOLN
50.0000 ug | INTRAMUSCULAR | Status: DC | PRN
  Administered 2018-02-08 (×2): 50 ug via INTRAVENOUS

## 2018-02-08 MED ORDER — BUPIVACAINE HCL (PF) 0.25 % IJ SOLN
INTRAMUSCULAR | Status: DC | PRN
  Administered 2018-02-08: 6 mL

## 2018-02-08 MED ORDER — LACTATED RINGERS IV SOLN
INTRAVENOUS | Status: DC
  Administered 2018-02-08: 08:00:00 via INTRAVENOUS

## 2018-02-08 MED ORDER — SCOPOLAMINE 1 MG/3DAYS TD PT72: 1.0000 | MEDICATED_PATCH | Freq: Once | TRANSDERMAL | Status: DC | PRN

## 2018-02-08 MED ORDER — PROPOFOL 10 MG/ML IV BOLUS
INTRAVENOUS | Status: DC | PRN
  Administered 2018-02-08: 10 mg via INTRAVENOUS

## 2018-02-08 MED ORDER — ONDANSETRON HCL 4 MG/2ML IJ SOLN
INTRAMUSCULAR | Status: AC
  Filled 2018-02-08: qty 2

## 2018-02-08 MED ORDER — PROPOFOL 500 MG/50ML IV EMUL
INTRAVENOUS | Status: DC | PRN
  Administered 2018-02-08: 25 ug/kg/min via INTRAVENOUS

## 2018-02-08 MED ORDER — CEFAZOLIN SODIUM-DEXTROSE 2-4 GM/100ML-% IV SOLN
2.0000 g | INTRAVENOUS | Status: AC
  Administered 2018-02-08: 2 g via INTRAVENOUS

## 2018-02-08 MED ORDER — MIDAZOLAM HCL 2 MG/2ML IJ SOLN: 1.0000 mg | INTRAMUSCULAR | Status: DC | PRN

## 2018-02-08 MED ORDER — CHLORHEXIDINE GLUCONATE 4 % EX LIQD: 60.0000 mL | Freq: Once | CUTANEOUS | Status: DC

## 2018-02-08 SURGICAL SUPPLY — 34 items
BANDAGE COBAN STERILE 2 (GAUZE/BANDAGES/DRESSINGS) ×3 IMPLANT
BLADE SURG 15 STRL LF DISP TIS (BLADE) ×1 IMPLANT
BLADE SURG 15 STRL SS (BLADE) ×2
BNDG ESMARK 4X9 LF (GAUZE/BANDAGES/DRESSINGS) IMPLANT
CHLORAPREP W/TINT 26ML (MISCELLANEOUS) ×3 IMPLANT
CORD BIPOLAR FORCEPS 12FT (ELECTRODE) IMPLANT
COVER BACK TABLE 60X90IN (DRAPES) ×3 IMPLANT
COVER MAYO STAND STRL (DRAPES) ×3 IMPLANT
COVER WAND RF STERILE (DRAPES) IMPLANT
CUFF TOURNIQUET SINGLE 18IN (TOURNIQUET CUFF) ×3 IMPLANT
DECANTER SPIKE VIAL GLASS SM (MISCELLANEOUS) IMPLANT
DRAPE EXTREMITY T 121X128X90 (DRAPE) ×3 IMPLANT
DRAPE SURG 17X23 STRL (DRAPES) ×3 IMPLANT
GAUZE SPONGE 4X4 12PLY STRL (GAUZE/BANDAGES/DRESSINGS) ×3 IMPLANT
GAUZE XEROFORM 1X8 LF (GAUZE/BANDAGES/DRESSINGS) ×3 IMPLANT
GLOVE BIOGEL PI IND STRL 7.0 (GLOVE) ×1 IMPLANT
GLOVE BIOGEL PI IND STRL 8.5 (GLOVE) ×1 IMPLANT
GLOVE BIOGEL PI INDICATOR 7.0 (GLOVE) ×2
GLOVE BIOGEL PI INDICATOR 8.5 (GLOVE) ×2
GLOVE SURG ORTHO 8.0 STRL STRW (GLOVE) ×3 IMPLANT
GLOVE SURG SYN 7.5  E (GLOVE) ×2
GLOVE SURG SYN 7.5 E (GLOVE) ×1 IMPLANT
GOWN STRL REUS W/ TWL LRG LVL3 (GOWN DISPOSABLE) ×1 IMPLANT
GOWN STRL REUS W/TWL LRG LVL3 (GOWN DISPOSABLE) ×2
GOWN STRL REUS W/TWL XL LVL3 (GOWN DISPOSABLE) ×3 IMPLANT
NEEDLE PRECISIONGLIDE 27X1.5 (NEEDLE) ×3 IMPLANT
NS IRRIG 1000ML POUR BTL (IV SOLUTION) ×3 IMPLANT
PACK BASIN DAY SURGERY FS (CUSTOM PROCEDURE TRAY) ×3 IMPLANT
STOCKINETTE 4X48 STRL (DRAPES) ×3 IMPLANT
SUT ETHILON 4 0 PS 2 18 (SUTURE) ×3 IMPLANT
SYR BULB 3OZ (MISCELLANEOUS) ×3 IMPLANT
SYR CONTROL 10ML LL (SYRINGE) ×3 IMPLANT
TOWEL GREEN STERILE FF (TOWEL DISPOSABLE) ×6 IMPLANT
UNDERPAD 30X30 (UNDERPADS AND DIAPERS) ×3 IMPLANT

## 2018-02-08 NOTE — Brief Op Note (Signed)
02/08/2018  9:07 AM  PATIENT:  Jeffery [name]  83 y.o. male  PRE-OPERATIVE DIAGNOSIS:  TRIGGER LEFT RING FINGER  POST-OPERATIVE DIAGNOSIS:  TRIGGER LEFT RING FINGER  PROCEDURE:  Procedure(s): RELEASE TRIGGER FINGER/A-1 PULLEY LEFT RING FINGER (Left)  SURGEON:  Surgeon(s) and Role:    * Daryll Brod, MD - Primary  PHYSICIAN ASSISTANT:   ASSISTANTS: none   ANESTHESIA:   local, regional and IV sedation  EBL: 60ml  BLOOD ADMINISTERED:none  DRAINS: none   LOCAL MEDICATIONS USED:  MARCAINE     SPECIMEN:  No Specimen  DISPOSITION OF SPECIMEN:  N/A  COUNTS:  YES  TOURNIQUET:   Total Tourniquet Time Documented: Forearm (Left) - 16 minutes Total: Forearm (Left) - 16 minutes   DICTATION: .Dragon Dictation  PLAN OF CARE: Discharge to home after PACU  PATIENT DISPOSITION:  PACU - hemodynamically stable.

## 2018-02-08 NOTE — H&P (Signed)
Jeffery [name] is an 83 y.o. male.   Chief Complaint:catching left ring fingerHPI: Jeffery [name] is an 83 year old right-hand-dominant male who has  has had a 2-year history of catching of his left ring finger. He has had 2 injections by Dr. Marjory Lies for this. This has not resolved it for him. He has had a trigger finger release done by Dr.Sypher for this 8-10 years ago. He complains of mild discomfort. He states he is reluctant to make a fist with it because it catches. He has no history of injury. States nothing makes it better or worse. He is a borderline diabetic no history of thyroid problems he does have a history of arthritis no history of gout. Family history is positive for arthritis negative for diabetes thyroid problems and gout.   Past Medical History:  Diagnosis Date  . Arthritis   . BCC (basal cell carcinoma)   . BPH (benign prostatic hypertrophy) 2008  . Cancer [facility]) 2008   Prostate  . Complete heart block ([place])   . Complex sleep apnea syndrome    does not use CPAP  . Diabetes mellitus without complication ([place])   . Diverticulosis   . ED (erectile dysfunction)   . GERD (gastroesophageal reflux disease)   . Hemorrhoids   . Hernia, inguinal   . Hyperlipidemia   . IFG (impaired fasting glucose)   . Osteopenia   . Peptic ulcer disease with hemorrhage 1993  . Shingles   . Vitamin D deficiency     Past Surgical History:  Procedure Laterality Date  . BACK SURGERY N/A 1984  . CATARACT EXTRACTION, BILATERAL    . HERNIA REPAIR    . LAMINECTOMY  1984   L 5  . PACEMAKER IMPLANT  12/05/2016   MDT Azure XT DR MRI conditional PPM implanted for CHB by Dr Vista Deck in [place]  . PACEMAKER INSERTION  12/05/2016  . PROSTATE SURGERY  12/2006   Robotic  . TONSILLECTOMY  83 years old  . TRIGGER FINGER RELEASE  03/01/2012   Procedure: MINOR RELEASE TRIGGER FINGER/A-1 PULLEY;  Surgeon: Cammie Sickle., MD;  Location: [facility];  Service: Orthopedics;  Laterality: Left;   long finger    Family History  Problem Relation Age of Onset  . Heart failure Mother        Died age 47.  Apparently had valve surgery.   . Congenital heart disease Brother    Social History:  reports that he has never smoked. He has never used smokeless tobacco. He reports that he drank alcohol. He reports that he does not use drugs.  Allergies:  Allergies  Allergen Reactions  . Sulfasalazine Anaphylaxis  . Demerol [Meperidine] Nausea Only  . Garlic   . Other     shallots  . Sulfa Antibiotics     No medications prior to admission.    No results found for this or any previous visit (from the past 48 hour(s)).  No results found.   Pertinent items are noted in HPI.  Height 6\' 2"  (1.88 m), weight 74.8 kg.  General appearance: cooperative and appears stated age Head: Normocephalic, without obvious abnormality Neck: no JVD Resp: clear to auscultation bilaterally Cardio: regular rate and rhythm, S1, S2 normal, no murmur, click, rub or gallop GI: soft, non-tender; bowel sounds normal; no masses,  no organomegaly Extremities: catching left ring finger Pulses: 2+ and symmetric Skin: Skin color, texture, turgor normal. No rashes or lesions Neurologic: Grossly normal Incision/Wound: na  Assessment/Plan Assessment:  1. Trigger ring finger of left hand    Plan: We have discussed possibility of surgical surgical release of the A1 pulley with him. Pre-peri-and postoperative course been discussed along with risks and complications. He is aware there is no guarantee to the surgery the possibility of infection recurrence injury to arteries nerves tendons complete relief symptoms and dystrophy. Scheduled for release A1 pulleys left ring finger as an outpatient under regional anesthesia.    Daryll Brod 02/08/2018, 5:03 AM

## 2018-02-08 NOTE — Progress Notes (Signed)
Wife of pt stated that she had not received any printed instructions, but someone went over them. I printed instructions, went over instructions with pt in car. Pt said he needed CBG before he left, I got CBG machine, sat pt in lobby to to do CBG.

## 2018-02-08 NOTE — Anesthesia Procedure Notes (Signed)
Anesthesia Regional Block: Bier block (IV Regional)   Pre-Anesthetic Checklist: ,, timeout performed, Correct Patient, Correct Site, Correct Laterality, Correct Procedure,, site marked, surgical consent,, at surgeon's request  Laterality: Left     Needles:  Injection technique: Single-shot  Needle Type: Other      Needle Gauge: 20     Additional Needles:   Procedures:,,,,, intact distal pulses, Esmarch exsanguination, single tourniquet utilized,  Narrative:  Start time: 02/08/2018 9:04 AM End time: 02/08/2018 8:50 AM  Performed by: Personally

## 2018-02-08 NOTE — Discharge Instructions (Addendum)

## 2018-02-08 NOTE — Transfer of Care (Signed)
Immediate Anesthesia Transfer of Care Note  Patient: Jeffery [name]  Procedure(s) Performed: RELEASE TRIGGER FINGER/A-1 PULLEY LEFT RING FINGER (Left Finger)  Patient Location: PACU  Anesthesia Type:MAC and Bier block  Level of Consciousness: awake, alert  and oriented  Airway & Oxygen Therapy: Patient Spontanous Breathing and Patient connected to face mask oxygen  Post-op Assessment: Report given to RN and Post -op Vital signs reviewed and stable  Post vital signs: Reviewed and stable  Last Vitals:  Vitals Value Taken Time  BP 110/59 02/08/2018  9:11 AM  Temp    Pulse 69 02/08/2018  9:14 AM  Resp 16 02/08/2018  9:14 AM  SpO2 98 % 02/08/2018  9:14 AM  Vitals shown include unvalidated device data.  Last Pain:  Vitals:   02/08/18 0748  TempSrc: Oral  PainSc: 0-No pain      Patients Stated Pain Goal: 3 (18/84/16 6063)  Complications: No apparent anesthesia complications

## 2018-02-08 NOTE — Anesthesia Preprocedure Evaluation (Addendum)
Anesthesia Evaluation  Patient identified by MRN, date of birth, ID band Patient awake    Reviewed: Allergy & Precautions, NPO status , Patient's Chart, lab work & pertinent test results  Airway Mallampati: II  TM Distance: >3 FB Neck ROM: Full    Dental no notable dental hx.    Pulmonary sleep apnea ,    Pulmonary exam normal breath sounds clear to auscultation       Cardiovascular Normal cardiovascular exam+ pacemaker  Rhythm:Regular Rate:Normal     Neuro/Psych negative neurological ROS  negative psych ROS   GI/Hepatic negative GI ROS, Neg liver ROS, neg GERD  ,  Endo/Other  negative endocrine ROSdiabetes  Renal/GU negative Renal ROS  negative genitourinary   Musculoskeletal negative musculoskeletal ROS (+)   Abdominal   Peds negative pediatric ROS (+)  Hematology negative hematology ROS (+)   Anesthesia Other Findings   Reproductive/Obstetrics negative OB ROS                            Anesthesia Physical Anesthesia Plan  ASA: III  Anesthesia Plan: Bier Block and Bier Block-LIDOCAINE ONLY   Post-op Pain Management:    Induction:   PONV Risk Score and Plan: 1 and Treatment may vary due to age or medical condition  Airway Management Planned: Nasal Cannula and Simple Face Mask  Additional Equipment:   Intra-op Plan:   Post-operative Plan:   Informed Consent: I have reviewed the patients History and Physical, chart, labs and discussed the procedure including the risks, benefits and alternatives for the proposed anesthesia with the patient or authorized representative who has indicated his/her understanding and acceptance.   Dental advisory given  Plan Discussed with: CRNA  Anesthesia Plan Comments:         Anesthesia Quick Evaluation

## 2018-02-08 NOTE — Op Note (Signed)
NAME: Chavez Rosol MEDICAL RECORD NO: [id number] DATE OF BIRTH: Apr 04, 1935 FACILITY[facility] LOCATION: [facility] PHYSICIAN: Wynonia Sours, MD   OPERATIVE REPORT   DATE OF PROCEDURE: 02/08/18    PREOPERATIVE DIAGNOSIS:   Stenosing tenosynovitis left ring finger   POSTOPERATIVE DIAGNOSIS:   Same   PROCEDURE:   Release A1 pulley left ring finger   SURGEON: Daryll Brod, M.D.   ASSISTANT: none   ANESTHESIA:  Bier block with sedation and Local   INTRAVENOUS FLUIDS:  Per anesthesia flow sheet.   ESTIMATED BLOOD LOSS:  Minimal.   COMPLICATIONS:  None.   SPECIMENS:  none   TOURNIQUET TIME:    Total Tourniquet Time Documented: Forearm (Left) - 16 minutes Total: Forearm (Left) - 16 minutes    DISPOSITION:  Stable to PACU.   INDICATIONS: Patient is a an 83 year old male with a long history of triggering of the left ring finger.  This is not responded to conservative treatment he is like to undergo surgical release of the A1 pulley.  Pre-peri-and postoperative course been discussed along with risks and complications.  He is aware that there is no guarantee to the surgery the possibility of infection recurrence injury to arteries nerves tendons and complete relief symptoms and dystrophy.  In the preoperative area the patient is seen extremity marked by both patient and surgeon antibiotic given  OPERATIVE COURSE: Patient brought to the operating room where a forearm-based IV regional anesthetic was carried out without difficulty under the direction the anesthesia department.  Was prepped using ChloraPrep in the supine position with left arm free.  A three-minute dry time was allowed and timeout taken to confirm patient procedure.  An oblique incision was made over the A1 pulley of the left ring finger.  This carried down through subcutaneous tissue.  The neurovascular bundles were retracted radially and ulnarly.  The A1 pulley was found to be markedly thickened this  was released on its radial aspect.  A small incision was made centrally and A2.  Tenosynovial tissue proximally was separated with blunt dissection tear.  The 2 tendons were then separated to break any adhesions between the tenosynovial tissue.  The finger placed through full range of motion no further triggering was noted.  He did have stiffness of both PIP DIP joints noted.  Her springy in nature.  The wound was copiously irrigated with saline.  The skin was closed with interrupted 4 nylon sutures.  A local infiltration quarter percent bupivacaine without epinephrine was given approximately 5 cc was used.  A sterile compressive dressing with the fingers free was applied.  Inflation of the tourniquet all fingers immediately pink.  He was taken to the recovery room for observation in satisfactory condition.  He will be discharged home to return [facility] in 1 week on Ultram.  He will try Tylenol ibuprofen first but will have the Ultram for breakthrough.   Daryll Brod, MD Electronically signed, 02/08/18

## 2018-02-08 NOTE — Anesthesia Postprocedure Evaluation (Signed)
Anesthesia Post Note  Patient: Jeffery [name]  Procedure(s) Performed: RELEASE TRIGGER FINGER/A-1 PULLEY LEFT RING FINGER (Left Finger)     Patient location during evaluation: PACU Anesthesia Type: Bier Block Level of consciousness: awake and alert Pain management: pain level controlled Vital Signs Assessment: post-procedure vital signs reviewed and stable Respiratory status: spontaneous breathing, nonlabored ventilation, respiratory function stable and patient connected to nasal cannula oxygen Cardiovascular status: stable and blood pressure returned to baseline Postop Assessment: no apparent nausea or vomiting Anesthetic complications: no    Last Vitals:  Vitals:   02/08/18 0911 02/08/18 0945  BP:  120/75  Pulse: 68 65  Resp: 15 18  Temp: 36.6 C 36.7 C  SpO2: 100% 97%    Last Pain:  Vitals:   02/08/18 0945  TempSrc:   PainSc: 0-No pain                 Montez Hageman

## 2018-02-09 ENCOUNTER — Encounter (HOSPITAL_BASED_OUTPATIENT_CLINIC_OR_DEPARTMENT_OTHER): Payer: Self-pay | Admitting: Orthopedic Surgery

## 2018-03-12 ENCOUNTER — Telehealth: Payer: Self-pay | Admitting: Neurology

## 2018-03-12 DIAGNOSIS — R001 Bradycardia, unspecified: Secondary | ICD-10-CM

## 2018-03-12 DIAGNOSIS — G4731 Primary central sleep apnea: Secondary | ICD-10-CM

## 2018-03-12 DIAGNOSIS — Z950 Presence of cardiac pacemaker: Secondary | ICD-10-CM

## 2018-03-12 NOTE — Telephone Encounter (Signed)
Pt states according to Dr Zadie Rhine he is in need of a CPAP immediatly,pt is asking for a call from Dr Brett Fairy

## 2018-03-13 NOTE — Telephone Encounter (Signed)
Called the pt and made him aware that I discussed his call and message with Dr Brett Fairy. Pt advised that he recently had a visit with eye MD who encouraged the patient to see Dr Zadie Rhine. Dr Zadie Rhine immediately did study and evaluated the patient and asked the patient if he had sleep apnea. He was diagnosed with Telantiectasis and was informed this was typically seen with patients who have sleep apnea. He strongly encouraged the patient to start therapy. Pt is scheduled for ov tomorrow to order the studies at which once the studies are completed he is agreeable to starting treatment based off what is recommended. Pt is leaving out of town for his annual trip on Jan 15,2020 and is hoping that we can started with treatment. I have placed the order for split night for the patient so that we can hopefully start working on insurance part and after the patient's apt tomorrow he can stop by and get scheduled in the sleep lab for the study. Patient had a bad experience several years ago in our sleep lab and so he just wants to make sure he is scheduled with someone who is very professional knowledgeable. Pt states Dr Brett Fairy is aware of the concerns that were present several years ago and he knows that she addressed the issue at that time but just wanted to make sure we kept that in mind when scheduling him. Pt was appreciative for working him in so soon.

## 2018-03-13 NOTE — Telephone Encounter (Signed)
So he had a HST with dr. Zadie Rhine? Or in a lab ? Please let him know that we are happy to take care of sleep studies , but he needs to have them her !!

## 2018-03-13 NOTE — Telephone Encounter (Signed)
What is this need based on ? He has central apnea, has not been treated with PAP in the past. We would need to see him face to face and order a sleep study to confirm the degree and type of apnea and determination of treatment.    Larey Seat, MD

## 2018-03-13 NOTE — Addendum Note (Signed)
Addended by: Darleen Crocker on: 03/13/2018 03:17 PM   Modules accepted: Orders

## 2018-03-14 ENCOUNTER — Ambulatory Visit (INDEPENDENT_AMBULATORY_CARE_PROVIDER_SITE_OTHER): Payer: Medicare Other | Admitting: Neurology

## 2018-03-14 ENCOUNTER — Other Ambulatory Visit: Payer: Self-pay

## 2018-03-14 ENCOUNTER — Encounter: Payer: Self-pay | Admitting: Neurology

## 2018-03-14 VITALS — BP 111/60 | HR 102 | Ht 74.0 in | Wt 166.0 lb

## 2018-03-14 DIAGNOSIS — G629 Polyneuropathy, unspecified: Secondary | ICD-10-CM | POA: Diagnosis not present

## 2018-03-14 DIAGNOSIS — Z950 Presence of cardiac pacemaker: Secondary | ICD-10-CM

## 2018-03-14 DIAGNOSIS — R001 Bradycardia, unspecified: Secondary | ICD-10-CM | POA: Diagnosis not present

## 2018-03-14 DIAGNOSIS — G4731 Primary central sleep apnea: Secondary | ICD-10-CM

## 2018-03-14 DIAGNOSIS — R209 Unspecified disturbances of skin sensation: Secondary | ICD-10-CM

## 2018-03-14 DIAGNOSIS — T378X5S Adverse effect of other specified systemic anti-infectives and antiparasitics, sequela: Secondary | ICD-10-CM

## 2018-03-14 NOTE — Progress Notes (Signed)
[name]  Provider:  Larey Seat, MD.   Referring Provider: Shon Baton, MD Primary Care Physician:  Shon Baton, MD  Chief Complaint  Patient presents with  . Follow-up    RM 10, alone. Here to discuss treatment for sleep apnea. Medical records pulled old sleep studies to review from centricity.     HPI: [name], 83,  presents after he had an extensive visit with his ophthalmologist and retinal specialist , Dr. Zadie Rhine, Whom he sees for Plaquenil follow up. 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 Zadie Rhine asked the patient if he snores which she confirmed.  He also stated that treatment of obstructive sleep apnea may within a week resolve or alleviate the Tele-angiectasis.  Aside from the concern of snoring, there is also excessive daytime sleepiness loaders now the patient endorsed the Epworth Sleepiness Scale at 16 out of 24 possible points, but fatigue severity was only endorsed at 39 points.The patient's body mass index has not increased, but decreased. He remains with a deviated septum.  Jeffery [name] had been seen in 2011-sleep study obtained on 24 Aug 2009 confirmed an AHI also known as apnea hypopnea index of 27.7/h of sleep, equal to almost 1 respiratory event every 2 minutes.  Supine there was much accentuation to a supine AHI of 75.6/h, there was only slight accentuation in rem sleep.  Interesting is that the patient had 8 obstructive apneas alongside 124 central and 16 mixed apneas.  There were very few hypopneas.  Central apnea however is not always responsive to CPAP.  He returned for a CPAP and BiPAP titration on 14 September 2009 and did not tolerate or respond well to pressures between 4 and 8 cmH2O CPAP pressure.  Subsequently BiPAP was initiated he tried several masks settling on a full facemask at the most is the most  acceptable.  There was no optimal pressure on BiPAP found either. He just couldn't sleep. Dr Lonzo Cloud interpreted and recommended 7 cm CPAP as the best solution.   Jeffery [name] is seen on 23 February 2017 and a follow-up visit after recent cervical spine MRI was obtained.  In the meantime he has received a pacemaker, and is followed by Dr. Rayann Heman through [facility].  Symptoms occurred in [place], [place] where he was diagnosed and his care was transferred here, he received a temporary pacemaker in [employer] .  Treatment for severe bradycardia. diaphoretic and with palpitation, bowel incontinence.  In the interim he had on 11-28-2016 an MRI - he called Dr. Sherwood Gambler and has not made a follow up with him. Post-pacemaker implantation he had left arm and wrist pain, was treated with prednisone and has not had symptoms in neck and hands since! Marland Kitchen   08-21-2017, well adjusted to his pacemaker, no complications. No syncope, remains numb behind the ear. We will follow his complex apnea, neuropathy as needed.  No concerns of sleep , no pain.    11-01-2016. Jeffery [name] is a 83 y.o. male seen here as a revisit  from Dr. Virgina Jock for a newly noted area of dysesthesia behind the left ear.  There is no numbness- the skin sensation is intermittently disturbed, tingly, like a fine touch. I evaluated the area and it does not quite fit a dermatomal distribution-Area is retroauricular and would be innervated by C2. There is no spasm.   This  patient of Dr. Virgina Jock Dr. Estanislado Pandy has followed up since May 2011. Initially he was referred for a polysomnogram based on chronic fatigue complaints and was diagnosed as complex apnea.  The apnea short-billed complements central and obstructive, but in a distribution that was more typical for a with apnea.  They were neuropathy related labs sent out and 2011 as well as also to address chronic fatigue and all returned to normal range. The patient clearly has an accentuated AHI by  position and during REM sleep. He has been healthy and maintains an active lifestyle is traveling is a nonsmoker nondrinker and has nausea history. The patient responded well to Wellbutrin on only 100 mg daily taken to eliminate or reduce central apneas. During his last visit in 2013 his fatigue score was 20 and his Epworth score 14 points and he had reported one nocturia on average per night. He estimated his daily sleep time is 7 hours or more. He sometimes naps in the afternoons. This is still the case today:  his Epworth is 11 points, and fatigue score is 24. I would like to add that the patient is on chronic Plaquenil therapy. This was initiated after he developed severe muscle stiffness and relief this is the symptoms within 2-3 weeks. 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 Plaquenil  may contribute somewhat to the development of the neuropathy. Wellbutrin has had some positive effect on the level of alertness and daytime and may also be due discomfort.  03-03-15 Jeffery [name] reports that he had taken a drug holiday and for about 6 months did not use Plaquenil. As the responds he noted again stiffness in his hands and inflammation. Dr. Estanislado Pandy , his rheumatologist, had placed him on his prednisone taper but then Plaquenil was reinitiated about 3 months ago. I would like to add that the stiffness has never affected his lower extremities or feet and has always been manifested in his hands. He also has 3 trigger fingers. Fatigue is stable. His Epworth score is endorsed at 9 points and his fatigue severity at 34 points. The geriatric depression score was  endorsed at 2 points.  We are seeing today after Jeffery [name] suffered a sudden fall with a laceration of the left for head and fracture of the left thumb. He was walking briskly but on an uneven surface and initially believed that he tripped and fell.  But he had as sudden premonition to slow down,  before he fell which tells me that he most likely fainted. He has no history of seizures, he was able to break his fall but had no focal deficits [name] is a fall and also did not feel that he had problems was generating words, but his thought process was scrambled or that he had any focal motor deficits. This rules out a TIA.  Once he was on the ground he was immediately conscious and aware of his surroundings- he was not combative and he will actually waited patiently for EMS to arrive. The ED reports were reviewed here with him. A cardiac workup was initiated and his transthoracic echo cardiogram revealed no abnormalities.  He also underwent carotid Doppler studies which showed minor in my opinion trivial calcifications at the bifurcation. These would be in the range between 1 and 40% the nonsurgical indication range. I would like for the patient to take a baby aspirin is possibly enteric coated once a day should he develop bruising  I will like him to take it every other day. I also discussed with him that we have never established at baseline for his neuropathy. I would like for him as early as available in the new year to undergo an EMG and nerve conduction study to evaluate the degree of peripheral neuropathy and axonal versus demyelinating quality of the neuropathy. The patient is interested. A recent comprehensive metabolic panel was Dr. Franchot Gallo was interpreted as normal , as well as a CBC diff  which she has every 3 months.   Review of Systems: Out of a complete 14 system review, the patient complains of only the following symptoms, and all other reviewed systems are negative. Intermittent fatigue. Fatigue is stable. His Epworth score is endorsed at 7 points and his fatigue severity at 27 points.  The geriatric depression score was  endorsed at 2 points. night blindness. Dysesthesias resolved under steroids, bradycardia on pacemaker.    Eye clinic  at [facility] - Plaquenil (Hydroxychloroquine) Screening  I, the attending physician, performed the HPI with the patient and updated the documentation appropriately. The patient is here for a return visit. The patient is here today for Plaquenil (Hydroxychloroquine) screening. Patient has Rheumatoid Arthritis. The patient has been taking Plaquenil (Hydroxychloroquine) for more than 5 years. The patient has been taking Plaquenil (Hydroxychloroquine) for approximately 7 years. The patient reports no changes in their vision. Known co-existing eye disease: macular degeneration. The patient does not request a new glasses prescription. Review of eye symptoms reveals: Negative for flashes, eye pain and floaters.  Last edited by Hyman Hopes, MD on 08/17/2017 3:35 PM. (History)     Social History   Socioeconomic History  . Marital status: Married    Spouse name: Not on file  . Number of children: 2  . Years of education: Not on file  . Highest education level: Not on file  Occupational History  . Not on file  Social Needs  . Financial resource strain: Not on file  . Food insecurity:    Worry: Not on file    Inability: Not on file  . Transportation needs:    Medical: Not on file    Non-medical: Not on file  Tobacco Use  . Smoking status: Never Smoker  . Smokeless tobacco: Never Used  Substance and Sexual Activity  . Alcohol use: Not Currently    Frequency: Never  . Drug use: No  . Sexual activity: Not on file  Lifestyle  . Physical activity:    Days per week: Not on file    Minutes per session: Not on file  . Stress: Not on file  Relationships  . Social connections:    Talks on phone: Not on file    Gets together: Not on file    Attends religious service: Not on file    Active member of club or organization: Not on file    Attends meetings of clubs or organizations: Not on file    Relationship status: Not on file  . Intimate partner violence:    Fear of current or ex  partner: Not on file    Emotionally abused: Not on file    Physically abused: Not on file    Forced sexual activity: Not on file  Other Topics Concern  . Not on file  Social History Narrative   Right handed, Retired .Married, 2 kids. Caffeine none.   3 Step     Family History  Problem Relation Age of Onset  .  Heart failure Mother        Died age 47.  Apparently had valve surgery.   . Congenital heart disease Brother     Past Medical History:  Diagnosis Date  . Arthritis   . BCC (basal cell carcinoma)   . BPH (benign prostatic hypertrophy) 2008  . Cancer [facility]) 2008   Prostate  . Complete heart block ([place])   . Complex sleep apnea syndrome    does not use CPAP  . Diabetes mellitus without complication ([place])   . Diverticulosis   . ED (erectile dysfunction)   . GERD (gastroesophageal reflux disease)   . Hemorrhoids   . Hernia, inguinal   . Hyperlipidemia   . IFG (impaired fasting glucose)   . Osteopenia   . Peptic ulcer disease with hemorrhage 1993  . Shingles   . Vitamin D deficiency     Past Surgical History:  Procedure Laterality Date  . BACK SURGERY N/A 1984  . CATARACT EXTRACTION, BILATERAL    . HERNIA REPAIR    . LAMINECTOMY  1984   L 5  . PACEMAKER IMPLANT  12/05/2016   MDT Azure XT DR MRI conditional PPM implanted for CHB by Dr Vista Deck in [place]  . PACEMAKER INSERTION  12/05/2016  . PROSTATE SURGERY  12/2006   Robotic  . TONSILLECTOMY  83 years old  . TRIGGER FINGER RELEASE  03/01/2012   Procedure: MINOR RELEASE TRIGGER FINGER/A-1 PULLEY;  Surgeon: Cammie Sickle., MD;  Location: [facility];  Service: Orthopedics;  Laterality: Left;  long finger  . TRIGGER FINGER RELEASE Left 02/08/2018   Procedure: RELEASE TRIGGER FINGER/A-1 PULLEY LEFT RING FINGER;  Surgeon: Daryll Brod, MD;  Location: [facility];  Service: Orthopedics;  Laterality: Left;    Current Outpatient Medications  Medication Sig Dispense Refill  . acetaminophen  (TYLENOL) 500 MG tablet Take 500 mg by mouth every 6 (six) hours as needed.    . Calcium Carbonate (CALCIUM 600 PO) Take 600 mg by mouth daily.    . Cholecalciferol (VITAMIN D) 2000 units CAPS Take by mouth daily.    . Coenzyme Q10 200 MG capsule Take 200 mg by mouth 2 (two) times daily.     . colesevelam (WELCHOL) 625 MG tablet Take 1,250 mg by mouth 3 (three) times daily.     . D3-50 50000 units capsule TAKE 1 CAPSULE BY MOUTH ONCE A MONTH 12 capsule 0  . diclofenac (FLECTOR) 1.3 % PTCH Place 1 patch onto the skin every 12 hours, as needed. 30 patch 1  . diclofenac sodium (VOLTAREN) 1 % GEL Apply 3 grams to 3 large joints, up to 3 times daily. (Patient taking differently: as needed. Apply 3 grams to 3 large joints, up to 3 times daily.) 3 Tube 3  . folic acid (FOLVITE) 1 MG tablet TAKE 1 TABLET BY MOUTH EVERY DAY 90 tablet 0  . hydroxychloroquine (PLAQUENIL) 200 MG tablet TAKE 1 TABLET BY MOUTH TWICE DAILY 180 tablet 0  . metFORMIN (GLUCOPHAGE) 500 MG tablet Take 500 mg daily by mouth.     . Methotrexate, PF, (RASUVO) 15 MG/0.3ML SOAJ Inject 15 mg into the skin once a week. 12 pen 0  . omega-3 acid ethyl esters (LOVAZA) 1 g capsule TAKE 1 CAPSULE BY MOUTH THREE TIMES A WEEK 36 capsule 0  . ONE TOUCH ULTRA TEST test strip TEST BLOOD SUGAR BID AS DIRECTED  2  . vitamin E 100 UNIT capsule Take 100 Units by mouth  daily.     No current facility-administered medications for this visit.     Allergies as of 03/14/2018 - Review Complete 03/14/2018  Allergen Reaction Noted  . Sulfasalazine Anaphylaxis 02/08/2016  . Demerol [meperidine] Nausea Only 03/01/2012  . Garlic  74/[id number]/4967  . Other  09/25/2017  . Sulfa antibiotics  02/23/2017    Vitals: BP 111/60 (BP Location: Left Arm, Patient Position: Sitting, Cuff Size: Normal)   Pulse (!) 102   Ht 6\' 2"  (1.88 m)   Wt 166 lb (75.3 kg)   SpO2 96%   BMI 21.31 kg/m  Last Weight:  Wt Readings from Last 1 Encounters:  03/14/18 166 lb (75.3 kg)    Last Height:   Ht Readings from Last 1 Encounters:  03/14/18 6\' 2"  (1.88 m)   DOB: 1934/07/30 MRN: [id number]   Physical exam: General: The patient is awake, alert and appears not in acute distress. The patient is well groomed. Head: Normocephalic, atraumatic. Neck is supple. Mallampati 2 , neck circumference: 15 inches.   Cardiovascular:  Regular rate and rhythm, without  murmurs -without distended neck veins. Respiratory: Lungs are clear to auscultation. Skin:  Without evidence of edema, or rash Trunk: BMI is normal. He lost weight by design.   Neurologic exam: The patient is awake and alert, oriented to place and time.   Memory subjective described as intact. There is a normal attention span & concentration ability.  Speech is fluent without dysarthria, dysphonia or aphasia.  Mood and affect are appropriate.  Cranial nerves: Pupils are equal and briskly reactive to light.  Funduscopic exam - quoted from Dr. Radene Ou notes.  Extraocular movements  in vertical and horizontal planes intact and without nystagmus. Visual fields by finger perimetry are intact.Hearing to finger rub intact.Facial sensation intact to fine touch.  Facial motor strength is symmetric , his  tongue and uvula move midline. Motor exam:  Low muscle mass.   Deferred.left grip strength is limited after trigger finger surgery.  Sensory:  Fine touch, pinprick and vibration were normal. I did not test the planta pedis.  Coordination: Rapid alternating movements in the fingers/hands is normal.  Finger-to-nose maneuver tested and normal without evidence of ataxia, dysmetria or tremor. Gait and station: Patient walks without assistive device. Strength within normal limits. Stance is stable and normal. Deep tendon reflexes: in the extremities are attenuated , symmetric and intact.   Assessment:  After physical and neurologic examination, review of laboratory studies, imaging, neurophysiology testing and pre-existing  records, assessment is   1)snoring, EDS- Epworth 18 points - highly elevated.  Central dominant Sleep Apnea was found  in 2011 - now his ophthalmologist noted retinal changes suggesting a correlation to untreated sleep apnea. I like for him to have an ASAP attended split night study.   2) new onset dysphagia- delayed clearing of the throat.   3) neuropathy - question if related to autoimmune sytem disorder or plaquenil. Mouth dryness.  Recommended Oasis mouth wash- Biotin.    Plan:  Treatment plan and additional workup :   ASAP SPLIT night study, early arrival 8 PM -  If needed for titration, let patient stay longer-Please!    Larey Seat, MD   CC Dyke Brackett, MD and Shon Baton, MD

## 2018-03-16 ENCOUNTER — Ambulatory Visit (INDEPENDENT_AMBULATORY_CARE_PROVIDER_SITE_OTHER): Payer: Medicare Other | Admitting: Neurology

## 2018-03-16 DIAGNOSIS — G629 Polyneuropathy, unspecified: Secondary | ICD-10-CM

## 2018-03-16 DIAGNOSIS — T378X5S Adverse effect of other specified systemic anti-infectives and antiparasitics, sequela: Secondary | ICD-10-CM

## 2018-03-16 DIAGNOSIS — Z950 Presence of cardiac pacemaker: Secondary | ICD-10-CM

## 2018-03-16 DIAGNOSIS — I781 Nevus, non-neoplastic: Secondary | ICD-10-CM

## 2018-03-16 DIAGNOSIS — G4731 Primary central sleep apnea: Secondary | ICD-10-CM

## 2018-03-16 DIAGNOSIS — R001 Bradycardia, unspecified: Secondary | ICD-10-CM

## 2018-03-16 DIAGNOSIS — R209 Unspecified disturbances of skin sensation: Secondary | ICD-10-CM

## 2018-03-19 ENCOUNTER — Encounter: Payer: Self-pay | Admitting: Internal Medicine

## 2018-03-19 ENCOUNTER — Ambulatory Visit (INDEPENDENT_AMBULATORY_CARE_PROVIDER_SITE_OTHER): Payer: Medicare Other | Admitting: Internal Medicine

## 2018-03-19 VITALS — BP 126/68 | HR 81 | Ht 74.0 in | Wt 166.4 lb

## 2018-03-19 DIAGNOSIS — I442 Atrioventricular block, complete: Secondary | ICD-10-CM

## 2018-03-19 DIAGNOSIS — Z950 Presence of cardiac pacemaker: Secondary | ICD-10-CM | POA: Diagnosis not present

## 2018-03-19 DIAGNOSIS — R0602 Shortness of breath: Secondary | ICD-10-CM | POA: Diagnosis not present

## 2018-03-19 NOTE — Telephone Encounter (Signed)
Pt is asking for a call from [employer].  Pt asking for a call re: next step after sleep study in addition to other concerns he is asking to discuss

## 2018-03-19 NOTE — Progress Notes (Signed)
PCP: Shon Baton, MD Primary Cardiologist: Dr Percival Spanish Primary EP:  Dr Rayann Heman  Quentez H Jeffery [name] is a 83 y.o. male who presents today for routine electrophysiology followup.  Since last being seen in our clinic, the patient reports doing very well.  Today, he denies symptoms of palpitations, chest pain, shortness of breath,  lower extremity edema, dizziness, presyncope, or syncope.  The patient is otherwise without complaint today.   Past Medical History:  Diagnosis Date  . Arthritis   . BCC (basal cell carcinoma)   . BPH (benign prostatic hypertrophy) 2008  . Cancer [facility]) 2008   Prostate  . Complete heart block ([place])   . Complex sleep apnea syndrome    does not use CPAP  . Diabetes mellitus without complication ([place])   . Diverticulosis   . ED (erectile dysfunction)   . GERD (gastroesophageal reflux disease)   . Hemorrhoids   . Hernia, inguinal   . Hyperlipidemia   . IFG (impaired fasting glucose)   . Osteopenia   . Peptic ulcer disease with hemorrhage 1993  . Shingles   . Vitamin D deficiency    Past Surgical History:  Procedure Laterality Date  . BACK SURGERY N/A 1984  . CATARACT EXTRACTION, BILATERAL    . HERNIA REPAIR    . LAMINECTOMY  1984   L 5  . PACEMAKER IMPLANT  12/05/2016   MDT Azure XT DR MRI conditional PPM implanted for CHB by Dr Vista Deck in [place]  . PACEMAKER INSERTION  12/05/2016  . PROSTATE SURGERY  12/2006   Robotic  . TONSILLECTOMY  83 years old  . TRIGGER FINGER RELEASE  03/01/2012   Procedure: MINOR RELEASE TRIGGER FINGER/A-1 PULLEY;  Surgeon: Cammie Sickle., MD;  Location: [facility];  Service: Orthopedics;  Laterality: Left;  long finger  . TRIGGER FINGER RELEASE Left 02/08/2018   Procedure: RELEASE TRIGGER FINGER/A-1 PULLEY LEFT RING FINGER;  Surgeon: Daryll Brod, MD;  Location: [facility];  Service: Orthopedics;  Laterality: Left;    ROS- all systems are reviewed and negative except as per HPI  above  Current Outpatient Medications  Medication Sig Dispense Refill  . acetaminophen (TYLENOL) 500 MG tablet Take 500 mg by mouth every 6 (six) hours as needed.    . Calcium Carbonate (CALCIUM 600 PO) Take 600 mg by mouth daily.    . Cholecalciferol (VITAMIN D) 2000 units CAPS Take by mouth daily.    . Coenzyme Q10 200 MG capsule Take 200 mg by mouth 2 (two) times daily.     . colesevelam (WELCHOL) 625 MG tablet Take 1,250 mg by mouth 3 (three) times daily.     . D3-50 50000 units capsule TAKE 1 CAPSULE BY MOUTH ONCE A MONTH 12 capsule 0  . diclofenac (FLECTOR) 1.3 % PTCH Place 1 patch onto the skin every 12 hours, as needed. 30 patch 1  . diclofenac sodium (VOLTAREN) 1 % GEL Apply 3 grams to 3 large joints, up to 3 times daily. 3 Tube 3  . folic acid (FOLVITE) 1 MG tablet TAKE 1 TABLET BY MOUTH EVERY DAY 90 tablet 0  . hydroxychloroquine (PLAQUENIL) 200 MG tablet TAKE 1 TABLET BY MOUTH TWICE DAILY 180 tablet 0  . metFORMIN (GLUCOPHAGE) 500 MG tablet Take 500 mg daily by mouth.     . Methotrexate, PF, (RASUVO) 15 MG/0.3ML SOAJ Inject 15 mg into the skin once a week. 12 pen 0  . omega-3 acid ethyl esters (LOVAZA) 1  g capsule TAKE 1 CAPSULE BY MOUTH THREE TIMES A WEEK 36 capsule 0  . ONE TOUCH ULTRA TEST test strip TEST BLOOD SUGAR BID AS DIRECTED  2  . vitamin E 100 UNIT capsule Take 100 Units by mouth daily.     No current facility-administered medications for this visit.     Physical Exam: Vitals:   03/19/18 1115  BP: 126/68  Pulse: 81  SpO2: 97%  Weight: 166 lb 6.4 oz (75.5 kg)  Height: 6\' 2"  (7.29 m)    GEN- The patient is well appearing, alert and oriented x 3 today.   Head- normocephalic, atraumatic Eyes-  Sclera clear, conjunctiva pink Ears- hearing intact Oropharynx- clear Lungs- Clear to ausculation bilaterally, normal work of breathing Chest- pacemaker pocket is well healed Heart- Regular rate and rhythm, no murmurs, rubs or gallops, PMI not laterally displaced GI-  soft, NT, ND, + BS Extremities- no clubbing, cyanosis, or edema  Pacemaker interrogation- reviewed in detail today,  See PACEART report  ekg tracing ordered today is personally reviewed and shows sinus with V pacing  Assessment and Plan:  1. Symptomatic complete heart block Normal pacemaker function See Pace Art report No changes today  2. Sleep apnea Followed by Dr Brett Fairy Compliance with therapy encouraged  3. SOB Rare.  He is active He requests follow-up with echo to see Dr Percival Spanish in 6 months  4. HL Stable No change required today    [facility] Return to see me in a year Follow-up with Dr Percival Spanish in 6 months with an echo per patient request  Thompson Grayer MD, [facility] 03/19/2018 11:35 AM

## 2018-03-19 NOTE — Patient Instructions (Addendum)
Medication Instructions:  Your physician recommends that you continue on your current medications as directed. Please refer to the Current Medication list given to you today.  Labwork: None ordered.  Testing/Procedures: None ordered.  Follow-Up: Your physician recommends that you schedule a follow-up appointment in:   6 months with Dr Percival Spanish and have your echo on the same day  12 months with Dr Rayann Heman.     Any Other Special Instructions Will Be Listed Below (If Applicable).     If you need a refill on your cardiac medications before your next appointment, please call your pharmacy.

## 2018-03-20 NOTE — Addendum Note (Signed)
Addended by: Larey Seat on: 03/20/2018 05:17 PM   Modules accepted: Orders

## 2018-03-20 NOTE — Procedures (Signed)
PATIENT'S NAME:  Jeffery [name]., Jerrett H DOB:      August 07, 1934      MR#:    [id number]     DATE OF RECORDING: 03/16/2018 REFERRING M.D.:  Shon Baton, MD Study Performed:  Split-Night Titration Study HISTORY:  [name], 84, presents after he had an extensive visit with his ophthalmologist and retinal specialist , Dr. Zadie Rhine, whom he sees for Plaquenil follow up. 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 Zadie Rhine asked the patient if he snores which she confirmed.  He also stated that treatment of obstructive sleep apnea may within a week resolve or alleviate the Tele-angiectasis. The patient is known to have currently untreated complex, central apnea.  The patient endorsed the Epworth Sleepiness Scale at 18 points   The patient's weight 166 pounds with a height of 74 (inches), resulting in a BMI of 21.2 kg/m2. The patient's neck circumference measured 15 inches.  CURRENT MEDICATIONS: Tylenol, Calcium, Vitamin D, CoQ10, Welchol, D vitamin, Voltaren, Folvite, Plaquenil, Glucophage, Rasuvo, Lovaza, Vitamin E    PROCEDURE:  This is a multichannel digital polysomnogram utilizing the Somnostar 11.2 system.  Electrodes and sensors were applied and monitored per AASM Specifications.   EEG, EOG, Chin and Limb EMG, were sampled at 200 Hz.  ECG, Snore and Nasal Pressure, Thermal Airflow, Respiratory Effort, CPAP Flow and Pressure, Oximetry was sampled at 50 Hz. Digital video and audio were recorded.      BASELINE STUDY WITHOUT CPAP RESULTS: Lights Out was at 20:55 and Lights On at 05:06.  Total recording time (TRT) was 195.5, with a total sleep time (TST) of 118 minutes.   The patient's sleep latency was 40.5 minutes.  REM latency was 148 minutes.  The sleep efficiency was 60.4 %.    SLEEP ARCHITECTURE: WASO (Wake after sleep onset) was 39.5 minutes,  Stage N1 was 13.5 minutes, Stage N2 was 95 minutes, Stage N3 was 0 minutes and Stage R (REM sleep) was 9.5 minutes.  The percentages were Stage N1 11.4%, Stage N2 80.5%, Stage N3 0% and Stage R (REM sleep) 8.1%.   RESPIRATORY ANALYSIS:  There were a total of 69 respiratory events:  39 obstructive apneas, 15 central apneas and 10 mixed apneas with a total of 64 apneas and an apnea index (AI) of 32.5. There were 5 hypopneas with a hypopnea index of 2.5.     The total APNEA/HYPOPNEA INDEX (AHI) was 35.1 /hour and the total RESPIRATORY DISTURBANCE INDEX was 35.1 /hour.  0 events occurred in REM sleep and 35 events in NREM. The REM AHI was 0, /hour versus a non-REM AHI of 38.2 /hour. The patient spent 222 minutes sleep time in the supine position 66 minutes in non-supine. The supine AHI was 70.9 /hour versus a non-supine AHI of 3.9 /hour.  OXYGEN SATURATION & C02:  The wake baseline 02 saturation was 94%, with the lowest being 86%. Time spent below 89% saturation equaled 16 minutes.   PERIODIC LIMB MOVEMENTS: The arousals were noted as: 17 were spontaneous, 19 were associated with PLMs, and 53 were associated with respiratory events. The patient had a total of 143 Periodic Limb Movements.  The Periodic Limb Movement (PLM) index was 72.7 /hour and the PLM Arousal index was 9.7 /hour. The patient took 2 bathroom breaks Snoring was mild. EKG was in keeping with paced rhythm, (with  some PVCs)  TITRATION STUDY WITH CPAP RESULTS:   CPAP was initiated at 5 cmH20 with heated humidity per AASM split night standards and pressure was advanced to 10 cmH20 because of hypopneas, apneas and desaturations.  At a PAP pressure of 10 cmH20, there was a reduction of the AHI to 0.0 /hour.   Total recording time (TRT) was 296.5 minutes, with a total sleep time (TST) of 169.5 minutes. The patient's sleep latency was 40.5 minutes. REM latency was 87 minutes.  The sleep efficiency was not improved at only 57.2 %.    SLEEP  ARCHITECTURE: Wake after sleep was 97.5 minutes, Stage N1 8.5 minutes, Stage N2 129.5 minutes, Stage N3 11 minutes and Stage R (REM sleep) 20.5 minutes. The percentages were: Stage N1 5.%, Stage N2 76.4%, Stage N3 6.5% and Stage R (REM sleep) 12.1%.  RESPIRATORY ANALYSIS:  There were a total of 49 respiratory events: 42 obstructive apneas, 5 central apneas and 2 hypopneas.     The total APNEA/HYPOPNEA INDEX (AHI) was 17.3 /hour.  0 events occurred in REM sleep and 49 events in NREM. The REM AHI was 0 /hour versus a non-REM AHI of 19.7 /hour. The patient spent 99% of total sleep time in the supine position. The supine AHI was 17.6 /hour, versus a non-supine AHI of 0.0/hour.  OXYGEN SATURATION & C02:  The wake baseline 02 saturation was 94%, with the lowest being 85%. Time spent below 89% saturation equaled 9 minutes.  PERIODIC LIMB MOVEMENTS:    The patient had a total of 109 Periodic Limb Movements. The Periodic Limb Movement (PLM) index was 38.6 /hour and the PLM Arousal index was 3.5 /hour. The arousals were noted as: 18 were spontaneous, 10 were associated with PLMs, and 44 were associated with respiratory events.  POLYSOMNOGRAPHY IMPRESSION :   1. Complex Sleep Apnea (CSA), mostly Obstructive.  2. PLMs during the diagnostic baseline part of this study.   3. Dysfunctions associated with sleep stages or arousals from sleep. 4. CPAP at 10 cm water eliminated the AHI to 0.0 but sleep was only recorded for 12 minutes.   RECOMMENDATIONS: I will place an order for auto-titration CPAP 5-12 cm water with 1 cm EPR, FFM of type SIMPLUS in medium size, and heated humidity.   A follow up appointment will be scheduled in the Sleep Clinic at [facility].      I certify that I have reviewed the entire raw data recording prior to the issuance of this report in accordance with the Standards of Accreditation of the American Academy of Sleep Medicine (AASM)      Larey Seat, M.D.     03-20-2018  Diplomat, American Board of Psychiatry and Neurology  Diplomat, [place] of Sleep Medicine Medical Director, [place] [facility]

## 2018-03-20 NOTE — Telephone Encounter (Signed)
Dr Brett Fairy has to read the study first and I have made her aware that the patient has called asking. Once she reads the study I will call and we will start the process with what she recommends.

## 2018-03-21 ENCOUNTER — Telehealth: Payer: Self-pay | Admitting: Neurology

## 2018-03-21 NOTE — Telephone Encounter (Signed)
-----   Message from Larey Seat, MD sent at 03/20/2018  5:17 PM EST ----- POLYSOMNOGRAPHY IMPRESSION :   1. Complex Sleep Apnea (CSA), mostly Obstructive.  2. PLMs during the diagnostic baseline part of this study.  3. Dysfunctions associated with sleep stages or arousals from  sleep. 4. CPAP at 10 cm water eliminated the AHI to 0.0 but sleep was  only recorded for 12 minutes.   RECOMMENDATIONS: I will place an order for auto-titration CPAP  5-12 cm water with 1 cm EPR, FFM of type SIMPLUS in medium size,  and heated humidity.

## 2018-03-21 NOTE — Telephone Encounter (Signed)
I called pt. I advised pt that Dr. Brett Fairy reviewed their sleep study results and found that pt has sleep apnea and was treated at a pressure of 10 cm water pressure. Dr. Brett Fairy recommends that pt starts auto CPAP 5-12 cm water pressure. I reviewed PAP compliance expectations with the pt. Pt is agreeable to starting a CPAP. I advised pt that an order will be sent to a DME, [facility], and [facility] will call the pt within about one week after they file with the pt's insurance. [facility] will show the pt how to use the machine, fit for masks, and troubleshoot the CPAP if needed. A follow up appt was made for insurance purposes with Dr. Brett Fairy on March 19,2020 at 8:30 am . Advised that at this time that is outside of the 90 day window in which insurance requires for him to follow up. Patient is scheduled on a trip at moment and flight arrives back on 06/26/17. Patient states that he is willing to change return flight plans so that he arrives within 90 day of being set up. Pt verbalized understanding to arrive 15 minutes early and bring their CPAP. A letter with all of this information in it will be mailed to the pt as a reminder. I verified with the pt that the address we have on file is correct. Pt verbalized understanding of results. Pt had no questions at this time but was encouraged to call back if questions arise. I have sent the order to [facility] and have received confirmation that they have received the order.

## 2018-03-25 ENCOUNTER — Other Ambulatory Visit: Payer: Self-pay | Admitting: Rheumatology

## 2018-03-26 ENCOUNTER — Ambulatory Visit: Payer: Medicare Other | Admitting: Neurology

## 2018-03-26 NOTE — Telephone Encounter (Signed)
Last Visit: 02/01/18 Next Visit: 08/07/18 Labs: 01/26/18 WNL Eye exam: 5/9/19WNL   Okay to refill per Dr. Estanislado Pandy

## 2018-03-27 ENCOUNTER — Other Ambulatory Visit: Payer: Self-pay | Admitting: Rheumatology

## 2018-03-27 NOTE — Telephone Encounter (Signed)
Last Visit: 02/01/18 Next Visit: 08/07/18  Okay to refill per Dr. Estanislado Pandy

## 2018-03-30 LAB — CUP PACEART INCLINIC DEVICE CHECK
Date Time Interrogation Session: 20191220141752
Implantable Lead Implant Date: 20180827
Implantable Lead Location: 753859
Implantable Lead Location: 753860
Implantable Lead Model: 4076
MDC IDC LEAD IMPLANT DT: 20180827
MDC IDC PG IMPLANT DT: 20180827

## 2018-04-03 ENCOUNTER — Other Ambulatory Visit: Payer: Self-pay | Admitting: Rheumatology

## 2018-04-03 DIAGNOSIS — M0609 Rheumatoid arthritis without rheumatoid factor, multiple sites: Secondary | ICD-10-CM

## 2018-04-05 NOTE — Telephone Encounter (Signed)
Last Visit:02/01/18 Next Visit:08/07/18 Labs: 01/26/18 WNL  Okay to refill per Dr. Estanislado Pandy

## 2018-04-18 ENCOUNTER — Other Ambulatory Visit: Payer: Self-pay

## 2018-04-18 DIAGNOSIS — Z79899 Other long term (current) drug therapy: Secondary | ICD-10-CM

## 2018-04-18 LAB — COMPLETE METABOLIC PANEL WITH GFR
AG Ratio: 1.7 (calc) (ref 1.0–2.5)
ALKALINE PHOSPHATASE (APISO): 66 U/L (ref 40–115)
ALT: 13 U/L (ref 9–46)
AST: 7 U/L — AB (ref 10–35)
Albumin: 4.3 g/dL (ref 3.6–5.1)
BILIRUBIN TOTAL: 0.5 mg/dL (ref 0.2–1.2)
BUN: 20 mg/dL (ref 7–25)
CHLORIDE: 99 mmol/L (ref 98–110)
CO2: 32 mmol/L (ref 20–32)
Calcium: 9.8 mg/dL (ref 8.6–10.3)
Creat: 0.77 mg/dL (ref 0.70–1.11)
GFR, Est African American: 99 mL/min/{1.73_m2} (ref >=60)
GFR, Est Non African American: 85 mL/min/{1.73_m2} (ref >=60)
GLUCOSE: 136 mg/dL — AB (ref 65–99)
Globulin: 2.6 g/dL (calc) (ref 1.9–3.7)
Potassium: 4.4 mmol/L (ref 3.5–5.3)
Sodium: 141 mmol/L (ref 135–146)
Total Protein: 6.9 g/dL (ref 6.1–8.1)

## 2018-04-18 LAB — CBC WITH DIFFERENTIAL/PLATELET
Absolute Monocytes: 535 cells/uL (ref 200–950)
Basophils Absolute: 11 cells/uL (ref 0–200)
Basophils Relative: 0.2 %
Eosinophils Absolute: 119 cells/uL (ref 15–500)
Eosinophils Relative: 2.2 %
HEMATOCRIT: 43.3 % (ref 38.5–50.0)
Hemoglobin: 14.6 g/dL (ref 13.2–17.1)
LYMPHS ABS: 2009 {cells}/uL (ref 850–3900)
MCH: 31.5 pg (ref 27.0–33.0)
MCHC: 33.7 g/dL (ref 32.0–36.0)
MCV: 93.3 fL (ref 80.0–100.0)
MPV: 9.7 fL (ref 7.5–12.5)
Monocytes Relative: 9.9 %
NEUTROS PCT: 50.5 %
Neutro Abs: 2727 cells/uL (ref 1500–7800)
Platelets: 303 10*3/uL (ref 140–400)
RBC: 4.64 10*6/uL (ref 4.20–5.80)
RDW: 12 % (ref 11.0–15.0)
Total Lymphocyte: 37.2 %
WBC: 5.4 10*3/uL (ref 3.8–10.8)

## 2018-05-07 ENCOUNTER — Other Ambulatory Visit: Payer: Self-pay | Admitting: Rheumatology

## 2018-05-07 DIAGNOSIS — M0609 Rheumatoid arthritis without rheumatoid factor, multiple sites: Secondary | ICD-10-CM

## 2018-05-08 NOTE — Telephone Encounter (Signed)
Last Visit:02/01/18 Next Visit:08/07/18 Labs: 04/18/18 Glucose is elevated-136. AST stable-7. All other labs are WNL.  Okay to refill per Dr. Estanislado Pandy

## 2018-06-14 ENCOUNTER — Ambulatory Visit (INDEPENDENT_AMBULATORY_CARE_PROVIDER_SITE_OTHER): Payer: Medicare Other | Admitting: *Deleted

## 2018-06-14 DIAGNOSIS — I442 Atrioventricular block, complete: Secondary | ICD-10-CM

## 2018-06-15 ENCOUNTER — Telehealth: Payer: Self-pay

## 2018-06-15 NOTE — Telephone Encounter (Signed)
Pt called to confirmed that his transmission was received. Pt is asking for the nurse to take a look at his transmission and give him a call back.  I told him if the nurse do not call him today she will call him on Monday. Pt also wants Erlene Quan from [employer] to see why his appointment did not appear on his device app. I tried to give him the number to [place] but did not want their help but wants Erlene Quan to help him. I told him I will try to get in touch with [facility]. Pt phone number is [phone].

## 2018-06-15 NOTE — Telephone Encounter (Signed)
Left message for patient to remind of missed remote transmission.  

## 2018-06-15 NOTE — Telephone Encounter (Signed)
Normal device function, no episodes. Will call patient on Monday  Chanetta Marshall, NP 06/15/2018 3:47 PM

## 2018-06-16 LAB — CUP PACEART REMOTE DEVICE CHECK
Battery Remaining Longevity: 116 mo
Brady Statistic AS VP Percent: 94.05 %
Brady Statistic AS VS Percent: 0.57 %
Brady Statistic RA Percent Paced: 5.61 %
Date Time Interrogation Session: 20200306170143
Implantable Lead Implant Date: 20180827
Implantable Lead Location: 753860
Implantable Lead Model: 4076
Lead Channel Impedance Value: 380 Ohm
Lead Channel Pacing Threshold Amplitude: 0.75 V
Lead Channel Pacing Threshold Pulse Width: 0.4 ms
Lead Channel Pacing Threshold Pulse Width: 0.4 ms
Lead Channel Sensing Intrinsic Amplitude: 6.625 mV
Lead Channel Setting Pacing Amplitude: 1.5 V
Lead Channel Setting Pacing Pulse Width: 0.4 ms
MDC IDC LEAD IMPLANT DT: 20180827
MDC IDC LEAD LOCATION: 753859
MDC IDC MSMT BATTERY VOLTAGE: 3.02 V
MDC IDC MSMT LEADCHNL RA IMPEDANCE VALUE: 342 Ohm
MDC IDC MSMT LEADCHNL RA IMPEDANCE VALUE: 399 Ohm
MDC IDC MSMT LEADCHNL RA SENSING INTR AMPL: 4.125 mV
MDC IDC MSMT LEADCHNL RA SENSING INTR AMPL: 4.125 mV
MDC IDC MSMT LEADCHNL RV IMPEDANCE VALUE: 437 Ohm
MDC IDC MSMT LEADCHNL RV PACING THRESHOLD AMPLITUDE: 0.875 V
MDC IDC MSMT LEADCHNL RV SENSING INTR AMPL: 6.625 mV
MDC IDC PG IMPLANT DT: 20180827
MDC IDC SET LEADCHNL RV PACING AMPLITUDE: 2.5 V
MDC IDC SET LEADCHNL RV SENSING SENSITIVITY: 4 mV
MDC IDC STAT BRADY AP VP PERCENT: 5.38 %
MDC IDC STAT BRADY AP VS PERCENT: 0 %
MDC IDC STAT BRADY RV PERCENT PACED: 99.43 %

## 2018-06-18 NOTE — Telephone Encounter (Signed)
Left message for patient. Normal device function.   Chanetta Marshall, NP 06/18/2018 10:45 AM

## 2018-06-18 NOTE — Telephone Encounter (Signed)
Spoke with patient.

## 2018-06-25 ENCOUNTER — Other Ambulatory Visit: Payer: Self-pay | Admitting: Rheumatology

## 2018-06-25 ENCOUNTER — Encounter: Payer: Self-pay | Admitting: Cardiology

## 2018-06-25 NOTE — Telephone Encounter (Signed)
Last Visit:02/01/18 Next Visit:08/07/18 Labs: 1/8/20Glucose is elevated-136. AST stable-7. All other labs are WNL. PLQ Eye Exam: 5/9/19WNL   Okay to refill per Dr. Deveshwar 

## 2018-06-25 NOTE — Progress Notes (Signed)
Remote pacemaker transmission.   

## 2018-06-26 ENCOUNTER — Telehealth: Payer: Self-pay | Admitting: Neurology

## 2018-06-26 NOTE — Telephone Encounter (Signed)
Patient was scheduled for apt 3/19 for initial cpap follow up. I called [facility] because technically his initial cpap follow up was due on 06/25/2018. With the virus and health related concerns and the fact the patient just traveled from [place] home we feel its best interest for the patient to folllow up later. I have postponed the apt to April 7,2020 at 8:30 am. This will allow [place] to see how everything is going and reassess at later time. I discussed this with Management at [facility] and they agree that pushing out the apt it ok on there standpoint.   In discussing with the patient he states that within a week of him using the machine the vision concerns that he was having cleared up and he has been using the machine compliantly. He has had difficulty with finding the correct mask, but continues to work with [facility] on different mask to try and help find the better one. Pt was appreciative for pushing out the apt

## 2018-06-26 NOTE — Telephone Encounter (Signed)
Patient would like a call back regarding his apt on Thursday - he would like to resch but feels the next available in July is toi far out. Wants a call back from [place]. Best call back is [phone]

## 2018-06-28 ENCOUNTER — Ambulatory Visit: Payer: Self-pay | Admitting: Neurology

## 2018-07-03 ENCOUNTER — Ambulatory Visit: Payer: Medicare Other | Admitting: Neurology

## 2018-07-14 ENCOUNTER — Other Ambulatory Visit: Payer: Self-pay | Admitting: Rheumatology

## 2018-07-16 ENCOUNTER — Encounter: Payer: Self-pay | Admitting: Neurology

## 2018-07-16 ENCOUNTER — Telehealth: Payer: Self-pay | Admitting: Neurology

## 2018-07-16 NOTE — Telephone Encounter (Signed)
Pt called back and I was able to go over his chart and make sure his chart is updated. Patient knows that the front will call him in the am and check him in to be ready to receive call from Dr Brett Fairy. Patient states best number is the [phone] for his phone visit.

## 2018-07-16 NOTE — Telephone Encounter (Signed)
Last Visit:02/01/18 Next Visit:08/07/18 Labs: 1/8/20Glucose is elevated-136. AST stable-7. All other labs are WNL. PLQ Eye Exam: 5/9/19WNL   Okay to refill per Dr. Estanislado Pandy

## 2018-07-16 NOTE — Telephone Encounter (Signed)
Called to discuss with the patient and make sure he was still ok with with his upcoming telephone visit with Dr Brett Fairy tomorrow morning at 8:30 am. Advised the patient I would also like to review his chart with him for tomorrow and make sure everything was up date. There was no answer. LVM informing him of above.

## 2018-07-17 ENCOUNTER — Ambulatory Visit (INDEPENDENT_AMBULATORY_CARE_PROVIDER_SITE_OTHER): Payer: Medicare Other | Admitting: Neurology

## 2018-07-17 ENCOUNTER — Other Ambulatory Visit: Payer: Self-pay

## 2018-07-17 DIAGNOSIS — M4802 Spinal stenosis, cervical region: Secondary | ICD-10-CM

## 2018-07-17 DIAGNOSIS — G629 Polyneuropathy, unspecified: Secondary | ICD-10-CM | POA: Diagnosis not present

## 2018-07-17 DIAGNOSIS — Z9989 Dependence on other enabling machines and devices: Secondary | ICD-10-CM

## 2018-07-17 DIAGNOSIS — M9981 Other biomechanical lesions of cervical region: Secondary | ICD-10-CM

## 2018-07-17 DIAGNOSIS — G4731 Primary central sleep apnea: Secondary | ICD-10-CM

## 2018-07-17 DIAGNOSIS — G4733 Obstructive sleep apnea (adult) (pediatric): Secondary | ICD-10-CM

## 2018-07-17 DIAGNOSIS — R001 Bradycardia, unspecified: Secondary | ICD-10-CM

## 2018-07-17 DIAGNOSIS — Z950 Presence of cardiac pacemaker: Secondary | ICD-10-CM | POA: Diagnosis not present

## 2018-07-17 DIAGNOSIS — G4739 Other sleep apnea: Secondary | ICD-10-CM

## 2018-07-17 NOTE — Progress Notes (Signed)
Virtual Visit via Telephone Note  I connected with Keelen [name] O'Tuel Jr on 07/22/18 at  8:30 AM EDT by telephone and verified that I am speaking with the correct person using two identifiers.   I discussed the limitations, risks, security and privacy concerns of performing an evaluation and management service by telephone and the availability of in person appointments. I also discussed with the patient that there may be a patient responsible charge related to this service. The patient expressed understanding and agreed to proceed.  Larey Seat, MD   History of Present Illness: [name], Jeffery [name]. is a meanwhile 84 year old Caucasian gentleman who has been followed in my practice for neuropathy.  It was his ophthalmologist, Dr. Zadie Rhine, who referred him specifically for a sleep study after he noted teleangiectasis at the macula- congested blood vessels in the retina.  The patient underwent a sleep study which confirmed a complex sleep apnea he had a previous sleep study in 2016 which at reported primary central sleep apnea. This sleep study let to initiation of CPAP , and within less than a month the ophthalmological findings had resolved.      His compliance download looks excellent, he has been 97% compliant in spite of the high air leak his obstructive sleep apnea index was 3.9/[name] central 1.0/[name] and there were several unknown types of apnea indicated that the machine could not differentiate the type due to air leakage.  We discussed 2 possibilities for strategy in addition there were some medication changes in the most recent months for this patient for his pacemaker.  He took a trip to [place] for 14 days and traveled with his CPAP machine.    He tried 5 different masks but always kept back to the full facemask in spite of a higher air leak.  (The baseline AHI was 35.1/[name] and the AHI exacerbated in supine sleep position to 70.9/[name]).  The patient felt obliged to use a interface that would  not use magnets due to his pacemaker status.  He is also using a Sumner fix chinstrap to keep him from opening his mouth during sleep, thus creating an oral air leak.     Observations/Objective:  CPAP download reviewed and patient's observation / report of aerophagia taken into consideration.   Assessment and Plan:  He is currently trying the follow-up model also Simplus full facemask by [employer], he would like to have an extra long hose he needs more range of motion for his neck.    I suggested that the use very little ramp time and help him to control his aerophagia concern by increasing the EPR to 3 cmH2O and increasing the maximum pressure of his auto titration settings to 13 cmH2O.      Follow Up Instructions: Patient will provide another download after 30 days of use at new CPAP settings.  Extra long hose/ tubing offered through DME>     I discussed the assessment and treatment plan with the patient. The patient was provided an opportunity to ask questions and all were answered. The patient agreed with the plan and demonstrated an understanding of the instructions.   The patient was advised to call back or seek an in-person evaluation if the symptoms worsen or if the condition fails to improve as anticipated.  I provided 18 minutes of non-face-to-face time during this encounter.     PS :  Sleep study confirmed the presence of Sleep Apnea and let to initiation of Auto CPAP therapy.  PATIENT'S NAME:  Jeffery [name]., Jeffery [name] DOB:      [birth year]      MR#:    [id number]     DATE OF RECORDING: 03/16/2018 REFERRING M.D.:  Shon Baton, MD Study Performed:  Split-Night Titration Study HISTORY:  [name], 84, presents after he had an extensive visit with his ophthalmologist and retinal specialist , Dr. Zadie Rhine, whom he sees for Plaquenil follow up. 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 Zadie Rhine asked the patient if he snores which she confirmed.  He also stated that treatment of obstructive sleep apnea may within a week resolve or alleviate the Tele-angiectasis. The patient is known to have currently untreated complex, central apnea.  The patient endorsed the Epworth Sleepiness Scale at 18 points   The patient's weight 166 pounds with a height of 74 (inches), resulting in a BMI of 21.2 kg/m2. The patient's neck circumference measured 15 inches.  CURRENT MEDICATIONS: Tylenol, Calcium, Vitamin D, CoQ10, Welchol, D vitamin, Voltaren, Folvite, Plaquenil, Glucophage, Rasuvo, Lovaza, Vitamin E    PROCEDURE:  This is a multichannel digital polysomnogram utilizing the Somnostar 11.2 system.  Electrodes and sensors were applied and monitored per AASM Specifications.   EEG, EOG, Chin and Limb EMG, were sampled at 200 Hz.  ECG, Snore and Nasal Pressure, Thermal Airflow, Respiratory Effort, CPAP Flow and Pressure, Oximetry was sampled at 50 Hz. Digital video and audio were recorded.      BASELINE STUDY WITHOUT CPAP RESULTS: Lights Out was at 20:55 and Lights On at 05:06.  Total recording time (TRT) was 195.5, with a total sleep time (TST) of 118 minutes.   The patient's sleep latency was 40.5 minutes.  REM latency was 148 minutes.  The sleep efficiency was 60.4 %.    SLEEP ARCHITECTURE: WASO (Wake after sleep onset) was 39.5 minutes, Stage N1 was 13.5 minutes, Stage N2 was 95 minutes, Stage N3 was 0 minutes and Stage R (REM sleep) was 9.5 minutes.  The percentages were Stage N1 11.4%, Stage N2 80.5%, Stage N3 0% and Stage R (REM sleep) 8.1%.   RESPIRATORY ANALYSIS:  There were a total of 69 respiratory events:  39 obstructive apneas, 15 central apneas and 10 mixed apneas with a total of 64 apneas and an apnea index (AI) of 32.5. There were 5 hypopneas with a hypopnea index of 2.5.     The total APNEA/HYPOPNEA INDEX  (AHI) was 35.1 /hour and the total RESPIRATORY DISTURBANCE INDEX was 35.1 /hour.  0 events occurred in REM sleep and 35 events in NREM. The REM AHI was 0, /hour versus a non-REM AHI of 38.2 /hour. The patient spent 222 minutes sleep time in the supine position 66 minutes in non-supine. The supine AHI was 70.9 /hour versus a non-supine AHI of 3.9 /hour.  OXYGEN SATURATION & C02:  The wake baseline 02 saturation was 94%, with the lowest being 86%. Time spent below 89% saturation equaled 16 minutes.   PERIODIC LIMB MOVEMENTS: The arousals were noted as: 17 were spontaneous, 19 were associated with PLMs, and 53 were associated with respiratory events. The patient had a total of 143 Periodic Limb Movements.  The Periodic Limb Movement (PLM) index was 72.7 /hour and the PLM Arousal index was 9.7 /hour. The patient took 2 bathroom breaks Snoring was mild. EKG was in keeping with paced rhythm, (  with some PVCs)  TITRATION STUDY WITH CPAP RESULTS:   CPAP was initiated at 5 cmH20 with heated humidity per AASM split night standards and pressure was advanced to 10 cmH20 because of hypopneas, apneas and desaturations.  At a PAP pressure of 10 cmH20, there was a reduction of the AHI to 0.0 /hour.   Total recording time (TRT) was 296.5 minutes, with a total sleep time (TST) of 169.5 minutes. The patient's sleep latency was 40.5 minutes. REM latency was 87 minutes.  The sleep efficiency was not improved at only 57.2 %.    SLEEP ARCHITECTURE: Wake after sleep was 97.5 minutes, Stage N1 8.5 minutes, Stage N2 129.5 minutes, Stage N3 11 minutes and Stage R (REM sleep) 20.5 minutes. The percentages were: Stage N1 5.%, Stage N2 76.4%, Stage N3 6.5% and Stage R (REM sleep) 12.1%.  RESPIRATORY ANALYSIS:  There were a total of 49 respiratory events: 42 obstructive apneas, 5 central apneas and 2 hypopneas.     The total APNEA/HYPOPNEA INDEX (AHI) was 17.3 /hour.  0 events occurred in REM sleep and 49 events in NREM. The REM  AHI  ed 9 minutes.  PERIODIC LIMB MOVEMENTS:    The patient had a total of 109 Periodic Limb Movements. The Periodic Limb Movement (PLM) index was 38.6 /hour and the PLM Arousal index was 3.5 /hour.   The arousals were noted as: 18 were spontaneous, 10 were associated with PLMs, and 44 were associated with respiratory events.  POLYSOMNOGRAPHY IMPRESSION :   1. Complex Sleep Apnea (CSA), mostly Obstructive.  2. PLMs during the diagnostic baseline part of this study.   3. Dysfunctions associated with sleep stages or arousals from sleep. 4. CPAP at 10 cm water eliminated the AHI to 0.0 but sleep was only recorded for 12 minutes.   RECOMMENDATIONS: I will place an order for auto-titration CPAP 5-12 cm water with 1 cm EPR, FFM of type SIMPLUS in medium size, and heated humidity.   A follow up appointment will be scheduled in the Sleep Clinic at [facility].      I certify that I have reviewed the entire raw data recording prior to the issuance of this report in accordance with the Standards of Accreditation of the American Academy of Sleep Medicine (AASM)      Larey Seat, M.D.    03-20-2018  Diplomat, American Board of Psychiatry and Neurology  Diplomat, [place] [facility] Medical Director, [place] [facility]

## 2018-07-22 ENCOUNTER — Encounter: Payer: Self-pay | Admitting: Neurology

## 2018-08-03 ENCOUNTER — Other Ambulatory Visit: Payer: Self-pay | Admitting: Rheumatology

## 2018-08-03 NOTE — Telephone Encounter (Signed)
Last Visit:02/01/18 Next Visit:09/06/18  Okay to refill per Dr. Estanislado Pandy

## 2018-08-07 ENCOUNTER — Ambulatory Visit: Payer: Medicare Other | Admitting: Rheumatology

## 2018-08-18 ENCOUNTER — Other Ambulatory Visit: Payer: Self-pay | Admitting: Rheumatology

## 2018-08-18 DIAGNOSIS — M0609 Rheumatoid arthritis without rheumatoid factor, multiple sites: Secondary | ICD-10-CM

## 2018-08-20 ENCOUNTER — Other Ambulatory Visit (HOSPITAL_COMMUNITY): Payer: Medicare Other

## 2018-08-20 NOTE — Telephone Encounter (Signed)
Last Visit:02/01/18 Next Visit:09/06/18 Labs: 04/18/18 Glucose is elevated-136. AST stable-7. All other labs are WNL.  Patient advised he is due to update labs. Patient will update 08/27/18.  Okay to refill 30 day supply per Dr. Estanislado Pandy

## 2018-08-23 ENCOUNTER — Telehealth: Payer: Self-pay | Admitting: Rheumatology

## 2018-08-23 NOTE — Telephone Encounter (Signed)
Patient left a voicemail checking if we received his labwork.  Patient is requesting a return call to let him know either way.

## 2018-08-23 NOTE — Telephone Encounter (Signed)
Patient advised we have not received the lab results. Patient advised I would contact PCP's office to request lab results.

## 2018-08-24 NOTE — Progress Notes (Signed)
Office Visit Note  Patient: Jeffery [name]             Date of Birth: May 15, 1934           MRN: [id number]             PCP: Shon Baton, MD Referring: Shon Baton, MD Visit Date: 09/06/2018 Occupation: @GUAROCC @  Subjective:  Medication monitoring.    History of Present Illness: Jeffery [name] is a 84 y.o. male with history of rheumatoid arthritis, polymyalgia and osteoarthritis.  He states he has not had any major flares of rheumatoid arthritis since he has been on methotrexate and Plaquenil combination.  He has been very active doing gardening walking and also playing golf.  He had mild tenderness in his right wrist which is resolved now.  He denies any flare of polymyalgia rheumatica.  He has been having some stiffness in his joints from underlying osteoarthritis and degenerative disc disease.  He states he has some thoracic muscular discomfort.  He does have history of disc disease.  Activities of Daily Living:  Patient reports morning stiffness for 30 minutes.   Patient Denies nocturnal pain.  Difficulty dressing/grooming: Denies Difficulty climbing stairs: Denies Difficulty getting out of chair: Denies Difficulty using hands for taps, buttons, cutlery, and/or writing: Denies  Review of Systems  Constitutional: Positive for fatigue. Negative for night sweats.  HENT: Positive for mouth dryness. Negative for mouth sores and nose dryness.   Eyes: Negative for redness and dryness.  Respiratory: Negative for shortness of breath and difficulty breathing.   Cardiovascular: Positive for palpitations. Negative for chest pain, hypertension, irregular heartbeat and swelling in legs/feet.  Gastrointestinal: Negative for constipation and diarrhea.  Endocrine: Negative for increased urination.  Musculoskeletal: Positive for morning stiffness. Negative for arthralgias, joint pain, joint swelling, myalgias, muscle weakness, muscle tenderness and myalgias.  Skin: Negative for color  change, rash, hair loss, nodules/bumps, skin tightness, ulcers and sensitivity to sunlight.  Allergic/Immunologic: Negative for susceptible to infections.  Neurological: Negative for dizziness, fainting, memory loss, night sweats and weakness ( ).  Hematological: Negative for swollen glands.  Psychiatric/Behavioral: Positive for sleep disturbance. Negative for depressed mood. The patient is not nervous/anxious.        CPAP    PMFS History:  Patient Active Problem List   Diagnosis Date Noted  . Neuropathy 08/21/2017  . Hydroxychloroquine causing adverse effect in therapeutic use 08/21/2017  . Symptomatic bradycardia 02/23/2017  . Status cardiac pacemaker 02/23/2017  . CSA (central sleep apnea) 02/23/2017  . Heart block AV complete ([place]) 01/18/2017  . Vitamin D deficiency 02/08/2016  . Trigger finger, left middle finger 02/08/2016  . High risk medication use 02/08/2016  . Bursitis of right shoulder 02/08/2016  . DDD (degenerative disc disease), lumbar 02/08/2016  . DDD (degenerative disc disease), thoracic 02/08/2016  . Rheumatoid arthritis of multiple sites without rheumatoid factor ([place]) 11/28/2015    Class: Chronic  . Other secondary osteoarthritis of both hands 11/28/2015    Class: Chronic  . PMR (polymyalgia rheumatica) (HCC) 11/28/2015    Class: Chronic  . Osteoporosis 11/28/2015    Class: Chronic  . Prostate cancer ([place]) 11/28/2015    Class: History of  . Duodenal ulcer 11/28/2015    Class: Chronic  . High cholesterol 11/28/2015  . Dizziness 07/16/2015  . Incomplete RBBB 03/31/2015  . Sleep apnea, primary central 03/03/2015  . Polyneuropathy ([place]) 01/24/2013  . Right inguinal hernia 07/05/2012    Past Medical History:  Diagnosis  Date  . Arthritis   . BCC (basal cell carcinoma)   . BPH (benign prostatic hypertrophy) 2008  . Cancer [facility]) 2008   Prostate  . Complete heart block ([place])   . Complex sleep apnea syndrome    does not use CPAP  . Diabetes mellitus  without complication ([place])   . Diverticulosis   . ED (erectile dysfunction)   . GERD (gastroesophageal reflux disease)   . Hemorrhoids   . Hernia, inguinal   . Hyperlipidemia   . IFG (impaired fasting glucose)   . Osteopenia   . Peptic ulcer disease with hemorrhage 1993  . Shingles   . Vitamin D deficiency     Family History  Problem Relation Age of Onset  . Heart failure Mother        Died age 63.  Apparently had valve surgery.   . Congenital heart disease Brother    Past Surgical History:  Procedure Laterality Date  . BACK SURGERY N/A 1984  . CATARACT EXTRACTION, BILATERAL    . HERNIA REPAIR    . LAMINECTOMY  1984   L 5  . PACEMAKER IMPLANT  12/05/2016   MDT Azure XT DR MRI conditional PPM implanted for CHB by Dr Vista Deck in [place]  . PACEMAKER INSERTION  12/05/2016  . PROSTATE SURGERY  12/2006   Robotic  . TONSILLECTOMY  84 years old  . TRIGGER FINGER RELEASE  03/01/2012   Procedure: MINOR RELEASE TRIGGER FINGER/A-1 PULLEY;  Surgeon: Cammie Sickle., MD;  Location: [facility];  Service: Orthopedics;  Laterality: Left;  long finger  . TRIGGER FINGER RELEASE Left 02/08/2018   Procedure: RELEASE TRIGGER FINGER/A-1 PULLEY LEFT RING FINGER;  Surgeon: Daryll Brod, MD;  Location: [facility];  Service: Orthopedics;  Laterality: Left;   Social History   Social History Narrative   Right handed, Retired .Married, 2 kids. Caffeine none.   3 Step    Immunization History  Administered Date(s) Administered  . Influenza, High Dose Seasonal PF 03/03/2017, 01/26/2018     Objective: Vital Signs: BP 117/66 (BP Location: Left Arm, Patient Position: Sitting, Cuff Size: Normal)   Pulse 70   Resp 14   Ht 6\' 2"  (1.88 m)   Wt 173 lb (78.5 kg)   BMI 22.21 kg/m    Physical Exam Vitals signs and nursing note reviewed.  Constitutional:      Appearance: He is well-developed.  HENT:     Head: Normocephalic and atraumatic.  Eyes:     Conjunctiva/sclera:  Conjunctivae normal.     Pupils: Pupils are equal, round, and reactive to light.  Neck:     Musculoskeletal: Normal range of motion and neck supple.  Cardiovascular:     Rate and Rhythm: Normal rate and regular rhythm.     Heart sounds: Normal heart sounds.     Comments: pacemaker Pulmonary:     Effort: Pulmonary effort is normal.     Breath sounds: Normal breath sounds.  Abdominal:     General: Bowel sounds are normal.     Palpations: Abdomen is soft.  Skin:    General: Skin is warm and dry.     Capillary Refill: Capillary refill takes less than 2 seconds.  Neurological:     Mental Status: He is alert and oriented to person, place, and time.  Psychiatric:        Behavior: Behavior normal.      Musculoskeletal Exam: C-spine thoracic and lumbar spine good range of motion.  He has some thoracic muscular discomfort.  Shoulder joints elbow joints wrist joints with good range of motion.  He has DIP and PIP thickening in his hands.  Hip joints knee joints ankles MTPs PIPs with good range of motion with no synovitis.  He has good muscle strength in both upper and lower extremities.  CDAI Exam: CDAI Score: 0.4  Patient Global Assessment: 2 (mm); Provider Global Assessment: 2 (mm) Swollen: 0 ; Tender: 0  Joint Exam   Not documented   There is currently no information documented on the homunculus. Go to the Rheumatology activity and complete the homunculus joint exam.  Investigation: No additional findings.  Imaging: No results found.  Recent Labs: Lab Results  Component Value Date   WBC 5.4 04/18/2018   HGB 14.6 04/18/2018   PLT 303 04/18/2018   NA 141 04/18/2018   K 4.4 04/18/2018   CL 99 04/18/2018   CO2 32 04/18/2018   GLUCOSE 136 (H) 04/18/2018   BUN 20 04/18/2018   CREATININE 0.77 04/18/2018   BILITOT 0.5 04/18/2018   ALKPHOS 60 10/08/2016   AST 7 (L) 04/18/2018   ALT 13 04/18/2018   PROT 6.9 04/18/2018   ALBUMIN 4.4 10/08/2016   CALCIUM 9.8 04/18/2018    GFRAA 99 04/18/2018   Sep 06, 2018 labs from Dr. Keane Police office showed UA negative, CBC normal, CMP normal, LDL 112, TSH normal, PSA normal, vitamin D 40.9 Speciality Comments: PLQ Eye Exam: 5/9/19WNL @ [place]  Procedures:  No procedures performed Allergies: Sulfasalazine; Demerol [meperidine]; Garlic; Other; and Sulfa antibiotics   Assessment / Plan:     Visit Diagnoses: Rheumatoid arthritis of multiple sites without rheumatoid factor (HCC) - RF negative, CCP negative, 1433 eta negative, ANA negative.  He has been doing well on current combination of medication.  Rasuvo has been quite expensive for him on monthly basis.  We will switch him to subcu methotrexate and will give prescription for syringes.  High risk medication use - He is taking Rasuvo 15 mg every 7 days, Plaquenil 200 mg twice daily, and folic acid 1 mg daily.  Last Plaquenil eye exam normal on 08/17/2017.  Most recent CBC/CMP stable on 04/18/2018.  He had labs with his PCP a week ago.  According to patient the labs were normal.  We will monitor every 3 months.  Standing orders are in place.  He received his flu vaccine in October.  Recommend Pneumovax 23, Prevnar 13, and Shingrix vaccines as indicated.  PMR (polymyalgia rheumatica) (HCC)-in remission without any muscle weakness or tenderness.  Primary osteoarthritis of both hands-he has some stiffness in his hands.  DDD (degenerative disc disease), cervical-doing well.  DDD (degenerative disc disease), lumbar-he gives history of a stiffness.  DDD (degenerative disc disease), thoracic-he has been experiencing muscular discomfort in thoracic region.  I have given him a handout on exercises.  Age-related osteoporosis without current pathological fracture-he is taking calcium and vitamin D.  Other fatigue  History of prostate cancer  History of diabetes mellitus  History of sleep apnea - on CPAP  History of vitamin D deficiency  Heart block AV complete ([place])    Orders: No orders of the defined types were placed in this encounter.  Meds ordered this encounter  Medications  . methotrexate 50 MG/2ML injection    Sig: Inject 0.6 mLs (15 mg total) into the skin once a week.    Dispense:  8 mL    Refill:  0  . TUBERCULIN SYR 1CC/27GX1/2" (  B-D TB SYRINGE 1CC/27GX1/2") 27G X 1/2" 1 ML MISC    Sig: 12 Syringes by Does not apply route once a week.    Dispense:  12 each    Refill:  3    Face-to-face time spent with patient was 30 minutes. Greater than 50% of time was spent in counseling and coordination of care.  Follow-Up Instructions: Return in about 5 months (around 02/06/2019) for Rheumatoid arthritis, Osteoarthritis,PMR.   Bo Merino, MD  Note - This record has been created using Editor, commissioning.  Chart creation errors have been sought, but may not always  have been located. Such creation errors do not reflect on  the standard of medical care.

## 2018-08-28 ENCOUNTER — Telehealth: Payer: Self-pay | Admitting: Neurology

## 2018-08-28 NOTE — Telephone Encounter (Signed)
Called the patient to set up for a follow up visit 55mths. The pt request Dr Brett Fairy and I have set him up with soonest I have is a aug. Pt asked to be pushed out to first week of oct.

## 2018-09-06 ENCOUNTER — Encounter: Payer: Self-pay | Admitting: Rheumatology

## 2018-09-06 ENCOUNTER — Other Ambulatory Visit: Payer: Self-pay

## 2018-09-06 ENCOUNTER — Ambulatory Visit (INDEPENDENT_AMBULATORY_CARE_PROVIDER_SITE_OTHER): Payer: Medicare Other | Admitting: Rheumatology

## 2018-09-06 VITALS — BP 117/66 | HR 70 | Resp 14 | Ht 74.0 in | Wt 173.0 lb

## 2018-09-06 DIAGNOSIS — M353 Polymyalgia rheumatica: Secondary | ICD-10-CM

## 2018-09-06 DIAGNOSIS — Z79899 Other long term (current) drug therapy: Secondary | ICD-10-CM

## 2018-09-06 DIAGNOSIS — M19041 Primary osteoarthritis, right hand: Secondary | ICD-10-CM | POA: Diagnosis not present

## 2018-09-06 DIAGNOSIS — Z8546 Personal history of malignant neoplasm of prostate: Secondary | ICD-10-CM

## 2018-09-06 DIAGNOSIS — M0609 Rheumatoid arthritis without rheumatoid factor, multiple sites: Secondary | ICD-10-CM | POA: Diagnosis not present

## 2018-09-06 DIAGNOSIS — R5383 Other fatigue: Secondary | ICD-10-CM

## 2018-09-06 DIAGNOSIS — M5136 Other intervertebral disc degeneration, lumbar region: Secondary | ICD-10-CM

## 2018-09-06 DIAGNOSIS — Z8669 Personal history of other diseases of the nervous system and sense organs: Secondary | ICD-10-CM

## 2018-09-06 DIAGNOSIS — M5134 Other intervertebral disc degeneration, thoracic region: Secondary | ICD-10-CM

## 2018-09-06 DIAGNOSIS — I442 Atrioventricular block, complete: Secondary | ICD-10-CM

## 2018-09-06 DIAGNOSIS — M5030 Other cervical disc degeneration, unspecified cervical region: Secondary | ICD-10-CM

## 2018-09-06 DIAGNOSIS — Z8639 Personal history of other endocrine, nutritional and metabolic disease: Secondary | ICD-10-CM

## 2018-09-06 DIAGNOSIS — M19042 Primary osteoarthritis, left hand: Secondary | ICD-10-CM

## 2018-09-06 DIAGNOSIS — M810 Age-related osteoporosis without current pathological fracture: Secondary | ICD-10-CM

## 2018-09-06 MED ORDER — "TUBERCULIN SYRINGE 27G X 1/2"" 1 ML MISC": 12.0000 | 3 refills | Status: DC

## 2018-09-06 MED ORDER — METHOTREXATE SODIUM CHEMO INJECTION 50 MG/2ML: 15.0000 mg | INTRAMUSCULAR | 0 refills | Status: DC

## 2018-09-06 NOTE — Patient Instructions (Addendum)
Standing Labs We placed an order today for your standing lab work.    Please come back and get your standing labs in August and every 3 months  We have open lab daily Monday through Thursday from 8:30-12:30 PM and 1:30-4:30 PM and Friday from 8:30-12:30 PM and 1:30 -4:00 PM at the office of Dr. Bo Merino.   You may experience shorter wait times on Monday and Friday afternoons. The office is located at [address], [place], [place], [place] [address] No appointment is necessary.   Labs are drawn by [facility].  You may receive a bill from [facility] for your lab work.  Back Exercises The following exercises strengthen the muscles that help to support the back. They also help to keep the lower back flexible. Doing these exercises can help to prevent back pain or lessen existing pain. If you have back pain or discomfort, try doing these exercises 2-3 times each day or as told by your health care provider. When the pain goes away, do them once each day, but increase the number of times that you repeat the steps for each exercise (do more repetitions). If you do not have back pain or discomfort, do these exercises once each day or as told by your health care provider. Exercises Single Knee to Chest Repeat these steps 3-5 times for each leg: 1. Lie on your back on a firm bed or the floor with your legs extended. 2. Bring one knee to your chest. Your other leg should stay extended and in contact with the floor. 3. Hold your knee in place by grabbing your knee or thigh. 4. Pull on your knee until you feel a gentle stretch in your lower back. 5. Hold the stretch for 10-30 seconds. 6. Slowly release and straighten your leg. Pelvic Tilt Repeat these steps 5-10 times: 1. Lie on your back on a firm bed or the floor with your legs extended. 2. Bend your knees so they are pointing toward the ceiling and your feet are flat on the floor. 3. Tighten your lower abdominal muscles to press your lower  back against the floor. This motion will tilt your pelvis so your tailbone points up toward the ceiling instead of pointing to your feet or the floor. 4. With gentle tension and even breathing, hold this position for 5-10 seconds. Cat-Cow Repeat these steps until your lower back becomes more flexible: 1. Get into a hands-and-knees position on a firm surface. Keep your hands under your shoulders, and keep your knees under your hips. You may place padding under your knees for comfort. 2. Let your head hang down, and point your tailbone toward the floor so your lower back becomes rounded like the back of a cat. 3. Hold this position for 5 seconds. 4. Slowly lift your head and point your tailbone up toward the ceiling so your back forms a sagging arch like the back of a cow. 5. Hold this position for 5 seconds.  Press-Ups Repeat these steps 5-10 times: 1. Lie on your abdomen (face-down) on the floor. 2. Place your palms near your head, about shoulder-width apart. 3. While you keep your back as relaxed as possible and keep your hips on the floor, slowly straighten your arms to raise the top half of your body and lift your shoulders. Do not use your back muscles to raise your upper torso. You may adjust the placement of your hands to make yourself more comfortable. 4. Hold this position for 5 seconds while  you keep your back relaxed. 5. Slowly return to lying flat on the floor.  Bridges Repeat these steps 10 times: 1. Lie on your back on a firm surface. 2. Bend your knees so they are pointing toward the ceiling and your feet are flat on the floor. 3. Tighten your buttocks muscles and lift your buttocks off of the floor until your waist is at almost the same height as your knees. You should feel the muscles working in your buttocks and the back of your thighs. If you do not feel these muscles, slide your feet 1-2 inches farther away from your buttocks. 4. Hold this position for 3-5  seconds. 5. Slowly lower your hips to the starting position, and allow your buttocks muscles to relax completely. If this exercise is too easy, try doing it with your arms crossed over your chest. Abdominal Crunches Repeat these steps 5-10 times: 1. Lie on your back on a firm bed or the floor with your legs extended. 2. Bend your knees so they are pointing toward the ceiling and your feet are flat on the floor. 3. Cross your arms over your chest. 4. Tip your chin slightly toward your chest without bending your neck. 5. Tighten your abdominal muscles and slowly raise your trunk (torso) high enough to lift your shoulder blades a tiny bit off of the floor. Avoid raising your torso higher than that, because it can put too much stress on your low back and it does not help to strengthen your abdominal muscles. 6. Slowly return to your starting position. Back Lifts Repeat these steps 5-10 times: 1. Lie on your abdomen (face-down) with your arms at your sides, and rest your forehead on the floor. 2. Tighten the muscles in your legs and your buttocks. 3. Slowly lift your chest off of the floor while you keep your hips pressed to the floor. Keep the back of your head in line with the curve in your back. Your eyes should be looking at the floor. 4. Hold this position for 3-5 seconds. 5. Slowly return to your starting position. Contact a health care provider if:  Your back pain or discomfort gets much worse when you do an exercise.  Your back pain or discomfort does not lessen within 2 hours after you exercise. If you have any of these problems, stop doing these exercises right away. Do not do them again unless your health care provider says that you can. Get help right away if:  You develop sudden, severe back pain. If this happens, stop doing the exercises right away. Do not do them again unless your health care provider says that you can. This information is not intended to replace advice given to  you by your health care provider. Make sure you discuss any questions you have with your health care provider. Document Released: 05/05/2004 Document Revised: 08/01/2017 Document Reviewed: 05/22/2014 Elsevier Interactive Patient Education  [employer].  If you wish to have your labs drawn at another location, please call the office 24 hours in advance to send orders.  If you have any questions regarding directions or hours of operation,  please call [phone].   Just as a reminder please drink plenty of water prior to coming for your lab work. Thanks!

## 2018-09-06 NOTE — Progress Notes (Signed)
Pharmacy Note  Subjective: Patient presents today to the [facility] to see Dr. Estanislado Pandy.  Patient seen by the pharmacist for injection technique training on vial and syringe methotrexate for rheumatoid arthritis. He is currently using Rasuvo pens but is unaffordable with monthly co-pay of $250.  Assessment/Plan:   Educated patient on how to use a vial and syringe and reviewed injection technique with patient.  Patient was able to demonstrate proper technique for injections using vial and syringe.  Provided patient educational material regarding injection technique and storage of methotrexate.    All questions encouraged and answered.  Instructed patient to call with any further questions or concerns.  Mariella Saa, PharmD, [facility] Rheumatology Clinical Pharmacist  09/06/2018 11:51 AM

## 2018-09-11 ENCOUNTER — Other Ambulatory Visit (HOSPITAL_COMMUNITY): Payer: Medicare Other

## 2018-09-17 ENCOUNTER — Ambulatory Visit (INDEPENDENT_AMBULATORY_CARE_PROVIDER_SITE_OTHER): Payer: Medicare Other | Admitting: *Deleted

## 2018-09-17 ENCOUNTER — Telehealth (HOSPITAL_COMMUNITY): Payer: Self-pay | Admitting: *Deleted

## 2018-09-17 DIAGNOSIS — I442 Atrioventricular block, complete: Secondary | ICD-10-CM | POA: Diagnosis not present

## 2018-09-17 LAB — CUP PACEART REMOTE DEVICE CHECK
Battery Remaining Longevity: 113 mo
Battery Voltage: 3.01 V
Brady Statistic AP VP Percent: 5.3 %
Brady Statistic AP VS Percent: 0 %
Brady Statistic AS VP Percent: 94.42 %
Brady Statistic AS VS Percent: 0.27 %
Brady Statistic RA Percent Paced: 5.43 %
Brady Statistic RV Percent Paced: 99.73 %
Date Time Interrogation Session: 20200608113603
Implantable Lead Implant Date: 20180827
Implantable Lead Implant Date: 20180827
Implantable Lead Location: 753859
Implantable Lead Location: 753860
Implantable Lead Model: 4076
Implantable Lead Model: 4076
Implantable Pulse Generator Implant Date: 20180827
Lead Channel Impedance Value: 342 Ohm
Lead Channel Impedance Value: 380 Ohm
Lead Channel Impedance Value: 399 Ohm
Lead Channel Impedance Value: 437 Ohm
Lead Channel Pacing Threshold Amplitude: 0.75 V
Lead Channel Pacing Threshold Amplitude: 0.875 V
Lead Channel Pacing Threshold Pulse Width: 0.4 ms
Lead Channel Pacing Threshold Pulse Width: 0.4 ms
Lead Channel Sensing Intrinsic Amplitude: 4.375 mV
Lead Channel Sensing Intrinsic Amplitude: 4.375 mV
Lead Channel Sensing Intrinsic Amplitude: 6.625 mV
Lead Channel Sensing Intrinsic Amplitude: 6.625 mV
Lead Channel Setting Pacing Amplitude: 1.5 V
Lead Channel Setting Pacing Amplitude: 2.5 V
Lead Channel Setting Pacing Pulse Width: 0.4 ms
Lead Channel Setting Sensing Sensitivity: 4 mV

## 2018-09-17 NOTE — Telephone Encounter (Signed)
COVID-19 Pre-Screening Questions:  . Do you currently have a fever? No (yes = cancel and refer to pcp for e-visit) . Have you recently travelled on a cruise, internationally, or to [place], [place], [place], [place], [place], or [place], [place] ([place]) ? No (yes = cancel, stay home, monitor symptoms, and contact pcp or initiate e-visit if symptoms develop) . Have you been in contact with someone that is currently pending confirmation of Covid19 testing or has been confirmed to have the Covid19 virus?  No (yes = cancel, stay home, away from tested individual, monitor symptoms, and contact pcp or initiate e-visit if symptoms develop) . Are you currently experiencing fatigue or cough? No (yes = pt should be prepared to have a mask placed at the time of their visit).   . Reiterated no additional visitors. . Arrive no earlier than 15 minutes before appointment time. . Please bring own mask.  Jeffery [name] 

## 2018-09-18 ENCOUNTER — Ambulatory Visit (HOSPITAL_COMMUNITY): Payer: Medicare Other | Attending: Internal Medicine

## 2018-09-18 ENCOUNTER — Other Ambulatory Visit: Payer: Self-pay

## 2018-09-18 DIAGNOSIS — R0602 Shortness of breath: Secondary | ICD-10-CM | POA: Insufficient documentation

## 2018-09-25 NOTE — Progress Notes (Signed)
Remote pacemaker transmission.   

## 2018-09-28 ENCOUNTER — Other Ambulatory Visit: Payer: Self-pay | Admitting: Rheumatology

## 2018-09-28 DIAGNOSIS — M0609 Rheumatoid arthritis without rheumatoid factor, multiple sites: Secondary | ICD-10-CM

## 2018-10-01 ENCOUNTER — Telehealth: Payer: Self-pay | Admitting: Cardiology

## 2018-10-01 NOTE — Telephone Encounter (Addendum)
Patient no longer on Rasuvo. Patient on MTX vial and syringe now.

## 2018-10-01 NOTE — Telephone Encounter (Signed)
Video visit/my chart/[phone]/pre reg complete/consent obtained -- ttf

## 2018-10-07 NOTE — Progress Notes (Signed)
Virtual Visit via Video Note   This visit type was conducted due to national recommendations for restrictions regarding the COVID-19 Pandemic (e.g. social distancing) in an effort to limit this patient's exposure and mitigate transmission in our community.  Due to his co-morbid illnesses, this patient is at least at moderate risk for complications without adequate follow up.  This format is felt to be most appropriate for this patient at this time.  All issues noted in this document were discussed and addressed.  A limited physical exam was performed with this format.  Please refer to the patient's chart for his consent to telehealth for [facility].   Date:  10/08/2018   ID:  Jeffery [name], Jeffery [name] 29-Oct-1934, MRN [id number]  Patient Location: Home Provider Location: Home  PCP:  Shon Baton, MD  Cardiologist:  Minus Breeding, MD  Electrophysiologist:  None   Evaluation Performed:  Follow-Up Visit  Chief Complaint:  Syncope  History of Present Illness:    Jeffery [name] is a 84 y.o. male for follow up of third degree HB.  He has a pacemaker per Dr. Rayann Heman.  He had a follow up echo earlier this month.  He has mildly enlarged aortic root.    He is very active and he feels well.  The patient denies any new symptoms such as chest discomfort, neck or arm discomfort. There has been no new shortness of breath, PND or orthopnea. There have been no reported palpitations, presyncope or syncope.  [place]    The patient does not have symptoms concerning for COVID-19 infection (fever, chills, cough, or new shortness of breath).    Past Medical History:  Diagnosis Date  . Arthritis   . BCC (basal cell carcinoma)   . BPH (benign prostatic hypertrophy) 2008  . Cancer [facility]) 2008   Prostate  . Complete heart block ([place])   . Complex sleep apnea syndrome    does not use CPAP  . Diabetes mellitus without complication ([place])   . Diverticulosis   . ED (erectile  dysfunction)   . GERD (gastroesophageal reflux disease)   . Hemorrhoids   . Hernia, inguinal   . Hyperlipidemia   . IFG (impaired fasting glucose)   . Osteopenia   . Peptic ulcer disease with hemorrhage 1993  . Shingles   . Vitamin D deficiency    Past Surgical History:  Procedure Laterality Date  . BACK SURGERY N/A 1984  . CATARACT EXTRACTION, BILATERAL    . HERNIA REPAIR    . LAMINECTOMY  1984   L 5  . PACEMAKER IMPLANT  12/05/2016   MDT Azure XT DR MRI conditional PPM implanted for CHB by Dr Vista Deck in [place]  . PACEMAKER INSERTION  12/05/2016  . PROSTATE SURGERY  12/2006   Robotic  . TONSILLECTOMY  84 years old  . TRIGGER FINGER RELEASE  03/01/2012   Procedure: MINOR RELEASE TRIGGER FINGER/A-1 PULLEY;  Surgeon: Cammie Sickle., MD;  Location: [facility];  Service: Orthopedics;  Laterality: Left;  long finger  . TRIGGER FINGER RELEASE Left 02/08/2018   Procedure: RELEASE TRIGGER FINGER/A-1 PULLEY LEFT RING FINGER;  Surgeon: Daryll Brod, MD;  Location: [facility];  Service: Orthopedics;  Laterality: Left;    Prior to Admission medications   Medication Sig Start Date End Date Taking? Authorizing Provider  acetaminophen (TYLENOL) 500 MG tablet Take 500 mg by mouth every 6 (six) hours as needed.   Yes  [provider]  Calcium Carbonate (CALCIUM 600 PO) Take 600 mg by mouth daily.   Yes [provider]  Cholecalciferol (VITAMIN D) 2000 units CAPS Take by mouth daily.   Yes [provider]  Coenzyme Q10 200 MG capsule Take 200 mg by mouth 2 (two) times daily.    Yes [provider]  colesevelam (WELCHOL) 625 MG tablet Take 1,250 mg by mouth 3 (three) times daily.    Yes [provider]  D3-50 50000 units capsule TAKE 1 CAPSULE BY MOUTH ONCE A MONTH 11/07/17  Yes Deveshwar, Abel Presto, MD  diclofenac sodium (VOLTAREN) 1 % GEL Apply 3 grams to 3 large joints, up to 3 times daily. Patient taking differently: Apply  3 grams to 3 large joints, up to 3 times daily prn 07/20/17  Yes Deveshwar, Abel Presto, MD  folic acid (FOLVITE) 1 MG tablet TAKE 1 TABLET EVERY DAY 07/16/18  Yes Deveshwar, Abel Presto, MD  hydroxychloroquine (PLAQUENIL) 200 MG tablet TAKE 1 TABLET TWICE DAILY 07/16/18  Yes Deveshwar, Abel Presto, MD  metFORMIN (GLUCOPHAGE) 500 MG tablet Take 500 mg daily by mouth.    Yes [provider]  methotrexate 50 MG/2ML injection Inject 0.6 mLs (15 mg total) into the skin once a week. 09/06/18  Yes Deveshwar, Abel Presto, MD  omega-3 acid ethyl esters (LOVAZA) 1 g capsule TAKE 1 CAPSULE BY MOUTH 3 TIMES A WEEK 08/03/18  Yes Deveshwar, Abel Presto, MD  ONE TOUCH ULTRA TEST test strip TEST BLOOD SUGAR BID AS DIRECTED 09/06/17  Yes [provider]  TUBERCULIN SYR 1CC/27GX1/2" (B-D TB SYRINGE 1CC/27GX1/2") 27G X 1/2" 1 ML MISC 12 Syringes by Does not apply route once a week. 09/06/18  Yes Deveshwar, Abel Presto, MD  vitamin E 100 UNIT capsule Take 100 Units by mouth daily.   Yes [provider]     Allergies:   Sulfasalazine, Demerol [meperidine], Garlic, Other, and Sulfa antibiotics   Social History   Tobacco Use  . Smoking status: Never Smoker  . Smokeless tobacco: Never Used  Substance Use Topics  . Alcohol use: Not Currently    Frequency: Never  . Drug use: No     Family Hx: The patient's family history includes Congenital heart disease in his brother; Heart failure in his mother.  ROS:   Please see the history of present illness.    As stated in the HPI and negative for all other systems.    Prior CV studies:   The following studies were reviewed today:  Echo  Labs/Other Tests and Data Reviewed:    EKG:  No ECG reviewed.  Recent Labs: 04/18/2018: ALT 13; BUN 20; Creat 0.77; Hemoglobin 14.6; Platelets 303; Potassium 4.4; Sodium 141   Recent Lipid Panel No results found for: CHOL, TRIG, HDL, CHOLHDL, LDLCALC, LDLDIRECT  Wt Readings from Last 3 Encounters:  10/08/18 170 lb (77.1 kg)   09/06/18 173 lb (78.5 kg)  03/19/18 166 lb 6.4 oz (75.5 kg)     Objective:    Vital Signs:  BP (!) 149/64   Pulse 79   Ht 6\' 2"  (1.88 m)   Wt 170 lb (77.1 kg)   BMI 21.83 kg/m    VITAL SIGNS:  reviewed GEN:  no acute distress EYES:  sclerae anicteric, EOMI - Extraocular Movements Intact NEURO:  alert and oriented x 3, no obvious focal deficit PSYCH:  normal affect  ASSESSMENT & PLAN:    CHB:  He feels well and has had no further syncope.  He is up to  date with follow up.    SLEEP APNEA:  He is having this treated   HTN:  This ready is an aberration.  His blood pressure is usually well controlled.  No change in therapy.  DYSLIPIDEMIA:   We discussed his lipids.  His LDL is slightly elevated but he would not want take a statin.  No change in therapy.  AORTIC ROOT DILATATION.  This is mildly elevated.  I will probably consider following this with an echo in the future he and I talked about it.  I do not think further imaging scheduling is necessary.   COVID-19 Education: The signs and symptoms of COVID-19 were discussed with the patient and how to seek care for testing (follow up with PCP or arrange E-visit).   The importance of social distancing was discussed today.  Time:   Today, I have spent  1 minutes with the patient with telehealth technology discussing the above problems.     Medication Adjustments/Labs and Tests Ordered: Current medicines are reviewed at length with the patient today.  Concerns regarding medicines are outlined above.   Tests Ordered: No orders of the defined types were placed in this encounter.   Medication Changes: No orders of the defined types were placed in this encounter.    Follow Up:  Virtual Visit or In Person in about 18 months  Signed, Minus Breeding, MD  10/08/2018 11:51 AM    [place]

## 2018-10-08 ENCOUNTER — Encounter: Payer: Self-pay | Admitting: *Deleted

## 2018-10-08 ENCOUNTER — Telehealth (INDEPENDENT_AMBULATORY_CARE_PROVIDER_SITE_OTHER): Payer: Medicare Other | Admitting: Cardiology

## 2018-10-08 ENCOUNTER — Encounter: Payer: Self-pay | Admitting: Cardiology

## 2018-10-08 VITALS — BP 149/64 | HR 79 | Ht 74.0 in | Wt 170.0 lb

## 2018-10-08 DIAGNOSIS — I77810 Thoracic aortic ectasia: Secondary | ICD-10-CM | POA: Diagnosis not present

## 2018-10-08 DIAGNOSIS — Z7189 Other specified counseling: Secondary | ICD-10-CM

## 2018-10-08 DIAGNOSIS — E785 Hyperlipidemia, unspecified: Secondary | ICD-10-CM

## 2018-10-08 DIAGNOSIS — I3481 Nonrheumatic mitral (valve) annulus calcification: Secondary | ICD-10-CM

## 2018-10-08 DIAGNOSIS — I059 Rheumatic mitral valve disease, unspecified: Secondary | ICD-10-CM

## 2018-10-08 NOTE — Patient Instructions (Signed)
Medication Instructions:  Your physician recommends that you continue on your current medications as directed. Please refer to the Current Medication list given to you today.  If you need a refill on your cardiac medications before your next appointment, please call your pharmacy.   Lab work: NONE  Testing/Procedures: NONE  Follow-Up: At [facility], you and your health needs are our priority.  As part of our continuing mission to provide you with exceptional heart care, we have created designated Provider Care Teams.  These Care Teams include your primary Cardiologist (physician) and Advanced Practice Providers (APPs -  Physician Assistants and Nurse Practitioners) who all work together to provide you with the care you need, when you need it. You will need a follow up appointment in 18 months.  Please call our office 2 months in advance to schedule this appointment.  You may see Minus Breeding, MD or one of the following Advanced Practice Providers on your designated Care Team:   Rosaria Ferries, PA-C . Jory Sims, DNP, ANP

## 2018-11-01 ENCOUNTER — Other Ambulatory Visit: Payer: Self-pay | Admitting: Rheumatology

## 2018-11-01 NOTE — Telephone Encounter (Signed)
Ok to refill 

## 2018-11-01 NOTE — Telephone Encounter (Signed)
Last Visit: 09/06/18 Next Visit: 02/19/19  Okay to refill Vitamin D?

## 2018-12-03 ENCOUNTER — Other Ambulatory Visit: Payer: Self-pay | Admitting: Rheumatology

## 2018-12-03 DIAGNOSIS — M0609 Rheumatoid arthritis without rheumatoid factor, multiple sites: Secondary | ICD-10-CM

## 2018-12-04 NOTE — Telephone Encounter (Signed)
Last Visit: 09/06/18 Next Visit: 11/120  Labs: 08/23/18 RDW 11.6, MPV 5.9 Glucose 119 Eye exam: 09/11/18 WNL  Okay to refill per Dr. Estanislado Pandy

## 2018-12-06 ENCOUNTER — Other Ambulatory Visit: Payer: Self-pay | Admitting: *Deleted

## 2018-12-06 ENCOUNTER — Telehealth: Payer: Self-pay | Admitting: Rheumatology

## 2018-12-06 DIAGNOSIS — Z79899 Other long term (current) drug therapy: Secondary | ICD-10-CM

## 2018-12-06 NOTE — Telephone Encounter (Signed)
Lab Orders released.  

## 2018-12-06 NOTE — Telephone Encounter (Signed)
Patient called requesting his labwork orders be faxed to [facility] in [place].  Patient states he will be going tomorrow 12/07/18.  Fax [phone]

## 2018-12-18 ENCOUNTER — Ambulatory Visit (INDEPENDENT_AMBULATORY_CARE_PROVIDER_SITE_OTHER): Payer: Medicare Other | Admitting: *Deleted

## 2018-12-18 DIAGNOSIS — I442 Atrioventricular block, complete: Secondary | ICD-10-CM

## 2018-12-18 LAB — CUP PACEART REMOTE DEVICE CHECK
Battery Remaining Longevity: 110 mo
Battery Voltage: 3.01 V
Brady Statistic AP VP Percent: 5.42 %
Brady Statistic AP VS Percent: 0 %
Brady Statistic AS VP Percent: 94.49 %
Brady Statistic AS VS Percent: 0.09 %
Brady Statistic RA Percent Paced: 5.41 %
Brady Statistic RV Percent Paced: 99.91 %
Date Time Interrogation Session: 20200908134039
Implantable Lead Implant Date: 20180827
Implantable Lead Implant Date: 20180827
Implantable Lead Location: 753859
Implantable Lead Location: 753860
Implantable Lead Model: 4076
Implantable Lead Model: 4076
Implantable Pulse Generator Implant Date: 20180827
Lead Channel Impedance Value: 323 Ohm
Lead Channel Impedance Value: 361 Ohm
Lead Channel Impedance Value: 418 Ohm
Lead Channel Impedance Value: 437 Ohm
Lead Channel Pacing Threshold Amplitude: 0.625 V
Lead Channel Pacing Threshold Amplitude: 0.875 V
Lead Channel Pacing Threshold Pulse Width: 0.4 ms
Lead Channel Pacing Threshold Pulse Width: 0.4 ms
Lead Channel Sensing Intrinsic Amplitude: 4.25 mV
Lead Channel Sensing Intrinsic Amplitude: 4.25 mV
Lead Channel Sensing Intrinsic Amplitude: 6.625 mV
Lead Channel Sensing Intrinsic Amplitude: 6.625 mV
Lead Channel Setting Pacing Amplitude: 1.5 V
Lead Channel Setting Pacing Amplitude: 2.5 V
Lead Channel Setting Pacing Pulse Width: 0.4 ms
Lead Channel Setting Sensing Sensitivity: 4 mV

## 2018-12-27 LAB — CBC WITH DIFFERENTIAL/PLATELET
Basophils Absolute: 0 10*3/uL (ref 0.0–0.2)
Basos: 0 %
EOS (ABSOLUTE): 0.1 10*3/uL (ref 0.0–0.4)
Eos: 2 %
Hematocrit: 43.9 % (ref 37.5–51.0)
Hemoglobin: 15.1 g/dL (ref 13.0–17.7)
Immature Grans (Abs): 0 10*3/uL (ref 0.0–0.1)
Immature Granulocytes: 0 %
Lymphocytes Absolute: 1.8 10*3/uL (ref 0.7–3.1)
Lymphs: 32 %
MCH: 31.4 pg (ref 26.6–33.0)
MCHC: 34.4 g/dL (ref 31.5–35.7)
MCV: 91 fL (ref 79–97)
Monocytes Absolute: 0.6 10*3/uL (ref 0.1–0.9)
Monocytes: 11 %
Neutrophils Absolute: 3 10*3/uL (ref 1.4–7.0)
Neutrophils: 55 %
Platelets: 254 10*3/uL (ref 150–450)
RBC: 4.81 x10E6/uL (ref 4.14–5.80)
RDW: 12.5 % (ref 11.6–15.4)
WBC: 5.4 10*3/uL (ref 3.4–10.8)

## 2018-12-27 LAB — CMP14+EGFR
ALT: 18 IU/L (ref 0–44)
AST: 7 IU/L (ref 0–40)
Albumin/Globulin Ratio: 2 (ref 1.2–2.2)
Albumin: 4.6 g/dL (ref 3.6–4.6)
Alkaline Phosphatase: 76 IU/L (ref 39–117)
BUN/Creatinine Ratio: 20 (ref 10–24)
BUN: 14 mg/dL (ref 8–27)
Bilirubin Total: 0.9 mg/dL (ref 0.0–1.2)
CO2: 26 mmol/L (ref 20–29)
Calcium: 9.6 mg/dL (ref 8.6–10.2)
Chloride: 103 mmol/L (ref 96–106)
Creatinine, Ser: 0.69 mg/dL — ABNORMAL LOW (ref 0.76–1.27)
GFR calc Af Amer: 102 mL/min/{1.73_m2} (ref >59)
GFR calc non Af Amer: 88 mL/min/{1.73_m2} (ref >59)
Globulin, Total: 2.3 g/dL (ref 1.5–4.5)
Glucose: 103 mg/dL — ABNORMAL HIGH (ref 65–99)
Potassium: 4.8 mmol/L (ref 3.5–5.2)
Sodium: 142 mmol/L (ref 134–144)
Total Protein: 6.9 g/dL (ref 6.0–8.5)

## 2018-12-27 NOTE — Progress Notes (Signed)
Glucose is borderline elevated due to nonfasting sample.  All other lab results are within normal limits.

## 2019-01-02 ENCOUNTER — Encounter: Payer: Self-pay | Admitting: Cardiology

## 2019-01-02 ENCOUNTER — Other Ambulatory Visit: Payer: Self-pay | Admitting: Rheumatology

## 2019-01-02 DIAGNOSIS — Z79899 Other long term (current) drug therapy: Secondary | ICD-10-CM

## 2019-01-02 NOTE — Progress Notes (Signed)
Remote pacemaker transmission.   

## 2019-01-02 NOTE — Telephone Encounter (Signed)
Last Visit: 09/06/18 Next Visit: 11/120         Labs: 12/26/18 elevated glucose all other labs wnl  Okay to refill per Dr. Estanislado Pandy

## 2019-01-16 ENCOUNTER — Ambulatory Visit: Payer: Self-pay | Admitting: Neurology

## 2019-01-25 ENCOUNTER — Other Ambulatory Visit: Payer: Self-pay | Admitting: *Deleted

## 2019-01-25 MED ORDER — DICLOFENAC SODIUM 1 % TD GEL: TRANSDERMAL | 2 refills | Status: DC

## 2019-01-25 NOTE — Telephone Encounter (Signed)
Refill request received via fax  Last Visit: 09/06/18 Next Visit:02/10/19  Okay to refill per Dr. Estanislado Pandy

## 2019-02-05 NOTE — Progress Notes (Deleted)
Office Visit Note  Patient: Gianpaolo Lillian Benne             Date of Birth: 1934/08/01           MRN: [id number]             PCP: Shon Baton, MD Referring: Shon Baton, MD Visit Date: 02/19/2019 Occupation: @GUAROCC @  Subjective:  No chief complaint on file.   History of Present Illness: Belen H O'Tuel [name] is a 84 y.o. male ***   Activities of Daily Living:  Patient reports morning stiffness for *** {minute/hour:19697}.   Patient {ACTIONS;DENIES/REPORTS:21021675::"Denies"} nocturnal pain.  Difficulty dressing/grooming: {ACTIONS;DENIES/REPORTS:21021675::"Denies"} Difficulty climbing stairs: {ACTIONS;DENIES/REPORTS:21021675::"Denies"} Difficulty getting out of chair: {ACTIONS;DENIES/REPORTS:21021675::"Denies"} Difficulty using hands for taps, buttons, cutlery, and/or writing: {ACTIONS;DENIES/REPORTS:21021675::"Denies"}  No Rheumatology ROS completed.   PMFS History:  Patient Active Problem List   Diagnosis Date Noted  . Educated about COVID-19 virus infection 10/08/2018  . Aortic root dilatation ([place]) 10/08/2018  . Mitral annular calcification 10/08/2018  . Neuropathy 08/21/2017  . Hydroxychloroquine causing adverse effect in therapeutic use 08/21/2017  . Symptomatic bradycardia 02/23/2017  . Status cardiac pacemaker 02/23/2017  . CSA (central sleep apnea) 02/23/2017  . Heart block AV complete ([place]) 01/18/2017  . Vitamin D deficiency 02/08/2016  . Trigger finger, left middle finger 02/08/2016  . High risk medication use 02/08/2016  . Bursitis of right shoulder 02/08/2016  . DDD (degenerative disc disease), lumbar 02/08/2016  . DDD (degenerative disc disease), thoracic 02/08/2016  . Rheumatoid arthritis of multiple sites without rheumatoid factor ([place]) 11/28/2015    Class: Chronic  . Other secondary osteoarthritis of both hands 11/28/2015    Class: Chronic  . PMR (polymyalgia rheumatica) ([facility]) 11/28/2015    Class: Chronic  . Osteoporosis 11/28/2015    Class: Chronic   . Prostate cancer ([place]) 11/28/2015    Class: History of  . Duodenal ulcer 11/28/2015    Class: Chronic  . High cholesterol 11/28/2015  . Dizziness 07/16/2015  . Incomplete RBBB 03/31/2015  . Sleep apnea, primary central 03/03/2015  . Polyneuropathy ([place]) 01/24/2013  . Right inguinal hernia 07/05/2012    Past Medical History:  Diagnosis Date  . Arthritis   . BCC (basal cell carcinoma)   . BPH (benign prostatic hypertrophy) 2008  . Cancer [facility]) 2008   Prostate  . Complete heart block ([place])   . Complex sleep apnea syndrome    does not use CPAP  . Diabetes mellitus without complication ([place])   . Diverticulosis   . ED (erectile dysfunction)   . GERD (gastroesophageal reflux disease)   . Hemorrhoids   . Hernia, inguinal   . Hyperlipidemia   . IFG (impaired fasting glucose)   . Osteopenia   . Peptic ulcer disease with hemorrhage 1993  . Shingles   . Vitamin D deficiency     Family History  Problem Relation Age of Onset  . Heart failure Mother        Died age 23.  Apparently had valve surgery.   . Congenital heart disease Brother    Past Surgical History:  Procedure Laterality Date  . BACK SURGERY N/A 1984  . CATARACT EXTRACTION, BILATERAL    . HERNIA REPAIR    . LAMINECTOMY  1984   L 5  . PACEMAKER IMPLANT  12/05/2016   MDT Azure XT DR MRI conditional PPM implanted for CHB by Dr Vista Deck in [place]  . PACEMAKER INSERTION  12/05/2016  . PROSTATE SURGERY  12/2006   Robotic  .  TONSILLECTOMY  84 years old  . TRIGGER FINGER RELEASE  03/01/2012   Procedure: MINOR RELEASE TRIGGER FINGER/A-1 PULLEY;  Surgeon: Cammie Sickle., MD;  Location: [facility];  Service: Orthopedics;  Laterality: Left;  long finger  . TRIGGER FINGER RELEASE Left 02/08/2018   Procedure: RELEASE TRIGGER FINGER/A-1 PULLEY LEFT RING FINGER;  Surgeon: Daryll Brod, MD;  Location: [facility];  Service: Orthopedics;  Laterality: Left;   Social History   Social History  Narrative   Right handed, Retired .Married, 2 kids. Caffeine none.   3 Step    Immunization History  Administered Date(s) Administered  . Influenza, High Dose Seasonal PF 03/03/2017, 01/26/2018     Objective: Vital Signs: There were no vitals taken for this visit.   Physical Exam   Musculoskeletal Exam: ***  CDAI Exam: CDAI Score: - Patient Global: -; Provider Global: - Swollen: -; Tender: - Joint Exam   No joint exam has been documented for this visit   There is currently no information documented on the homunculus. Go to the Rheumatology activity and complete the homunculus joint exam.  Investigation: No additional findings.  Imaging: No results found.  Recent Labs: Lab Results  Component Value Date   WBC 5.4 12/26/2018   HGB 15.1 12/26/2018   PLT 254 12/26/2018   NA 142 12/26/2018   K 4.8 12/26/2018   CL 103 12/26/2018   CO2 26 12/26/2018   GLUCOSE 103 (H) 12/26/2018   BUN 14 12/26/2018   CREATININE 0.69 (L) 12/26/2018   BILITOT 0.9 12/26/2018   ALKPHOS 76 12/26/2018   AST 7 12/26/2018   ALT 18 12/26/2018   PROT 6.9 12/26/2018   ALBUMIN 4.6 12/26/2018   CALCIUM 9.6 12/26/2018   GFRAA 102 12/26/2018    Speciality Comments: PLQ Eye Exam: 09/11/2018 @ [facility]  Procedures:  No procedures performed Allergies: Sulfasalazine, Demerol [meperidine], Garlic, Other, and Sulfa antibiotics   Assessment / Plan:     Visit Diagnoses: No diagnosis found.  Orders: No orders of the defined types were placed in this encounter.  No orders of the defined types were placed in this encounter.   Face-to-face time spent with patient was *** minutes. Greater than 50% of time was spent in counseling and coordination of care.  Follow-Up Instructions: No follow-ups on file.   Earnestine Mealing, CMA  Note - This record has been created using Editor, commissioning.  Chart creation errors have been sought, but may not always  have been located. Such creation errors do  not reflect on  the standard of medical care.

## 2019-02-19 ENCOUNTER — Ambulatory Visit: Payer: Medicare Other | Admitting: Rheumatology

## 2019-02-27 NOTE — Progress Notes (Deleted)
Office Visit Note  Patient: Jeffery [name]             Date of Birth: 1935-01-12           MRN: [id number]             PCP: Shon Baton, MD Referring: Shon Baton, MD Visit Date: 03/13/2019 Occupation: @GUAROCC @  Subjective:  No chief complaint on file.   History of Present Illness: Jeffery [name] is a 84 y.o. male ***   Activities of Daily Living:  Patient reports morning stiffness for *** {minute/hour:19697}.   Patient {ACTIONS;DENIES/REPORTS:21021675::"Denies"} nocturnal pain.  Difficulty dressing/grooming: {ACTIONS;DENIES/REPORTS:21021675::"Denies"} Difficulty climbing stairs: {ACTIONS;DENIES/REPORTS:21021675::"Denies"} Difficulty getting out of chair: {ACTIONS;DENIES/REPORTS:21021675::"Denies"} Difficulty using hands for taps, buttons, cutlery, and/or writing: {ACTIONS;DENIES/REPORTS:21021675::"Denies"}  No Rheumatology ROS completed.   PMFS History:  Patient Active Problem List   Diagnosis Date Noted  . Educated about COVID-19 virus infection 10/08/2018  . Aortic root dilatation ([place]) 10/08/2018  . Mitral annular calcification 10/08/2018  . Neuropathy 08/21/2017  . Hydroxychloroquine causing adverse effect in therapeutic use 08/21/2017  . Symptomatic bradycardia 02/23/2017  . Status cardiac pacemaker 02/23/2017  . CSA (central sleep apnea) 02/23/2017  . Heart block AV complete ([place]) 01/18/2017  . Vitamin D deficiency 02/08/2016  . Trigger finger, left middle finger 02/08/2016  . High risk medication use 02/08/2016  . Bursitis of right shoulder 02/08/2016  . DDD (degenerative disc disease), lumbar 02/08/2016  . DDD (degenerative disc disease), thoracic 02/08/2016  . Rheumatoid arthritis of multiple sites without rheumatoid factor ([place]) 11/28/2015    Class: Chronic  . Other secondary osteoarthritis of both hands 11/28/2015    Class: Chronic  . PMR (polymyalgia rheumatica) (HCC) 11/28/2015    Class: Chronic  . Osteoporosis 11/28/2015    Class: Chronic   . Prostate cancer ([place]) 11/28/2015    Class: History of  . Duodenal ulcer 11/28/2015    Class: Chronic  . High cholesterol 11/28/2015  . Dizziness 07/16/2015  . Incomplete RBBB 03/31/2015  . Sleep apnea, primary central 03/03/2015  . Polyneuropathy ([place]) 01/24/2013  . Right inguinal hernia 07/05/2012    Past Medical History:  Diagnosis Date  . Arthritis   . BCC (basal cell carcinoma)   . BPH (benign prostatic hypertrophy) 2008  . Cancer [facility]) 2008   Prostate  . Complete heart block ([place])   . Complex sleep apnea syndrome    does not use CPAP  . Diabetes mellitus without complication ([place])   . Diverticulosis   . ED (erectile dysfunction)   . GERD (gastroesophageal reflux disease)   . Hemorrhoids   . Hernia, inguinal   . Hyperlipidemia   . IFG (impaired fasting glucose)   . Osteopenia   . Peptic ulcer disease with hemorrhage 1993  . Shingles   . Vitamin D deficiency     Family History  Problem Relation Age of Onset  . Heart failure Mother        Died age 16.  Apparently had valve surgery.   . Congenital heart disease Brother    Past Surgical History:  Procedure Laterality Date  . BACK SURGERY N/A 1984  . CATARACT EXTRACTION, BILATERAL    . HERNIA REPAIR    . LAMINECTOMY  1984   L 5  . PACEMAKER IMPLANT  12/05/2016   MDT Azure XT DR MRI conditional PPM implanted for CHB by Dr Vista Deck in [place]  . PACEMAKER INSERTION  12/05/2016  . PROSTATE SURGERY  12/2006   Robotic  .  TONSILLECTOMY  84 years old  . TRIGGER FINGER RELEASE  03/01/2012   Procedure: MINOR RELEASE TRIGGER FINGER/A-1 PULLEY;  Surgeon: Cammie Sickle., MD;  Location: [facility];  Service: Orthopedics;  Laterality: Left;  long finger  . TRIGGER FINGER RELEASE Left 02/08/2018   Procedure: RELEASE TRIGGER FINGER/A-1 PULLEY LEFT RING FINGER;  Surgeon: Daryll Brod, MD;  Location: [facility];  Service: Orthopedics;  Laterality: Left;   Social History   Social History  Narrative   Right handed, Retired .Married, 2 kids. Caffeine none.   3 Step    Immunization History  Administered Date(s) Administered  . Influenza, High Dose Seasonal PF 03/03/2017, 01/26/2018     Objective: Vital Signs: There were no vitals taken for this visit.   Physical Exam   Musculoskeletal Exam: ***  CDAI Exam: CDAI Score: - Patient Global: -; Provider Global: - Swollen: -; Tender: - Joint Exam   No joint exam has been documented for this visit   There is currently no information documented on the homunculus. Go to the Rheumatology activity and complete the homunculus joint exam.  Investigation: No additional findings.  Imaging: No results found.  Recent Labs: Lab Results  Component Value Date   WBC 5.4 12/26/2018   HGB 15.1 12/26/2018   PLT 254 12/26/2018   NA 142 12/26/2018   K 4.8 12/26/2018   CL 103 12/26/2018   CO2 26 12/26/2018   GLUCOSE 103 (H) 12/26/2018   BUN 14 12/26/2018   CREATININE 0.69 (L) 12/26/2018   BILITOT 0.9 12/26/2018   ALKPHOS 76 12/26/2018   AST 7 12/26/2018   ALT 18 12/26/2018   PROT 6.9 12/26/2018   ALBUMIN 4.6 12/26/2018   CALCIUM 9.6 12/26/2018   GFRAA 102 12/26/2018    Speciality Comments: PLQ Eye Exam: 09/11/2018 @ [facility]  Procedures:  No procedures performed Allergies: Sulfasalazine, Demerol [meperidine], Garlic, Other, and Sulfa antibiotics   Assessment / Plan:     Visit Diagnoses: No diagnosis found.  Orders: No orders of the defined types were placed in this encounter.  No orders of the defined types were placed in this encounter.   Face-to-face time spent with patient was *** minutes. Greater than 50% of time was spent in counseling and coordination of care.  Follow-Up Instructions: No follow-ups on file.   Earnestine Mealing, CMA  Note - This record has been created using Editor, commissioning.  Chart creation errors have been sought, but may not always  have been located. Such creation errors do  not reflect on  the standard of medical care.

## 2019-03-13 ENCOUNTER — Other Ambulatory Visit: Payer: Self-pay | Admitting: Internal Medicine

## 2019-03-13 ENCOUNTER — Ambulatory Visit: Payer: Medicare Other | Admitting: Rheumatology

## 2019-03-13 DIAGNOSIS — R140 Abdominal distension (gaseous): Secondary | ICD-10-CM

## 2019-03-13 DIAGNOSIS — R109 Unspecified abdominal pain: Secondary | ICD-10-CM

## 2019-03-15 ENCOUNTER — Telehealth: Payer: Self-pay

## 2019-03-15 DIAGNOSIS — M0609 Rheumatoid arthritis without rheumatoid factor, multiple sites: Secondary | ICD-10-CM

## 2019-03-15 MED ORDER — HYDROXYCHLOROQUINE SULFATE 200 MG PO TABS: 200.0000 mg | ORAL_TABLET | Freq: Two times a day (BID) | ORAL | 0 refills | Status: DC

## 2019-03-15 NOTE — Telephone Encounter (Signed)
Refill request received via fax from [facility] in [place], [place].   Last Visit: 09/06/2018 Next Visit: 03/20/2019 Labs: 12/26/2018 Glucose is borderline elevated due to nonfasting sample. All other lab results are within normal limits. Eye exam: 09/11/2018  Okay to refill per Dr. Estanislado Pandy.

## 2019-03-18 ENCOUNTER — Telehealth: Payer: Self-pay | Admitting: Rheumatology

## 2019-03-18 DIAGNOSIS — Z79899 Other long term (current) drug therapy: Secondary | ICD-10-CM

## 2019-03-18 NOTE — Telephone Encounter (Signed)
Patient needs lab orders to sent [facility] in [place]. Fax# [phone].

## 2019-03-18 NOTE — Telephone Encounter (Signed)
Lab orders have been released for [facility].

## 2019-03-19 ENCOUNTER — Encounter: Payer: Self-pay | Admitting: Neurology

## 2019-03-19 ENCOUNTER — Other Ambulatory Visit: Payer: Self-pay | Admitting: Rheumatology

## 2019-03-19 ENCOUNTER — Ambulatory Visit (INDEPENDENT_AMBULATORY_CARE_PROVIDER_SITE_OTHER): Payer: Medicare Other | Admitting: *Deleted

## 2019-03-19 ENCOUNTER — Telehealth: Payer: Self-pay | Admitting: Rheumatology

## 2019-03-19 DIAGNOSIS — I442 Atrioventricular block, complete: Secondary | ICD-10-CM

## 2019-03-19 NOTE — Telephone Encounter (Signed)
Patient had his lab work done in [place] yesterday, and would like for you to look out for results to be faxed hopefully by his appointment tomorrow 03/20/2019 at 11:15 am.

## 2019-03-19 NOTE — Progress Notes (Signed)
Virtual Visit via Video Note  I connected with Jeffery [name] on 03/20/19 at 11:15 AM EST by a video enabled telemedicine application and verified that I am speaking with the correct person using two identifiers.  Location: Patient: Home  Provider: Clinic  This service was conducted via virtual visit.  Both audio and visual tools were used.  The patient was located at home. I was located in my office.  Consent was obtained prior to the virtual visit and is aware of possible charges through their insurance for this visit.  The patient is an established patient.  Dr. Estanislado Pandy, MD conducted the virtual visit and Jeffery Sams, PA-C acted as scribe during the service.  Office staff helped with scheduling follow up visits after the service was conducted.     I discussed the limitations of evaluation and management by telemedicine and the availability of in person appointments. The patient expressed understanding and agreed to proceed.  CC: LUQ abdominal pain History of Present Illness: Patient is a 84 year old male with a past medical history of seronegative rheumatoid arthritis, PMR, osteoarthritis and DDD.  He is taking Plaquenil 200 mg 1 tablet BID and MTX 0.6 ml sq once weekly.  He has not had any recent RA flares. He denies any joint pain or joint swelling currently. He has persistent morning stiffness.  He has intermittent pain in his neck and lower back.  He denies any recent PMR flares.   He states 2 weeks ago he started having LUQ pain. He is unsure if the discomfort was related to stretching exercises he was performing prior.   He was evaluated by his PCP who recommended scheduling an ultrasound.  He used to see Dr. Earlean Shawl but has not seen him recently. He has not had any recent lab work. He was advised to hold MTX and metformin for 1 week. He has restarted MTX. He has not noticed any appetite changes, weight loss, or fevers. He has had normal bowel movements and no nausea or vomiting. The  discomfort is reproducible if he presses on the LUQ. He has remained active.   Review of Systems  Constitutional: Positive for malaise/fatigue. Negative for fever.  Eyes: Negative for photophobia, pain, discharge and redness.  Respiratory: Negative for cough, shortness of breath and wheezing.   Cardiovascular: Negative for chest pain and palpitations.  Gastrointestinal: Positive for abdominal pain (LUQ). Negative for blood in stool, constipation and diarrhea.  Genitourinary: Negative for dysuria.  Musculoskeletal: Positive for back pain and neck pain. Negative for joint pain and myalgias.       +Morning stiffness   Skin: Negative for rash.  Neurological: Negative for dizziness and headaches.  Psychiatric/Behavioral: Negative for depression. The patient is not nervous/anxious and does not have insomnia.       Observations/Objective: Physical Exam  Constitutional: He is oriented to person, place, and time and well-developed, well-nourished, and in no distress.  HENT:  Head: Normocephalic and atraumatic.  Eyes: Conjunctivae are normal.  Pulmonary/Chest: Effort normal.  Neurological: He is alert and oriented to person, place, and time.  Psychiatric: Mood, memory, affect and judgment normal.   Patient reports morning stiffness for 1  hour.   Patient reports nocturnal pain.  Difficulty dressing/grooming: Denies Difficulty climbing stairs: Denies Difficulty getting out of chair: Denies Difficulty using hands for taps, buttons, cutlery, and/or writing: Denies   Assessment and Plan: Visit Diagnoses: Rheumatoid arthritis of multiple sites without rheumatoid factor (HCC) - RF negative, CCP negative, 1433 eta  negative, ANA negative: He has not had any recent rheumatoid arthritis flares.  He has no joint pain or joint swelling at this time.  He has morning stiffness for about 1 hour daily.  He is clinically doing well on Plaquenil 200 mg 1 tablet by mouth twice daily, MTX 0.6 ml sq once  weekly, and folic acid 1 mg po daily.  He has been experiencing LUQ abdominal pain for the past 2 weeks, and he was evaluated by his PCPC who recommended scheduling an abdominal ultrasound.  He held MTX for 1 week but did not notice any change in his symptoms, so he has resumed MTX 0.6 ml sq once weekly.  He does not need any refills at this time.  He was advised to notify us if he develops increased joint pain or joint swelling.  He will follow up in 5 months.   High risk medication use - He is taking MTX 0.6 ml sq once every 7 days, Plaquenil 200 mg twice daily, and folic acid 1 mg daily. He had CBC and CMP drawn yesterday which have not resulted yet. PLQ Eye Exam: 09/11/2018 @ [facility].  We discussed the importance of social distancing and following the standard precautions recommended by the CDC.  He was advised to hold MTX if he develops any signs or symptoms of an infection and to resume once the infection has cleared.    LUQ pain: He has been experiencing LUQ pain for the past 2 weeks.  He was evaluated by his PCP who recommended holding Metformin and MTX for 1 week.  He did not notice any change in his symptoms while off of these medications.  He has not had any change in bowels, nausea, vomiting, fevers, change in appetite, or weight loss.  He is scheduled for a abdominal ultrasound for further evaluation.  He was advised to call Dr. Earlean Shawl to schedule an appointment as well.   PMR (polymyalgia rheumatica) (HCC)-In remission without any muscle weakness or tenderness.  He is on MTX 0.6 ml sq once weekly and folic acid 1 mg po daily.   Primary osteoarthritis of both hands-He is not having any hand pain or joint swelling currently.   DDD (degenerative disc disease), cervical-He has chronic neck pain and stiffness.  He has no symptoms of radiculopathy.   DDD (degenerative disc disease), lumbar-Chronic pain.  He has no symptoms of radiculopathy.  He performs back exercises on a regular  basis.   DDD (degenerative disc disease), thoracic-Chronic pain.   Osteopenia of multiple sites: DEXA 03/23/17 left femoral neck BMD 0.676 with T-score -1.9. He is taking calcium and vitamin D supplement. Future order for vitamin D was placed today. He is due to update DEXA this month.  A future order for DEXA will be placed today.   Other fatigue: He has chronic fatigue which has been stable.   Other medical conditions are listed as follows:   History of prostate cancer  History of diabetes mellitus  History of sleep apnea - on CPAP  History of vitamin D deficiency  Heart block AV complete ([place])   Follow Up Instructions: He will follow up in 5 months.    I discussed the assessment and treatment plan with the patient. The patient was provided an opportunity to ask questions and all were answered. The patient agreed with the plan and demonstrated an understanding of the instructions.   The patient was advised to call back or seek an in-person evaluation if the  symptoms worsen or if the condition fails to improve as anticipated.  I provided 25 minutes of non-face-to-face time during this encounter.   Bo Merino, MD   Scribed by-  Jeffery Sams, PA-C

## 2019-03-20 ENCOUNTER — Encounter: Payer: Self-pay | Admitting: Rheumatology

## 2019-03-20 ENCOUNTER — Telehealth (INDEPENDENT_AMBULATORY_CARE_PROVIDER_SITE_OTHER): Payer: Medicare Other | Admitting: Rheumatology

## 2019-03-20 ENCOUNTER — Other Ambulatory Visit: Payer: Self-pay

## 2019-03-20 DIAGNOSIS — M19041 Primary osteoarthritis, right hand: Secondary | ICD-10-CM | POA: Diagnosis not present

## 2019-03-20 DIAGNOSIS — Z8546 Personal history of malignant neoplasm of prostate: Secondary | ICD-10-CM

## 2019-03-20 DIAGNOSIS — Z79899 Other long term (current) drug therapy: Secondary | ICD-10-CM

## 2019-03-20 DIAGNOSIS — R1012 Left upper quadrant pain: Secondary | ICD-10-CM

## 2019-03-20 DIAGNOSIS — M19042 Primary osteoarthritis, left hand: Secondary | ICD-10-CM | POA: Diagnosis not present

## 2019-03-20 DIAGNOSIS — M5134 Other intervertebral disc degeneration, thoracic region: Secondary | ICD-10-CM

## 2019-03-20 DIAGNOSIS — M5136 Other intervertebral disc degeneration, lumbar region: Secondary | ICD-10-CM

## 2019-03-20 DIAGNOSIS — M7551 Bursitis of right shoulder: Secondary | ICD-10-CM

## 2019-03-20 DIAGNOSIS — M8589 Other specified disorders of bone density and structure, multiple sites: Secondary | ICD-10-CM

## 2019-03-20 DIAGNOSIS — M0609 Rheumatoid arthritis without rheumatoid factor, multiple sites: Secondary | ICD-10-CM

## 2019-03-20 DIAGNOSIS — G629 Polyneuropathy, unspecified: Secondary | ICD-10-CM

## 2019-03-20 DIAGNOSIS — M353 Polymyalgia rheumatica: Secondary | ICD-10-CM

## 2019-03-20 DIAGNOSIS — Z8669 Personal history of other diseases of the nervous system and sense organs: Secondary | ICD-10-CM

## 2019-03-20 DIAGNOSIS — M5030 Other cervical disc degeneration, unspecified cervical region: Secondary | ICD-10-CM

## 2019-03-20 DIAGNOSIS — R5383 Other fatigue: Secondary | ICD-10-CM

## 2019-03-20 DIAGNOSIS — I442 Atrioventricular block, complete: Secondary | ICD-10-CM

## 2019-03-20 DIAGNOSIS — Z8639 Personal history of other endocrine, nutritional and metabolic disease: Secondary | ICD-10-CM

## 2019-03-20 LAB — CUP PACEART REMOTE DEVICE CHECK
Battery Remaining Longevity: 109 mo
Battery Voltage: 3.01 V
Brady Statistic AP VP Percent: 8.46 %
Brady Statistic AP VS Percent: 0 %
Brady Statistic AS VP Percent: 91.41 %
Brady Statistic AS VS Percent: 0.13 %
Brady Statistic RA Percent Paced: 8.44 %
Brady Statistic RV Percent Paced: 99.87 %
Date Time Interrogation Session: 20201209115930
Implantable Lead Implant Date: 20180827
Implantable Lead Implant Date: 20180827
Implantable Lead Location: 753859
Implantable Lead Location: 753860
Implantable Lead Model: 4076
Implantable Lead Model: 4076
Implantable Pulse Generator Implant Date: 20180827
Lead Channel Impedance Value: 342 Ohm
Lead Channel Impedance Value: 399 Ohm
Lead Channel Impedance Value: 399 Ohm
Lead Channel Impedance Value: 456 Ohm
Lead Channel Pacing Threshold Amplitude: 0.625 V
Lead Channel Pacing Threshold Amplitude: 0.875 V
Lead Channel Pacing Threshold Pulse Width: 0.4 ms
Lead Channel Pacing Threshold Pulse Width: 0.4 ms
Lead Channel Sensing Intrinsic Amplitude: 5.625 mV
Lead Channel Sensing Intrinsic Amplitude: 5.625 mV
Lead Channel Sensing Intrinsic Amplitude: 6.625 mV
Lead Channel Sensing Intrinsic Amplitude: 6.625 mV
Lead Channel Setting Pacing Amplitude: 1.5 V
Lead Channel Setting Pacing Amplitude: 2.5 V
Lead Channel Setting Pacing Pulse Width: 0.4 ms
Lead Channel Setting Sensing Sensitivity: 4 mV

## 2019-03-20 NOTE — Telephone Encounter (Signed)
Noted  

## 2019-03-21 ENCOUNTER — Telehealth (INDEPENDENT_AMBULATORY_CARE_PROVIDER_SITE_OTHER): Payer: Medicare Other | Admitting: Neurology

## 2019-03-21 ENCOUNTER — Encounter: Payer: Self-pay | Admitting: Neurology

## 2019-03-21 ENCOUNTER — Other Ambulatory Visit: Payer: Self-pay

## 2019-03-21 DIAGNOSIS — M818 Other osteoporosis without current pathological fracture: Secondary | ICD-10-CM

## 2019-03-21 DIAGNOSIS — C61 Malignant neoplasm of prostate: Secondary | ICD-10-CM

## 2019-03-21 DIAGNOSIS — G629 Polyneuropathy, unspecified: Secondary | ICD-10-CM

## 2019-03-21 DIAGNOSIS — M0609 Rheumatoid arthritis without rheumatoid factor, multiple sites: Secondary | ICD-10-CM | POA: Diagnosis not present

## 2019-03-21 DIAGNOSIS — G4731 Primary central sleep apnea: Secondary | ICD-10-CM | POA: Diagnosis not present

## 2019-03-21 DIAGNOSIS — I4510 Unspecified right bundle-branch block: Secondary | ICD-10-CM | POA: Diagnosis not present

## 2019-03-21 LAB — CMP14+EGFR
ALT: 13 IU/L (ref 0–44)
AST: 8 IU/L (ref 0–40)
Albumin/Globulin Ratio: 1.9 (ref 1.2–2.2)
Albumin: 4.7 g/dL — ABNORMAL HIGH (ref 3.6–4.6)
Alkaline Phosphatase: 85 IU/L (ref 39–117)
BUN/Creatinine Ratio: 19 (ref 10–24)
BUN: 16 mg/dL (ref 8–27)
Bilirubin Total: 0.5 mg/dL (ref 0.0–1.2)
CO2: 25 mmol/L (ref 20–29)
Calcium: 10.3 mg/dL — ABNORMAL HIGH (ref 8.6–10.2)
Chloride: 98 mmol/L (ref 96–106)
Creatinine, Ser: 0.86 mg/dL (ref 0.76–1.27)
GFR calc Af Amer: 93 mL/min/{1.73_m2} (ref >59)
GFR calc non Af Amer: 81 mL/min/{1.73_m2} (ref >59)
Globulin, Total: 2.5 g/dL (ref 1.5–4.5)
Glucose: 122 mg/dL — ABNORMAL HIGH (ref 65–99)
Potassium: 5 mmol/L (ref 3.5–5.2)
Sodium: 142 mmol/L (ref 134–144)
Total Protein: 7.2 g/dL (ref 6.0–8.5)

## 2019-03-21 LAB — CBC WITH DIFFERENTIAL/PLATELET
Basophils Absolute: 0 10*3/uL (ref 0.0–0.2)
Basos: 0 %
EOS (ABSOLUTE): 0.2 10*3/uL (ref 0.0–0.4)
Eos: 2 %
Hematocrit: 45.7 % (ref 37.5–51.0)
Hemoglobin: 15.5 g/dL (ref 13.0–17.7)
Immature Grans (Abs): 0 10*3/uL (ref 0.0–0.1)
Immature Granulocytes: 0 %
Lymphocytes Absolute: 2.2 10*3/uL (ref 0.7–3.1)
Lymphs: 33 %
MCH: 31.8 pg (ref 26.6–33.0)
MCHC: 33.9 g/dL (ref 31.5–35.7)
MCV: 94 fL (ref 79–97)
Monocytes Absolute: 0.7 10*3/uL (ref 0.1–0.9)
Monocytes: 10 %
Neutrophils Absolute: 3.6 10*3/uL (ref 1.4–7.0)
Neutrophils: 55 %
Platelets: 262 10*3/uL (ref 150–450)
RBC: 4.88 x10E6/uL (ref 4.14–5.80)
RDW: 12.1 % (ref 11.6–15.4)
WBC: 6.7 10*3/uL (ref 3.4–10.8)

## 2019-03-21 NOTE — Progress Notes (Signed)
Virtual Visit via Telephone Note  I connected with Jeffery [name] on 03/21/19 at  2:30 PM EST by telephone and verified that I am speaking with the correct person using two identifiers.  Location: Patient: at [facility] Provider: at the [facility] office.   I discussed the limitations, risks, security and privacy concerns of performing an evaluation and management service by video/ telephone and the availability of in person appointments.  The patient expressed understanding and agreed to proceed.   History of Present Illness: Today's visit is explicitly directed toward CPAP compliance in this patient with a rather complex underlying sleep apnea problem.    Observations/Objective: CPAP download from 12-8 2020 shows the last 30 days in retrospect with 100% compliance by days and time.  Average use of time is 8 hours and 18 minutes.  The patient uses an air sense 10 AutoSet machine with a serial #[id number].  It is set between a minimum pressure of 5 cmH2O and a maximum CPAP pressure of 13 cmH2O with 3 cm expiratory pressure relief.  The therapeutic data does show an insufficient reduction of apnea there 2.7 central apneas and 9.2 obstructive apneas per hour on average a total AHI of 14.56 minutes each night are spent with Cheyne-Stokes respirations.  Also the patient may have done well for a limited time while observed in the sleep lab on CPAP I do not think that this will solve his problem and the high residual apnea index speaks against CPAP therapy.  What I will do today is to reduce the expiratory pressure relief from 3-2 cmH2O which can be handled remotely as the patient is currently at his second home in the [place] of [place].  I also would offer a maximum pressure increased to 14 cmH2O.    The patient will need supplies he is now using a [employer] Vitera mask which has allowed him to sleep on his side.   Assessment and Plan: decrease EPR to 2 cm from 3 cm water - if AHI  goes down, increase pressure window by 1 or 2 cm water. If AHI increases, change to BIPAP    Follow Up Instructions: follow up by virtual visit with MD in 5-6 weeks , allowing for data review.     I discussed the assessment and treatment plan with the patient. The patient was provided an opportunity to ask questions and all were answered. The patient agreed with the plan and demonstrated an understanding of the instructions.   The patient was advised to call back or seek an in-person evaluation if the symptoms worsen or if the condition fails to improve as anticipated.  I provided 12 minutes of non-face-to-face time during this encounter.   Larey Seat, MD

## 2019-03-21 NOTE — Patient Instructions (Signed)
Virtual Visit via Telephone Note  I connected withPrentis H O'Tuel [name] on 03/21/19 at  2:30 PM EST by telephoneand verified that I am speaking with the correct person using two identifiers.  Location: Patient: at [facility] Provider: at the [facility] office.  I discussed the limitations, risks, security and privacy concerns of performing an evaluation and management service by video/ telephone and the availability of in person appointments.  The patient expressed understanding and agreed to proceed.   History of Present Illness: Today's visit is explicitly directed toward CPAP compliance in this patient with a rather complex underlying sleep apnea problem.   Observations/Objective: CPAP download from 12-8 2020 shows the last 30 days in retrospect with 100% compliance by days and time.  Average use of time is 8 hours and 18 minutes.  The patient uses an air sense 10 AutoSet machine with a serial #[id number].  It is set between a minimum pressure of 5 cmH2O and a maximum CPAP pressure of 13 cmH2O with 3 cm expiratory pressure relief.  The therapeutic data does show an insufficient reduction of apnea there 2.7 central apneas and 9.2 obstructive apneas per hour on average a total AHI of 14.56 minutes each night are spent with Cheyne-Stokes respirations.  Also the patient may have done well for a limited time while observed in the sleep lab on CPAP I do not think that this will solve his problem and the high residual apnea index speaks against CPAP therapy.  What I will do today is to reduce the expiratory pressure relief from 3-2 cmH2O which can be handled remotely as the patient is currently at his second home in the [place] of [place].  I also would offer a maximum pressure increased to 14 cmH2O.    The patient will need supplies he is now using a [employer] Vitera mask which has allowed him to sleep on his side.   Assessment and Plan: decrease EPR to 2 cm from 3 cm water -  if AHI goes down, increase pressure window by 1 or 2 cm water. If AHI increases, change to BIPAP    Follow Up Instructions: follow up by virtual visit with MD in 5-6 weeks , allowing for data review.    I discussed the assessment and treatment plan with the patient. The patient was provided an opportunity to ask questions and all were answered. The patient agreed with the plan and demonstrated an understanding of the instructions.  The patient was advised to call back or seek an in-person evaluation if the symptoms worsen or if the condition fails to improve as anticipated.  I provided 12 minutes of non-face-to-face time during this encounter.   Jeffery Seat, MD

## 2019-03-22 ENCOUNTER — Other Ambulatory Visit: Payer: Medicare Other

## 2019-03-22 ENCOUNTER — Telehealth: Payer: Self-pay | Admitting: Rheumatology

## 2019-03-22 NOTE — Telephone Encounter (Signed)
Labs have not resulted, I called patient.

## 2019-03-22 NOTE — Telephone Encounter (Signed)
Patient left a message requesting a call back for lab results he had several days ago at [facility]. Please call to advise.

## 2019-03-25 NOTE — Progress Notes (Signed)
Glucose is mildly elevated because he was not fasting.  Calcium is mildly elevated because albumin is mildly elevated.  We will continue to monitor.  No change in treatment needed.

## 2019-03-28 ENCOUNTER — Ambulatory Visit
Admission: RE | Admit: 2019-03-28 | Discharge: 2019-03-28 | Disposition: A | Discharge status: Home / Self Care | Payer: Medicare Other | Source: Ambulatory Visit | Attending: Internal Medicine | Admitting: Internal Medicine

## 2019-03-28 ENCOUNTER — Other Ambulatory Visit: Payer: Self-pay

## 2019-03-28 DIAGNOSIS — R109 Unspecified abdominal pain: Secondary | ICD-10-CM

## 2019-03-28 DIAGNOSIS — R140 Abdominal distension (gaseous): Secondary | ICD-10-CM

## 2019-04-02 ENCOUNTER — Telehealth: Payer: Self-pay | Admitting: Rheumatology

## 2019-04-02 NOTE — Telephone Encounter (Signed)
Patient will call back to schedule a follow up appointment for around 08/19/2019, when he has a calendar available.

## 2019-04-02 NOTE — Telephone Encounter (Signed)
-----   Message from Shona Needles, RT sent at 03/21/2019 11:24 AM EST ----- Regarding: 5 MONTH F/U

## 2019-04-30 ENCOUNTER — Telehealth: Payer: Self-pay | Admitting: Rheumatology

## 2019-04-30 ENCOUNTER — Telehealth: Payer: Self-pay | Admitting: Neurology

## 2019-04-30 DIAGNOSIS — G4733 Obstructive sleep apnea (adult) (pediatric): Secondary | ICD-10-CM

## 2019-04-30 DIAGNOSIS — G4731 Primary central sleep apnea: Secondary | ICD-10-CM

## 2019-04-30 NOTE — Telephone Encounter (Signed)
Called the patient and advised that Dr Brett Fairy reviewed his download and informed that Dr Brett Fairy does recognize their is a leak present which can cause the unknown apnea's to occur. She recommends bipap titration to be completed to see if that is necessary in treating his apnea. She recommends in the meantime having his mask refitted to help with pressure leaks and using his chin strap. Pt verbalized understanding.

## 2019-04-30 NOTE — Telephone Encounter (Signed)
Called the patient to discuss his concern. The patient continues to struggle with the CPAP machine since the pressure was increased to to 14 cm water pressure. In pulling his download he continues to max at 13.9 cm water pressure on the machine and have 12.7 AHI events. Patient is very concerned with this and would like to know what should be done to move forward to help. He wanted to point out that when pressure was increased from 13-14 he feels there is more leaks present. He doesn't know if we need to move forward with pursing testing to see if Bipap is needed, increasing pressure again. Pt is asking Jeffery [name] compare his download from previous visit to last 30 days and see what her thoughts and recommendations are. He feels they are worse. He would like Jeffery [name] to call if possible, I informed I would pass this along to her and have her review and either her or myself would be in contact with next steps. Patient was appreciative.

## 2019-04-30 NOTE — Telephone Encounter (Signed)
Patient is requesting a call back , patient wouldn't provide details.

## 2019-04-30 NOTE — Telephone Encounter (Signed)
Patient had first COVID vaccine, and wants to know if he should hold off on medication right now? If so, how long? Please call to advise.

## 2019-04-30 NOTE — Telephone Encounter (Signed)
We have been here before- can we pull 90 days of PAP download?  .  I think we need to get him on BiPAP.

## 2019-04-30 NOTE — Telephone Encounter (Signed)
Contacted patient and advised since the COVID vaccine is not a live virus he may continue his medication.

## 2019-05-08 ENCOUNTER — Other Ambulatory Visit: Payer: Self-pay | Admitting: Neurology

## 2019-05-08 ENCOUNTER — Telehealth: Payer: Self-pay

## 2019-05-08 DIAGNOSIS — G4731 Primary central sleep apnea: Secondary | ICD-10-CM

## 2019-05-08 DIAGNOSIS — G629 Polyneuropathy, unspecified: Secondary | ICD-10-CM

## 2019-05-08 DIAGNOSIS — G4733 Obstructive sleep apnea (adult) (pediatric): Secondary | ICD-10-CM

## 2019-05-08 DIAGNOSIS — Z9989 Dependence on other enabling machines and devices: Secondary | ICD-10-CM

## 2019-05-08 DIAGNOSIS — M353 Polymyalgia rheumatica: Secondary | ICD-10-CM

## 2019-05-08 NOTE — Telephone Encounter (Signed)
DME worked with patient with a different mask for his high leak. Repeat download is scanned in. His leak is now 16.6 in 95th percentile. AHI 10.9 with 2.1 centrals. He isnusing 8 hours and 20 mins nightly. With this data a Bipap will not get approved. Spoke with Dr. Brett Fairy and she is in agreement with Increasing his auto cpap to 16/7 to help reduce AHI more. Can this be sent to his DME company?

## 2019-05-08 NOTE — Telephone Encounter (Signed)
Called the patient and there was no answer. LVM advising the patient to call back.

## 2019-05-08 NOTE — Telephone Encounter (Signed)
I already send a DME order to increase pressure by 1 cm and get download, then- if needed- get another increase by 1 cm water.

## 2019-05-13 NOTE — Telephone Encounter (Signed)
Pt returned call and I was able to inform the patient that Dr Brett Fairy and our sleep lab manager reviewed his download and when we looked at the data his apnea was somewhat better since changing his mask. Informed him insurance requires that we would have to attempt increasing the pressure on the CPAP machine to show he is failing at the maximum pressure. Patient pressure increase to the machine was changed on 05/09/19. Advised the patient I will reassess and complete a 7 day download on Thursday 2/4 and see if this is looking better or worse. Patient verbalized understanding.

## 2019-06-12 ENCOUNTER — Encounter: Payer: Self-pay | Admitting: Rheumatology

## 2019-06-13 ENCOUNTER — Telehealth: Payer: Self-pay | Admitting: *Deleted

## 2019-06-13 NOTE — Telephone Encounter (Signed)
Received DEXA results from [facility].  Date of Scan: 06/12/2019 Lowest T-score and site measured: -1.9 Left Total Hip Significant changes in BMD and site measured (5% and above): 6% Left 1/3 radius and Right femur Neck   Current Regimen: Calcium and Vitamin D  Recommendation: stable repeat in 2 years.

## 2019-06-18 ENCOUNTER — Telehealth: Payer: Self-pay | Admitting: Rheumatology

## 2019-06-18 ENCOUNTER — Ambulatory Visit (INDEPENDENT_AMBULATORY_CARE_PROVIDER_SITE_OTHER): Payer: Medicare Other | Admitting: *Deleted

## 2019-06-18 DIAGNOSIS — I442 Atrioventricular block, complete: Secondary | ICD-10-CM | POA: Diagnosis not present

## 2019-06-18 NOTE — Telephone Encounter (Signed)
Patient called stating he would like to have injection of his right ring finger.  Patient is requesting a return call to see if Dr. Estanislado Pandy "could work him in tomorrow."  Please advise.

## 2019-06-19 ENCOUNTER — Ambulatory Visit (INDEPENDENT_AMBULATORY_CARE_PROVIDER_SITE_OTHER): Payer: Medicare Other | Admitting: Rheumatology

## 2019-06-19 ENCOUNTER — Other Ambulatory Visit: Payer: Self-pay

## 2019-06-19 DIAGNOSIS — M65341 Trigger finger, right ring finger: Secondary | ICD-10-CM

## 2019-06-19 MED ORDER — LIDOCAINE HCL 1 % IJ SOLN
0.5000 mL | INTRAMUSCULAR | Status: AC | PRN
  Administered 2019-06-19: .5 mL

## 2019-06-19 MED ORDER — TRIAMCINOLONE ACETONIDE 40 MG/ML IJ SUSP
10.0000 mg | INTRAMUSCULAR | Status: AC | PRN
  Administered 2019-06-19: 10 mg

## 2019-06-19 NOTE — Progress Notes (Signed)
   Procedure Note  Patient: Jeffery [name]             Date of Birth: [birth year]           MRN: [id number]             Visit Date: 06/19/2019  Procedures: Visit Diagnoses:  1. Trigger ring finger of right hand     Hand/UE Inj for trigger finger on 06/19/2019 1:35 PM Indications: pain, tendon swelling and therapeutic Details: 27 G needle, ultrasound-guided volar approach Medications: 0.5 mL lidocaine 1 %; 10 mg triamcinolone acetonide 40 MG/ML Aspirate: 0 mL Procedure, treatment alternatives, risks and benefits explained, specific risks discussed. Immediately prior to procedure a time out was called to verify the correct patient, procedure, equipment, support staff and site/side marked as required. Patient was prepped and draped in the usual sterile fashion.    Post procedure precautions were discussed.  Bo Merino, MD

## 2019-06-19 NOTE — Telephone Encounter (Signed)
Patient scheduled for 06/19/19 at 1:15 pm.

## 2019-06-20 LAB — CUP PACEART REMOTE DEVICE CHECK
Battery Remaining Longevity: 106 mo
Battery Voltage: 3 V
Brady Statistic AP VP Percent: 7.98 %
Brady Statistic AP VS Percent: 0 %
Brady Statistic AS VP Percent: 91.95 %
Brady Statistic AS VS Percent: 0.06 %
Brady Statistic RA Percent Paced: 7.97 %
Brady Statistic RV Percent Paced: 99.93 %
Date Time Interrogation Session: 20210310100731
Implantable Lead Implant Date: 20180827
Implantable Lead Implant Date: 20180827
Implantable Lead Location: 753859
Implantable Lead Location: 753860
Implantable Lead Model: 4076
Implantable Lead Model: 4076
Implantable Pulse Generator Implant Date: 20180827
Lead Channel Impedance Value: 323 Ohm
Lead Channel Impedance Value: 380 Ohm
Lead Channel Impedance Value: 399 Ohm
Lead Channel Impedance Value: 456 Ohm
Lead Channel Pacing Threshold Amplitude: 0.75 V
Lead Channel Pacing Threshold Amplitude: 0.875 V
Lead Channel Pacing Threshold Pulse Width: 0.4 ms
Lead Channel Pacing Threshold Pulse Width: 0.4 ms
Lead Channel Sensing Intrinsic Amplitude: 3.75 mV
Lead Channel Sensing Intrinsic Amplitude: 3.75 mV
Lead Channel Sensing Intrinsic Amplitude: 6.625 mV
Lead Channel Sensing Intrinsic Amplitude: 6.625 mV
Lead Channel Setting Pacing Amplitude: 1.5 V
Lead Channel Setting Pacing Amplitude: 2.5 V
Lead Channel Setting Pacing Pulse Width: 0.4 ms
Lead Channel Setting Sensing Sensitivity: 4 mV

## 2019-06-20 NOTE — Progress Notes (Signed)
PPM Remote  

## 2019-06-28 ENCOUNTER — Telehealth: Payer: Self-pay | Admitting: *Deleted

## 2019-06-28 MED ORDER — METHOCARBAMOL 500 MG PO TABS: 500.0000 mg | ORAL_TABLET | Freq: Two times a day (BID) | ORAL | 0 refills | Status: DC | PRN

## 2019-06-28 NOTE — Telephone Encounter (Signed)
Please advise 

## 2019-06-28 NOTE — Telephone Encounter (Signed)
I returned patient's call.  He is uncertain if the pain is in the muscle starts coming from his cervical spine.  He has seen Dr. Brett Fairy in the past for the cervical spine.  We discussed and reviewed his CT scan.  He does not want to take a prednisone taper.  He states he wants to take Aleve for 3 to 4 days.  He also requested some other medication to relieve discomfort.  We will call in methocarbamol 500 mg p.o. twice daily as needed for 7 days.  Side effects of methocarbamol were discussed.  He has been advised not to drive after taking muscle relaxer.  If his symptoms persist he will make appointment with Dr. Brett Fairy.

## 2019-06-28 NOTE — Telephone Encounter (Signed)
Patient states he is having pain in his neck radiating on both sides. Patient states he has been doing several computer projects to which he thinks has aggravated it. Patient states he has tried heat and it does help. Patient states he plans to take some Aleve (LFTS and Kidney function WNL). Patein would like to know if there are any other recommendations you have. Please advise.

## 2019-06-28 NOTE — Telephone Encounter (Signed)
Patient advised prescription sent to the pharmacy.  

## 2019-07-01 ENCOUNTER — Telehealth: Payer: Self-pay | Admitting: Neurology

## 2019-07-01 NOTE — Telephone Encounter (Signed)
Patient called after our service on 06/28/2019 with a complaint of neck pain.  I called the patient and talk to him.  He reported that he has had neck pain infrequently.  He had an MRI in the past.  He did not have any neck surgery, and denied any radiating neck pain.  He described what sounded like muscular pain.  He did not recall overdoing physical activity or lifting anything.  He did report using the computer more than he typically does.  He reported that he also called his rheumatologist and a muscle relaxer was called in.  He was advised that I would pass this message along to his neurologist.  In the interim, he was advised to use Advil as needed and a microwavable heat pad to see if that alleviates his neck pain.  He demonstrated understanding and agreement.

## 2019-07-01 NOTE — Telephone Encounter (Signed)
I much rather not add any medication at this point and I agree with heat and cold packs. Stretching exercises, limiting time on the computer to 45 minute intervals and alternating with stretching movements. CD

## 2019-07-20 ENCOUNTER — Other Ambulatory Visit: Payer: Self-pay | Admitting: Rheumatology

## 2019-07-20 DIAGNOSIS — Z79899 Other long term (current) drug therapy: Secondary | ICD-10-CM

## 2019-07-22 ENCOUNTER — Other Ambulatory Visit: Payer: Self-pay | Admitting: *Deleted

## 2019-07-22 ENCOUNTER — Other Ambulatory Visit: Payer: Self-pay

## 2019-07-22 DIAGNOSIS — Z79899 Other long term (current) drug therapy: Secondary | ICD-10-CM

## 2019-07-22 DIAGNOSIS — Z8639 Personal history of other endocrine, nutritional and metabolic disease: Secondary | ICD-10-CM

## 2019-07-22 DIAGNOSIS — M8589 Other specified disorders of bone density and structure, multiple sites: Secondary | ICD-10-CM

## 2019-07-22 NOTE — Telephone Encounter (Signed)
ok 

## 2019-07-22 NOTE — Telephone Encounter (Signed)
Last Visit: 03/20/19 Next Visit: 08/14/19 Labs: 03/19/19 Glucose is mildly elevated  Calcium is mildly elevated because albumin is mildly elevated  Patient advised he is due to update labs and will be coming to update them this afternoon.  Okay to refill MTX?

## 2019-07-23 LAB — COMPLETE METABOLIC PANEL WITH GFR
AG Ratio: 1.7 (calc) (ref 1.0–2.5)
ALT: 16 U/L (ref 9–46)
AST: 8 U/L — ABNORMAL LOW (ref 10–35)
Albumin: 4.3 g/dL (ref 3.6–5.1)
Alkaline phosphatase (APISO): 76 U/L (ref 35–144)
BUN/Creatinine Ratio: 21 (calc) (ref 6–22)
BUN: 14 mg/dL (ref 7–25)
CO2: 31 mmol/L (ref 20–32)
Calcium: 9.6 mg/dL (ref 8.6–10.3)
Chloride: 99 mmol/L (ref 98–110)
Creat: 0.68 mg/dL — ABNORMAL LOW (ref 0.70–1.11)
GFR, Est African American: 103 mL/min/{1.73_m2} (ref >=60)
GFR, Est Non African American: 89 mL/min/{1.73_m2} (ref >=60)
Globulin: 2.6 g/dL (calc) (ref 1.9–3.7)
Glucose, Bld: 223 mg/dL — ABNORMAL HIGH (ref 65–99)
Potassium: 4.1 mmol/L (ref 3.5–5.3)
Sodium: 138 mmol/L (ref 135–146)
Total Bilirubin: 0.8 mg/dL (ref 0.2–1.2)
Total Protein: 6.9 g/dL (ref 6.1–8.1)

## 2019-07-23 LAB — CBC WITH DIFFERENTIAL/PLATELET
Absolute Monocytes: 455 cells/uL (ref 200–950)
Basophils Absolute: 13 cells/uL (ref 0–200)
Basophils Relative: 0.2 %
Eosinophils Absolute: 79 cells/uL (ref 15–500)
Eosinophils Relative: 1.2 %
HCT: 42.4 % (ref 38.5–50.0)
Hemoglobin: 14.3 g/dL (ref 13.2–17.1)
Lymphs Abs: 1472 cells/uL (ref 850–3900)
MCH: 31.1 pg (ref 27.0–33.0)
MCHC: 33.7 g/dL (ref 32.0–36.0)
MCV: 92.2 fL (ref 80.0–100.0)
MPV: 9.8 fL (ref 7.5–12.5)
Monocytes Relative: 6.9 %
Neutro Abs: 4580 cells/uL (ref 1500–7800)
Neutrophils Relative %: 69.4 %
Platelets: 240 10*3/uL (ref 140–400)
RBC: 4.6 10*6/uL (ref 4.20–5.80)
RDW: 11.9 % (ref 11.0–15.0)
Total Lymphocyte: 22.3 %
WBC: 6.6 10*3/uL (ref 3.8–10.8)

## 2019-07-23 LAB — VITAMIN D 25 HYDROXY (VIT D DEFICIENCY, FRACTURES): Vit D, 25-Hydroxy: 37 ng/mL (ref 30–100)

## 2019-07-23 NOTE — Progress Notes (Signed)
Vitamin D WNL.  Please recommend a maintenance dose of vitamin D.

## 2019-07-23 NOTE — Progress Notes (Signed)
Glucose is elevated.  Rest the labs are stable.  Please forward labs to his PCP.

## 2019-07-30 ENCOUNTER — Telehealth: Payer: Self-pay

## 2019-07-30 DIAGNOSIS — M0609 Rheumatoid arthritis without rheumatoid factor, multiple sites: Secondary | ICD-10-CM

## 2019-07-30 MED ORDER — HYDROXYCHLOROQUINE SULFATE 200 MG PO TABS: 200.0000 mg | ORAL_TABLET | Freq: Two times a day (BID) | ORAL | 0 refills | Status: DC

## 2019-07-30 NOTE — Telephone Encounter (Signed)
Refill request received via fax from [facility] ([place]) for plaquenil.   Last Visit: 03/20/2019 telemedicine  Next Visit: 08/14/2019 Labs: 07/22/2019 Glucose is elevated. Rest the labs are stable. Eye exam: 09/11/2018  Okay to refill per Dr. Estanislado Pandy.

## 2019-08-07 ENCOUNTER — Telehealth: Payer: Self-pay | Admitting: Neurology

## 2019-08-07 DIAGNOSIS — R29898 Other symptoms and signs involving the musculoskeletal system: Secondary | ICD-10-CM

## 2019-08-07 DIAGNOSIS — G629 Polyneuropathy, unspecified: Secondary | ICD-10-CM

## 2019-08-07 DIAGNOSIS — M4802 Spinal stenosis, cervical region: Secondary | ICD-10-CM

## 2019-08-07 NOTE — Telephone Encounter (Signed)
I would be OK with neurosurgery evaluation - and also with seeing him first if he desires. CD

## 2019-08-07 NOTE — Telephone Encounter (Signed)
Pt called wanting to speak to provider or RN about Cervical Stenosis. Pt would not give any other information. Please advise.

## 2019-08-07 NOTE — Telephone Encounter (Signed)
Called the patient and we discussed his concerns in regards to his neck. Last spoke with Dr Rexene Alberts (work in) 3/22 and explained all that was going on. He has been monitoring the symptoms and the pain continues to radiate neck and shoulders. He has no complaints of pain or tingling in the arms in legs but most recently describes that he is having some balance concern and noticing decrease strength in lower extremities.  Previously when this subject was discussed he states he was told to follow with neck specialist but then he never heard from Dr Rita Ohara. He states that the neck didn't bother him anymore after that and he has not worried with it until now it has become more prominent. Patient is asking if he should come in for office visit here through our office or have a referral sent to neurosurgery again due to the symptoms. I have blocked a slot for the patient for first available and I will wait and see what Dr Dohmeier recommends.

## 2019-08-08 NOTE — Addendum Note (Signed)
Addended by: Darleen Crocker on: 08/08/2019 01:11 PM   Modules accepted: Orders

## 2019-08-08 NOTE — Telephone Encounter (Signed)
Called the patient back to advise that Dr Brett Fairy ok'd the referral to neurosurgery. Patient is requesting if referral can be placed to Dr Ellene Route or Dr Vertell Limber. Advised I would make this known to our referral department when they send the request.

## 2019-08-08 NOTE — Addendum Note (Signed)
Addended by: Darleen Crocker on: 08/08/2019 01:37 PM   Modules accepted: Orders

## 2019-08-09 NOTE — Progress Notes (Signed)
Office Visit Note  Patient: Jeffery [name]             Date of Birth: [birth year]           MRN: [id number]             PCP: Shon Baton, MD Referring: Shon Baton, MD Visit Date: 08/14/2019 Occupation: @GUAROCC @  Subjective:  Neck pain.   History of Present Illness: Jeffery [name] is a 85 y.o. male with history of rheumatoid arthritis, osteoarthritis, degenerative disc disease and osteopenia.  He states that for the last few months has been having increased pain and discomfort in his cervical region.  He has difficulty with rotation of his head.  He has seen Dr. Brett Fairy who referred him to neurosurgery about a month ago.  He has not heard back from neurosurgery group.  He states he is in constant pain.  He was given a prescription of methocarbamol which he has not tried and he was concerned about the side effects of the medication.  He denies any radiculopathy.  He also has been experiencing some weakness in his lower extremities after he walks for a while.  He has not seen any recurrence of symptoms of PMR.  He denies any change rheumatoid arthritis flare.  He has been taking methotrexate and Plaquenil regular basis.  Activities of Daily Living:  Patient reports morning stiffness for 1 hour.   Patient Denies nocturnal pain.  Difficulty dressing/grooming: Denies Difficulty climbing stairs: Denies Difficulty getting out of chair: Denies Difficulty using hands for taps, buttons, cutlery, and/or writing: Denies  Review of Systems  Constitutional: Positive for fatigue.  HENT: Negative for mouth sores, mouth dryness and nose dryness.   Eyes: Negative for itching and dryness.  Respiratory: Negative for shortness of breath and difficulty breathing.   Cardiovascular: Negative for chest pain and palpitations.  Gastrointestinal: Negative for blood in stool, constipation and diarrhea.  Endocrine: Negative for increased urination.  Genitourinary: Negative for difficulty urinating.    Musculoskeletal: Positive for arthralgias, joint pain, myalgias, morning stiffness, muscle tenderness and myalgias. Negative for joint swelling.  Skin: Negative for rash and redness.  Allergic/Immunologic: Negative for susceptible to infections.  Neurological: Positive for weakness. Negative for dizziness, numbness, headaches and memory loss.  Hematological: Negative for bruising/bleeding tendency.  Psychiatric/Behavioral: Negative for confusion.    PMFS History:  Patient Active Problem List   Diagnosis Date Noted  . Educated about COVID-19 virus infection 10/08/2018  . Aortic root dilatation ([place]) 10/08/2018  . Mitral annular calcification 10/08/2018  . Neuropathy 08/21/2017  . Hydroxychloroquine causing adverse effect in therapeutic use 08/21/2017  . Symptomatic bradycardia 02/23/2017  . Status cardiac pacemaker 02/23/2017  . CSA (central sleep apnea) 02/23/2017  . Heart block AV complete ([place]) 01/18/2017  . Vitamin D deficiency 02/08/2016  . Trigger finger, left middle finger 02/08/2016  . High risk medication use 02/08/2016  . Bursitis of right shoulder 02/08/2016  . DDD (degenerative disc disease), lumbar 02/08/2016  . DDD (degenerative disc disease), thoracic 02/08/2016  . Rheumatoid arthritis of multiple sites without rheumatoid factor ([place]) 11/28/2015    Class: Chronic  . Other secondary osteoarthritis of both hands 11/28/2015    Class: Chronic  . PMR (polymyalgia rheumatica) (HCC) 11/28/2015    Class: Chronic  . Osteoporosis 11/28/2015    Class: Chronic  . Prostate cancer ([place]) 11/28/2015    Class: History of  . Duodenal ulcer 11/28/2015    Class: Chronic  . High cholesterol 11/28/2015  .  Dizziness 07/16/2015  . Incomplete RBBB 03/31/2015  . Sleep apnea, primary central 03/03/2015  . Polyneuropathy ([place]) 01/24/2013  . Right inguinal hernia 07/05/2012    Past Medical History:  Diagnosis Date  . Arthritis   . BCC (basal cell carcinoma)   . BPH (benign  prostatic hypertrophy) 2008  . Cancer [facility]) 2008   Prostate  . Cervical stenosis of spine    per patient   . Complete heart block ([place])   . Complex sleep apnea syndrome    does not use CPAP  . Diabetes mellitus without complication ([place])   . Diverticulosis   . ED (erectile dysfunction)   . GERD (gastroesophageal reflux disease)   . Hemorrhoids   . Hernia, inguinal   . Hyperlipidemia   . IFG (impaired fasting glucose)   . Osteopenia   . Peptic ulcer disease with hemorrhage 1993  . Shingles   . Vitamin D deficiency     Family History  Problem Relation Age of Onset  . Heart failure Mother        Died age 48.  Apparently had valve surgery.   . Congenital heart disease Brother    Past Surgical History:  Procedure Laterality Date  . BACK SURGERY N/A 1984  . CATARACT EXTRACTION, BILATERAL    . HERNIA REPAIR    . LAMINECTOMY  1984   L 5  . PACEMAKER IMPLANT  12/05/2016   MDT Azure XT DR MRI conditional PPM implanted for CHB by Dr Vista Deck in [place]  . PACEMAKER INSERTION  12/05/2016  . PROSTATE SURGERY  12/2006   Robotic  . TONSILLECTOMY  85 years old  . TRIGGER FINGER RELEASE  03/01/2012   Procedure: MINOR RELEASE TRIGGER FINGER/A-1 PULLEY;  Surgeon: Cammie Sickle., MD;  Location: [facility];  Service: Orthopedics;  Laterality: Left;  long finger  . TRIGGER FINGER RELEASE Left 02/08/2018   Procedure: RELEASE TRIGGER FINGER/A-1 PULLEY LEFT RING FINGER;  Surgeon: Daryll Brod, MD;  Location: [facility];  Service: Orthopedics;  Laterality: Left;   Social History   Social History Narrative   Right handed, Retired .Married, 2 kids. Caffeine none.   3 Step    Immunization History  Administered Date(s) Administered  . Influenza, High Dose Seasonal PF 03/03/2017, 01/26/2018     Objective: Vital Signs: BP 117/73 (BP Location: Left Arm, Patient Position: Sitting, Cuff Size: Normal)   Pulse 92   Resp 16   Ht 6\' 2"  (1.88 m)   Wt 172 lb (78 kg)    BMI 22.08 kg/m    Physical Exam Vitals and nursing note reviewed.  Constitutional:      Appearance: He is well-developed.  HENT:     Head: Normocephalic and atraumatic.  Eyes:     Conjunctiva/sclera: Conjunctivae normal.     Pupils: Pupils are equal, round, and reactive to light.  Cardiovascular:     Rate and Rhythm: Normal rate and regular rhythm.     Heart sounds: Normal heart sounds.  Pulmonary:     Effort: Pulmonary effort is normal.     Breath sounds: Normal breath sounds.  Abdominal:     General: Bowel sounds are normal.     Palpations: Abdomen is soft.  Musculoskeletal:     Cervical back: Normal range of motion and neck supple.  Skin:    General: Skin is warm and dry.     Capillary Refill: Capillary refill takes less than 2 seconds.  Neurological:  Mental Status: He is alert and oriented to person, place, and time.  Psychiatric:        Behavior: Behavior normal.      Musculoskeletal Exam: He had some limitation with lateral rotation.  He had tenderness in the paravertebral region.  No point tenderness was noted.  Thoracic and lumbar spine with good range of motion.  Shoulder joints, elbow joints, wrist joints with good range of motion.  He has DIP and PIP thickening with no synovitis.  Hip joints, knee joints, ankles with good range of motion.  He had no tenderness over MTPs.  CDAI Exam: CDAI Score: 0.2  Patient Global: 1 mm; Provider Global: 1 mm Swollen: 0 ; Tender: 0  Joint Exam 08/14/2019   No joint exam has been documented for this visit   There is currently no information documented on the homunculus. Go to the Rheumatology activity and complete the homunculus joint exam.  Investigation: No additional findings.  Imaging: No results found.  Recent Labs: Lab Results  Component Value Date   WBC 6.6 07/22/2019   HGB 14.3 07/22/2019   PLT 240 07/22/2019   NA 138 07/22/2019   K 4.1 07/22/2019   CL 99 07/22/2019   CO2 31 07/22/2019   GLUCOSE 223  (H) 07/22/2019   BUN 14 07/22/2019   CREATININE 0.68 (L) 07/22/2019   BILITOT 0.8 07/22/2019   ALKPHOS 85 03/19/2019   AST 8 (L) 07/22/2019   ALT 16 07/22/2019   PROT 6.9 07/22/2019   ALBUMIN 4.7 (H) 03/19/2019   CALCIUM 9.6 07/22/2019   GFRAA 103 07/22/2019    Speciality Comments: PLQ Eye Exam: 09/11/2018 @ [facility]  Procedures:  No procedures performed Allergies: Sulfasalazine, Demerol [meperidine], Garlic, Other, and Sulfa antibiotics   Assessment / Plan:     Visit Diagnoses: Rheumatoid arthritis of multiple sites without rheumatoid factor (HCC) - RF negative, CCP negative, 1433 eta negative, ANA negative.  His rheumatoid arthritis appears to be well controlled.  He had no synovitis on my examination today.  High risk medication use - MTX 0.6 ml sq once weekly, folic acid 1 mg po daily, PLQ 200 mg BID his labs are stable.  Last labs were in April which were normal.  His last eye examination was on September 11, 2018 which was normal.  Osteopenia of multiple sites - 03/21 T -1.9.  He is taking calcium and vitamin D and has been off bisphosphonates.  History of vitamin D deficiency-he is on vitamin D supplement.  PMR (polymyalgia rheumatica) (HCC)-he had no muscular weakness or tenderness on examination today.  He could get up from the squatting position without difficulty.  Primary osteoarthritis of both hands-joint protection was discussed.  Neck pain-he has been experiencing a lot of neck discomfort.  He has difficulty with lateral rotation.  He has neck stiffness.  He is waiting to see neurosurgeons.  Have given him a handout on neck exercises.  I will also refer him to physical therapy per his request.  DDD (degenerative disc disease), cervical-I reviewed his MRI results from 2018 with him today.  DDD (degenerative disc disease), lumbar-currently is not having much discomfort.  DDD (degenerative disc disease), thoracic-no pain.  Other fatigue-improved.  Other medical  problems are listed as follows:  History of prostate cancer  History of diabetes mellitus  History of sleep apnea  Heart block AV complete ([place])  Orders: Orders Placed This Encounter  Procedures  . Ambulatory referral to Physical Therapy   No orders  of the defined types were placed in this encounter.   Face-to-face time spent with patient was 30 minutes. Greater than 50% of time was spent in counseling and coordination of care.  Follow-Up Instructions: Return in about 5 months (around 01/14/2020) for Rheumatoid arthritis, Osteoarthritis.   Bo Merino, MD  Note - This record has been created using Editor, commissioning.  Chart creation errors have been sought, but may not always  have been located. Such creation errors do not reflect on  the standard of medical care.

## 2019-08-14 ENCOUNTER — Other Ambulatory Visit: Payer: Self-pay

## 2019-08-14 ENCOUNTER — Ambulatory Visit (INDEPENDENT_AMBULATORY_CARE_PROVIDER_SITE_OTHER): Payer: Medicare Other | Admitting: Internal Medicine

## 2019-08-14 ENCOUNTER — Ambulatory Visit (INDEPENDENT_AMBULATORY_CARE_PROVIDER_SITE_OTHER): Payer: Medicare Other | Admitting: Rheumatology

## 2019-08-14 ENCOUNTER — Encounter: Payer: Self-pay | Admitting: Rheumatology

## 2019-08-14 ENCOUNTER — Encounter: Payer: Self-pay | Admitting: Neurology

## 2019-08-14 ENCOUNTER — Encounter: Payer: Self-pay | Admitting: Internal Medicine

## 2019-08-14 VITALS — BP 122/70 | HR 87 | Ht 74.0 in | Wt 169.0 lb

## 2019-08-14 VITALS — BP 117/73 | HR 92 | Resp 16 | Ht 74.0 in | Wt 172.0 lb

## 2019-08-14 DIAGNOSIS — Z8639 Personal history of other endocrine, nutritional and metabolic disease: Secondary | ICD-10-CM

## 2019-08-14 DIAGNOSIS — M7551 Bursitis of right shoulder: Secondary | ICD-10-CM

## 2019-08-14 DIAGNOSIS — I442 Atrioventricular block, complete: Secondary | ICD-10-CM

## 2019-08-14 DIAGNOSIS — M8589 Other specified disorders of bone density and structure, multiple sites: Secondary | ICD-10-CM

## 2019-08-14 DIAGNOSIS — M5134 Other intervertebral disc degeneration, thoracic region: Secondary | ICD-10-CM

## 2019-08-14 DIAGNOSIS — Z79899 Other long term (current) drug therapy: Secondary | ICD-10-CM | POA: Diagnosis not present

## 2019-08-14 DIAGNOSIS — M353 Polymyalgia rheumatica: Secondary | ICD-10-CM

## 2019-08-14 DIAGNOSIS — M19041 Primary osteoarthritis, right hand: Secondary | ICD-10-CM

## 2019-08-14 DIAGNOSIS — R5383 Other fatigue: Secondary | ICD-10-CM

## 2019-08-14 DIAGNOSIS — Z8546 Personal history of malignant neoplasm of prostate: Secondary | ICD-10-CM

## 2019-08-14 DIAGNOSIS — Z8669 Personal history of other diseases of the nervous system and sense organs: Secondary | ICD-10-CM

## 2019-08-14 DIAGNOSIS — I10 Essential (primary) hypertension: Secondary | ICD-10-CM

## 2019-08-14 DIAGNOSIS — M542 Cervicalgia: Secondary | ICD-10-CM

## 2019-08-14 DIAGNOSIS — M65341 Trigger finger, right ring finger: Secondary | ICD-10-CM

## 2019-08-14 DIAGNOSIS — M0609 Rheumatoid arthritis without rheumatoid factor, multiple sites: Secondary | ICD-10-CM | POA: Diagnosis not present

## 2019-08-14 DIAGNOSIS — M19042 Primary osteoarthritis, left hand: Secondary | ICD-10-CM

## 2019-08-14 DIAGNOSIS — M5030 Other cervical disc degeneration, unspecified cervical region: Secondary | ICD-10-CM

## 2019-08-14 DIAGNOSIS — M5136 Other intervertebral disc degeneration, lumbar region: Secondary | ICD-10-CM

## 2019-08-14 NOTE — Patient Instructions (Addendum)
Standing Labs We placed an order today for your standing lab work.    Please come back and get your standing labs in July and every 3 months  We have open lab daily Monday through Thursday from 8:30-12:30 PM and 1:30-4:30 PM and Friday from 8:30-12:30 PM and 1:30-4:00 PM at the office of Dr. Bo Merino.   You may experience shorter wait times on Monday and Friday afternoons. The office is located at [address] [place], [place], [place] [address] No appointment is necessary.   Labs are drawn by [facility].  You may receive a bill from [facility] for your lab work.  If you wish to have your labs drawn at another location, please call the office 24 hours in advance to send orders.  If you have any questions regarding directions or hours of operation,  please call [phone].   Just as a reminder please drink plenty of water prior to coming for your lab work. Thanks!    Cervical Strain and Sprain Rehab Ask your health care provider which exercises are safe for you. Do exercises exactly as told by your health care provider and adjust them as directed. It is normal to feel mild stretching, pulling, tightness, or discomfort as you do these exercises. Stop right away if you feel sudden pain or your pain gets worse. Do not begin these exercises until told by your health care provider. Stretching and range-of-motion exercises Cervical side bending  1. Using good posture, sit on a stable chair or stand up. 2. Without moving your shoulders, slowly tilt your left / right ear to your shoulder until you feel a stretch in the opposite side neck muscles. You should be looking straight ahead. 3. Hold for __________ seconds. 4. Repeat with the other side of your neck. Repeat __________ times. Complete this exercise __________ times a day. Cervical rotation  1. Using good posture, sit on a stable chair or stand up. 2. Slowly turn your head to the side as if you are looking over your left /  right shoulder. ? Keep your eyes level with the ground. ? Stop when you feel a stretch along the side and the back of your neck. 3. Hold for __________ seconds. 4. Repeat this by turning to your other side. Repeat __________ times. Complete this exercise __________ times a day. Thoracic extension and pectoral stretch 1. Roll a towel or a small blanket so it is about 4 inches (10 cm) in diameter. 2. Lie down on your back on a firm surface. 3. Put the towel lengthwise, under your spine in the middle of your back. It should not be under your shoulder blades. The towel should line up with your spine from your middle back to your lower back. 4. Put your hands behind your head and let your elbows fall out to your sides. 5. Hold for __________ seconds. Repeat __________ times. Complete this exercise __________ times a day. Strengthening exercises Isometric upper cervical flexion 1. Lie on your back with a thin pillow behind your head and a small rolled-up towel under your neck. 2. Gently tuck your chin toward your chest and nod your head down to look toward your feet. Do not lift your head off the pillow. 3. Hold for __________ seconds. 4. Release the tension slowly. Relax your neck muscles completely before you repeat this exercise. Repeat __________ times. Complete this exercise __________ times a day. Isometric cervical extension  1. Stand about 6 inches (15 cm) away from a wall, with  your back facing the wall. 2. Place a soft object, about 6-8 inches (15-20 cm) in diameter, between the back of your head and the wall. A soft object could be a small pillow, a ball, or a folded towel. 3. Gently tilt your head back and press into the soft object. Keep your jaw and forehead relaxed. 4. Hold for __________ seconds. 5. Release the tension slowly. Relax your neck muscles completely before you repeat this exercise. Repeat __________ times. Complete this exercise __________ times a day. Posture and  body mechanics Body mechanics refers to the movements and positions of your body while you do your daily activities. Posture is part of body mechanics. Good posture and healthy body mechanics can help to relieve stress in your body's tissues and joints. Good posture means that your spine is in its natural S-curve position (your spine is neutral), your shoulders are pulled back slightly, and your head is not tipped forward. The following are general guidelines for applying improved posture and body mechanics to your everyday activities. Sitting  1. When sitting, keep your spine neutral and keep your feet flat on the floor. Use a footrest, if necessary, and keep your thighs parallel to the floor. Avoid rounding your shoulders, and avoid tilting your head forward. 2. When working at a desk or a computer, keep your desk at a height where your hands are slightly lower than your elbows. Slide your chair under your desk so you are close enough to maintain good posture. 3. When working at a computer, place your monitor at a height where you are looking straight ahead and you do not have to tilt your head forward or downward to look at the screen. Standing   When standing, keep your spine neutral and keep your feet about hip-width apart. Keep a slight bend in your knees. Your ears, shoulders, and hips should line up.  When you do a task in which you stand in one place for a long time, place one foot up on a stable object that is 2-4 inches (5-10 cm) high, such as a footstool. This helps keep your spine neutral. Resting When lying down and resting, avoid positions that are most painful for you. Try to support your neck in a neutral position. You can use a contour pillow or a small rolled-up towel. Your pillow should support your neck but not push on it. This information is not intended to replace advice given to you by your health care provider. Make sure you discuss any questions you have with your health care  provider. Document Revised: 07/18/2018 Document Reviewed: 12/27/2017 Elsevier Patient Education  [place].

## 2019-08-14 NOTE — Progress Notes (Signed)
PCP: Shon Baton, MD Primary Cardiologist: Dr Percival Spanish Primary EP:  Dr Rayann Heman  Jeffery [name] is a 85 y.o. male who presents today for routine electrophysiology followup.  Since last being seen in our clinic, the patient reports doing very well.  Today, he denies symptoms of palpitations, chest pain, shortness of breath,  lower extremity edema, dizziness, presyncope, or syncope.  The patient is otherwise without complaint today.   Past Medical History:  Diagnosis Date  . Arthritis   . BCC (basal cell carcinoma)   . BPH (benign prostatic hypertrophy) 2008  . Cancer [facility]) 2008   Prostate  . Cervical stenosis of spine    per patient   . Complete heart block ([place])   . Complex sleep apnea syndrome    does not use CPAP  . Diabetes mellitus without complication ([place])   . Diverticulosis   . ED (erectile dysfunction)   . GERD (gastroesophageal reflux disease)   . Hemorrhoids   . Hernia, inguinal   . Hyperlipidemia   . IFG (impaired fasting glucose)   . Osteopenia   . Peptic ulcer disease with hemorrhage 1993  . Shingles   . Vitamin D deficiency    Past Surgical History:  Procedure Laterality Date  . BACK SURGERY N/A 1984  . CATARACT EXTRACTION, BILATERAL    . HERNIA REPAIR    . LAMINECTOMY  1984   L 5  . PACEMAKER IMPLANT  12/05/2016   MDT Azure XT DR MRI conditional PPM implanted for CHB by Dr Vista Deck in [place]  . PACEMAKER INSERTION  12/05/2016  . PROSTATE SURGERY  12/2006   Robotic  . TONSILLECTOMY  85 years old  . TRIGGER FINGER RELEASE  03/01/2012   Procedure: MINOR RELEASE TRIGGER FINGER/A-1 PULLEY;  Surgeon: Cammie Sickle., MD;  Location: [place];  Service: Orthopedics;  Laterality: Left;  long finger  . TRIGGER FINGER RELEASE Left 02/08/2018   Procedure: RELEASE TRIGGER FINGER/A-1 PULLEY LEFT RING FINGER;  Surgeon: Daryll Brod, MD;  Location: [place];  Service: Orthopedics;  Laterality: Left;    ROS- all systems are  reviewed and negative except as per HPI above  Current Outpatient Medications  Medication Sig Dispense Refill  . acetaminophen (TYLENOL) 500 MG tablet Take 500 mg by mouth every 6 (six) hours as needed.    . Calcium Carbonate (CALCIUM 600 PO) Take 600 mg by mouth daily.    . Cholecalciferol (VITAMIN D) 2000 units CAPS Take by mouth daily.    . Cholecalciferol (VITAMIN D3) 1.25 MG (50000 UT) CAPS TAKE 1 CAPSULE BY MOUTH ONCE PER MONTH 12 capsule 0  . Coenzyme Q10 200 MG capsule Take 200 mg by mouth 2 (two) times daily.     . colesevelam (WELCHOL) 625 MG tablet Take 1,250 mg by mouth 3 (three) times daily.     . diclofenac sodium (VOLTAREN) 1 % GEL Apply 2-4 grams to affected joint, up to 4 times daily. [id number] g 2  . folic acid (FOLVITE) 1 MG tablet TAKE 1 TABLET EVERY DAY 90 tablet 3  . hydroxychloroquine (PLAQUENIL) 200 MG tablet Take 1 tablet (200 mg total) by mouth 2 (two) times daily. 180 tablet 0  . metFORMIN (GLUCOPHAGE) 500 MG tablet Take 500 mg daily by mouth.     . methocarbamol (ROBAXIN) 500 MG tablet Take 1 tablet (500 mg total) by mouth 2 (two) times daily as needed for muscle spasms. 14 tablet 0  . Methotrexate Sodium (  METHOTREXATE, PF,) 50 MG/2ML injection INJECT 0.6 MLS INTO THE SKIN ONCE A WEEK 8 mL 0  . omega-3 acid ethyl esters (LOVAZA) 1 g capsule TAKE 1 CAPSULE BY MOUTH 3 TIMES A WEEK 36 capsule 0  . ONE TOUCH ULTRA TEST test strip TEST BLOOD SUGAR BID AS DIRECTED  2  . TUBERCULIN SYR 1CC/27GX1/2" (B-D TB SYRINGE 1CC/27GX1/2") 27G X 1/2" 1 ML MISC 12 Syringes by Does not apply route once a week. 12 each 3  . vitamin E 100 UNIT capsule Take 100 Units by mouth daily.     No current facility-administered medications for this visit.    Physical Exam: Vitals:   08/14/19 1504  BP: 122/70  Pulse: 87  SpO2: 95%  Weight: 169 lb (76.7 kg)  Height: 6\' 2"  (1.88 m)    GEN- The patient is well appearing, alert and oriented x 3 today.   Head- normocephalic, atraumatic Eyes-   Sclera clear, conjunctiva pink Ears- hearing intact Oropharynx- clear Lungs- normal work of breathing Chest- pacemaker pocket is well healed Heart- Regular rate and rhythm  GI- soft  Extremities- no clubbing, cyanosis, or edema  Pacemaker interrogation- reviewed in detail today,  See PACEART report  ekg tracing ordered today is personally reviewed and shows sinus with V pacing  Echo- EF normal  Assessment and Plan:  1. Symptomatic  complete heart block Normal pacemaker function See Pace Art report No changes today he is device dependant today  2. HTN Stable No change required today  3. OSA Treated with CPAP  Return in a year  Thompson Grayer MD, [facility] 08/14/2019 3:30 PM

## 2019-08-15 ENCOUNTER — Ambulatory Visit (INDEPENDENT_AMBULATORY_CARE_PROVIDER_SITE_OTHER): Payer: Medicare Other | Admitting: Neurology

## 2019-08-15 DIAGNOSIS — Z9989 Dependence on other enabling machines and devices: Secondary | ICD-10-CM

## 2019-08-15 DIAGNOSIS — G4731 Primary central sleep apnea: Secondary | ICD-10-CM | POA: Diagnosis not present

## 2019-08-19 ENCOUNTER — Ambulatory Visit: Payer: Self-pay | Admitting: Neurology

## 2019-08-22 ENCOUNTER — Telehealth: Payer: Self-pay | Admitting: Neurology

## 2019-08-22 NOTE — Telephone Encounter (Signed)
pt was scheduled for 5/20 to come in and discuss the sleep study results with Dr Brett Fairy. Pt wanted to be seen sooner if something opened up. We had 2 cancellations for Monday. I placed the 11 am on hold for the patient and left a message advising of this information.  If pt calls back please schedule for one of the opening that were available for may 17th

## 2019-08-22 NOTE — Telephone Encounter (Signed)
Pt called and accepted the Monday at 11 am, I will inform Dr Brett Fairy so that his study is read prior to his visit.

## 2019-08-24 DIAGNOSIS — Z9989 Dependence on other enabling machines and devices: Secondary | ICD-10-CM | POA: Insufficient documentation

## 2019-08-24 DIAGNOSIS — G4731 Primary central sleep apnea: Secondary | ICD-10-CM | POA: Insufficient documentation

## 2019-08-24 NOTE — Progress Notes (Signed)
DIAGNOSIS 1. Complex Sleep Apnea and PLM-Disorder were much improved under use of BiPAP 15/11 cm water. 2. Reduction in Sleep Related Hypoxemia time under BiPAP was notable.  Supine REM sleep still allowed for some hypoxemia.    PLANS/RECOMMENDATIONS: An Auto-titration BiPAP with a range between 14/10 cm water through 19/15 cm water will be prescribed.   Patient will be asked to avoid the supine sleep position. Due to the patient's pacemaker we were asked to choose a non-magnetic clip headgear and interface - the Vitera FF medium - he also used the [employer] FFM in medium size.

## 2019-08-24 NOTE — Procedures (Signed)
PATIENT'S NAME:  Jeffery [name]., Jeffery [name] DOB:      04-03-1935      MR#:    [id number]     DATE OF RECORDING: 08/15/2019  Rudene Christians REFERRING M.D.:  Shon Baton MD Study Performed:   Titration to positive airway pressure.  HISTORY:   [name], Jeffery [name]. is a meanwhile 85 year old Caucasian gentleman who has been followed in my practice for neuropathy.  It was his ophthalmologist, Dr. Zadie Rhine, who referred him specifically for a sleep study after he noted telangiectasis - congested retinal blood vessels.  The patient underwent a sleep study which confirmed a complex type sleep apnea. A previous sleep study in 2016 which had reported primary central sleep apnea let to initiation of CPAP therapy, and within less than a month the ophthalmological findings had resolved. He now has rising AHI levels while adhering to CPAP therapy.   The therapeutic data does show an insufficient reduction of apnea there 2.7 central apneas and 9.2 obstructive apneas per hour on average a total AHI of 14/[name]. 56 minutes each night are spent with Cheyne-Stokes respirations.  If CPAP is not longer effective, I will need a titration to BiPAP modality.    Assessment and Plan: decrease EPR to 2 cm from 3 cm water - if AHI goes down, increase pressure window by 1 or 2 cm water. If AHI increases, change to BIPAP.    The patient endorsed the Epworth Sleepiness Scale at 18 points.   The patient's weight 167 pounds with a height of 74 (inches), resulting in a BMI of 21.5 kg/m2. The patient's neck circumference measured 15 inches.  CURRENT MEDICATIONS: Tylenol, Calcium, Vitamin D, CoQ10, Welchol, D3-50, Flector, Voltaren, Folvite, Plaquenil, Glucophage, Rasuvo, Lovaza, Vitamin E  PROCEDURE:  This is a multichannel digital polysomnogram utilizing the SomnoStar 11.2 system.  Electrodes and sensors were applied and monitored per AASM Specifications.   EEG, EOG, Chin and Limb EMG, were sampled at 200 Hz.  ECG, Snore and Nasal Pressure, Thermal Airflow,  Respiratory Effort, CPAP Flow and Pressure, Oximetry was sampled at 50 Hz. Digital video and audio were recorded.      Due to the patient's pacemaker we were choosing a non-magnetic clip headgear and interface - the Vitera FF medium - he also used the [employer] FFM in medium size.  CPAP was initiated first, beginning at 5 cmH20 with heated humidity per AASM split night standards and pressure was advanced to 14 cmH20 because of hypopneas, apneas and desaturations. CPAP did no longer reduce the AHI and a change to BiPAP was made, beginning at 14/10 cm water - residual AHI was now 1.5/[name]. the best result was seen under 15/11 cm water BiPAP pressure.    At a PAP pressure of 15/11 cmH20, there was a reduction of the AHI to 1.4/[name] with improvement of sleep apnea. Lights Out was at 22:32 and Lights On at 05:12. Total recording time (TRT) was 400.5 minutes, with a total sleep time (TST) of 274.5 minutes. The patient's sleep latency was 56 minutes. REM latency was 167 minutes.  The sleep efficiency was 68.5 %.    SLEEP ARCHITECTURE: WASO (Wake after sleep onset) was 117.5 minutes.  There were 51.5 minutes in Stage N1, 155.5 minutes Stage N2, 27.5 minutes Stage N3 and 40 minutes in Stage REM.  The percentage of Stage N1 was 18.8%, Stage N2 was 56.6%, Stage N3 was 10.% and Stage R (REM sleep) was 14.6%.    RESPIRATORY ANALYSIS:  There was a  total of 44 respiratory events: 24 obstructive apneas, 0 central apneas and 1 mixed apneas with a total of 25 apneas and an apnea index (AI) of 5.5 /hour. There were 19 hypopneas with a hypopnea index of 4.2/hour.  The total APNEA/HYPOPNEA INDEX (AHI) was 9.6 /hour.  2 events occurred in REM sleep and 42 events in NREM. The REM AHI was 3 /hour versus a non-REM AHI of 10.7 /hour.  The patient spent 99 minutes of total sleep time in the supine position and 176 minutes in non-supine.  The supine AHI was 25.5, versus a non-supine AHI of 0.7.  OXYGEN SATURATION & C02:  The baseline  02 saturation was 93%, with the lowest being 84%. Time spent below 89% saturation equaled 24 minutes.  The arousals were noted as: 17 were spontaneous, 34 were associated with PLMs, and 22 were associated with respiratory events. The patient had a total of 433 Periodic Limb Movements. The overall Periodic Limb Movement (PLM) Arousal index remained high at 7.4 /hour but the patient experienced very few PLMs under the selected BiPAP pressure and entered REM sleep.   Audio and video analysis did not show any abnormal or unusual movements, behaviors, phonations or vocalizations.  EKG was in keeping with paced rhythm with isolated PACs.   DIAGNOSIS 1. Complex Sleep Apnea and PLM-Disorder were much improved under use of BiPAP 15/11 cm water. 2. Reduction in Sleep Related Hypoxemia time under BiPAP was notable.  Supine REM sleep still allowed for some hypoxemia.    PLANS/RECOMMENDATIONS: An Auto-titration BiPAP with a range between 14/10 cm water through 19/15 cm water will be prescribed.   Patient will be asked to avoid the supine sleep position. Due to the patient's pacemaker we were asked to choose a non-magnetic clip headgear and interface - the Vitera FF medium - he also used the [employer] FFM in medium size.    DISCUSSION: A follow up appointment will be scheduled in the Sleep Clinic at [facility].   Please call [phone] with any questions.     I certify that I have reviewed the entire raw data recording prior to the issuance of this report in accordance with the Standards of Accreditation of the American Academy of Sleep Medicine (AASM)  Larey Seat, M.D. Diplomat, Tax adviser of Psychiatry and Neurology  Diplomat, Tax adviser of Sleep Medicine Market researcher, [employer] [facility] at [employer]

## 2019-08-24 NOTE — Addendum Note (Signed)
Addended by: Larey Seat on: 08/24/2019 06:58 PM   Modules accepted: Orders

## 2019-08-26 ENCOUNTER — Encounter: Payer: Self-pay | Admitting: Neurology

## 2019-08-26 ENCOUNTER — Ambulatory Visit (INDEPENDENT_AMBULATORY_CARE_PROVIDER_SITE_OTHER): Payer: Medicare Other | Admitting: Ophthalmology

## 2019-08-26 ENCOUNTER — Ambulatory Visit (INDEPENDENT_AMBULATORY_CARE_PROVIDER_SITE_OTHER): Payer: Medicare Other | Admitting: Neurology

## 2019-08-26 ENCOUNTER — Encounter (INDEPENDENT_AMBULATORY_CARE_PROVIDER_SITE_OTHER): Payer: Self-pay | Admitting: Ophthalmology

## 2019-08-26 ENCOUNTER — Other Ambulatory Visit: Payer: Self-pay

## 2019-08-26 VITALS — BP 125/73 | HR 82 | Temp 97.2°F | Ht 74.0 in | Wt 171.0 lb

## 2019-08-26 DIAGNOSIS — Z961 Presence of intraocular lens: Secondary | ICD-10-CM | POA: Diagnosis not present

## 2019-08-26 DIAGNOSIS — I4510 Unspecified right bundle-branch block: Secondary | ICD-10-CM | POA: Diagnosis not present

## 2019-08-26 DIAGNOSIS — Z950 Presence of cardiac pacemaker: Secondary | ICD-10-CM | POA: Diagnosis not present

## 2019-08-26 DIAGNOSIS — H35351 Cystoid macular degeneration, right eye: Secondary | ICD-10-CM | POA: Diagnosis not present

## 2019-08-26 DIAGNOSIS — G629 Polyneuropathy, unspecified: Secondary | ICD-10-CM | POA: Diagnosis not present

## 2019-08-26 DIAGNOSIS — H33321 Round hole, right eye: Secondary | ICD-10-CM

## 2019-08-26 DIAGNOSIS — H35071 Retinal telangiectasis, right eye: Secondary | ICD-10-CM | POA: Diagnosis not present

## 2019-08-26 DIAGNOSIS — G4731 Primary central sleep apnea: Secondary | ICD-10-CM

## 2019-08-26 DIAGNOSIS — G4733 Obstructive sleep apnea (adult) (pediatric): Secondary | ICD-10-CM | POA: Insufficient documentation

## 2019-08-26 NOTE — Progress Notes (Addendum)
[name]  Provider:  Larey Seat, MD.   Referring Provider: Shon Baton, MD Primary Care Physician:  Shon Baton, MD  Chief Complaint  Patient presents with  . Follow-up    pt alone, rm 11. presents today to discuss SS results and next steps. pt wanted to come in to discuss. DME [employer]   RV on 08-26-2019; I have the pleasure of meeting with Jeffery [name] today on 26 Aug 2019 patient is an 85 year old Caucasian gentleman who has been followed for CPAP therapy as well as neuropathy.  Previous sleep studies had found primary central sleep apnea which responded to CPAP therapy but was in the last several months they have been rising levels of residual AHI currently to 8.45 using CPAP as an AutoSet.  Therefore the patient was invited to return and see if he can actually titrate him to BiPAP.  We used a nonmagnetic Clapacs near the right ear a full face medium and first began with a CPAP titration from 5-14 cmH2O it did not reduce the AHI significantly changed to BiPAP was made beginning at 14/10 cmH2O and immediately reducing the residual apnea to 1.5/h the best result was seen on 15/11 centimeters water pressure it seems that for this complex sleep apnea and for the periodic limb movements BiPAP therapy controlled both.  Also there was a reduction of the hypoxemia time to 29 to 24 minutes only which is much less than in the sleep study.  I would very much be in favor to change therapy to BiPAP and if possible to use a range between 14/10 up to 19/15 cmH2O to allow also adjustment depending on supine or nonsupine sleep position.he is between medium and large size for the Vitera mask.   I expected him to be awaiting the BiPAP to be issued.  He would consider rather bying ASV outright. He needs to return for a night. ASV.  I will explore.    [name], 18, presents after he had an extensive visit with his ophthalmologist and retinal specialist , Dr. Zadie Rhine, Whom he sees for  Plaquenil follow up. 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 Zadie Rhine asked the patient if he snores which she confirmed.  He also stated that treatment of obstructive sleep apnea may within a week resolve or alleviate the Tele-angiectasis.  Aside from the concern of snoring, there is also excessive daytime sleepiness loaders now the patient endorsed the Epworth Sleepiness Scale at 16 out of 24 possible points, but fatigue severity was only endorsed at 39 points.The patient's body mass index has not increased, but decreased. He remains with a deviated septum.  [name] had been seen in 2011-sleep study obtained on 24 Aug 2009 confirmed an AHI also known as apnea hypopnea index of 27.7/h of sleep, equal to almost 1 respiratory event every 2 minutes.  Supine there was much accentuation to a supine AHI of 75.6/h, there was only slight accentuation in rem sleep.  Interesting is that the patient had 8 obstructive apneas alongside 124 central and 16 mixed apneas.  There were very few hypopneas.  Central apnea however is not always responsive to CPAP.  He returned for a CPAP and BiPAP titration on 14 September 2009 and did not tolerate or respond well to pressures between 4 and 8 cmH2O CPAP pressure.  Subsequently BiPAP was initiated he tried several masks settling  on a full facemask at the most is the most acceptable.  There was no optimal pressure on BiPAP found either. He just couldn't sleep. Dr Lonzo Cloud interpreted and recommended 7 cm CPAP as the best solution.   [name] is seen on 23 February 2017 and a follow-up visit after recent cervical spine MRI was obtained.  In the meantime he has received a pacemaker, and is followed by Dr. Rayann Heman through [facility].  Symptoms occurred in [place], [place] where he was diagnosed and his care was transferred here, he received a  temporary pacemaker in [place] .  Treatment for severe bradycardia. diaphoretic and with palpitation, bowel incontinence.  In the interim he had on 11-28-2016 an MRI - he called Dr. Sherwood Gambler and has not made a follow up with him. Post-pacemaker implantation he had left arm and wrist pain, was treated with prednisone and has not had symptoms in neck and hands since! Marland Kitchen   08-21-2017, well adjusted to his pacemaker, no complications. No syncope, remains numb behind the ear. We will follow his complex apnea, neuropathy as needed.  No concerns of sleep , no pain.    11-01-2016. Jeffery [name] is a 85 y.o. male seen here as a revisit  from Dr. Virgina Jock for a newly noted area of dysesthesia behind the left ear.  There is no numbness- the skin sensation is intermittently disturbed, tingly, like a fine touch. I evaluated the area and it does not quite fit a dermatomal distribution-Area is retroauricular and would be innervated by C2. There is no spasm.   This patient of Dr. Virgina Jock Dr. Estanislado Pandy has followed up since May 2011. Initially he was referred for a polysomnogram based on chronic fatigue complaints and was diagnosed as complex apnea.  The apnea short-billed complements central and obstructive, but in a distribution that was more typical for a with apnea.  They were neuropathy related labs sent out and 2011 as well as also to address chronic fatigue and all returned to normal range. The patient clearly has an accentuated AHI by position and during REM sleep. He has been healthy and maintains an active lifestyle is traveling is a nonsmoker nondrinker and has nausea history. The patient responded well to Wellbutrin on only 100 mg daily taken to eliminate or reduce central apneas. During his last visit in 2013 his fatigue score was 20 and his Epworth score 14 points and he had reported one nocturia on average per night. He estimated his daily sleep time is 7 hours or more. He sometimes naps in the afternoons.  This is still the case today:  his Epworth is 11 points, and fatigue score is 24. I would like to add that the patient is on chronic Plaquenil therapy. This was initiated after he developed severe muscle stiffness and relief this is the symptoms within 2-3 weeks. 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 Plaquenil  may contribute somewhat to the development of the neuropathy. Wellbutrin has had some positive effect on the level of alertness and daytime and may also be due discomfort.  03-03-15 Jeffery [name] reports that he had taken a drug holiday and for about 6 months did not use Plaquenil. As the responds he noted again stiffness in his hands and inflammation. Dr. Estanislado Pandy , his rheumatologist, had placed him on his prednisone taper but then Plaquenil was reinitiated about 3 months ago. I would like to add that the stiffness  has never affected his lower extremities or feet and has always been manifested in his hands. He also has 3 trigger fingers. Fatigue is stable. His Epworth score is endorsed at 9 points and his fatigue severity at 34 points. The geriatric depression score was  endorsed at 2 points.  We are seeing today after Jeffery [name] suffered a sudden fall with a laceration of the left for head and fracture of the left thumb. He was walking briskly but on an uneven surface and initially believed that he tripped and fell. But he had as sudden premonition to slow down,  before he fell which tells me that he most likely fainted. He has no history of seizures, he was able to break his fall but had no focal deficits [name] is a fall and also did not feel that he had problems was generating words, but his thought process was scrambled or that he had any focal motor deficits. This rules out a TIA.  Once he was on the ground he was immediately conscious and aware of his surroundings- he was not combative and  he will actually waited patiently for EMS to arrive. The ED reports were reviewed here with him. A cardiac workup was initiated and his transthoracic echo cardiogram revealed no abnormalities.  He also underwent carotid Doppler studies which showed minor in my opinion trivial calcifications at the bifurcation. These would be in the range between 1 and 40% the nonsurgical indication range. I would like for the patient to take a baby aspirin is possibly enteric coated once a day should he develop bruising I will like him to take it every other day. I also discussed with him that we have never established at baseline for his neuropathy. I would like for him as early as available in the new year to undergo an EMG and nerve conduction study to evaluate the degree of peripheral neuropathy and axonal versus demyelinating quality of the neuropathy. The patient is interested. A recent comprehensive metabolic panel was Dr. Franchot Gallo was interpreted as normal , as well as a CBC diff  which she has every 3 months.   Review of Systems: Out of a complete 14 system review, the patient complains of only the following symptoms, and all other reviewed systems are negative. Intermittent fatigue. Fatigue is stable. His Epworth score is endorsed at 7 points and his fatigue severity at 27 points.  The geriatric depression score was  endorsed at 2 points. night blindness. Dysesthesias resolved under steroids, bradycardia on pacemaker.    Eye clinic at [facility] - Plaquenil (Hydroxychloroquine) Screening  I, the attending physician, performed the HPI with the patient and updated the documentation appropriately. The patient is here for a return visit. The patient is here today for Plaquenil (Hydroxychloroquine) screening. Patient has Rheumatoid Arthritis. The patient has been taking Plaquenil (Hydroxychloroquine) for more than 5 years. The patient has been taking Plaquenil (Hydroxychloroquine) for approximately 7 years. The  patient reports no changes in their vision. Known co-existing eye disease: macular degeneration. The patient does not request a new glasses prescription. Review of eye symptoms reveals: Negative for flashes, eye pain and floaters.  Last edited by Hyman Hopes, MD on 08/17/2017 3:35 PM. (History)     Social History   Socioeconomic History  . Marital status: Married    Spouse name: Not on file  . Number of children: 2  . Years of education: Not on file  . Highest education level: Not on file  Occupational History  .  Not on file  Tobacco Use  . Smoking status: Never Smoker  . Smokeless tobacco: Never Used  Substance and Sexual Activity  . Alcohol use: Not Currently  . Drug use: No  . Sexual activity: Not on file  Other Topics Concern  . Not on file  Social History Narrative   Right handed, Retired .Married, 2 kids. Caffeine none.   3 Step    Social Determinants of Health   Financial Resource Strain:   . Difficulty of Paying Living Expenses:   Food Insecurity:   . Worried About Charity fundraiser in the Last Year:   . Arboriculturist in the Last Year:   Transportation Needs:   . Film/video editor (Medical):   Marland Kitchen Lack of Transportation (Non-Medical):   Physical Activity:   . Days of Exercise per Week:   . Minutes of Exercise per Session:   Stress:   . Feeling of Stress :   Social Connections:   . Frequency of Communication with Friends and Family:   . Frequency of Social Gatherings with Friends and Family:   . Attends Religious Services:   . Active Member of Clubs or Organizations:   . Attends Archivist Meetings:   Marland Kitchen Marital Status:   Intimate Partner Violence:   . Fear of Current or Ex-Partner:   . Emotionally Abused:   Marland Kitchen Physically Abused:   . Sexually Abused:     Family History  Problem Relation Age of Onset  . Heart failure Mother        Died age 72.  Apparently had valve surgery.   . Congenital heart disease Brother     Past Medical  History:  Diagnosis Date  . Arthritis   . BCC (basal cell carcinoma)   . BPH (benign prostatic hypertrophy) 2008  . Cancer [facility]) 2008   Prostate  . Cervical stenosis of spine    per patient   . Complete heart block ([place])   . Complex sleep apnea syndrome    does not use CPAP  . Diabetes mellitus without complication ([place])   . Diverticulosis   . ED (erectile dysfunction)   . GERD (gastroesophageal reflux disease)   . Hemorrhoids   . Hernia, inguinal   . Hyperlipidemia   . IFG (impaired fasting glucose)   . Osteopenia   . Peptic ulcer disease with hemorrhage 1993  . Shingles   . Vitamin D deficiency     Past Surgical History:  Procedure Laterality Date  . BACK SURGERY N/A 1984  . CATARACT EXTRACTION, BILATERAL    . HERNIA REPAIR    . LAMINECTOMY  1984   L 5  . PACEMAKER IMPLANT  12/05/2016   MDT Azure XT DR MRI conditional PPM implanted for CHB by Dr Vista Deck in [place]  . PACEMAKER INSERTION  12/05/2016  . PROSTATE SURGERY  12/2006   Robotic  . TONSILLECTOMY  85 years old  . TRIGGER FINGER RELEASE  03/01/2012   Procedure: MINOR RELEASE TRIGGER FINGER/A-1 PULLEY;  Surgeon: Cammie Sickle., MD;  Location: [facility];  Service: Orthopedics;  Laterality: Left;  long finger  . TRIGGER FINGER RELEASE Left 02/08/2018   Procedure: RELEASE TRIGGER FINGER/A-1 PULLEY LEFT RING FINGER;  Surgeon: Daryll Brod, MD;  Location: [facility];  Service: Orthopedics;  Laterality: Left;    Current Outpatient Medications  Medication Sig Dispense Refill  . acetaminophen (TYLENOL) 500 MG tablet Take 500 mg by mouth every 6 (six)  hours as needed.    . Calcium Carbonate (CALCIUM 600 PO) Take 600 mg by mouth daily.    . Cholecalciferol (VITAMIN D) 2000 units CAPS Take by mouth daily.    . Cholecalciferol (VITAMIN D3) 1.25 MG (50000 UT) CAPS TAKE 1 CAPSULE BY MOUTH ONCE PER MONTH 12 capsule 0  . Coenzyme Q10 200 MG capsule Take 200 mg by mouth 2 (two) times daily.      . colesevelam (WELCHOL) 625 MG tablet Take 1,250 mg by mouth 3 (three) times daily.     . diclofenac sodium (VOLTAREN) 1 % GEL Apply 2-4 grams to affected joint, up to 4 times daily. (Patient taking differently: Apply 2-4 grams to affected joint, up to 4 times daily as needed) [id number] g 2  . folic acid (FOLVITE) 1 MG tablet TAKE 1 TABLET EVERY DAY 90 tablet 3  . hydroxychloroquine (PLAQUENIL) 200 MG tablet Take 1 tablet (200 mg total) by mouth 2 (two) times daily. 180 tablet 0  . metFORMIN (GLUCOPHAGE) 500 MG tablet Take 500 mg daily by mouth.     . Methotrexate Sodium (METHOTREXATE, PF,) 50 MG/2ML injection INJECT 0.6 MLS INTO THE SKIN ONCE A WEEK 8 mL 0  . omega-3 acid ethyl esters (LOVAZA) 1 g capsule TAKE 1 CAPSULE BY MOUTH 3 TIMES A WEEK 36 capsule 0  . ONE TOUCH ULTRA TEST test strip TEST BLOOD SUGAR BID AS DIRECTED  2  . TUBERCULIN SYR 1CC/27GX1/2" (B-D TB SYRINGE 1CC/27GX1/2") 27G X 1/2" 1 ML MISC 12 Syringes by Does not apply route once a week. 12 each 3  . vitamin E 100 UNIT capsule Take 100 Units by mouth daily.     No current facility-administered medications for this visit.    Allergies as of 08/26/2019 - Review Complete 08/26/2019  Allergen Reaction Noted  . Sulfasalazine Anaphylaxis 02/08/2016  . Demerol [meperidine] Nausea Only 03/01/2012  . Garlic  [id number]  . Other  09/25/2017  . Sulfa antibiotics  02/23/2017    Vitals: BP 125/73   Pulse 82   Temp (!) 97.2 F (36.2 C)   Ht 6\' 2"  (1.88 m)   Wt 171 lb (77.6 kg)   BMI 21.96 kg/m  Last Weight:  Wt Readings from Last 1 Encounters:  08/26/19 171 lb (77.6 kg)   Last Height:   Ht Readings from Last 1 Encounters:  08/26/19 6\' 2"  (1.88 m)   DOB: 02/14/1935 MRN: [id number]   Physical exam: General: The patient is awake, alert and appears not in acute distress. The patient is well groomed. Head: Normocephalic, atraumatic. Neck is supple. Mallampati 2 , neck circumference: 15 inches.   Cardiovascular:  Regular  rate and rhythm, without  murmurs -without distended neck veins. Respiratory: Lungs are clear to auscultation. Skin:  Without evidence of edema, or rash Trunk: BMI is normal. He lost weight by design.   Neurologic exam: The patient is awake and alert, oriented to place and time.   Memory subjective described as intact. There is a normal attention span & concentration ability.  Speech is fluent without dysarthria, dysphonia or aphasia.  Mood and affect are appropriate.  Cranial nerves: Pupils are equal and briskly reactive to light.  Funduscopic exam - quoted from Dr. Radene Ou notes.  Extraocular movements  in vertical and horizontal planes intact and without nystagmus. Visual fields by finger perimetry are intact.Hearing to finger rub intact.Facial sensation intact to fine touch.  Facial motor strength is symmetric , his  tongue and uvula move midline. Motor  exam:  Low muscle mass.   Deferred.left grip strength is limited after trigger finger surgery.  Sensory:   I did not test the planta pedis.  Coordination: Rapid alternating movements in the fingers/hands is normal.  Finger-to-nose maneuver tested and normal without evidence of ataxia, dysmetria or tremor. Gait and station:  Stance is stable and normal. Deep tendon reflexes: in the extremities are attenuated , symmetric and intact.   Assessment:   20 minute discussion-  After physical and neurologic examination, review of laboratory studies, imaging, neurophysiology testing and pre-existing records, assessment is   1) EDS- Epworth 18 points - highly elevated.  Central dominant Sleep Apnea was found  in 2011 - now his ophthalmologist noted retinal changes suggesting a correlation to untreated sleep apnea. I like for him to have an ASAP attended split night study.  He has not responded any longer to CPAP, and was just titrated to BiPAP.   2) new onset dysphagia- delayed clearing of the throat. Not for liquid.  He has some cervical  stenosis, but also stiffness, tension. Dr Dyke Brackett sees him.  He is awaiting consult with London Pepper, MD . Neck therapy, PT in the meantime.   3) neuropathy - question if related to autoimmune sytem disorder or plaquenil. Mouth dryness.  Recommended Oasis mouth wash- Biotin. No PLMs when apnea controlled.    Plan:  Treatment plan and additional workup :   ASAP ASV titration with Beau, please.   Larey Seat, MD   CC Dyke Brackett, MD and Shon Baton, MD

## 2019-08-26 NOTE — Progress Notes (Signed)
08/26/2019     CHIEF COMPLAINT Patient presents for Retina Follow Up   HISTORY OF PRESENT ILLNESS: Jeffery [name] is a 85 y.o. 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  In both eyes.  Severity is moderate.  Duration of 1 year.  Since onset it is stable.  I, the attending physician,  performed the HPI with the patient and updated documentation appropriately.          Comments    1 Year f\u OU. OCT and FP  Pt states vision is stable. Denies FOL and floaters. Pt is still using CPAP       Last edited by Jeffery [name] on 08/26/2019  3:14 PM. (History)      Referring physician: Shon Baton, MD [address] [place],  [place] [address]  HISTORICAL INFORMATION:   Selected notes from the [facility]: No current outpatient medications on file. (Ophthalmic Drugs)   No current facility-administered medications for this visit. (Ophthalmic Drugs)   Current Outpatient Medications (Other)  Medication Sig  . acetaminophen (TYLENOL) 500 MG tablet Take 500 mg by mouth every 6 (six) hours as needed.  . Calcium Carbonate (CALCIUM 600 PO) Take 600 mg by mouth daily.  . Cholecalciferol (VITAMIN D) 2000 units CAPS Take by mouth daily.  . Cholecalciferol (VITAMIN D3) 1.25 MG (50000 UT) CAPS TAKE 1 CAPSULE BY MOUTH ONCE PER MONTH  . Coenzyme Q10 200 MG capsule Take 200 mg by mouth 2 (two) times daily.   . colesevelam (WELCHOL) 625 MG tablet Take 1,250 mg by mouth 3 (three) times daily.   . diclofenac sodium (VOLTAREN) 1 % GEL Apply 2-4 grams to affected joint, up to 4 times daily. (Patient taking differently: Apply 2-4 grams to affected joint, up to 4 times daily as needed)  . folic acid (FOLVITE) 1 MG tablet TAKE 1 TABLET EVERY DAY  . hydroxychloroquine (PLAQUENIL) 200 MG tablet Take 1 tablet (200 mg total) by mouth 2 (two) times daily.  . metFORMIN (GLUCOPHAGE) 500  MG tablet Take 500 mg daily by mouth.   . Methotrexate Sodium (METHOTREXATE, PF,) 50 MG/2ML injection INJECT 0.6 MLS INTO THE SKIN ONCE A WEEK  . omega-3 acid ethyl esters (LOVAZA) 1 g capsule TAKE 1 CAPSULE BY MOUTH 3 TIMES A WEEK  . ONE TOUCH ULTRA TEST test strip TEST BLOOD SUGAR BID AS DIRECTED  . TUBERCULIN SYR 1CC/27GX1/2" (B-D TB SYRINGE 1CC/27GX1/2") 27G X 1/2" 1 ML MISC 12 Syringes by Does not apply route once a week.  . vitamin E 100 UNIT capsule Take 100 Units by mouth daily.   No current facility-administered medications for this visit. (Other)      REVIEW OF SYSTEMS:    ALLERGIES Allergies  Allergen Reactions  . Sulfasalazine Anaphylaxis  . Demerol [Meperidine] Nausea Only  . Garlic   . Other     shallots  . Sulfa Antibiotics     PAST MEDICAL HISTORY Past Medical History:  Diagnosis Date  . Arthritis   . BCC (basal cell carcinoma)   . BPH (benign prostatic hypertrophy) 2008  . Cancer [facility]) 2008   Prostate  . Cervical stenosis of spine    per patient   . Complete heart block ([place])   . Complex sleep apnea syndrome    does not use CPAP  .  Diabetes mellitus without complication ([place])   . Diverticulosis   . ED (erectile dysfunction)   . GERD (gastroesophageal reflux disease)   . Hemorrhoids   . Hernia, inguinal   . Hyperlipidemia   . IFG (impaired fasting glucose)   . Osteopenia   . Peptic ulcer disease with hemorrhage 1993  . Shingles   . Vitamin D deficiency    Past Surgical History:  Procedure Laterality Date  . BACK SURGERY N/A 1984  . CATARACT EXTRACTION, BILATERAL    . HERNIA REPAIR    . LAMINECTOMY  1984   L 5  . PACEMAKER IMPLANT  12/05/2016   MDT Azure XT DR MRI conditional PPM implanted for CHB by Dr Vista Deck in [place]  . PACEMAKER INSERTION  12/05/2016  . PROSTATE SURGERY  12/2006   Robotic  . TONSILLECTOMY  85 years old  . TRIGGER FINGER RELEASE  03/01/2012   Procedure: MINOR RELEASE TRIGGER FINGER/A-1 PULLEY;  Surgeon: Cammie Sickle., MD;  Location: [facility];  Service: Orthopedics;  Laterality: Left;  long finger  . TRIGGER FINGER RELEASE Left 02/08/2018   Procedure: RELEASE TRIGGER FINGER/A-1 PULLEY LEFT RING FINGER;  Surgeon: Daryll Brod, MD;  Location: [facility];  Service: Orthopedics;  Laterality: Left;    FAMILY HISTORY Family History  Problem Relation Age of Onset  . Heart failure Mother        Died age 17.  Apparently had valve surgery.   . Congenital heart disease Brother     SOCIAL HISTORY Social History   Tobacco Use  . Smoking status: Never Smoker  . Smokeless tobacco: Never Used  Substance Use Topics  . Alcohol use: Not Currently  . Drug use: No         OPHTHALMIC EXAM: Base Eye Exam    Visual Acuity (Snellen - Linear)      Right Left   Dist [place] 20/30 + 20/60   Dist ph   20/50 -2       Tonometry (Tonopen, 3:19 PM)      Right Left   Pressure 10 10       Pupils      Pupils Dark Light Shape React APD   Right PERRL 5 4 Round Slow None   Left PERRL 5 4 Round Slow None       Visual Fields (Counting fingers)      Left Right   Restrictions Partial outer superior temporal, inferior temporal, superior nasal, inferior nasal deficiencies Partial outer superior temporal, inferior temporal, superior nasal, inferior nasal deficiencies       Neuro/Psych    Oriented x3: Yes   Mood/Affect: Normal       Dilation    Both eyes: 1.0% Mydriacyl, 2.5% Phenylephrine @ 3:19 PM        Slit Lamp and Fundus Exam    Slit Lamp Exam      Right Left   Lens Posterior chamber intraocular lens Posterior chamber intraocular lens          IMAGING AND PROCEDURES  Imaging and Procedures for 08/26/19           ASSESSMENT/PLAN:  No problem-specific Assessment & Plan notes found for this encounter.      ICD-10-CM   1. Round hole of right eye  H33.321 OCT, Retina - OU - Both Eyes    Color Fundus Photography Optos - OU - Both Eyes  2. Retinal  telangiectasia of right eye  H35.071 OCT, Retina -  OU - Both Eyes  3. Cystoid macular edema of right eye  H35.351 OCT, Retina - OU - Both Eyes  4. Pseudophakia of both eyes  Z96.1     1.Myopic contours are stable over time, no active CME from his [employer].  Patient continues on CPAP with great success and no further symptomatology.  2.  3.  Ophthalmic Meds Ordered this visit:  No orders of the defined types were placed in this encounter.      No follow-ups on file.  There are no Patient Instructions on file for this visit.   Explained the diagnoses, plan, and follow up with the patient and they expressed understanding.  Patient expressed understanding of the importance of proper follow up care.   Jeffery [name] M.D. Diseases & Surgery of the Retina and Vitreous Retina & Diabetic [place] 08/26/19     Abbreviations: M myopia (nearsighted); A astigmatism; H hyperopia (farsighted); P presbyopia; Mrx spectacle prescription;  CTL contact lenses; OD right eye; OS left eye; OU both eyes  XT exotropia; ET esotropia; PEK punctate epithelial keratitis; PEE punctate epithelial erosions; DES dry eye syndrome; MGD meibomian gland dysfunction; ATs artificial tears; PFAT's preservative free artificial tears; [place] nuclear sclerotic cataract; PSC posterior subcapsular cataract; ERM epi-retinal membrane; PVD posterior vitreous detachment; RD retinal detachment; DM diabetes mellitus; DR diabetic retinopathy; NPDR non-proliferative diabetic retinopathy; PDR proliferative diabetic retinopathy; CSME clinically significant macular edema; DME diabetic macular edema; dbh dot blot hemorrhages; CWS cotton wool spot; POAG primary open angle glaucoma; C/D cup-to-disc ratio; HVF humphrey visual field; GVF goldmann visual field; OCT optical coherence tomography; IOP intraocular pressure; BRVO Branch retinal vein occlusion; CRVO central retinal vein occlusion; CRAO central retinal artery occlusion; BRAO branch  retinal artery occlusion; RT retinal tear; SB scleral buckle; PPV pars plana vitrectomy; VH Vitreous hemorrhage; PRP panretinal laser photocoagulation; IVK intravitreal kenalog; VMT vitreomacular traction; MH Macular hole;  NVD neovascularization of the disc; NVE neovascularization elsewhere; AREDS age related eye disease study; ARMD age related macular degeneration; POAG primary open angle glaucoma; EBMD epithelial/anterior basement membrane dystrophy; ACIOL anterior chamber intraocular lens; IOL intraocular lens; PCIOL posterior chamber intraocular lens; Phaco/IOL phacoemulsification with intraocular lens placement; [place] photorefractive keratectomy; LASIK laser assisted in situ keratomileusis; HTN hypertension; DM diabetes mellitus; COPD chronic obstructive pulmonary disease

## 2019-08-28 ENCOUNTER — Telehealth: Payer: Self-pay | Admitting: Neurology

## 2019-08-28 ENCOUNTER — Encounter: Payer: Self-pay | Admitting: Neurology

## 2019-08-28 NOTE — Telephone Encounter (Signed)
Pt called stating he was returning a call from [facility].  Pt states he would like a call from Casey,RN to discuss a referral he discussed with Dr Brett Fairy

## 2019-08-29 ENCOUNTER — Ambulatory Visit: Payer: Self-pay | Admitting: Neurology

## 2019-09-02 ENCOUNTER — Telehealth: Payer: Self-pay

## 2019-09-02 NOTE — Telephone Encounter (Signed)
LVM for pt to call me back to schedule sleep study  

## 2019-09-06 ENCOUNTER — Other Ambulatory Visit: Payer: Self-pay

## 2019-09-06 ENCOUNTER — Other Ambulatory Visit (HOSPITAL_COMMUNITY): Payer: Self-pay | Admitting: Internal Medicine

## 2019-09-06 ENCOUNTER — Ambulatory Visit (HOSPITAL_COMMUNITY)
Admission: RE | Admit: 2019-09-06 | Discharge: 2019-09-06 | Disposition: A | Discharge status: Home / Self Care | Payer: Medicare Other | Source: Ambulatory Visit | Attending: Vascular Surgery | Admitting: Vascular Surgery

## 2019-09-06 DIAGNOSIS — I739 Peripheral vascular disease, unspecified: Secondary | ICD-10-CM

## 2019-09-11 ENCOUNTER — Other Ambulatory Visit (HOSPITAL_COMMUNITY): Payer: Self-pay | Admitting: Neurosurgery

## 2019-09-12 ENCOUNTER — Ambulatory Visit (INDEPENDENT_AMBULATORY_CARE_PROVIDER_SITE_OTHER): Payer: Medicare Other | Admitting: Neurology

## 2019-09-12 ENCOUNTER — Other Ambulatory Visit (HOSPITAL_COMMUNITY): Payer: Self-pay | Admitting: Neurosurgery

## 2019-09-12 ENCOUNTER — Other Ambulatory Visit: Payer: Self-pay

## 2019-09-12 ENCOUNTER — Encounter: Payer: Self-pay | Admitting: Rheumatology

## 2019-09-12 DIAGNOSIS — Z950 Presence of cardiac pacemaker: Secondary | ICD-10-CM

## 2019-09-12 DIAGNOSIS — G4731 Primary central sleep apnea: Secondary | ICD-10-CM | POA: Diagnosis not present

## 2019-09-12 DIAGNOSIS — G629 Polyneuropathy, unspecified: Secondary | ICD-10-CM

## 2019-09-12 DIAGNOSIS — I4510 Unspecified right bundle-branch block: Secondary | ICD-10-CM

## 2019-09-12 DIAGNOSIS — G9519 Other vascular myelopathies: Secondary | ICD-10-CM

## 2019-09-12 DIAGNOSIS — G4739 Other sleep apnea: Secondary | ICD-10-CM

## 2019-09-17 ENCOUNTER — Other Ambulatory Visit: Payer: Self-pay | Admitting: Rheumatology

## 2019-09-17 ENCOUNTER — Ambulatory Visit (INDEPENDENT_AMBULATORY_CARE_PROVIDER_SITE_OTHER): Payer: Medicare Other | Admitting: *Deleted

## 2019-09-17 ENCOUNTER — Encounter: Payer: Self-pay | Admitting: Neurology

## 2019-09-17 DIAGNOSIS — I442 Atrioventricular block, complete: Secondary | ICD-10-CM

## 2019-09-17 LAB — CUP PACEART REMOTE DEVICE CHECK
Battery Remaining Longevity: 101 mo
Battery Voltage: 3 V
Brady Statistic AP VP Percent: 5.02 %
Brady Statistic AP VS Percent: 0 %
Brady Statistic AS VP Percent: 94.88 %
Brady Statistic AS VS Percent: 0.1 %
Brady Statistic RA Percent Paced: 5.01 %
Brady Statistic RV Percent Paced: 99.9 %
Date Time Interrogation Session: 20210608103907
Implantable Lead Implant Date: 20180827
Implantable Lead Implant Date: 20180827
Implantable Lead Location: 753859
Implantable Lead Location: 753860
Implantable Lead Model: 4076
Implantable Lead Model: 4076
Implantable Pulse Generator Implant Date: 20180827
Lead Channel Impedance Value: 342 Ohm
Lead Channel Impedance Value: 380 Ohm
Lead Channel Impedance Value: 418 Ohm
Lead Channel Impedance Value: 437 Ohm
Lead Channel Pacing Threshold Amplitude: 0.75 V
Lead Channel Pacing Threshold Amplitude: 0.875 V
Lead Channel Pacing Threshold Pulse Width: 0.4 ms
Lead Channel Pacing Threshold Pulse Width: 0.4 ms
Lead Channel Sensing Intrinsic Amplitude: 5 mV
Lead Channel Sensing Intrinsic Amplitude: 5 mV
Lead Channel Sensing Intrinsic Amplitude: 6.625 mV
Lead Channel Sensing Intrinsic Amplitude: 6.625 mV
Lead Channel Setting Pacing Amplitude: 1.5 V
Lead Channel Setting Pacing Amplitude: 2.5 V
Lead Channel Setting Pacing Pulse Width: 0.4 ms
Lead Channel Setting Sensing Sensitivity: 4 mV

## 2019-09-17 NOTE — Telephone Encounter (Signed)
Last Visit: 08/14/2019 °Next Visit: 02/11/2020 ° °Okay to refill per Dr. Deveshwar  °

## 2019-09-18 NOTE — Progress Notes (Signed)
Remote pacemaker transmission.   

## 2019-09-20 DIAGNOSIS — G4739 Other sleep apnea: Secondary | ICD-10-CM | POA: Insufficient documentation

## 2019-09-20 DIAGNOSIS — Z950 Presence of cardiac pacemaker: Secondary | ICD-10-CM | POA: Insufficient documentation

## 2019-09-20 NOTE — Procedures (Signed)
PATIENT'S NAME:  Jeffery [name]., [name] DOB:      12-24-1934      MR#:    [id number]     DATE OF RECORDING: 09/12/2019 Rudene Christians REFERRING M.D.:  Shon Baton MD Study Performed:   ASV Titration HISTORY:   Jeffery [name] is a well established 85 year-old patient who returns after BiPAP titration did not control complex apnea. He has thus far failed CPAP and BiPAP and is now returning for ASV.  I have the pleasure of meeting with Jeffery [name] today on 26 Aug 2019 patient is an 85 year old Caucasian gentleman who has been followed for CPAP therapy as well as neuropathy.  Previous sleep studies had found primary central sleep apnea which responded to CPAP therapy but was in the last several months they have been rising levels of residual AHI currently to 8.45 using CPAP as an AutoSet.  He was invited to a BiPAP titration.  We used a nonmagnetic clasp near the right ear a full face medium and first began with a CPAP titration from 5-14 cmH2O it did not reduce the AHI significantly changed to BiPAP was made beginning at 14/10 cmH2O and immediately reducing the residual apnea to 1.5/h the best result was seen on 15/11 centimeters water pressure it seems that for this complex sleep apnea and for the periodic limb movements BiPAP therapy controlled both only non-supine sleep position. Due to this finding, we decided to give ASV a chance. He would consider rather going for ASV outright. He needed to return for this ASV titration.   The patient endorsed the Epworth Sleepiness Scale at 16 points.   The patient's weight 172 pounds with a height of 74 (inches), resulting in a BMI of 22.1 kg/m2. The patient's neck circumference measured 15 inches.  CURRENT MEDICATIONS: Tylenol, Calcium, Vitamin D, CoQ10, Welchol, D3-50, Flector, Voltaren, Folvite, Plaquenil, Glucophage, Rasuvo, Lovaza, Vitamin E    PROCEDURE:  This is a multichannel digital polysomnogram utilizing the SomnoStar 11.2 system.  Electrodes and sensors were applied and  monitored per AASM Specifications.   EEG, EOG, Chin and Limb EMG, were sampled at 200 Hz.  ECG, Snore and Nasal Pressure, Thermal Airflow, Respiratory Effort, CPAP Flow and Pressure, Oximetry was sampled at 50 Hz. Digital video and audio were recorded.      ASV was initiated at 15/6 /4 cmH20 with heated humidity per AASM standards and EEP and pressure support were gradually advanced. The by far best tolerated and most efficacious pressure was ASV at 15/10 over 4 cm water under which pressure the patient slept for 183 minutes and reached an AHI of 1.0/h when non-supine.  because of hypopneas, apneas and desaturations.   An ASV pressure of 15 cm max pressure support, EPAP 10 cm and minimum pressure support of 4 cm water., there was a reduction of the AHI to 0.0 with improvement of sleep apnea in non-supine.  Lights Out was at 21:40 and Lights On at 05:00. Total recording time (TRT) was 440 minutes, with a total sleep time (TST) of 321.5 minutes. The patient's sleep latency was 32.5 minutes.  REM latency was 138.5 minutes.  The sleep efficiency was 73.1 %.    SLEEP ARCHITECTURE: WASO (Wake after sleep onset) was 110 minutes.  There were 61 minutes in Stage N1, 196 minutes Stage N2, 26 minutes Stage N3 and 38.5 minutes in Stage REM.  The percentage of Stage N1 was 19.0%, Stage N2 was 61.0%, Stage N3 was 8.1% and Stage R (REM sleep) was  12.9 %. The sleep architecture was notable for fragmentation while in supine sleep, and restful REM and NREM sleep when in non-supine.   RESPIRATORY ANALYSIS:  There was a total of 83 respiratory events: 0 obstructive apneas, 0 central apneas and 0 mixed apneas with a total of 0 apneas and an apnea index (AI) of 0 /hour. There were 83 hypopneas with a hypopnea index of 15.5/hour.  The patient also had 4 respiratory event related arousals (RERAs).     The total APNEA/HYPOPNEA INDEX  (AHI) was 15.5 /hour and the total RESPIRATORY DISTURBANCE INDEX was 16.2 /hour.   0 events  occurred in REM sleep and 83 events in NREM. The REM AHI was 0 /hour versus a non-REM AHI of 17.6 /hour.   The patient spent 131 minutes of total sleep time in the supine position and 201 minutes in non-supine.  The supine AHI was 38.0/h, versus a non-supine AHI of 0.0.  OXYGEN SATURATION & C02:  The baseline 02 saturation was 96%, with the lowest being 83%. Time spent below 89% saturation equaled 17 minutes. The arousals were noted as: 58 were spontaneous, 0 were associated with PLMs, and 45 were associated with respiratory events. The patient had a total of 22 Periodic Limb Movements. The Periodic Limb Movement (PLM) index was 4.1 and the PLM Arousal index was 0 /hour. Audio and video analysis did not show any abnormal or unusual movements, behaviors, phonations or vocalizations.  Nocturia times one. EKG in paced rhythm.  Post-study, the patient indicated that sleep was much improved and more restorative than after any previous in lab study.   DIAGNOSIS 1. Complex Central Sleep Apnea, unresponsive to CPAP or BiPAP, was alleviated to an AHI of 0.0/h under ASV therapy. The AHI was again only controlled in non-supine sleep. All stages of sleep were reached. There was a total of 3.3 hours of non- supine sleep recorded.  2. Sleep Related Hypoxemia improved greatly.  3. EKG in paced rhythm.  PLANS/RECOMMENDATIONS: The patient used the FFM-model Vitera large as interface. He benefited greatly from ASV at 15 cm maximum pressure support /10 cm EPAP /over 4 cm water minimum pressure support.   DISCUSSION: A follow up appointment will be scheduled in the Sleep Clinic at [facility].   Please call [phone] with any questions.    I certify that I have reviewed the entire raw data recording prior to the issuance of this report in accordance with the Standards of Accreditation of the American Academy of Sleep Medicine (AASM) Larey Seat, M.D. Diplomat, Tax adviser of Psychiatry  and Neurology  Diplomat, Tax adviser of Sleep Medicine Market researcher, [employer] [facility] at [employer]

## 2019-09-20 NOTE — Addendum Note (Signed)
Addended by: Larey Seat on: 09/20/2019 05:51 PM   Modules accepted: Orders

## 2019-09-20 NOTE — Progress Notes (Signed)
DIAGNOSIS 1. Complex Central Sleep Apnea, unresponsive to CPAP or BiPAP, was alleviated to an AHI of 0.0/h under ASV therapy. The AHI was again only controlled in non-supine sleep. All stages of sleep were reached. There was a total of 3.3 hours of non- supine sleep recorded.  2. Sleep Related Hypoxemia improved greatly.  3. EKG in paced rhythm.  PLANS/RECOMMENDATIONS: The patient used the FFM-model Vitera large as interface. He benefited greatly from ASV at 15 cm maximum pressure support /10 cm EPAP /over 4 cm water minimum pressure support.

## 2019-09-20 NOTE — Telephone Encounter (Signed)
I am getting his order out, would like to review a download on the new machine after 30 days of use and can make that a virtual visit.

## 2019-09-26 ENCOUNTER — Telehealth: Payer: Self-pay | Admitting: Neurology

## 2019-09-26 DIAGNOSIS — G4730 Sleep apnea, unspecified: Secondary | ICD-10-CM

## 2019-09-26 DIAGNOSIS — I4510 Unspecified right bundle-branch block: Secondary | ICD-10-CM

## 2019-09-26 DIAGNOSIS — Z950 Presence of cardiac pacemaker: Secondary | ICD-10-CM

## 2019-09-26 DIAGNOSIS — Z9989 Dependence on other enabling machines and devices: Secondary | ICD-10-CM

## 2019-09-26 DIAGNOSIS — G4731 Primary central sleep apnea: Secondary | ICD-10-CM

## 2019-09-26 NOTE — Telephone Encounter (Signed)
I spoke with Jeffery [name] by phone today and explained the 2 options we currently have in regards of treating his apnea sufficiently.  The patient has traditionally presented for central sleep apnea but in his CPAP retitration which changed him to BiPAP he presented without any central events.  This has made it difficult to get approval for an ASV machine covered under Medicare.  We discussed the outside purchase of an ASV machine versus going with a BiPAP setting and avoiding supine sleep position.  BiPAP is also covered by Medicare which is a significant difference in cost.  We agreed that we will use BiPAP BiPAP will be set to a 14/10 centimeters water pressure I would like to allow a window of 2 cm upward and we will make efforts to change the behavior of sleeping supine either by the tennis ball methods or by placing an object under the sheets that will make it harder to remain in supine position.  The above named settings allowed for the patient to sleep 161 minutes combined with a residual AHI of 1.4 and 1.5 and covered even REM sleep.  Jeffery [name] agreed to the step, and I will place the order for right now.

## 2019-09-27 ENCOUNTER — Other Ambulatory Visit: Payer: Self-pay | Admitting: Rheumatology

## 2019-09-27 DIAGNOSIS — Z79899 Other long term (current) drug therapy: Secondary | ICD-10-CM

## 2019-09-27 NOTE — Telephone Encounter (Signed)
Last Visit: 08/14/2019 °Next Visit: 02/11/2020 ° °Okay to refill per Dr. Deveshwar  °

## 2019-09-30 ENCOUNTER — Other Ambulatory Visit: Payer: Self-pay | Admitting: Rheumatology

## 2019-09-30 DIAGNOSIS — Z79899 Other long term (current) drug therapy: Secondary | ICD-10-CM

## 2019-09-30 NOTE — Telephone Encounter (Signed)
Last Visit: 08/14/2019 °Next Visit: 02/11/2020 ° °Okay to refill per Dr. Deveshwar  °

## 2019-10-02 ENCOUNTER — Encounter: Payer: Medicare Other | Admitting: Internal Medicine

## 2019-10-07 ENCOUNTER — Other Ambulatory Visit: Payer: Self-pay

## 2019-10-07 ENCOUNTER — Ambulatory Visit (HOSPITAL_COMMUNITY)
Admission: RE | Admit: 2019-10-07 | Discharge: 2019-10-07 | Disposition: A | Discharge status: Home / Self Care | Payer: Medicare Other | Source: Ambulatory Visit | Attending: Neurosurgery | Admitting: Neurosurgery

## 2019-10-07 ENCOUNTER — Ambulatory Visit (HOSPITAL_COMMUNITY)
Admission: RE | Admit: 2019-10-07 | Discharge: 2019-10-07 | Disposition: A | Discharge status: Home / Self Care | Payer: Medicare Other | Source: Ambulatory Visit | Attending: Physician Assistant | Admitting: Physician Assistant

## 2019-10-07 DIAGNOSIS — Z950 Presence of cardiac pacemaker: Secondary | ICD-10-CM | POA: Diagnosis present

## 2019-10-07 DIAGNOSIS — G9519 Other vascular myelopathies: Secondary | ICD-10-CM

## 2019-10-07 DIAGNOSIS — M48062 Spinal stenosis, lumbar region with neurogenic claudication: Secondary | ICD-10-CM | POA: Insufficient documentation

## 2019-10-08 ENCOUNTER — Other Ambulatory Visit: Payer: Self-pay | Admitting: Rheumatology

## 2019-10-08 DIAGNOSIS — Z79899 Other long term (current) drug therapy: Secondary | ICD-10-CM

## 2019-10-10 ENCOUNTER — Other Ambulatory Visit: Payer: Self-pay | Admitting: Rheumatology

## 2019-10-10 DIAGNOSIS — Z79899 Other long term (current) drug therapy: Secondary | ICD-10-CM

## 2019-10-10 NOTE — Telephone Encounter (Signed)
Last Visit: 08/14/2019 °Next Visit: 02/11/2020 ° °Okay to refill per Dr. Deveshwar  °

## 2019-10-17 ENCOUNTER — Other Ambulatory Visit: Payer: Self-pay | Admitting: Rheumatology

## 2019-10-17 ENCOUNTER — Other Ambulatory Visit: Payer: Self-pay | Admitting: *Deleted

## 2019-10-17 DIAGNOSIS — Z79899 Other long term (current) drug therapy: Secondary | ICD-10-CM

## 2019-10-17 LAB — COMPLETE METABOLIC PANEL WITH GFR
AG Ratio: 1.7 (calc) (ref 1.0–2.5)
ALT: 16 U/L (ref 9–46)
AST: 6 U/L — ABNORMAL LOW (ref 10–35)
Albumin: 4.3 g/dL (ref 3.6–5.1)
Alkaline phosphatase (APISO): 73 U/L (ref 35–144)
BUN: 19 mg/dL (ref 7–25)
CO2: 33 mmol/L — ABNORMAL HIGH (ref 20–32)
Calcium: 9.9 mg/dL (ref 8.6–10.3)
Chloride: 100 mmol/L (ref 98–110)
Creat: 0.75 mg/dL (ref 0.70–1.11)
GFR, Est African American: 99 mL/min/{1.73_m2} (ref >=60)
GFR, Est Non African American: 85 mL/min/{1.73_m2} (ref >=60)
Globulin: 2.6 g/dL (calc) (ref 1.9–3.7)
Glucose, Bld: 145 mg/dL — ABNORMAL HIGH (ref 65–99)
Potassium: 4.7 mmol/L (ref 3.5–5.3)
Sodium: 139 mmol/L (ref 135–146)
Total Bilirubin: 0.6 mg/dL (ref 0.2–1.2)
Total Protein: 6.9 g/dL (ref 6.1–8.1)

## 2019-10-17 LAB — CBC WITH DIFFERENTIAL/PLATELET
Absolute Monocytes: 520 cells/uL (ref 200–950)
Basophils Absolute: 10 cells/uL (ref 0–200)
Basophils Relative: 0.2 %
Eosinophils Absolute: 182 cells/uL (ref 15–500)
Eosinophils Relative: 3.5 %
HCT: 44.2 % (ref 38.5–50.0)
Hemoglobin: 14.4 g/dL (ref 13.2–17.1)
Lymphs Abs: 1867 cells/uL (ref 850–3900)
MCH: 30.8 pg (ref 27.0–33.0)
MCHC: 32.6 g/dL (ref 32.0–36.0)
MCV: 94.4 fL (ref 80.0–100.0)
MPV: 9.5 fL (ref 7.5–12.5)
Monocytes Relative: 10 %
Neutro Abs: 2621 cells/uL (ref 1500–7800)
Neutrophils Relative %: 50.4 %
Platelets: 261 10*3/uL (ref 140–400)
RBC: 4.68 10*6/uL (ref 4.20–5.80)
RDW: 12.1 % (ref 11.0–15.0)
Total Lymphocyte: 35.9 %
WBC: 5.2 10*3/uL (ref 3.8–10.8)

## 2019-10-17 NOTE — Telephone Encounter (Signed)
Last Visit: 08/14/2019 Next Visit: 02/11/2020 Labs: 07/22/2019 Glucose is elevated. Rest the labs are stable  Current Dose per office note on 08/14/2019: MTX 0.6 ml sq once weekly  Patient advised he is due to update labs. Patient will update labs this week.   Okay to refill MTX?

## 2019-10-28 ENCOUNTER — Encounter: Payer: Self-pay | Admitting: Rheumatology

## 2019-10-28 NOTE — Telephone Encounter (Signed)
Please advise 

## 2019-10-29 ENCOUNTER — Other Ambulatory Visit: Payer: Self-pay | Admitting: Rheumatology

## 2019-10-29 NOTE — Telephone Encounter (Signed)
Last Visit: 08/14/2019 °Next Visit: 02/11/2020 ° °Okay to refill per Dr. Deveshwar  °

## 2019-11-22 ENCOUNTER — Other Ambulatory Visit: Payer: Self-pay | Admitting: *Deleted

## 2019-11-22 DIAGNOSIS — M0609 Rheumatoid arthritis without rheumatoid factor, multiple sites: Secondary | ICD-10-CM

## 2019-11-22 MED ORDER — HYDROXYCHLOROQUINE SULFATE 200 MG PO TABS: 200.0000 mg | ORAL_TABLET | Freq: Two times a day (BID) | ORAL | 0 refills | Status: DC

## 2019-11-22 NOTE — Addendum Note (Signed)
Addended by: Carole Binning on: 11/22/2019 02:29 PM   Modules accepted: Orders

## 2019-11-22 NOTE — Telephone Encounter (Signed)
Refill request received via fax   Last Visit: 08/14/2019 Next Visit: 02/11/2020 Labs: 10/17/2019 CBC WNL. Glucose is elevated-145. CO2 is borderline elevated. AST is borderline low but stable. Rest of CMP WNL.  PLQ Eye Exam: 09/12/2019 WNL   Current Dose per office note on 08/14/2019: PLQ 200 mg BID   Okay to refill PLQ?

## 2019-12-17 ENCOUNTER — Ambulatory Visit (INDEPENDENT_AMBULATORY_CARE_PROVIDER_SITE_OTHER): Payer: Medicare Other | Admitting: *Deleted

## 2019-12-17 DIAGNOSIS — I442 Atrioventricular block, complete: Secondary | ICD-10-CM

## 2019-12-17 LAB — CUP PACEART REMOTE DEVICE CHECK
Battery Remaining Longevity: 99 mo
Battery Voltage: 3 V
Brady Statistic AP VP Percent: 1.44 %
Brady Statistic AP VS Percent: 0 %
Brady Statistic AS VP Percent: 98.56 %
Brady Statistic AS VS Percent: 0.01 %
Brady Statistic RA Percent Paced: 1.43 %
Brady Statistic RV Percent Paced: 99.99 %
Date Time Interrogation Session: 20210906195533
Implantable Lead Implant Date: 20180827
Implantable Lead Implant Date: 20180827
Implantable Lead Location: 753859
Implantable Lead Location: 753860
Implantable Lead Model: 4076
Implantable Lead Model: 4076
Implantable Pulse Generator Implant Date: 20180827
Lead Channel Impedance Value: 342 Ohm
Lead Channel Impedance Value: 380 Ohm
Lead Channel Impedance Value: 399 Ohm
Lead Channel Impedance Value: 456 Ohm
Lead Channel Pacing Threshold Amplitude: 0.625 V
Lead Channel Pacing Threshold Amplitude: 0.875 V
Lead Channel Pacing Threshold Pulse Width: 0.4 ms
Lead Channel Pacing Threshold Pulse Width: 0.4 ms
Lead Channel Sensing Intrinsic Amplitude: 4.875 mV
Lead Channel Sensing Intrinsic Amplitude: 4.875 mV
Lead Channel Sensing Intrinsic Amplitude: 6.625 mV
Lead Channel Sensing Intrinsic Amplitude: 6.625 mV
Lead Channel Setting Pacing Amplitude: 1.5 V
Lead Channel Setting Pacing Amplitude: 2.5 V
Lead Channel Setting Pacing Pulse Width: 0.4 ms
Lead Channel Setting Sensing Sensitivity: 4 mV

## 2019-12-19 NOTE — Progress Notes (Signed)
Remote pacemaker transmission.   

## 2019-12-25 ENCOUNTER — Other Ambulatory Visit: Payer: Self-pay | Admitting: Rheumatology

## 2019-12-25 NOTE — Telephone Encounter (Signed)
Last Visit: 08/14/2019 Next Visit: 02/11/2020  Okay to refill per Dr. Estanislado Pandy

## 2020-01-08 ENCOUNTER — Telehealth: Payer: Self-pay

## 2020-01-08 ENCOUNTER — Other Ambulatory Visit: Payer: Self-pay

## 2020-01-08 DIAGNOSIS — M810 Age-related osteoporosis without current pathological fracture: Secondary | ICD-10-CM

## 2020-01-08 DIAGNOSIS — M8589 Other specified disorders of bone density and structure, multiple sites: Secondary | ICD-10-CM

## 2020-01-08 DIAGNOSIS — Z79899 Other long term (current) drug therapy: Secondary | ICD-10-CM

## 2020-01-08 DIAGNOSIS — Z8639 Personal history of other endocrine, nutritional and metabolic disease: Secondary | ICD-10-CM

## 2020-01-08 NOTE — Telephone Encounter (Signed)
Order placed

## 2020-01-08 NOTE — Telephone Encounter (Signed)
Ok to add vitamin D and vitamin b12 to his lab orders

## 2020-01-08 NOTE — Telephone Encounter (Signed)
Patient called stating he will be in the office this afternoon for labwork and would like Dr. Estanislado Pandy to add D3 and B12 to his order.  Patient states he has been taking metFORMIN for a long time and wants to check his B12 level.

## 2020-01-09 LAB — VITAMIN D 25 HYDROXY (VIT D DEFICIENCY, FRACTURES): Vit D, 25-Hydroxy: 46 ng/mL (ref 30–100)

## 2020-01-09 LAB — COMPLETE METABOLIC PANEL WITH GFR
AG Ratio: 1.8 (calc) (ref 1.0–2.5)
ALT: 14 U/L (ref 9–46)
AST: 10 U/L (ref 10–35)
Albumin: 4.5 g/dL (ref 3.6–5.1)
Alkaline phosphatase (APISO): 66 U/L (ref 35–144)
BUN/Creatinine Ratio: 24 (calc) — ABNORMAL HIGH (ref 6–22)
BUN: 15 mg/dL (ref 7–25)
CO2: 35 mmol/L — ABNORMAL HIGH (ref 20–32)
Calcium: 9.9 mg/dL (ref 8.6–10.3)
Chloride: 100 mmol/L (ref 98–110)
Creat: 0.63 mg/dL — ABNORMAL LOW (ref 0.70–1.11)
GFR, Est African American: 106 mL/min/{1.73_m2} (ref >=60)
GFR, Est Non African American: 91 mL/min/{1.73_m2} (ref >=60)
Globulin: 2.5 g/dL (calc) (ref 1.9–3.7)
Glucose, Bld: 130 mg/dL — ABNORMAL HIGH (ref 65–99)
Potassium: 5.1 mmol/L (ref 3.5–5.3)
Sodium: 140 mmol/L (ref 135–146)
Total Bilirubin: 0.6 mg/dL (ref 0.2–1.2)
Total Protein: 7 g/dL (ref 6.1–8.1)

## 2020-01-09 LAB — CBC WITH DIFFERENTIAL/PLATELET
Absolute Monocytes: 451 cells/uL (ref 200–950)
Basophils Absolute: 9 cells/uL (ref 0–200)
Basophils Relative: 0.2 %
Eosinophils Absolute: 99 cells/uL (ref 15–500)
Eosinophils Relative: 2.1 %
HCT: 45.8 % (ref 38.5–50.0)
Hemoglobin: 15.3 g/dL (ref 13.2–17.1)
Lymphs Abs: 1631 cells/uL (ref 850–3900)
MCH: 31.4 pg (ref 27.0–33.0)
MCHC: 33.4 g/dL (ref 32.0–36.0)
MCV: 94 fL (ref 80.0–100.0)
MPV: 9.3 fL (ref 7.5–12.5)
Monocytes Relative: 9.6 %
Neutro Abs: 2510 cells/uL (ref 1500–7800)
Neutrophils Relative %: 53.4 %
Platelets: 286 10*3/uL (ref 140–400)
RBC: 4.87 10*6/uL (ref 4.20–5.80)
RDW: 12.5 % (ref 11.0–15.0)
Total Lymphocyte: 34.7 %
WBC: 4.7 10*3/uL (ref 3.8–10.8)

## 2020-01-09 LAB — VITAMIN B12: Vitamin B-12: 447 pg/mL (ref 200–1100)

## 2020-01-09 NOTE — Progress Notes (Signed)
All the labs are within normal limits.

## 2020-01-28 NOTE — Progress Notes (Signed)
Office Visit Note  Patient: Jeffery [name]             Date of Birth: 04/15/1934           MRN: [id number]             PCP: Shon Baton, MD Referring: Shon Baton, MD Visit Date: 02/11/2020 Occupation: @GUAROCC @  Subjective:  Medication monitoring   History of Present Illness: Jeffery [name] is a 85 y.o. male with history of rheumatoid arthritis, polymyalgia rheumatica and osteoarthritis.  He has been doing well on combination of methotrexate and Plaquenil.  He states he saw Dr. Vertell Limber for lower back pain and had a cortisone injection.  He is also going to physical therapy which has been helpful.  He has been doing exercises on a regular basis.  Activities of Daily Living:  Patient reports morning stiffness for 1 hour.   Patient Denies nocturnal pain.  Difficulty dressing/grooming: Denies Difficulty climbing stairs: Denies Difficulty getting out of chair: Denies Difficulty using hands for taps, buttons, cutlery, and/or writing: Denies  Review of Systems  Constitutional: Positive for fatigue.  HENT: Negative for mouth dryness.   Eyes: Negative for dryness.  Respiratory: Negative for shortness of breath.   Cardiovascular: Negative for swelling in legs/feet.  Gastrointestinal: Negative for constipation.  Endocrine: Negative for excessive thirst.  Genitourinary: Negative for difficulty urinating.  Musculoskeletal: Positive for arthralgias, joint pain, muscle weakness and morning stiffness.  Skin: Negative for rash.  Allergic/Immunologic: Negative for susceptible to infections.  Neurological: Negative for weakness.  Hematological: Negative for bruising/bleeding tendency.  Psychiatric/Behavioral: Positive for sleep disturbance.    PMFS History:  Patient Active Problem List   Diagnosis Date Noted  . Pacemaker ECG pattern 09/20/2019  . Treatment-emergent central sleep apnea 09/20/2019  . Round hole of right eye 08/26/2019  . Retinal telangiectasia of right eye  08/26/2019  . Cystoid macular edema of right eye 08/26/2019  . Pseudophakia of both eyes 08/26/2019  . Obstructive sleep apnea of adult 08/26/2019  . 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) 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 08/24/2019  . Educated about COVID-19 virus infection 10/08/2018  . Aortic root dilatation ([place]) 10/08/2018  . Mitral annular calcification 10/08/2018  . Neuropathy 08/21/2017  . Hydroxychloroquine causing adverse effect in therapeutic use 08/21/2017  . Symptomatic bradycardia 02/23/2017  . Status cardiac pacemaker 02/23/2017  . CSA (central sleep apnea) 02/23/2017  . Heart block AV complete ([place]) 01/18/2017  . Vitamin D deficiency 02/08/2016  . Trigger finger, left middle finger 02/08/2016  . High risk medication use 02/08/2016  . Bursitis of right shoulder 02/08/2016  . DDD (degenerative disc disease), lumbar 02/08/2016  . DDD (degenerative disc disease), thoracic 02/08/2016  . Rheumatoid arthritis of multiple sites without rheumatoid factor ([place]) 11/28/2015    Class: Chronic  . Other secondary osteoarthritis of both hands 11/28/2015    Class: Chronic  . PMR (polymyalgia rheumatica) (HCC) 11/28/2015    Class: Chronic  . Osteoporosis 11/28/2015    Class: Chronic  . Prostate cancer ([place]) 11/28/2015    Class: History of  . Duodenal ulcer 11/28/2015    Class: Chronic  . High cholesterol 11/28/2015  . Dizziness 07/16/2015  . Incomplete RBBB 03/31/2015  . Sleep apnea, primary central 03/03/2015  . Polyneuropathy ([place]) 01/24/2013  . Right inguinal hernia 07/05/2012    Past Medical History:  Diagnosis Date  . Arthritis   . BCC (basal cell carcinoma)   . BPH (benign prostatic  hypertrophy) 2008  . Cancer [facility]) 2008   Prostate  . Cervical stenosis of spine    per patient   . Complete heart block ([place])   . Complex sleep apnea syndrome    does not use CPAP  . Diabetes mellitus without complication ([place])   . Diverticulosis   . ED  (erectile dysfunction)   . GERD (gastroesophageal reflux disease)   . Hemorrhoids   . Hernia, inguinal   . Hyperlipidemia   . IFG (impaired fasting glucose)   . Osteopenia   . Peptic ulcer disease with hemorrhage 1993  . Shingles   . Vitamin D deficiency     Family History  Problem Relation Age of Onset  . Heart failure Mother        Died age 73.  Apparently had valve surgery.   . Congenital heart disease Brother    Past Surgical History:  Procedure Laterality Date  . BACK SURGERY N/A 1984  . CATARACT EXTRACTION, BILATERAL    . HERNIA REPAIR    . LAMINECTOMY  1984   L 5  . PACEMAKER IMPLANT  12/05/2016   MDT Azure XT DR MRI conditional PPM implanted for CHB by Dr Vista Deck in [place]  . PACEMAKER INSERTION  12/05/2016  . PROSTATE SURGERY  12/2006   Robotic  . TONSILLECTOMY  85 years old  . TRIGGER FINGER RELEASE  03/01/2012   Procedure: MINOR RELEASE TRIGGER FINGER/A-1 PULLEY;  Surgeon: Cammie Sickle., MD;  Location: [facility];  Service: Orthopedics;  Laterality: Left;  long finger  . TRIGGER FINGER RELEASE Left 02/08/2018   Procedure: RELEASE TRIGGER FINGER/A-1 PULLEY LEFT RING FINGER;  Surgeon: Daryll Brod, MD;  Location: [facility];  Service: Orthopedics;  Laterality: Left;   Social History   Social History Narrative   Right handed, Retired .Married, 2 kids. Caffeine none.   3 Step    Immunization History  Administered Date(s) Administered  . Influenza, High Dose Seasonal PF 03/03/2017, 01/26/2018  . Moderna SARS-COVID-2 Vaccination 04/13/2019, 05/14/2019, 12/12/2019     Objective: Vital Signs: BP (!) 144/70 (BP Location: Left Arm, Patient Position: Sitting, Cuff Size: Normal)   Pulse 85   Resp 16   Ht 6\' 1"  (1.854 m)   Wt 171 lb (77.6 kg)   BMI 22.56 kg/m    Physical Exam Vitals and nursing note reviewed.  Constitutional:      Appearance: He is well-developed.  HENT:     Head: Normocephalic and atraumatic.  Eyes:      Conjunctiva/sclera: Conjunctivae normal.     Pupils: Pupils are equal, round, and reactive to light.  Cardiovascular:     Rate and Rhythm: Normal rate and regular rhythm.     Heart sounds: Normal heart sounds.  Pulmonary:     Effort: Pulmonary effort is normal.     Breath sounds: Normal breath sounds.  Abdominal:     General: Bowel sounds are normal.     Palpations: Abdomen is soft.  Musculoskeletal:     Cervical back: Normal range of motion and neck supple.  Skin:    General: Skin is warm and dry.     Capillary Refill: Capillary refill takes less than 2 seconds.  Neurological:     Mental Status: He is alert and oriented to person, place, and time.  Psychiatric:        Behavior: Behavior normal.      Musculoskeletal Exam: He has limited range of motion of cervical thoracic and  lumbar spine without much discomfort.  Shoulder joints, elbow joints, wrist joints with good range of motion.  He has DIP and PIP thickening with no synovitis in MCPs or PIPs.  Hip joints and knee joints with good range of motion.  He had no tenderness over ankles or MTPs.  CDAI Exam: CDAI Score: 0.4  Patient Global: 2 mm; Provider Global: 2 mm Swollen: 0 ; Tender: 0  Joint Exam 02/11/2020   No joint exam has been documented for this visit   There is currently no information documented on the homunculus. Go to the Rheumatology activity and complete the homunculus joint exam.  Investigation: No additional findings.  Imaging: No results found.  Recent Labs: Lab Results  Component Value Date   WBC 4.7 01/08/2020   HGB 15.3 01/08/2020   PLT 286 01/08/2020   NA 140 01/08/2020   K 5.1 01/08/2020   CL 100 01/08/2020   CO2 35 (H) 01/08/2020   GLUCOSE 130 (H) 01/08/2020   BUN 15 01/08/2020   CREATININE 0.63 (L) 01/08/2020   BILITOT 0.6 01/08/2020   ALKPHOS 85 03/19/2019   AST 10 01/08/2020   ALT 14 01/08/2020   PROT 7.0 01/08/2020   ALBUMIN 4.7 (H) 03/19/2019   CALCIUM 9.9 01/08/2020   GFRAA  106 01/08/2020    Speciality Comments: PLQ Eye Exam: 09/12/2019 WNL @ [facility] up in 1 year  Procedures:  No procedures performed Allergies: Sulfasalazine, Demerol [meperidine], Garlic, Other, and Sulfa antibiotics   Assessment / Plan:     Visit Diagnoses: Rheumatoid arthritis of multiple sites without rheumatoid factor (HCC) - RF negative, CCP negative, 1433 eta negative, ANA negative.  He is clinically doing very well with no synovitis on examination today.  High risk medication use - MTX 0.6 ml sq once weekly, folic acid 1 mg po daily, PLQ 200 mg BID his labs are stable.  His last eye examination was on September 12, 2019.  Labs are stable.  We will check labs again in December and then every 3 months to monitor for drug toxicity.  Primary osteoarthritis of both hands-he has DIP and PIP thickening with some discomfort.  PMR (polymyalgia rheumatica) (HCC)-he has no increased muscular weakness or tenderness.  Osteopenia of multiple sites - 03/21 T -1.9.  He is taking calcium and vitamin D and has been off bisphosphonates.  History of vitamin D deficiency-he is on supplement.  DDD (degenerative disc disease), cervical-he has limited range of motion of the cervical spine.  DDD (degenerative disc disease), thoracic-he has some thoracic kyphosis.  DDD (degenerative disc disease), lumbar-he recently had cortisone injection in his lower back.  Other fatigue-he has mild fatigue which persists.  History of prostate cancer  History of diabetes mellitus  History of sleep apnea  Heart block AV complete ([place])  Orders: No orders of the defined types were placed in this encounter.  No orders of the defined types were placed in this encounter.   Follow-Up Instructions: Return in about 5 months (around 07/11/2020) for Rheumatoid arthritis, Osteoarthritis.   Bo Merino, MD  Note - This record has been created using Editor, commissioning.  Chart creation errors have been sought,  but may not always  have been located. Such creation errors do not reflect on  the standard of medical care.

## 2020-02-03 ENCOUNTER — Other Ambulatory Visit: Payer: Self-pay | Admitting: Physician Assistant

## 2020-02-03 DIAGNOSIS — Z79899 Other long term (current) drug therapy: Secondary | ICD-10-CM

## 2020-02-04 NOTE — Telephone Encounter (Signed)
Last Visit: 08/14/2019 Next Visit: 02/11/2020 Labs: 01/08/2020 All the labs are within normal limits.  Current Dose per office note on 08/14/2019: MTX 0.6 ml sq once weekly  Okay to refill per Dr. Estanislado Pandy

## 2020-02-11 ENCOUNTER — Other Ambulatory Visit: Payer: Self-pay

## 2020-02-11 ENCOUNTER — Encounter: Payer: Self-pay | Admitting: Rheumatology

## 2020-02-11 ENCOUNTER — Ambulatory Visit (INDEPENDENT_AMBULATORY_CARE_PROVIDER_SITE_OTHER): Payer: Medicare Other | Admitting: Rheumatology

## 2020-02-11 VITALS — BP 144/70 | HR 85 | Resp 16 | Ht 73.0 in | Wt 171.0 lb

## 2020-02-11 DIAGNOSIS — Z8546 Personal history of malignant neoplasm of prostate: Secondary | ICD-10-CM

## 2020-02-11 DIAGNOSIS — M353 Polymyalgia rheumatica: Secondary | ICD-10-CM

## 2020-02-11 DIAGNOSIS — M0609 Rheumatoid arthritis without rheumatoid factor, multiple sites: Secondary | ICD-10-CM

## 2020-02-11 DIAGNOSIS — I442 Atrioventricular block, complete: Secondary | ICD-10-CM

## 2020-02-11 DIAGNOSIS — M5134 Other intervertebral disc degeneration, thoracic region: Secondary | ICD-10-CM

## 2020-02-11 DIAGNOSIS — Z8639 Personal history of other endocrine, nutritional and metabolic disease: Secondary | ICD-10-CM

## 2020-02-11 DIAGNOSIS — Z8669 Personal history of other diseases of the nervous system and sense organs: Secondary | ICD-10-CM

## 2020-02-11 DIAGNOSIS — Z79899 Other long term (current) drug therapy: Secondary | ICD-10-CM

## 2020-02-11 DIAGNOSIS — M5030 Other cervical disc degeneration, unspecified cervical region: Secondary | ICD-10-CM

## 2020-02-11 DIAGNOSIS — M8589 Other specified disorders of bone density and structure, multiple sites: Secondary | ICD-10-CM

## 2020-02-11 DIAGNOSIS — M19041 Primary osteoarthritis, right hand: Secondary | ICD-10-CM | POA: Diagnosis not present

## 2020-02-11 DIAGNOSIS — M5136 Other intervertebral disc degeneration, lumbar region: Secondary | ICD-10-CM

## 2020-02-11 DIAGNOSIS — R5383 Other fatigue: Secondary | ICD-10-CM

## 2020-02-11 DIAGNOSIS — M19042 Primary osteoarthritis, left hand: Secondary | ICD-10-CM

## 2020-02-11 NOTE — Patient Instructions (Signed)
Standing Labs We placed an order today for your standing lab work.   Please have your standing labs drawn in December and every 3 months  If possible, please have your labs drawn 2 weeks prior to your appointment so that the provider can discuss your results at your appointment.  We have open lab daily Monday through Thursday from 8:30-12:30 PM and 1:30-4:30 PM and Friday from 8:30-12:30 PM and 1:30-4:00 PM at the office of Dr. Bo Merino, [facility].   Please be advised, patients with office appointments requiring lab work will take precedents over walk-in lab work.  If possible, please come for your lab work on Monday and Friday afternoons, as you may experience shorter wait times. The office is located at [address], [place], [place], [place] [address] No appointment is necessary.   Labs are drawn by [facility]. Please bring your co-pay at the time of your lab draw.  You may receive a bill from [facility] for your lab work.  If you wish to have your labs drawn at another location, please call the office 24 hours in advance to send orders.  If you have any questions regarding directions or hours of operation,  please call [phone].   As a reminder, please drink plenty of water prior to coming for your lab work. Thanks!

## 2020-02-26 ENCOUNTER — Telehealth: Payer: Self-pay | Admitting: Cardiology

## 2020-02-26 DIAGNOSIS — I77810 Thoracic aortic ectasia: Secondary | ICD-10-CM

## 2020-02-26 DIAGNOSIS — I442 Atrioventricular block, complete: Secondary | ICD-10-CM

## 2020-02-26 NOTE — Telephone Encounter (Signed)
Patient called to schedule his annual echo. There were no orders in the chart. The patient wanted to know if Dr. Percival Spanish would put in orders to have one done this year. Please advise

## 2020-02-27 NOTE — Telephone Encounter (Signed)
Order placed for the echo and message sent to scheduling.

## 2020-02-27 NOTE — Telephone Encounter (Signed)
Yes.  Follow up enlarged aortic root.

## 2020-03-16 ENCOUNTER — Other Ambulatory Visit: Payer: Self-pay | Admitting: Rheumatology

## 2020-03-16 DIAGNOSIS — M0609 Rheumatoid arthritis without rheumatoid factor, multiple sites: Secondary | ICD-10-CM

## 2020-03-16 NOTE — Telephone Encounter (Signed)
Last Visit: 02/11/2020 Next Visit: 08/12/2020 Labs: 01/08/2020 All the labs are within normal limits Eye exam:  09/12/2019 WNL  Current Dose per office note 02/11/2020: PLQ 200 mg BID  DX: Rheumatoid arthritis of multiple sites without rheumatoid factor   Okay to refill per Dr. Estanislado Pandy

## 2020-03-17 ENCOUNTER — Ambulatory Visit (INDEPENDENT_AMBULATORY_CARE_PROVIDER_SITE_OTHER): Payer: Medicare Other

## 2020-03-17 DIAGNOSIS — I442 Atrioventricular block, complete: Secondary | ICD-10-CM

## 2020-03-17 LAB — CUP PACEART REMOTE DEVICE CHECK
Battery Remaining Longevity: 96 mo
Battery Voltage: 3 V
Brady Statistic AP VP Percent: 4.2 %
Brady Statistic AP VS Percent: 0 %
Brady Statistic AS VP Percent: 95.75 %
Brady Statistic AS VS Percent: 0.05 %
Brady Statistic RA Percent Paced: 4.19 %
Brady Statistic RV Percent Paced: 99.94 %
Date Time Interrogation Session: 20211206202507
Implantable Lead Implant Date: 20180827
Implantable Lead Implant Date: 20180827
Implantable Lead Location: 753859
Implantable Lead Location: 753860
Implantable Lead Model: 4076
Implantable Lead Model: 4076
Implantable Pulse Generator Implant Date: 20180827
Lead Channel Impedance Value: 323 Ohm
Lead Channel Impedance Value: 380 Ohm
Lead Channel Impedance Value: 380 Ohm
Lead Channel Impedance Value: 456 Ohm
Lead Channel Pacing Threshold Amplitude: 0.625 V
Lead Channel Pacing Threshold Amplitude: 0.875 V
Lead Channel Pacing Threshold Pulse Width: 0.4 ms
Lead Channel Pacing Threshold Pulse Width: 0.4 ms
Lead Channel Sensing Intrinsic Amplitude: 23.375 mV
Lead Channel Sensing Intrinsic Amplitude: 23.375 mV
Lead Channel Sensing Intrinsic Amplitude: 5.5 mV
Lead Channel Sensing Intrinsic Amplitude: 5.5 mV
Lead Channel Setting Pacing Amplitude: 1.5 V
Lead Channel Setting Pacing Amplitude: 2.5 V
Lead Channel Setting Pacing Pulse Width: 0.4 ms
Lead Channel Setting Sensing Sensitivity: 4 mV

## 2020-03-24 ENCOUNTER — Telehealth: Payer: Self-pay | Admitting: Neurology

## 2020-03-24 NOTE — Telephone Encounter (Signed)
Pt. is requesting a call from RN. Please advise.

## 2020-03-24 NOTE — Telephone Encounter (Signed)
Called the patient back. Jeffery [name] has concerns for memory that he would like to discuss with Dr Brett Fairy. He states this is also a concern for his wife. His wife is not a patient here. Advised a referral would need to be sent to our office. Informed that we are unable to book an apt for her until we have a referral. Pt is anxious to get in asap for both of them. However he will be out of town from 04/23/2020-06/26/2020. He is hoping to be seen back to back before they leave. I informed him that Dr Brett Fairy has a couple weeks that she is taking off 12/20-04/13/20. Advised that I would place them on wait list in case something opens up but as of this moment I do not have anything I can offer. I was able to get him on the books for March 21,2021 which is the first day we are open when they return. He is scheduled for 2:30 and I have held the 2 pm new pt slot to be used for his wife once the referral comes in. I did advise I would write their names down in case something comes open. Pt verbalized understanding.

## 2020-03-27 ENCOUNTER — Other Ambulatory Visit: Payer: Self-pay

## 2020-03-27 ENCOUNTER — Ambulatory Visit (HOSPITAL_COMMUNITY): Payer: Medicare Other | Attending: Internal Medicine

## 2020-03-27 DIAGNOSIS — I77810 Thoracic aortic ectasia: Secondary | ICD-10-CM

## 2020-03-27 DIAGNOSIS — I442 Atrioventricular block, complete: Secondary | ICD-10-CM | POA: Diagnosis present

## 2020-03-27 DIAGNOSIS — I4510 Unspecified right bundle-branch block: Secondary | ICD-10-CM | POA: Diagnosis present

## 2020-03-27 MED ORDER — PERFLUTREN LIPID MICROSPHERE
1.0000 mL | INTRAVENOUS | Status: AC | PRN
  Administered 2020-03-27: 2 mL via INTRAVENOUS

## 2020-03-27 NOTE — Progress Notes (Signed)
Remote pacemaker transmission.   

## 2020-03-28 LAB — ECHOCARDIOGRAM COMPLETE
Area-P 1/2: 2.32 cm2
S' Lateral: 2.7 cm

## 2020-04-12 ENCOUNTER — Encounter: Payer: Self-pay | Admitting: Neurology

## 2020-04-16 ENCOUNTER — Encounter: Payer: Self-pay | Admitting: Neurology

## 2020-04-16 ENCOUNTER — Other Ambulatory Visit: Payer: Self-pay

## 2020-04-16 ENCOUNTER — Ambulatory Visit (INDEPENDENT_AMBULATORY_CARE_PROVIDER_SITE_OTHER): Payer: Medicare Other | Admitting: Neurology

## 2020-04-16 VITALS — BP 151/83 | HR 104 | Ht 74.0 in | Wt 170.0 lb

## 2020-04-16 DIAGNOSIS — M0609 Rheumatoid arthritis without rheumatoid factor, multiple sites: Secondary | ICD-10-CM | POA: Diagnosis not present

## 2020-04-16 DIAGNOSIS — Z9989 Dependence on other enabling machines and devices: Secondary | ICD-10-CM

## 2020-04-16 DIAGNOSIS — G629 Polyneuropathy, unspecified: Secondary | ICD-10-CM | POA: Diagnosis not present

## 2020-04-16 DIAGNOSIS — G4731 Primary central sleep apnea: Secondary | ICD-10-CM

## 2020-04-16 DIAGNOSIS — R413 Other amnesia: Secondary | ICD-10-CM | POA: Diagnosis not present

## 2020-04-16 NOTE — Patient Instructions (Signed)
Management of Memory Problems  There are some general things you can do to help manage your memory problems.  Your memory may not in fact recover, but by using techniques and strategies you will be able to manage your memory difficulties better.  1)  Establish a routine. Try to establish and then stick to a regular routine.  By doing this, you will get used to what to expect and you will reduce the need to rely on your memory.  Also, try to do things at the same time of day, such as taking your medication or checking your calendar first thing in the morning. Think about think that you can do as a part of a regular routine and make a list.  Then enter them into a daily planner to remind you.  This will help you establish a routine.  2)  Organize your environment. Organize your environment so that it is uncluttered.  Decrease visual stimulation.  Place everyday items such as keys or cell phone in the same place every day (ie.  Basket next to front door) Use post it notes with a brief message to yourself (ie. Turn off light, lock the door) Use labels to indicate where things go (ie. Which cupboards are for food, dishes, etc.) Keep a notepad and pen by the telephone to take messages  3)  Memory Aids A diary or journal/notebook/daily planner Making a list (shopping list, chore list, to do list that needs to be done) Using an alarm as a reminder (kitchen timer or cell phone alarm) Using cell phone to store information (Notes, Calendar, Reminders) Calendar/White board placed in a prominent position Post-it notes  In order for memory aids to be useful, you need to have good habits.  It's no good remembering to make a note in your journal if you don't remember to look in it.  Try setting aside a certain time of day to look in journal.  4)  Improving mood and managing fatigue. There may be other factors that contribute to memory difficulties.  Factors, such as anxiety, depression and tiredness can  affect memory. Regular gentle exercise can help improve your mood and give you more energy. Simple relaxation techniques may help relieve symptoms of anxiety Try to get back to completing activities or hobbies you enjoyed doing in the past. Learn to pace yourself through activities to decrease fatigue. Find out about some local support groups where you can share experiences with others. Try and achieve 7-8 hours of sleep at night. There are well-accepted and sensible ways to reduce risk for Alzheimers disease and other degenerative brain disorders .  Exercise Daily Walk A daily 20 minute walk should be part of your routine. Disease related apathy can be a significant roadblock to exercise and the only way to overcome this is to make it a daily routine and perhaps have a reward at the end (something your loved one loves to eat or drink perhaps) or a personal trainer coming to the home can also be very useful. Most importantly, the patient is much more likely to exercise if the caregiver / spouse does it with him/her. In general a structured, repetitive schedule is best.  General Health: Any diseases which effect your body will effect your brain such as a pneumonia, urinary infection, blood clot, heart attack or stroke. Keep contact with your primary care doctor for regular follow ups.  Sleep. A good nights sleep is healthy for the brain. Seven hours is recommended. If you have   insomnia or poor sleep habits we can give you some instructions. If you have sleep apnea wear your mask.  Diet: Eating a heart healthy diet is also a good idea; fish and poultry instead of red meat, nuts (mostly non-peanuts), vegetables, fruits, olive oil or canola oil (instead of butter), minimal salt (use other spices to flavor foods), whole grain rice, bread, cereal and pasta and wine in moderation.Research is now showing that the MIND diet, which is a combination of The Mediterranean diet and the DASH diet, is beneficial for  cognitive processing and longevity. Information about this diet can be found in The MIND Diet, a book by Maggie Moon, MS, RDN, and online at https://www.healthline.com/nutrition/mind-diet  Finances, Power of Attorney and Advance Directives: You should consider putting legal safeguards in place with regard to financial and medical decision making. While the spouse always has power of attorney for medical and financial issues in the absence of any form, you should consider what you want in case the spouse / caregiver is no longer around or capable of making decisions.   The Alzheimers Association Position on Disease Prevention  Can Alzheimer's be prevented? It's a question that continues to intrigue researchers and fuel new investigations. There are no clear-cut answers yet -- partially due to the need for more large-scale studies in diverse populations -- but promising research is under way. The Alzheimer's Association is leading the worldwide effort to find a treatment for Alzheimer's, delay its onset and prevent it from developing.   What causes Alzheimer's? Experts agree that in the vast majority of cases, Alzheimer's, like other common chronic conditions, probably develops as a result of complex interactions among multiple factors, including age, genetics, environment, lifestyle and coexisting medical conditions. Although some risk factors -- such as age or genes -- cannot be changed, other risk factors -- such as high blood pressure and lack of exercise -- usually can be changed to help reduce risk. Research in these areas may lead to new ways to detect those at highest risk.  Prevention studies A small percentage of people with Alzheimer's disease (less than 1 percent) have an early-onset type associated with genetic mutations. Individuals who have these genetic mutations are guaranteed to develop the disease. An ongoing clinical trial conducted by the [facility] ([facility]),  is testing whether antibodies to beta-amyloid can reduce the accumulation of beta-amyloid plaque in the brains of people with such genetic mutations and thereby reduce, delay or prevent symptoms. Participants in the trial are receiving antibodies (or placebo) before they develop symptoms, and the development of beta-amyloid plaques is being monitored by brain scans and other tests.  Another clinical trial, known as the A4 trial (Anti-Amyloid Treatment in Asymptomatic Alzheimer's), is testing whether antibodies to beta-amyloid can reduce the risk of Alzheimer's disease in older people (ages 65 to 85) at high risk for the disease. The A4 trial is being conducted by the [facility]'s [facility].  Though research is still evolving, evidence is strong that people can reduce their risk by making key lifestyle changes, including participating in regular activity and maintaining good heart health. Based on this research, the Alzheimer's Association offers 10 Ways to Love Your Brain -- a collection of tips that can reduce the risk of cognitive decline.  Heart-head connection  New research shows there are things we can do to reduce the risk of mild cognitive impairment and dementia.  Several conditions known to increase the risk of cardiovascular disease -- such as high   blood pressure, diabetes and high cholesterol -- also increase the risk of developing Alzheimer's. Some autopsy studies show that as many as 80 percent of individuals with Alzheimer's disease also have cardiovascular disease.  A longstanding question is why some people develop hallmark Alzheimer's plaques and tangles but do not develop the symptoms of Alzheimer's. Vascular disease may help researchers eventually find an answer. Some autopsy studies suggest that plaques and tangles may be present in the brain without causing symptoms of cognitive decline unless the brain also shows evidence of vascular disease. More research is needed  to better understand the link between vascular health and Alzheimer's.  Physical exercise and diet Regular physical exercise may be a beneficial strategy to lower the risk of Alzheimer's and vascular dementia. Exercise may directly benefit brain cells by increasing blood and oxygen flow in the brain. Because of its known cardiovascular benefits, a medically approved exercise program is a valuable part of any overall wellness plan.  Current evidence suggests that heart-healthy eating may also help protect the brain. Heart-healthy eating includes limiting the intake of sugar and saturated fats and making sure to eat plenty of fruits, vegetables, and whole grains. No one diet is best. Two diets that have been studied and may be beneficial are the DASH (Dietary Approaches to Stop Hypertension) diet and the Mediterranean diet. The DASH diet emphasizes vegetables, fruits and fat-free or low-fat dairy products; includes whole grains, fish, poultry, beans, seeds, nuts and vegetable oils; and limits sodium, sweets, sugary beverages and red meats. A Mediterranean diet includes relatively little red meat and emphasizes whole grains, fruits and vegetables, fish and shellfish, and nuts, olive oil and other healthy fats.  Social connections and intellectual activity A number of studies indicate that maintaining strong social connections and keeping mentally active as we age might lower the risk of cognitive decline and Alzheimer's. Experts are not certain about the reason for this association. It may be due to direct mechanisms through which social and mental stimulation strengthen connections between nerve cells in the brain.  Head trauma There appears to be a strong link between future risk of Alzheimer's and serious head trauma, especially when injury involves loss of consciousness. You can help reduce your risk of Alzheimer's by protecting your head.  Wear a seat belt  Use a helmet when participating in sports   "Fall-proof" your home   What you can do now While research is not yet conclusive, certain lifestyle choices, such as physical activity and diet, may help support brain health and prevent Alzheimer's. Many of these lifestyle changes have been shown to lower the risk of other diseases, like heart disease and diabetes, which have been linked to Alzheimer's. With few drawbacks and plenty of known benefits, healthy lifestyle choices can improve your health and possibly protect your brain.  Learn more about brain health. You can help increase our knowledge by considering participation in a clinical study. Our free clinical trial matching services, Trial[employer], can help you find clinical trials in your area that are seeking volunteers.  Understanding prevention research Here are some things to keep in mind about the research underlying much of our current knowledge about possible prevention:  Insights about potentially modifiable risk factors apply to large population groups, not to individuals. Studies can show that factor X is associated with outcome Y, but cannot guarantee that any specific person will have that outcome. As a result, you can "do everything right" and still have a serious health problem or "do everything   wrong" and live to be 100.  Much of our current evidence comes from large epidemiological studies such as the [place] Aging Study, the Nurses' Health Study, the Adult Changes in Thought Study and the [place] Project. These studies explore pre-existing behaviors and use statistical methods to relate those behaviors to health outcomes. This type of study can show an "association" between a factor and an outcome but cannot "prove" cause and effect. This is why we describe evidence based on these studies with such language as "suggests," "may show," "might protect," and "is associated with."  The gold standard for showing cause and effect is a clinical trial in which participants are  randomly assigned to a prevention or risk management strategy or a control group. Researchers follow the two groups over time to see if their outcomes differ significantly.  It is unlikely that some prevention or risk management strategies will ever be tested in randomized trials for ethical or practical reasons. One example is exercise. Definitively testing the impact of exercise on Alzheimer's risk would require a huge trial enrolling thousands of people and following them for many years. The expense and logistics of such a trial would be prohibitive, and it would require some people to go without exercise, a known health benefit.   

## 2020-04-16 NOTE — Progress Notes (Signed)
[name]  Provider:  Larey Seat, MD.   Referring Provider: Shon Baton, MD Primary Care Physician:  Jeffery Baton, MD  Chief Complaint  Patient presents with   Follow-up    Pt alone, rm 11. Presents today for concerns with memory. Wanting to just get a baseline exam. He has not noticed any one thing in particular but wanted just established  a baseline thought and stay ahead.      RV and interval history: 04-16-2020: Jeffery [name] 'Jeffery [name] reports wanting to get a baseline. He just had seen Jeffery [name] who enforced having PT instead of surgery.  MOCA today 26/30 normal range and loss of 2 points on immediate recall.  Montreal Cognitive Assessment  04/16/2020  Visuospatial/ Executive (0/5) 5  Naming (0/3) 2  Attention: Read list of digits (0/2) 1  Attention: Read list of letters (0/1) 1  Attention: Serial 7 subtraction starting at 100 (0/3) 3  Language: Repeat phrase (0/2) 2  Language : Fluency (0/1) 1  Abstraction (0/2) 2  Delayed Recall (0/5) 3  Orientation (0/6) 6  Total 26   He had been placed on BiPAP for complex apnea and achieved an AHI of 3.1/h but has not used the machine ( due to recall )in a while he never used an ozone cleaner and feel he can use it. The geriatric depression score was  endorsed at 2 points. night blindness. Dysesthesias resolved under steroids, bradycardia on pacemaker. No fainting spells, or near to such.  BP was elevated today.        RV on 08-26-2019; I have the pleasure of meeting with Jeffery [name] today on 26 Aug 2019 patient is an 86 year old Caucasian gentleman who has been followed for CPAP therapy as well as neuropathy.  Previous sleep studies had found primary central sleep apnea which responded to CPAP therapy but was in the last several months they have been rising levels of residual AHI currently to 8.45 using CPAP as an AutoSet.  Therefore the patient was invited to return and see if he can actually titrate him to BiPAP.  We used  a nonmagnetic Clapacs near the right ear a full face medium and first began with a CPAP titration from 5-14 cmH2O it did not reduce the AHI significantly changed to BiPAP was made beginning at 14/10 cmH2O and immediately reducing the residual apnea to 1.5/h the best result was seen on 15/11 centimeters water pressure it seems that for this complex sleep apnea and for the periodic limb movements BiPAP therapy controlled both.  Also there was a reduction of the hypoxemia time to 29 to 24 minutes only which is much less than in the sleep study.  I would very much be in favor to change therapy to BiPAP and if possible to use a range between 14/10 up to 19/15 cmH2O to allow also adjustment depending on supine or nonsupine sleep position.he is between medium and large size for the Vitera mask.   I expected him to be awaiting the BiPAP to be issued.  He would consider rather bying ASV outright. He needs to return for a night. ASV.  I will explore.    Jeffery [name], 10, presents after he had an extensive visit with his ophthalmologist and retinal specialist , Jeffery [name], Whom he sees for Plaquenil follow up. 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  He also stated that treatment of obstructive sleep apnea may within a week resolve or alleviate the Tele-angiectasis.  Aside from the concern of snoring, there is also excessive daytime sleepiness loaders now the patient endorsed the Epworth Sleepiness Scale at 16 out of 24 possible points, but fatigue severity was only endorsed at 39 points.The patient's body mass index has not increased, but decreased. He remains with a deviated septum.  Jeffery [name] had been seen in 2011-sleep study obtained on 24 Aug 2009 confirmed an AHI also known as apnea hypopnea index of 27.7/h of  sleep, equal to almost 1 respiratory event every 2 minutes.  Supine there was much accentuation to a supine AHI of 75.6/h, there was only slight accentuation in rem sleep.  Interesting is that the patient had 8 obstructive apneas alongside 124 central and 16 mixed apneas.  There were very few hypopneas.  Central apnea however is not always responsive to CPAP.  He returned for a CPAP and BiPAP titration on 14 September 2009 and did not tolerate or respond well to pressures between 4 and 8 cmH2O CPAP pressure.  Subsequently BiPAP was initiated he tried several masks settling on a full facemask at the most is the most acceptable.  There was no optimal pressure on BiPAP found either. He just couldn't sleep. Jeffery Jeffery [name] interpreted and recommended 7 cm CPAP as the best solution.   Jeffery [name] is seen on 23 February 2017 and a follow-up visit after recent cervical spine MRI was obtained.  In the meantime he has received a pacemaker, and is followed by Jeffery [name] through [facility].  Symptoms occurred in [place], [place] where he was diagnosed and his care was transferred here, he received a temporary pacemaker in [place] .  Treatment for severe bradycardia. diaphoretic and with palpitation, bowel incontinence.  In the interim he had on 11-28-2016 an MRI - he called Jeffery [name] and has not made a follow up with him. Post-pacemaker implantation he had left arm and wrist pain, was treated with prednisone and has not had symptoms in neck and hands since! Marland Kitchen   08-21-2017, well adjusted to his pacemaker, no complications. No syncope, remains numb behind the ear. We will follow his complex apnea, neuropathy as needed.  No concerns of sleep , no pain.    11-01-2016. Jeffery [name] is a 86 y.o. male seen here as a revisit  from Jeffery [name] for a newly noted area of dysesthesia behind the left ear.  There is no numbness- the skin sensation is intermittently disturbed, tingly, like a fine touch. I evaluated the area and it  does not quite fit a dermatomal distribution-Area is retroauricular and would be innervated by C2. There is no spasm.   This patient of Jeffery [name] Jeffery. Corliss [name] has followed up since May 2011. Initially he was referred for a polysomnogram based on chronic fatigue complaints and was diagnosed as complex apnea.  The apnea short-billed complements central and obstructive, but in a distribution that was more typical for a with apnea.  They were neuropathy related labs sent out and 2011 as well as also to address chronic fatigue and all returned to normal range. The patient clearly has an accentuated AHI by position and during REM sleep. He has been healthy and maintains an active lifestyle is traveling is a nonsmoker nondrinker and has nausea history. The patient responded well to Wellbutrin on only 100 mg daily taken  to eliminate or reduce central apneas. During his last visit in 2013 his fatigue score was 20 and his Epworth score 14 points and he had reported one nocturia on average per night. He estimated his daily sleep time is 7 hours or more. He sometimes naps in the afternoons. This is still the case today:  his Epworth is 11 points, and fatigue score is 24. I would like to add that the patient is on chronic Plaquenil therapy. This was initiated after he developed severe muscle stiffness and relief this is the symptoms within 2-3 weeks. 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 Plaquenil  may contribute somewhat to the development of the neuropathy. Wellbutrin has had some positive effect on the level of alertness and daytime and may also be due discomfort.  03-03-15 Jeffery [name] reports that he had taken a drug holiday and for about 6 months did not use Plaquenil. As the responds he noted again stiffness in his hands and inflammation. Jeffery. Estanislado Pandy , his rheumatologist, had placed him on his prednisone  taper but then Plaquenil was reinitiated about 3 months ago. I would like to add that the stiffness has never affected his lower extremities or feet and has always been manifested in his hands. He also has 3 trigger fingers. Fatigue is stable. His Epworth score is endorsed at 9 points and his fatigue severity at 34 points. The geriatric depression score was  endorsed at 2 points.  We are seeing today after Jeffery [name] suffered a sudden fall with a laceration of the left for head and fracture of the left thumb. He was walking briskly but on an uneven surface and initially believed that he tripped and fell. But he had as sudden premonition to slow down,  before he fell which tells me that he most likely fainted. He has no history of seizures, he was able to break his fall but had no focal deficits Jeffery [name] is a fall and also did not feel that he had problems was generating words, but his thought process was scrambled or that he had any focal motor deficits. This rules out a TIA.  Once he was on the ground he was immediately conscious and aware of his surroundings- he was not combative and he will actually waited patiently for EMS to arrive. The ED reports were reviewed here with him. A cardiac workup was initiated and his transthoracic echo cardiogram revealed no abnormalities.  He also underwent carotid Doppler studies which showed minor in my opinion trivial calcifications at the bifurcation. These would be in the range between 1 and 40% the nonsurgical indication range. I would like for the patient to take a baby aspirin is possibly enteric coated once a day should he develop bruising I will like him to take it every other day. I also discussed with him that we have never established at baseline for his neuropathy. I would like for him as early as available in the new year to undergo an EMG and nerve conduction study to evaluate the degree of peripheral neuropathy and axonal versus demyelinating quality of the  neuropathy. The patient is interested. A recent comprehensive metabolic panel was Jeffery. Franchot Gallo was interpreted as normal , as well as a CBC diff  which she has every 3 months.   Review of Systems: Out of a complete 14 system review, the patient complains of only the following symptoms, and all other reviewed systems are negative.  Intermittent fatigue. Fatigue is stable. His Epworth score is endorsed at 7 points and his fatigue severity at 27 points.  The geriatric depression score was  endorsed at 2 points. night blindness. Dysesthesias resolved under steroids, bradycardia on pacemaker.    Eye clinic at [facility] - Plaquenil (Hydroxychloroquine) Screening  I, the attending physician, performed the HPI with the patient and updated the documentation appropriately. The patient is here for a return visit. The patient is here today for Plaquenil (Hydroxychloroquine) screening. Patient has Rheumatoid Arthritis. The patient has been taking Plaquenil (Hydroxychloroquine) for more than 5 years. The patient has been taking Plaquenil (Hydroxychloroquine) for approximately 7 years. The patient reports no changes in their vision. Known co-existing eye disease: macular degeneration.  The patient does not request a new glasses prescription. Review of eye symptoms reveals: Negative for flashes, eye pain and floaters.  Last edited by Hyman Hopes, MD on 08/17/2017 3:35 PM. (History)     Social History   Socioeconomic History   Marital status: Married    Spouse name: Not on file   Number of children: 2   Years of education: Not on file   Highest education level: Not on file  Occupational History   Not on file  Tobacco Use   Smoking status: Never Smoker   Smokeless tobacco: Never Used  Vaping Use   Vaping Use: Never used  Substance and Sexual Activity   Alcohol use: Not Currently   Drug use: No   Sexual activity: Not on file  Other Topics Concern   Not on file  Social History  Narrative   Right handed, Retired .Married, 2 kids. Caffeine none.   3 Step    Social Determinants of Health   Financial Resource Strain: Not on file  Food Insecurity: Not on file  Transportation Needs: Not on file  Physical Activity: Not on file  Stress: Not on file  Social Connections: Not on file  Intimate Partner Violence: Not on file    Family History  Problem Relation Age of Onset   Heart failure Mother        Died age 21.  Apparently had valve surgery.    Congenital heart disease Brother     Past Medical History:  Diagnosis Date   Arthritis    BCC (basal cell carcinoma)    BPH (benign prostatic hypertrophy) 2008   Cancer [facility]) 2008   Prostate   Cervical stenosis of spine    per patient    Complete heart block (HCC)    Complex sleep apnea syndrome    does not use CPAP   Diabetes mellitus without complication (HCC)    Diverticulosis    ED (erectile dysfunction)    GERD (gastroesophageal reflux disease)    Hemorrhoids    Hernia, inguinal    Hyperlipidemia    IFG (impaired fasting glucose)    Osteopenia    Peptic ulcer disease with hemorrhage 1993   Shingles    Vitamin D deficiency     Past Surgical History:  Procedure Laterality Date   BACK SURGERY N/A 1984   CATARACT EXTRACTION, BILATERAL     HERNIA REPAIR     LAMINECTOMY  1984   L 5   PACEMAKER IMPLANT  12/05/2016   MDT Azure XT Jeffery MRI conditional PPM implanted for CHB by Jeffery [employer] Deck in [place]  12/05/2016   PROSTATE SURGERY  12/2006   Robotic   TONSILLECTOMY  86 years old   [place]  RELEASE  03/01/2012   Procedure: MINOR RELEASE TRIGGER FINGER/A-1 PULLEY;  Surgeon: Cammie Sickle., MD;  Location: [place];  Service: Orthopedics;  Laterality: Left;  long finger   TRIGGER FINGER RELEASE Left 02/08/2018   Procedure: RELEASE TRIGGER FINGER/A-1 PULLEY LEFT RING FINGER;  Surgeon: Daryll Brod, MD;  Location: [place];  Service: Orthopedics;  Laterality: Left;    Current Outpatient Medications  Medication Sig Dispense Refill   acetaminophen (TYLENOL) 500 MG tablet Take 500 mg by mouth every 6 (six) hours as needed.     Calcium Carbonate (CALCIUM 600 PO) Take 600 mg by mouth daily.     Cholecalciferol (VITAMIN D) 2000 units CAPS Take by mouth daily.     Cholecalciferol (VITAMIN D3) 1.25 MG (50000 UT) CAPS TAKE 1 CAPSULE BY MOUTH ONCE PER MONTH (Patient taking differently: 3 (three) times a week.) 12 capsule 0   Coenzyme Q10 200 MG capsule Take 200 mg by mouth 2 (two) times daily.      colesevelam (WELCHOL) 625 MG tablet Take 1,250 mg by mouth 3 (three) times daily.      diclofenac sodium (VOLTAREN) 1 % GEL Apply 2-4 grams to affected joint, up to 4 times daily. (Patient taking differently: Apply 2-4 grams to affected joint, up to 4 times daily as needed) A999333 g 2   folic acid (FOLVITE) 1 MG tablet TAKE 1 TABLET BY MOUTH ONCE DAILY 90 tablet 3   hydroxychloroquine (PLAQUENIL) 200 MG tablet TAKE 1 TABLET(200 MG) BY MOUTH TWICE DAILY 180 tablet 0   metFORMIN (GLUCOPHAGE) 500 MG tablet Take 500 mg daily by mouth.      Methotrexate Sodium (METHOTREXATE, PF,) 50 MG/2ML injection ADMINISTER 0.6 ML UNDER THE SKIN 1 TIME A WEEK 8 mL 0   omega-3 acid ethyl esters (LOVAZA) 1 g capsule TAKE 1 CAPSULE BY MOUTH THREE TIMES A WEEK 36 capsule 0   ONE TOUCH ULTRA TEST test strip TEST BLOOD SUGAR BID AS DIRECTED  2   SAFETY-LOK TB SYRINGE 27GX.5" 27G X 1/2" 1 ML MISC USE TO INJECT METHOTREXATE 12 each 3   vitamin E 100 UNIT capsule Take 100 Units by mouth daily.     zolpidem (AMBIEN) 5 MG tablet Take 5 mg by mouth at bedtime.     No current facility-administered medications for this visit.    Allergies as of 04/16/2020 - Review Complete 04/16/2020  Allergen Reaction Noted   Sulfasalazine Anaphylaxis 02/08/2016   Demerol [meperidine] Nausea Only 123XX123   Garlic  [id number]   Other   09/25/2017   Sulfa antibiotics  02/23/2017    Vitals: BP (!) 151/83    Pulse (!) 104    Ht 6\' 2"  (1.88 m)    Wt 170 lb (77.1 kg)    BMI 21.83 kg/m  Last Weight:  Wt Readings from Last 1 Encounters:  04/16/20 170 lb (77.1 kg)   Last Height:   Ht Readings from Last 1 Encounters:  04/16/20 6\' 2"  (1.88 m)   DOB: Jul 01, 1934 MRN: [id number]   Physical exam: General: The patient is awake, alert and appears not in acute distress. The patient is well groomed. Head: Normocephalic, atraumatic. Neck is supple. Mallampati 2 , neck circumference: 15 inches.   Cardiovascular:  Regular rate and rhythm, without  murmurs -without distended neck veins. Respiratory: Lungs are clear to auscultation. Skin:  Without evidence of edema, or rash Trunk: BMI is normal. He lost weight by design.   Neurologic exam: The  patient is awake and alert, oriented to place and time.   Memory subjective described as intact. There is a normal attention span & concentration ability.  Speech is fluent without dysarthria, dysphonia or aphasia.  Mood and affect are appropriate.  Cranial nerves: Pupils are equal and briskly reactive to light.  Funduscopic exam - quoted from Jeffery. Radene Ou notes.  Extraocular movements  in vertical and horizontal planes intact and without nystagmus. Visual fields by finger perimetry are intact.Hearing to finger rub intact.Facial sensation intact to fine touch.  Facial motor strength is symmetric , his  tongue and uvula move midline. Motor exam:  Low muscle mass.   Deferred.left grip strength is limited after trigger finger surgery.  Sensory:   I did not test the planta pedis.  Coordination: Rapid alternating movements in the fingers/hands is normal.  Finger-to-nose maneuver tested and normal without evidence of ataxia, dysmetria or tremor. Gait and station:  Stance is stable and normal. Deep tendon reflexes: in the extremities are attenuated , symmetric and intact.   Assessment:   20  minute discussion- MOCA was in normal range.  After physical and neurologic examination, review of laboratory studies, imaging, neurophysiology testing and pre-existing records, assessment is   1) EDS- Epworth 12 points - elevated.  Central dominant Sleep Apnea was found  in 2011 - now his ophthalmologist noted retinal changes suggesting a correlation to untreated sleep apnea. I like for him to have an ASAP attended split night study.  He has not responded any longer to CPAP, and was just titrated to BiPAP.   2) new onset dysphagia- delayed clearing of the throat. Not for liquid.  Mouth dryness-  He has some cervical stenosis, but also stiffness, tension. Jeffery Dyke Brackett sees him.  His consult with London Pepper, MD ended in a recommendation of PT. For cervical and lumbal stenosis. . Neck therapy, PT in the meantime.   3) neuropathy - question if related to autoimmune sytem disorder or plaquenil. Mouth dryness.  Recommended Oasis mouth wash- Biotin. No PLMs when apnea controlled. autoimmune component treated with MTX.  There is no progression in my view.    Plan:  Treatment plan and additional workup :   Continue BiPAP use: Continue your exercises MIND diet. Social and physical activity is important. Stimulation of mind and body.  Jeffery Seat, MD. CC Dyke Brackett, MD and Jeffery Baton, MD

## 2020-05-05 ENCOUNTER — Ambulatory Visit: Payer: Medicare Other | Admitting: Neurology

## 2020-05-11 ENCOUNTER — Ambulatory Visit: Payer: Medicare Other | Admitting: Neurology

## 2020-05-13 ENCOUNTER — Other Ambulatory Visit: Payer: Self-pay | Admitting: *Deleted

## 2020-05-13 DIAGNOSIS — Z79899 Other long term (current) drug therapy: Secondary | ICD-10-CM

## 2020-05-14 LAB — CBC WITH DIFFERENTIAL/PLATELET
Absolute Monocytes: 457 cells/uL (ref 200–950)
Basophils Absolute: 11 cells/uL (ref 0–200)
Basophils Relative: 0.2 %
Eosinophils Absolute: 99 cells/uL (ref 15–500)
Eosinophils Relative: 1.8 %
HCT: 43.5 % (ref 38.5–50.0)
Hemoglobin: 14.8 g/dL (ref 13.2–17.1)
Lymphs Abs: 1782 cells/uL (ref 850–3900)
MCH: 32 pg (ref 27.0–33.0)
MCHC: 34 g/dL (ref 32.0–36.0)
MCV: 94 fL (ref 80.0–100.0)
MPV: 9.1 fL (ref 7.5–12.5)
Monocytes Relative: 8.3 %
Neutro Abs: 3152 cells/uL (ref 1500–7800)
Neutrophils Relative %: 57.3 %
Platelets: 301 10*3/uL (ref 140–400)
RBC: 4.63 10*6/uL (ref 4.20–5.80)
RDW: 11.9 % (ref 11.0–15.0)
Total Lymphocyte: 32.4 %
WBC: 5.5 10*3/uL (ref 3.8–10.8)

## 2020-05-14 LAB — COMPLETE METABOLIC PANEL WITH GFR
AG Ratio: 1.8 (calc) (ref 1.0–2.5)
ALT: 15 U/L (ref 9–46)
AST: 8 U/L — ABNORMAL LOW (ref 10–35)
Albumin: 4.2 g/dL (ref 3.6–5.1)
Alkaline phosphatase (APISO): 68 U/L (ref 35–144)
BUN/Creatinine Ratio: 23 (calc) — ABNORMAL HIGH (ref 6–22)
BUN: 16 mg/dL (ref 7–25)
CO2: 31 mmol/L (ref 20–32)
Calcium: 9.7 mg/dL (ref 8.6–10.3)
Chloride: 102 mmol/L (ref 98–110)
Creat: 0.69 mg/dL — ABNORMAL LOW (ref 0.70–1.11)
GFR, Est African American: 102 mL/min/{1.73_m2} (ref >=60)
GFR, Est Non African American: 88 mL/min/{1.73_m2} (ref >=60)
Globulin: 2.3 g/dL (calc) (ref 1.9–3.7)
Glucose, Bld: 116 mg/dL — ABNORMAL HIGH (ref 65–99)
Potassium: 5.3 mmol/L (ref 3.5–5.3)
Sodium: 141 mmol/L (ref 135–146)
Total Bilirubin: 0.5 mg/dL (ref 0.2–1.2)
Total Protein: 6.5 g/dL (ref 6.1–8.1)

## 2020-05-17 ENCOUNTER — Other Ambulatory Visit: Payer: Self-pay | Admitting: Rheumatology

## 2020-05-17 DIAGNOSIS — Z79899 Other long term (current) drug therapy: Secondary | ICD-10-CM

## 2020-05-18 NOTE — Telephone Encounter (Signed)
Last Visit: 02/11/2020 Next Visit: 08/12/2020 Labs:  05/13/2020, CBC WNL. Glucose was 116. Creatinine is borderline low but stable. Rest of CMP was stable. We will continue to monitor.   Current Dose per office note 02/11/2020, MTX 0.6 ml sq once weekly DX: Rheumatoid arthritis of multiple sites without rheumatoid factor   Okay to refill MTX?

## 2020-06-16 ENCOUNTER — Ambulatory Visit (INDEPENDENT_AMBULATORY_CARE_PROVIDER_SITE_OTHER): Payer: Medicare Other

## 2020-06-16 DIAGNOSIS — I442 Atrioventricular block, complete: Secondary | ICD-10-CM

## 2020-06-16 LAB — CUP PACEART REMOTE DEVICE CHECK
Battery Remaining Longevity: 93 mo
Battery Voltage: 3 V
Brady Statistic AP VP Percent: 2.25 %
Brady Statistic AP VS Percent: 0 %
Brady Statistic AS VP Percent: 97.68 %
Brady Statistic AS VS Percent: 0.07 %
Brady Statistic RA Percent Paced: 2.24 %
Brady Statistic RV Percent Paced: 99.93 %
Date Time Interrogation Session: 20220307204210
Implantable Lead Implant Date: 20180827
Implantable Lead Implant Date: 20180827
Implantable Lead Location: 753859
Implantable Lead Location: 753860
Implantable Lead Model: 4076
Implantable Lead Model: 4076
Implantable Pulse Generator Implant Date: 20180827
Lead Channel Impedance Value: 342 Ohm
Lead Channel Impedance Value: 399 Ohm
Lead Channel Impedance Value: 399 Ohm
Lead Channel Impedance Value: 456 Ohm
Lead Channel Pacing Threshold Amplitude: 0.625 V
Lead Channel Pacing Threshold Amplitude: 0.875 V
Lead Channel Pacing Threshold Pulse Width: 0.4 ms
Lead Channel Pacing Threshold Pulse Width: 0.4 ms
Lead Channel Sensing Intrinsic Amplitude: 23.375 mV
Lead Channel Sensing Intrinsic Amplitude: 23.375 mV
Lead Channel Sensing Intrinsic Amplitude: 5.125 mV
Lead Channel Sensing Intrinsic Amplitude: 5.125 mV
Lead Channel Setting Pacing Amplitude: 1.5 V
Lead Channel Setting Pacing Amplitude: 2.5 V
Lead Channel Setting Pacing Pulse Width: 0.4 ms
Lead Channel Setting Sensing Sensitivity: 4 mV

## 2020-06-25 NOTE — Progress Notes (Signed)
Remote pacemaker transmission.   

## 2020-06-28 DIAGNOSIS — G4730 Sleep apnea, unspecified: Secondary | ICD-10-CM | POA: Insufficient documentation

## 2020-06-28 DIAGNOSIS — E785 Hyperlipidemia, unspecified: Secondary | ICD-10-CM | POA: Insufficient documentation

## 2020-06-28 NOTE — Progress Notes (Signed)
Cardiology Office Note   Date:  06/29/2020   ID:  Jeffery [name], Jeffery [name] December 29, 1934, MRN [id number]  PCP:  Shon Baton, MD  Cardiologist:   Minus Breeding, MD   Chief Complaint  Patient presents with  . Follow-up  . aortic root enlargement           History of Present Illness: Jeffery [name] is a 86 y.o. male who presents for follow up of third degree HB.  He has a pacemaker per Dr. Rayann Heman.  He had a follow up echo in December.  He has mildly enlarged aortic root.   Since I last saw him he has done well.  He has been at [facility] where he spends several weeks every winter.  He has been a little bit limited because of some cervical spinal stenosis.  He has had injections and PT.  The patient denies any new symptoms such as chest discomfort, neck or arm discomfort. There has been no new shortness of breath, PND or orthopnea. There have been no reported palpitations, presyncope or syncope.   Past Medical History:  Diagnosis Date  . Arthritis   . BCC (basal cell carcinoma)   . BPH (benign prostatic hypertrophy) 2008  . Cancer [facility]) 2008   Prostate  . Cervical stenosis of spine    per patient   . Complete heart block ([place])   . Complex sleep apnea syndrome    does not use CPAP  . Diabetes mellitus without complication ([place])   . Diverticulosis   . ED (erectile dysfunction)   . GERD (gastroesophageal reflux disease)   . Hemorrhoids   . Hernia, inguinal   . Hyperlipidemia   . IFG (impaired fasting glucose)   . Osteopenia   . Peptic ulcer disease with hemorrhage 1993  . Shingles   . Vitamin D deficiency     Past Surgical History:  Procedure Laterality Date  . BACK SURGERY N/A 1984  . CATARACT EXTRACTION, BILATERAL    . HERNIA REPAIR    . LAMINECTOMY  1984   L 5  . PACEMAKER IMPLANT  12/05/2016   MDT Azure XT DR MRI conditional PPM implanted for CHB by Dr Vista Deck in [place]  . PACEMAKER INSERTION  12/05/2016  . PROSTATE SURGERY  12/2006   Robotic  .  TONSILLECTOMY  86 years old  . TRIGGER FINGER RELEASE  03/01/2012   Procedure: MINOR RELEASE TRIGGER FINGER/A-1 PULLEY;  Surgeon: Cammie Sickle., MD;  Location: [place];  Service: Orthopedics;  Laterality: Left;  long finger  . TRIGGER FINGER RELEASE Left 02/08/2018   Procedure: RELEASE TRIGGER FINGER/A-1 PULLEY LEFT RING FINGER;  Surgeon: Daryll Brod, MD;  Location: [place];  Service: Orthopedics;  Laterality: Left;     Current Outpatient Medications  Medication Sig Dispense Refill  . acetaminophen (TYLENOL) 500 MG tablet Take 500 mg by mouth every 6 (six) hours as needed.    . Calcium Carbonate (CALCIUM 600 PO) Take 600 mg by mouth daily.    . Cholecalciferol (VITAMIN D) 2000 units CAPS Take by mouth daily.    . Cholecalciferol (VITAMIN D3) 1.25 MG (50000 UT) CAPS TAKE 1 CAPSULE BY MOUTH ONCE PER MONTH (Patient taking differently: 3 (three) times a week.) 12 capsule 0  . Coenzyme Q10 200 MG capsule Take 200 mg by mouth 2 (two) times daily.     . colesevelam (WELCHOL) 625 MG tablet Take 1,250 mg by mouth 3 (three) times  daily.     . diclofenac sodium (VOLTAREN) 1 % GEL Apply 2-4 grams to affected joint, up to 4 times daily. (Patient taking differently: Apply 2-4 grams to affected joint, up to 4 times daily as needed) 357 g 2  . folic acid (FOLVITE) 1 MG tablet TAKE 1 TABLET BY MOUTH ONCE DAILY 90 tablet 3  . hydroxychloroquine (PLAQUENIL) 200 MG tablet TAKE 1 TABLET(200 MG) BY MOUTH TWICE DAILY 180 tablet 0  . metFORMIN (GLUCOPHAGE) 500 MG tablet Take 500 mg daily by mouth.     . Methotrexate Sodium (METHOTREXATE, PF,) 50 MG/2ML injection Inject 0.6 ml subcutaneously once weekly. 8 mL 0  . omega-3 acid ethyl esters (LOVAZA) 1 g capsule TAKE 1 CAPSULE BY MOUTH THREE TIMES A WEEK 36 capsule 0  . ONE TOUCH ULTRA TEST test strip TEST BLOOD SUGAR BID AS DIRECTED  2  . SAFETY-LOK TB SYRINGE 27GX.5" 27G X 1/2" 1 ML MISC USE TO INJECT METHOTREXATE 12 each 3  .  vitamin E 100 UNIT capsule Take 100 Units by mouth daily.    Marland Kitchen zolpidem (AMBIEN) 5 MG tablet Take 5 mg by mouth at bedtime.     No current facility-administered medications for this visit.    Allergies:   Sulfasalazine, Demerol [meperidine], Garlic, Other, and Sulfa antibiotics    ROS:  Please see the history of present illness.   Otherwise, review of systems are positive for none.   All other systems are reviewed and negative.    PHYSICAL EXAM: VS:  BP 140/74   Pulse 93   Ht 6\' 2"  (1.88 m)   Wt 167 lb 3.2 oz (75.8 kg)   BMI 21.47 kg/m  , BMI Body mass index is 21.47 kg/m. GENERAL:  Well appearing NECK:  No jugular venous distention, waveform within normal limits, carotid upstroke brisk and symmetric, no bruits, no thyromegaly LUNGS:  Clear to auscultation bilaterally CHEST:  Unremarkable HEART:  PMI not displaced or sustained,S1 and S2 within normal limits, no S3, no S4, no clicks, no rubs, no murmurs ABD:  Flat, positive bowel sounds normal in frequency in pitch, no bruits, no rebound, no guarding, no midline pulsatile mass, no hepatomegaly, no splenomegaly EXT:  2 plus pulses throughout, no edema, no cyanosis no clubbing   EKG:  EKG is ordered today. The ekg ordered today demonstrates sinus rhythm rate 93, ventricular capture 100%   Recent Labs: 05/13/2020: ALT 15; BUN 16; Creat 0.69; Hemoglobin 14.8; Platelets 301; Potassium 5.3; Sodium 141    Lipid Panel No results found for: CHOL, TRIG, HDL, CHOLHDL, VLDL, LDLCALC, LDLDIRECT    Wt Readings from Last 3 Encounters:  06/29/20 167 lb 3.2 oz (75.8 kg)  04/16/20 170 lb (77.1 kg)  02/11/20 171 lb (77.6 kg)      Other studies Reviewed: Additional studies/ records that were reviewed today include: Echo. Review of the above records demonstrates:  Please see elsewhere in the note.     ASSESSMENT AND PLAN:  CHB:   He is up to date with follow up remotely and had normal function on the device check earlier this month.  I  reviewed these records for this visit.      SLEEP APNEA:    He has been not using the machine for about a month because he is having some trouble with it but he is going to restart.  HTN:     His blood pressure is at target.  No change in therapy.  DYSLIPIDEMIA:  LDL is 89 with an HDL of 57.  No change in therapy.  We discussed his lipids.  His LDL is slightly elevated but he would not want take a statin.  No change in therapy.  AORTIC ROOT DILATATION.   I will measure this with a CT again in 1 year and follow this clinically.  Current medicines are reviewed at length with the patient today.  The patient does not have concerns regarding medicines.  The following changes have been made:  no change  Labs/ tests ordered today include:   Orders Placed This Encounter  Procedures  . CT ANGIO CHEST AORTA W/CM & OR WO/CM  . EKG 12-Lead     Disposition:   FU with me in one year.     Signed, Minus Breeding, MD  06/29/2020 1:33 PM    [facility]

## 2020-06-29 ENCOUNTER — Other Ambulatory Visit: Payer: Self-pay

## 2020-06-29 ENCOUNTER — Encounter: Payer: Self-pay | Admitting: Cardiology

## 2020-06-29 ENCOUNTER — Ambulatory Visit: Payer: Medicare Other | Admitting: Neurology

## 2020-06-29 ENCOUNTER — Ambulatory Visit (INDEPENDENT_AMBULATORY_CARE_PROVIDER_SITE_OTHER): Payer: Medicare Other | Admitting: Cardiology

## 2020-06-29 VITALS — BP 140/74 | HR 93 | Ht 74.0 in | Wt 167.2 lb

## 2020-06-29 DIAGNOSIS — G4730 Sleep apnea, unspecified: Secondary | ICD-10-CM

## 2020-06-29 DIAGNOSIS — E785 Hyperlipidemia, unspecified: Secondary | ICD-10-CM | POA: Diagnosis not present

## 2020-06-29 DIAGNOSIS — I77810 Thoracic aortic ectasia: Secondary | ICD-10-CM | POA: Diagnosis not present

## 2020-06-29 NOTE — Patient Instructions (Signed)
Medication Instructions:  Your physician recommends that you continue on your current medications as directed. Please refer to the Current Medication list given to you today.  *If you need a refill on your cardiac medications before your next appointment, please call your pharmacy*  Testing/Procedures: CTA chest/aorta in 1 year (06/2021)  Follow-Up: At [facility], you and your health needs are our priority.  As part of our continuing mission to provide you with exceptional heart care, we have created designated Provider Care Teams.  These Care Teams include your primary Cardiologist (physician) and Advanced Practice Providers (APPs -  Physician Assistants and Nurse Practitioners) who all work together to provide you with the care you need, when you need it.  We recommend signing up for the patient portal called "MyChart".  Sign up information is provided on this After Visit Summary.  MyChart is used to connect with patients for Virtual Visits (Telemedicine).  Patients are able to view lab/test results, encounter notes, upcoming appointments, etc.  Non-urgent messages can be sent to your provider as well.   To learn more about what you can do with MyChart, go to [email or web address].    Your next appointment:   12 month(s) (after CTA completed)  The format for your next appointment:   In Person  Provider:   Minus Breeding, MD

## 2020-07-12 ENCOUNTER — Other Ambulatory Visit: Payer: Self-pay | Admitting: Rheumatology

## 2020-07-12 DIAGNOSIS — M0609 Rheumatoid arthritis without rheumatoid factor, multiple sites: Secondary | ICD-10-CM

## 2020-07-13 NOTE — Telephone Encounter (Signed)
Last Visit: 02/11/2020 Next Visit: 08/12/2020 Labs: 05/13/2020, CBC WNL. Glucose was 116. Creatinine is borderline low but stable. Rest of CMP was stable. We will continue to monitor.  Eye exam: 09/11/2019  Current Dose per office note 02/11/2020, PLQ 200 mg BID  DX: Rheumatoid arthritis of multiple sites without rheumatoid factor  Last Fill: 03/15/2020  Okay to refill Plaquenil?

## 2020-07-27 ENCOUNTER — Encounter (INDEPENDENT_AMBULATORY_CARE_PROVIDER_SITE_OTHER): Payer: Self-pay | Admitting: Ophthalmology

## 2020-07-27 ENCOUNTER — Other Ambulatory Visit: Payer: Self-pay

## 2020-07-27 ENCOUNTER — Ambulatory Visit (INDEPENDENT_AMBULATORY_CARE_PROVIDER_SITE_OTHER): Payer: Medicare Other | Admitting: Ophthalmology

## 2020-07-27 ENCOUNTER — Encounter (INDEPENDENT_AMBULATORY_CARE_PROVIDER_SITE_OTHER): Payer: Medicare Other | Admitting: Ophthalmology

## 2020-07-27 DIAGNOSIS — H33321 Round hole, right eye: Secondary | ICD-10-CM

## 2020-07-27 DIAGNOSIS — Z79899 Other long term (current) drug therapy: Secondary | ICD-10-CM

## 2020-07-27 DIAGNOSIS — H35071 Retinal telangiectasis, right eye: Secondary | ICD-10-CM

## 2020-07-27 DIAGNOSIS — H35351 Cystoid macular degeneration, right eye: Secondary | ICD-10-CM | POA: Diagnosis not present

## 2020-07-27 NOTE — Assessment & Plan Note (Signed)
Symptoms have resolved with restitution and use of sent CPAP for his central sleep apnea no recurrences have been detected on OCT

## 2020-07-27 NOTE — Assessment & Plan Note (Signed)
No signs of pigmentary change from Plaquenil use.  More importantly no signs in the OCT evaluation of the fovea and ophthalmology/retina of outer retinal atrophy signifying early damage from Plaquenil use.

## 2020-07-27 NOTE — Assessment & Plan Note (Signed)
This condition present in the past is completely resolved with restitution and use of CPAP Roughly January 2020

## 2020-07-27 NOTE — Progress Notes (Signed)
07/27/2020     CHIEF COMPLAINT Patient presents for Retina Follow Up (1 yr fu OU/Pt uses Plaquenil for RA/Pt states, "My va seems to be ok. I am not noticing any changes in my va."//A1C: 6.3/LBS: 123)   HISTORY OF PRESENT ILLNESS: Jeffery [name] is a 86 y.o. male who presents to the clinic today for:   HPI    Retina Follow Up    Patient presents with  Other.  In both eyes.  This started 1 year ago.  Severity is mild.  Duration of 1 year.  Since onset it is stable. Additional comments: 1 yr fu OU Pt uses Plaquenil for RA Pt states, "My va seems to be ok. I am not noticing any changes in my va."  A1C: 6.3 LBS: 123       Last edited by Kendra Opitz, COA on 07/27/2020  2:58 PM. (History)      Referring physician: Shon Baton, MD [address] [place],  [place] [id number]  HISTORICAL INFORMATION:   Selected notes from the [place]: No current outpatient medications on file. (Ophthalmic Drugs)   No current facility-administered medications for this visit. (Ophthalmic Drugs)   Current Outpatient Medications (Other)  Medication Sig  . hydroxychloroquine (PLAQUENIL) 200 MG tablet TAKE 1 TABLET(200 MG) BY MOUTH TWICE DAILY  . acetaminophen (TYLENOL) 500 MG tablet Take 500 mg by mouth every 6 (six) hours as needed.  . Calcium Carbonate (CALCIUM 600 PO) Take 600 mg by mouth daily.  . Cholecalciferol (VITAMIN D) 2000 units CAPS Take by mouth daily.  . Cholecalciferol (VITAMIN D3) 1.25 MG (50000 UT) CAPS TAKE 1 CAPSULE BY MOUTH ONCE PER MONTH (Patient taking differently: 3 (three) times a week.)  . Coenzyme Q10 200 MG capsule Take 200 mg by mouth 2 (two) times daily.   . colesevelam (WELCHOL) 625 MG tablet Take 1,250 mg by mouth 3 (three) times daily.   . diclofenac sodium (VOLTAREN) 1 % GEL Apply 2-4 grams to affected joint, up to 4 times daily. (Patient taking differently: Apply 2-4 grams to affected joint, up to 4 times daily as  needed)  . folic acid (FOLVITE) 1 MG tablet TAKE 1 TABLET BY MOUTH ONCE DAILY  . metFORMIN (GLUCOPHAGE) 500 MG tablet Take 500 mg daily by mouth.   . Methotrexate Sodium (METHOTREXATE, PF,) 50 MG/2ML injection Inject 0.6 ml subcutaneously once weekly.  Marland Kitchen omega-3 acid ethyl esters (LOVAZA) 1 g capsule TAKE 1 CAPSULE BY MOUTH THREE TIMES A WEEK  . ONE TOUCH ULTRA TEST test strip TEST BLOOD SUGAR BID AS DIRECTED  . SAFETY-LOK TB SYRINGE 27GX.5" 27G X 1/2" 1 ML MISC USE TO INJECT METHOTREXATE  . vitamin E 100 UNIT capsule Take 100 Units by mouth daily.  Marland Kitchen zolpidem (AMBIEN) 5 MG tablet Take 5 mg by mouth at bedtime.   No current facility-administered medications for this visit. (Other)      REVIEW OF SYSTEMS:    ALLERGIES Allergies  Allergen Reactions  . Sulfasalazine Anaphylaxis  . Demerol [Meperidine] Nausea Only  . Garlic   . Other     shallots  . Sulfa Antibiotics     PAST MEDICAL HISTORY Past Medical History:  Diagnosis Date  . Arthritis   . BCC (basal cell carcinoma)   . BPH (benign prostatic hypertrophy) 2008  . Cancer [facility]) 2008   Prostate  . Cervical stenosis of spine    per patient   .  Complete heart block ([place])   . Complex sleep apnea syndrome    does not use CPAP  . Diabetes mellitus without complication ([place])   . Diverticulosis   . ED (erectile dysfunction)   . GERD (gastroesophageal reflux disease)   . Hemorrhoids   . Hernia, inguinal   . Hyperlipidemia   . IFG (impaired fasting glucose)   . Osteopenia   . Peptic ulcer disease with hemorrhage 1993  . Shingles   . Vitamin D deficiency    Past Surgical History:  Procedure Laterality Date  . BACK SURGERY N/A 1984  . CATARACT EXTRACTION, BILATERAL    . HERNIA REPAIR    . LAMINECTOMY  1984   L 5  . PACEMAKER IMPLANT  12/05/2016   MDT Azure XT DR MRI conditional PPM implanted for CHB by Dr Vista Deck in [place]  . PACEMAKER INSERTION  12/05/2016  . PROSTATE SURGERY  12/2006   Robotic  . TONSILLECTOMY   86 years old  . TRIGGER FINGER RELEASE  03/01/2012   Procedure: MINOR RELEASE TRIGGER FINGER/A-1 PULLEY;  Surgeon: Cammie Sickle., MD;  Location: [place];  Service: Orthopedics;  Laterality: Left;  long finger  . TRIGGER FINGER RELEASE Left 02/08/2018   Procedure: RELEASE TRIGGER FINGER/A-1 PULLEY LEFT RING FINGER;  Surgeon: Daryll Brod, MD;  Location: [place];  Service: Orthopedics;  Laterality: Left;    FAMILY HISTORY Family History  Problem Relation Age of Onset  . Heart failure Mother        Died age 63.  Apparently had valve surgery.   . Congenital heart disease Brother     SOCIAL HISTORY Social History   Tobacco Use  . Smoking status: Never Smoker  . Smokeless tobacco: Never Used  Vaping Use  . Vaping Use: Never used  Substance Use Topics  . Alcohol use: Not Currently  . Drug use: No         OPHTHALMIC EXAM:  Base Eye Exam    Visual Acuity (ETDRS)      Right Left   Dist cc 20/30 20/60 -2   Dist ph cc 20/25 -1 NI       Tonometry (Tonopen, 2:58 PM)      Right Left   Pressure 12 13       Pupils      Pupils Dark Light Shape React APD   Right PERRL 5 4 Round Slow None   Left PERRL 5 4 Round Slow None       Visual Fields      Left Right   Restrictions Partial outer superior temporal, inferior temporal, superior nasal, inferior nasal deficiencies Partial outer superior temporal, inferior temporal, superior nasal, inferior nasal deficiencies       Extraocular Movement      Right Left    Full Full       Neuro/Psych    Oriented x3: Yes   Mood/Affect: Normal       Dilation    Both eyes: 1.0% Mydriacyl, 2.5% Phenylephrine @ 2:58 PM        Slit Lamp and Fundus Exam    External Exam      Right Left   External Normal Normal       Slit Lamp Exam      Right Left   Lids/Lashes Normal Normal   Conjunctiva/Sclera White and quiet White and quiet   Cornea Clear Clear   Anterior Chamber Deep and quiet Deep and  quiet  Iris Round and reactive Round and reactive   Lens Posterior chamber intraocular lens Posterior chamber intraocular lens   Anterior Vitreous Normal Normal       Fundus Exam      Right Left   Posterior Vitreous Posterior vitreous detachment Posterior vitreous detachment   Disc Peripapillary atrophy Normal   C/D Ratio 0.3 0.3   Macula Hard drusen, Retinal pigment epithelial mottling, no macular thickening Hard drusen, Retinal pigment epithelial mottling   Vessels Normal Normal   Periphery Normal Normal          IMAGING AND PROCEDURES  Imaging and Procedures for 07/27/20  OCT, Retina - OU - Both Eyes       Right Eye Quality was good. Scan locations included subfoveal. Central Foveal Thickness: 265. Progression has been stable. Findings include myopic contour.   Left Eye Quality was good. Scan locations included subfoveal. Central Foveal Thickness: 237. Progression has been stable. Findings include myopic contour.   Notes Myopic contours are stable over time, no active CME from his [facility].  Patient continues on CPAP with great success and no further symptomatology.                ASSESSMENT/PLAN:  Retinal telangiectasia of right eye Symptoms have resolved with restitution and use of sent CPAP for his central sleep apnea no recurrences have been detected on OCT  Cystoid macular edema of right eye This condition present in the past is completely resolved with restitution and use of CPAP Roughly January 2020  Long-term use of Plaquenil No signs of pigmentary change from Plaquenil use.  More importantly no signs in the OCT evaluation of the fovea and ophthalmology/retina of outer retinal atrophy signifying early damage from Plaquenil use.      ICD-10-CM   1. Retinal telangiectasia of right eye  H35.071   2. Cystoid macular edema of right eye  H35.351   3. Round hole of right eye  H33.321 OCT, Retina - OU - Both Eyes  4. Long-term use of Plaquenil  Z79.899      1.  No signs of macular pathology OU today we will continue to monitor and observe  2.  3.  Ophthalmic Meds Ordered this visit:  No orders of the defined types were placed in this encounter.      Return in about 9 months (around 04/28/2021) for COLOR FP, DILATE OU.  There are no Patient Instructions on file for this visit.   Explained the diagnoses, plan, and follow up with the patient and they expressed understanding.  Patient expressed understanding of the importance of proper follow up care.   Clent Demark Yuette Putnam M.D. Diseases & Surgery of the Retina and Vitreous Retina [facility] Diabetic [place] 07/27/20     Abbreviations: M myopia (nearsighted); A astigmatism; H hyperopia (farsighted); P presbyopia; Mrx spectacle prescription;  CTL contact lenses; OD right eye; OS left eye; OU both eyes  XT exotropia; ET esotropia; PEK punctate epithelial keratitis; PEE punctate epithelial erosions; DES dry eye syndrome; MGD meibomian gland dysfunction; ATs artificial tears; PFAT's preservative free artificial tears; [place] nuclear sclerotic cataract; PSC posterior subcapsular cataract; ERM epi-retinal membrane; PVD posterior vitreous detachment; RD retinal detachment; DM diabetes mellitus; DR diabetic retinopathy; NPDR non-proliferative diabetic retinopathy; PDR proliferative diabetic retinopathy; CSME clinically significant macular edema; DME diabetic macular edema; dbh dot blot hemorrhages; CWS cotton wool spot; POAG primary open angle glaucoma; C/D cup-to-disc ratio; HVF humphrey visual field; GVF goldmann visual field; OCT optical coherence tomography; IOP intraocular  pressure; BRVO Branch retinal vein occlusion; CRVO central retinal vein occlusion; CRAO central retinal artery occlusion; BRAO branch retinal artery occlusion; RT retinal tear; SB scleral buckle; PPV pars plana vitrectomy; VH Vitreous hemorrhage; PRP panretinal laser photocoagulation; IVK intravitreal kenalog; VMT vitreomacular traction;  MH Macular hole;  NVD neovascularization of the disc; NVE neovascularization elsewhere; AREDS age related eye disease study; ARMD age related macular degeneration; POAG primary open angle glaucoma; EBMD epithelial/anterior basement membrane dystrophy; ACIOL anterior chamber intraocular lens; IOL intraocular lens; PCIOL posterior chamber intraocular lens; Phaco/IOL phacoemulsification with intraocular lens placement; [place] photorefractive keratectomy; LASIK laser assisted in situ keratomileusis; HTN hypertension; DM diabetes mellitus; COPD chronic obstructive pulmonary disease

## 2020-07-29 NOTE — Progress Notes (Signed)
Office Visit Note  Patient: Jeffery [name]             Date of Birth: 1934/05/16           MRN: [id number]             PCP: Shon Baton, MD Referring: Shon Baton, MD Visit Date: 08/12/2020 Occupation: _0 @  Subjective:  Medication monitoring.   History of Present Illness: Jeffery [name] is a 86 y.o. male with a history of rheumatoid arthritis, osteoarthritis, and degenerative disc disease.  He states that he had increased lower back pain due to spinal stenosis recently.  He was seen by [facility] where he had lumbar spine injections.  He had good response to the injections.  He has some generalized pain from underlying degenerative disc disease.  He has not had any joint swelling.  He had been doing well on the combination of methotrexate and Plaquenil.  He had been tolerating medications well and had pain been active.  He denies any muscle weakness or tenderness.  Activities of Daily Living:  Patient reports morning stiffness for 30-60 minutes.   Patient Reports nocturnal pain.  Difficulty dressing/grooming: Denies Difficulty climbing stairs: Denies Difficulty getting out of chair: Denies Difficulty using hands for taps, buttons, cutlery, and/or writing: Denies  Review of Systems  Constitutional: Positive for fatigue. Negative for night sweats.  HENT: Positive for mouth dryness. Negative for mouth sores and nose dryness.   Eyes: Negative for pain, redness, itching, visual disturbance and dryness.  Respiratory: Negative for cough, hemoptysis, shortness of breath and difficulty breathing.   Cardiovascular: Negative for chest pain, palpitations, hypertension, irregular heartbeat and swelling in legs/feet.  Gastrointestinal: Negative for abdominal pain, blood in stool, constipation and diarrhea.  Endocrine: Negative for increased urination.  Genitourinary: Negative for painful urination.  Musculoskeletal: Positive for arthralgias, joint pain, myalgias,  morning stiffness and myalgias. Negative for joint swelling, muscle weakness and muscle tenderness.  Skin: Negative for color change, rash, hair loss, nodules/bumps, redness, skin tightness, ulcers and sensitivity to sunlight.  Allergic/Immunologic: Negative for susceptible to infections.  Neurological: Positive for weakness. Negative for dizziness, fainting, numbness, headaches, memory loss and night sweats.  Hematological: Negative for swollen glands.  Psychiatric/Behavioral: Negative for depressed mood, confusion and sleep disturbance. The patient is not nervous/anxious.     PMFS History:  Patient Active Problem List   Diagnosis Date Noted  . Sleep apnea 06/28/2020  . Dyslipidemia 06/28/2020  . Memory changes 04/16/2020  . Pacemaker ECG pattern 09/20/2019  . Treatment-emergent central sleep apnea 09/20/2019  . Round hole of right eye 08/26/2019  . 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 Cystoid macular edema of right eye 08/26/2019  . Pseudophakia of both eyes 08/26/2019  . Obstructive sleep apnea of adult 08/26/2019  . 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) 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 08/24/2019  . Educated about COVID-19 virus infection 10/08/2018  . Aortic root dilatation ([place]) 10/08/2018  . Mitral annular calcification 10/08/2018  . Neuropathy 08/21/2017  . Hydroxychloroquine causing adverse effect in therapeutic use 08/21/2017  . Symptomatic bradycardia 02/23/2017  . Status cardiac pacemaker 02/23/2017  . CSA (central sleep apnea) 02/23/2017  . Heart block AV complete ([place]) 01/18/2017  . Vitamin D deficiency 02/08/2016  . Trigger finger, left middle finger 02/08/2016  . Long-term use of Plaquenil 02/08/2016  . Bursitis of right shoulder 02/08/2016  . DDD (degenerative disc disease), lumbar 02/08/2016  . DDD (degenerative disc disease),  thoracic 02/08/2016  . Rheumatoid arthritis of multiple sites without rheumatoid factor ([place])  11/28/2015    Class: Chronic  . Other secondary osteoarthritis of both hands 11/28/2015    Class: Chronic  . PMR (polymyalgia rheumatica) (HCC) 11/28/2015    Class: Chronic  . Osteoporosis 11/28/2015    Class: Chronic  . Prostate cancer ([place]) 11/28/2015    Class: History of  . Duodenal ulcer 11/28/2015    Class: Chronic  . High cholesterol 11/28/2015  . Dizziness 07/16/2015  . Incomplete RBBB 03/31/2015  . Sleep apnea, primary central 03/03/2015  . Polyneuropathy ([place]) 01/24/2013  . Right inguinal hernia 07/05/2012    Past Medical History:  Diagnosis Date  . Arthritis   . BCC (basal cell carcinoma)   . BPH (benign prostatic hypertrophy) 2008  . Cancer [facility]) 2008   Prostate  . Cervical stenosis of spine    per patient   . Complete heart block ([place])   . Complex sleep apnea syndrome    does not use CPAP  . Diabetes mellitus without complication ([place])   . Diverticulosis   . ED (erectile dysfunction)   . GERD (gastroesophageal reflux disease)   . Hemorrhoids   . Hernia, inguinal   . Hyperlipidemia   . IFG (impaired fasting glucose)   . Osteopenia   . Peptic ulcer disease with hemorrhage 1993  . Shingles   . Vitamin D deficiency     Family History  Problem Relation Age of Onset  . Heart failure Mother        Died age 35.  Apparently had valve surgery.   . Congenital heart disease Brother    Past Surgical History:  Procedure Laterality Date  . BACK SURGERY N/A 1984  . CATARACT EXTRACTION, BILATERAL    . HERNIA REPAIR    . LAMINECTOMY  1984   L 5  . PACEMAKER IMPLANT  12/05/2016   MDT Azure XT DR MRI conditional PPM implanted for CHB by Dr Vista Deck in [place]  . PACEMAKER INSERTION  12/05/2016  . PROSTATE SURGERY  12/2006   Robotic  . TONSILLECTOMY  86 years old  . TRIGGER FINGER RELEASE  03/01/2012   Procedure: MINOR RELEASE TRIGGER FINGER/A-1 PULLEY;  Surgeon: Cammie Sickle., MD;  Location: [place];  Service: Orthopedics;  Laterality: Left;   long finger  . TRIGGER FINGER RELEASE Left 02/08/2018   Procedure: RELEASE TRIGGER FINGER/A-1 PULLEY LEFT RING FINGER;  Surgeon: Daryll Brod, MD;  Location: [place];  Service: Orthopedics;  Laterality: Left;   Social History   Social History Narrative   Right handed, Retired .Married, 2 kids. Caffeine none.   3 Step    Immunization History  Administered Date(s) Administered  . Influenza, High Dose Seasonal PF 03/03/2017, 01/26/2018  . Moderna Sars-Covid-2 Vaccination 04/13/2019, 05/14/2019, 12/12/2019     Objective: Vital Signs: BP 104/60 (BP Location: Left Arm, Patient Position: Sitting, Cuff Size: Normal)   Pulse 97   Ht _0  (1.88 m)   Wt 163 lb 12.8 oz (74.3 kg)   BMI 21.03 kg/m    Physical Exam Vitals and nursing note reviewed.  Constitutional:      Appearance: He is well-developed.  HENT:     Head: Normocephalic and atraumatic.  Eyes:     Conjunctiva/sclera: Conjunctivae normal.     Pupils: Pupils are equal, round, and reactive to light.  Cardiovascular:     Rate and Rhythm: Normal rate and regular rhythm.  Heart sounds: Normal heart sounds.  Pulmonary:     Effort: Pulmonary effort is normal.     Breath sounds: Normal breath sounds.  Abdominal:     General: Bowel sounds are normal.     Palpations: Abdomen is soft.  Musculoskeletal:     Cervical back: Normal range of motion and neck supple.  Skin:    General: Skin is warm and dry.     Capillary Refill: Capillary refill takes less than 2 seconds.  Neurological:     Mental Status: He is alert and oriented to person, place, and time.  Psychiatric:        Behavior: Behavior normal.      Musculoskeletal Exam: He has some limitation with range of motion of cervical thoracic and lumbar spine.  Shoulder joints, elbow joints, wrist joints were in good range of motion.  He had bilateral PIP DIP and CMC thickening without synovitis.  He has incomplete extension of his right third PIP joint.  Hip  joints, knee joints with good range of motion.  He had no tenderness over ankles or MTPs.  CDAI Exam: CDAI Score: 0.2  Patient Global: 1 mm; Provider Global: 1 mm Swollen: 0 ; Tender: 0  Joint Exam 08/12/2020   No joint exam has been documented for this visit   There is currently no information documented on the homunculus. Go to the Rheumatology activity and complete the homunculus joint exam.  Investigation: No additional findings.  Imaging: OCT, Retina - OU - Both Eyes  Result Date: 07/27/2020 Right Eye Quality was good. Scan locations included subfoveal. Central Foveal Thickness: 265. Progression has been stable. Findings include myopic contour. Left Eye Quality was good. Scan locations included subfoveal. Central Foveal Thickness: 237. Progression has been stable. Findings include myopic contour. Notes Myopic contours are stable over time, no active CME from his [facility].  Patient continues on CPAP with great success and no further symptomatology.   Recent Labs: Lab Results  Component Value Date   WBC 4.8 08/10/2020   HGB 14.9 08/10/2020   PLT 237 08/10/2020   NA 141 08/10/2020   K 4.8 08/10/2020   CL 99 08/10/2020   CO2 34 (H) 08/10/2020   GLUCOSE 206 (H) 08/10/2020   BUN 17 08/10/2020   CREATININE 0.69 (L) 08/10/2020   BILITOT 0.6 08/10/2020   ALKPHOS 85 03/19/2019   AST 8 (L) 08/10/2020   ALT 16 08/10/2020   PROT 6.8 08/10/2020   ALBUMIN 4.7 (H) 03/19/2019   CALCIUM 9.8 08/10/2020   GFRAA 102 08/10/2020    Speciality Comments: PLQ Eye Exam: 09/12/2019 WNL @ [place] up in 1 year  Procedures:  No procedures performed Allergies: Sulfasalazine, Demerol [meperidine], Garlic, Other, and Sulfa antibiotics   Assessment / Plan:     Visit Diagnoses: Rheumatoid arthritis of multiple sites without rheumatoid factor (HCC) - RF negative, CCP negative, 1433 eta negative, ANA negative.  He is clinically doing much better on methotrexate and Plaquenil  combination.  He had no synovitis on my examination.  High risk medication use - MTX 0.6 ml sq once weekly, folic acid 1 mg po daily, PLQ 200 mg BID. PLQ Eye Exam: 09/12/2019.  I reviewed labs from Aug 10, 2020 which were within normal limits.  He will get labs every 3 months to monitor for drug toxicity.  He has been advised to discontinue methotrexate in case he develops an infection and restart methotrexate further infection resolves.  A fourth dose of COVID-19 vaccine  was also discussed and the instructions were placed in the AVS.  According the patient he is up-to-date on pneumococcal and Shingrix vaccine.  Primary osteoarthritis of both hands-joint protection muscle strengthening was discussed.  PMR (polymyalgia rheumatica) (HCC)-he had no muscular weakness or tenderness.  Osteopenia of multiple sites - 03/21 T -1.9.  He is taking calcium and vitamin D and has been off bisphosphonates.  History of vitamin D deficiency-he is on vitamin D supplement.  DDD (degenerative disc disease), cervical-he had limited range of motion without discomfort.  DDD (degenerative disc disease), thoracic-chronic pain.  DDD (degenerative disc disease), lumbar - with spinal stenosis. He had recent injection at [facility].  He is doing better after the cortisone injection.  Other fatigue-he continues to have some chronic fatigue.  History of prostate cancer  History of sleep apnea  History of diabetes mellitus  Heart block AV complete ([place])  COVID-19 virus infection - January 2022.  Patient states that his test was positive, he had fatigue for couple of days but did not get any other symptoms.  Orders: No orders of the defined types were placed in this encounter.  No orders of the defined types were placed in this encounter.   Follow-Up Instructions: Return in about 5 months (around 01/12/2021) for Rheumatoid arthritis, Osteoarthritis.   Bo Merino, MD  Note - This record has been created  using Editor, commissioning.  Chart creation errors have been sought, but may not always  have been located. Such creation errors do not reflect on  the standard of medical care.

## 2020-08-08 ENCOUNTER — Other Ambulatory Visit: Payer: Self-pay | Admitting: Physician Assistant

## 2020-08-08 DIAGNOSIS — Z79899 Other long term (current) drug therapy: Secondary | ICD-10-CM

## 2020-08-10 ENCOUNTER — Other Ambulatory Visit: Payer: Self-pay

## 2020-08-10 DIAGNOSIS — Z79899 Other long term (current) drug therapy: Secondary | ICD-10-CM

## 2020-08-10 NOTE — Telephone Encounter (Signed)
Next Visit: 08/12/2020  Last Visit: 02/11/2020  Last Fill: 05/18/2020  DX:  Rheumatoid arthritis of multiple sites without rheumatoid factor   Current Dose per office note 02/11/2020, MTX 0.6 ml sq once weekly  Labs: 05/13/2020, CBC WNL. Glucose was 116. Creatinine is borderline low but stable. Rest of CMP was stable. We will continue to monitor.   Okay to refill MTX?

## 2020-08-11 LAB — COMPLETE METABOLIC PANEL WITH GFR
AG Ratio: 1.7 (calc) (ref 1.0–2.5)
ALT: 16 U/L (ref 9–46)
AST: 8 U/L — ABNORMAL LOW (ref 10–35)
Albumin: 4.3 g/dL (ref 3.6–5.1)
Alkaline phosphatase (APISO): 68 U/L (ref 35–144)
BUN/Creatinine Ratio: 25 (calc) — ABNORMAL HIGH (ref 6–22)
BUN: 17 mg/dL (ref 7–25)
CO2: 34 mmol/L — ABNORMAL HIGH (ref 20–32)
Calcium: 9.8 mg/dL (ref 8.6–10.3)
Chloride: 99 mmol/L (ref 98–110)
Creat: 0.69 mg/dL — ABNORMAL LOW (ref 0.70–1.11)
GFR, Est African American: 102 mL/min/{1.73_m2} (ref >=60)
GFR, Est Non African American: 88 mL/min/{1.73_m2} (ref >=60)
Globulin: 2.5 g/dL (calc) (ref 1.9–3.7)
Glucose, Bld: 206 mg/dL — ABNORMAL HIGH (ref 65–99)
Potassium: 4.8 mmol/L (ref 3.5–5.3)
Sodium: 141 mmol/L (ref 135–146)
Total Bilirubin: 0.6 mg/dL (ref 0.2–1.2)
Total Protein: 6.8 g/dL (ref 6.1–8.1)

## 2020-08-11 LAB — CBC WITH DIFFERENTIAL/PLATELET
Absolute Monocytes: 413 cells/uL (ref 200–950)
Basophils Absolute: 10 cells/uL (ref 0–200)
Basophils Relative: 0.2 %
Eosinophils Absolute: 101 cells/uL (ref 15–500)
Eosinophils Relative: 2.1 %
HCT: 44.5 % (ref 38.5–50.0)
Hemoglobin: 14.9 g/dL (ref 13.2–17.1)
Lymphs Abs: 1229 cells/uL (ref 850–3900)
MCH: 31.5 pg (ref 27.0–33.0)
MCHC: 33.5 g/dL (ref 32.0–36.0)
MCV: 94.1 fL (ref 80.0–100.0)
MPV: 9.5 fL (ref 7.5–12.5)
Monocytes Relative: 8.6 %
Neutro Abs: 3048 cells/uL (ref 1500–7800)
Neutrophils Relative %: 63.5 %
Platelets: 237 10*3/uL (ref 140–400)
RBC: 4.73 10*6/uL (ref 4.20–5.80)
RDW: 12.9 % (ref 11.0–15.0)
Total Lymphocyte: 25.6 %
WBC: 4.8 10*3/uL (ref 3.8–10.8)

## 2020-08-11 NOTE — Progress Notes (Signed)
CBC is normal, glucose is elevated.

## 2020-08-12 ENCOUNTER — Encounter: Payer: Self-pay | Admitting: Rheumatology

## 2020-08-12 ENCOUNTER — Ambulatory Visit (INDEPENDENT_AMBULATORY_CARE_PROVIDER_SITE_OTHER): Payer: Medicare Other | Admitting: Rheumatology

## 2020-08-12 ENCOUNTER — Other Ambulatory Visit: Payer: Self-pay

## 2020-08-12 VITALS — BP 104/60 | HR 97 | Ht 74.0 in | Wt 163.8 lb

## 2020-08-12 DIAGNOSIS — M353 Polymyalgia rheumatica: Secondary | ICD-10-CM | POA: Diagnosis not present

## 2020-08-12 DIAGNOSIS — Z79899 Other long term (current) drug therapy: Secondary | ICD-10-CM | POA: Diagnosis not present

## 2020-08-12 DIAGNOSIS — U071 COVID-19: Secondary | ICD-10-CM

## 2020-08-12 DIAGNOSIS — M19042 Primary osteoarthritis, left hand: Secondary | ICD-10-CM

## 2020-08-12 DIAGNOSIS — Z8639 Personal history of other endocrine, nutritional and metabolic disease: Secondary | ICD-10-CM

## 2020-08-12 DIAGNOSIS — M19041 Primary osteoarthritis, right hand: Secondary | ICD-10-CM | POA: Diagnosis not present

## 2020-08-12 DIAGNOSIS — Z8669 Personal history of other diseases of the nervous system and sense organs: Secondary | ICD-10-CM

## 2020-08-12 DIAGNOSIS — M5030 Other cervical disc degeneration, unspecified cervical region: Secondary | ICD-10-CM

## 2020-08-12 DIAGNOSIS — R5383 Other fatigue: Secondary | ICD-10-CM

## 2020-08-12 DIAGNOSIS — M5136 Other intervertebral disc degeneration, lumbar region: Secondary | ICD-10-CM

## 2020-08-12 DIAGNOSIS — M0609 Rheumatoid arthritis without rheumatoid factor, multiple sites: Secondary | ICD-10-CM

## 2020-08-12 DIAGNOSIS — M8589 Other specified disorders of bone density and structure, multiple sites: Secondary | ICD-10-CM

## 2020-08-12 DIAGNOSIS — Z8546 Personal history of malignant neoplasm of prostate: Secondary | ICD-10-CM

## 2020-08-12 DIAGNOSIS — I442 Atrioventricular block, complete: Secondary | ICD-10-CM

## 2020-08-12 DIAGNOSIS — M5134 Other intervertebral disc degeneration, thoracic region: Secondary | ICD-10-CM

## 2020-08-12 NOTE — Patient Instructions (Addendum)
Standing Labs We placed an order today for your standing lab work.   Please have your standing labs drawn in August and every 3 months  If possible, please have your labs drawn 2 weeks prior to your appointment so that the provider can discuss your results at your appointment.  We have open lab daily Monday through Thursday from 1:30-4:30 PM and Friday from 1:30-4:00 PM at the office of Dr. Bo Merino, [facility].   Please be advised, all patients with office appointments requiring lab work will take precedents over walk-in lab work.  If possible, please come for your lab work on Monday and Friday afternoons, as you may experience shorter wait times. The office is located at [address], [place], [place], [place] [address] No appointment is necessary.   Labs are drawn by [facility]. Please bring your co-pay at the time of your lab draw.  You may receive a bill from [facility] for your lab work.  If you wish to have your labs drawn at another location, please call the office 24 hours in advance to send orders.  If you have any questions regarding directions or hours of operation,  please call [phone].   As a reminder, please drink plenty of water prior to coming for your lab work. Thanks!  COVID-19 vaccine recommendations:   COVID-19 vaccine is recommended for everyone (unless you are allergic to a vaccine component), even if you are on a medication that suppresses your immune system.   If you are on Methotrexate, Cellcept (mycophenolate), Rinvoq, Morrie Sheldon, and Olumiant- hold the medication for 1 week after each vaccine. Hold Methotrexate for 2 weeks after the single dose COVID-19 vaccine.    Do not take Tylenol or any anti-inflammatory medications (NSAIDs) 24 hours prior to the COVID-19 vaccination.   There is no direct evidence about the efficacy of the COVID-19 vaccine in individuals who are on medications that suppress the immune system.   Even if you are fully  vaccinated, and you are on any medications that suppress your immune system, please continue to wear a mask, maintain at least six feet social distance and practice hand hygiene.   If you develop a COVID-19 infection, please contact your PCP or our office to determine if you need monoclonal antibody infusion.  The booster vaccine is now available for immunocompromised patients.   Please see the following web sites for updated information.   [email or web address]-[email or web address]  Heart Disease Prevention   Your inflammatory disease increases your risk of heart disease which includes heart attack, stroke, atrial fibrillation (irregular heartbeats), high blood pressure, heart failure and atherosclerosis (plaque in the arteries).  It is important to reduce your risk by:   . Keep blood pressure, cholesterol, and blood sugar at healthy levels   . Smoking Cessation   . Maintain a healthy weight  o BMI 20-25   . Eat a healthy diet  o Plenty of fresh fruit, vegetables, and whole grains  o Limit saturated fats, foods high in sodium, and added sugars  o DASH and Mediterranean diet   . Increase physical activity  o Recommend moderate physically activity for 150 minutes per week/ 30 minutes a day for five days a week These can be broken up into three separate ten-minute sessions during the day.   . Reduce Stress  . Meditation, slow breathing exercises, yoga, coloring books  . Dental visits twice a year

## 2020-08-24 ENCOUNTER — Encounter (INDEPENDENT_AMBULATORY_CARE_PROVIDER_SITE_OTHER): Payer: Medicare Other | Admitting: Ophthalmology

## 2020-09-15 ENCOUNTER — Ambulatory Visit (INDEPENDENT_AMBULATORY_CARE_PROVIDER_SITE_OTHER): Payer: Medicare Other

## 2020-09-15 DIAGNOSIS — I442 Atrioventricular block, complete: Secondary | ICD-10-CM | POA: Diagnosis not present

## 2020-09-15 LAB — CUP PACEART REMOTE DEVICE CHECK
Battery Remaining Longevity: 92 mo
Battery Voltage: 2.99 V
Brady Statistic AP VP Percent: 0.79 %
Brady Statistic AP VS Percent: 0 %
Brady Statistic AS VP Percent: 99.1 %
Brady Statistic AS VS Percent: 0.11 %
Brady Statistic RA Percent Paced: 0.79 %
Brady Statistic RV Percent Paced: 99.89 %
Date Time Interrogation Session: 20220606222246
Implantable Lead Implant Date: 20180827
Implantable Lead Implant Date: 20180827
Implantable Lead Location: 753859
Implantable Lead Location: 753860
Implantable Lead Model: 4076
Implantable Lead Model: 4076
Implantable Pulse Generator Implant Date: 20180827
Lead Channel Impedance Value: 323 Ohm
Lead Channel Impedance Value: 399 Ohm
Lead Channel Impedance Value: 418 Ohm
Lead Channel Impedance Value: 475 Ohm
Lead Channel Pacing Threshold Amplitude: 0.625 V
Lead Channel Pacing Threshold Amplitude: 0.875 V
Lead Channel Pacing Threshold Pulse Width: 0.4 ms
Lead Channel Pacing Threshold Pulse Width: 0.4 ms
Lead Channel Sensing Intrinsic Amplitude: 23.375 mV
Lead Channel Sensing Intrinsic Amplitude: 23.375 mV
Lead Channel Sensing Intrinsic Amplitude: 5.5 mV
Lead Channel Sensing Intrinsic Amplitude: 5.5 mV
Lead Channel Setting Pacing Amplitude: 1.5 V
Lead Channel Setting Pacing Amplitude: 2.5 V
Lead Channel Setting Pacing Pulse Width: 0.4 ms
Lead Channel Setting Sensing Sensitivity: 4 mV

## 2020-10-07 NOTE — Progress Notes (Signed)
Remote pacemaker transmission.   

## 2020-10-15 ENCOUNTER — Other Ambulatory Visit (HOSPITAL_BASED_OUTPATIENT_CLINIC_OR_DEPARTMENT_OTHER): Payer: Self-pay

## 2020-10-15 ENCOUNTER — Ambulatory Visit: Payer: Medicare Other | Attending: Internal Medicine

## 2020-10-15 ENCOUNTER — Other Ambulatory Visit: Payer: Self-pay

## 2020-10-15 DIAGNOSIS — Z23 Encounter for immunization: Secondary | ICD-10-CM

## 2020-10-15 MED ORDER — COVID-19 MRNA VACC (MODERNA) 100 MCG/0.5ML IM SUSP
INTRAMUSCULAR | 0 refills | Status: DC
  Filled 2020-10-15: qty 0.25, 1d supply, fill #0

## 2020-10-15 NOTE — Progress Notes (Signed)
   Covid-19 Vaccination Clinic  Name:  Jeffery [name]    MRN: [id number] DOB: 09/23/1934  10/15/2020  [name] was observed post Covid-19 immunization for 15 minutes without incident. He was provided with Vaccine Information Sheet and instruction to access the V-Safe system.   [name] was instructed to call 911 with any severe reactions post vaccine: Difficulty breathing  Swelling of face and throat  A fast heartbeat  A bad rash all over body  Dizziness and weakness   Immunizations Administered     Name Date Dose VIS Date Route   Moderna Covid-19 Booster Vaccine 10/15/2020 12:27 PM 0.25 mL 01/29/2020 Intramuscular   Manufacturer: Moderna   Lot: [id number]   [place]: [id number]

## 2020-10-20 ENCOUNTER — Ambulatory Visit: Payer: Medicare Other | Admitting: Neurology

## 2020-10-24 ENCOUNTER — Other Ambulatory Visit: Payer: Self-pay | Admitting: Rheumatology

## 2020-10-26 NOTE — Telephone Encounter (Signed)
Next Visit: 02/25/2021  Last Visit: 08/12/2020  Last Fill: 09/17/2019  Dx: Rheumatoid arthritis of multiple sites without rheumatoid factor  Current Dose per office note on 06/17/2503: folic acid 1 mg po daily  Okay to refill folic acid?

## 2020-11-14 ENCOUNTER — Other Ambulatory Visit: Payer: Self-pay | Admitting: Physician Assistant

## 2020-11-14 DIAGNOSIS — M0609 Rheumatoid arthritis without rheumatoid factor, multiple sites: Secondary | ICD-10-CM

## 2020-11-16 ENCOUNTER — Other Ambulatory Visit: Payer: Self-pay | Admitting: *Deleted

## 2020-11-16 DIAGNOSIS — Z79899 Other long term (current) drug therapy: Secondary | ICD-10-CM

## 2020-11-16 MED ORDER — "TUBERCULIN SYRINGE 27G X 1/2"" 1 ML MISC": 3 refills | Status: DC

## 2020-11-16 NOTE — Telephone Encounter (Signed)
Last Visit: 08/12/2020  Next Visit: 02/25/2021  Labs: 08/10/2020 CBC is normal, glucose is elevated.  Eye exam: 09/12/2019 WNL    Current Dose per office note 08/12/2020: PLQ 200 mg BID.  [id number] arthritis of multiple sites without rheumatoid factor  Last Fill: 07/13/2020  Patient advised he is due to update his PLQ eye exam. Patient states he has updated and will call the eye doctor and have results sent.   Okay to refill Plaquenil?

## 2020-11-16 NOTE — Telephone Encounter (Signed)
Next Visit: 02/25/2021  Last Visit: 08/12/2020  Last Fill: 10/10/2019  Dx: Rheumatoid arthritis of multiple sites without rheumatoid factor  Current Dose per office note on 08/12/2020: MTX 0.6 ml sq once weekly  Okay to refill Syringes?

## 2020-12-15 ENCOUNTER — Ambulatory Visit (INDEPENDENT_AMBULATORY_CARE_PROVIDER_SITE_OTHER): Payer: Medicare Other

## 2020-12-15 DIAGNOSIS — I442 Atrioventricular block, complete: Secondary | ICD-10-CM | POA: Diagnosis not present

## 2020-12-15 LAB — CUP PACEART REMOTE DEVICE CHECK
Battery Remaining Longevity: 89 mo
Battery Voltage: 2.99 V
Brady Statistic AP VP Percent: 2.47 %
Brady Statistic AP VS Percent: 0 %
Brady Statistic AS VP Percent: 97.5 %
Brady Statistic AS VS Percent: 0.03 %
Brady Statistic RA Percent Paced: 2.47 %
Brady Statistic RV Percent Paced: 99.97 %
Date Time Interrogation Session: 20220905225501
Implantable Lead Implant Date: 20180827
Implantable Lead Implant Date: 20180827
Implantable Lead Location: 753859
Implantable Lead Location: 753860
Implantable Lead Model: 4076
Implantable Lead Model: 4076
Implantable Pulse Generator Implant Date: 20180827
Lead Channel Impedance Value: 323 Ohm
Lead Channel Impedance Value: 380 Ohm
Lead Channel Impedance Value: 380 Ohm
Lead Channel Impedance Value: 456 Ohm
Lead Channel Pacing Threshold Amplitude: 0.625 V
Lead Channel Pacing Threshold Amplitude: 0.875 V
Lead Channel Pacing Threshold Pulse Width: 0.4 ms
Lead Channel Pacing Threshold Pulse Width: 0.4 ms
Lead Channel Sensing Intrinsic Amplitude: 23.375 mV
Lead Channel Sensing Intrinsic Amplitude: 23.375 mV
Lead Channel Sensing Intrinsic Amplitude: 4.875 mV
Lead Channel Sensing Intrinsic Amplitude: 4.875 mV
Lead Channel Setting Pacing Amplitude: 1.5 V
Lead Channel Setting Pacing Amplitude: 2.5 V
Lead Channel Setting Pacing Pulse Width: 0.4 ms
Lead Channel Setting Sensing Sensitivity: 4 mV

## 2020-12-24 ENCOUNTER — Ambulatory Visit: Payer: Medicare Other | Admitting: Neurology

## 2020-12-24 NOTE — Progress Notes (Signed)
Remote pacemaker transmission.   

## 2020-12-29 ENCOUNTER — Other Ambulatory Visit: Payer: Self-pay | Admitting: Rheumatology

## 2020-12-29 NOTE — Telephone Encounter (Signed)
Last Visit: 08/12/2020   Next Visit: 02/25/2021  Dx: History of vitamin D deficiency  Current Dose per office note 08/12/2020: he is on vitamin D supplement.  Okay to refill Vitamin D?

## 2021-01-12 ENCOUNTER — Ambulatory Visit: Payer: Medicare Other | Admitting: Neurology

## 2021-02-11 NOTE — Progress Notes (Deleted)
Office Visit Note  Patient: Jeffery [name]             Date of Birth: 1934-12-21           MRN: [id number]             PCP: Shon Baton, MD Referring: Shon Baton, MD Visit Date: 02/25/2021 Occupation: @GUAROCC @  Subjective:  No chief complaint on file.   History of Present Illness: Jeffery [name] is a 86 y.o. male ***   Activities of Daily Living:  Patient reports morning stiffness for *** {minute/hour:19697}.   Patient {ACTIONS;DENIES/REPORTS:21021675::"Denies"} nocturnal pain.  Difficulty dressing/grooming: {ACTIONS;DENIES/REPORTS:21021675::"Denies"} Difficulty climbing stairs: {ACTIONS;DENIES/REPORTS:21021675::"Denies"} Difficulty getting out of chair: {ACTIONS;DENIES/REPORTS:21021675::"Denies"} Difficulty using hands for taps, buttons, cutlery, and/or writing: {ACTIONS;DENIES/REPORTS:21021675::"Denies"}  No Rheumatology ROS completed.   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   Cystoid macular edema of right eye 08/26/2019   Pseudophakia of both eyes 08/26/2019   Obstructive sleep apnea of adult 08/26/2019   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) 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 08/24/2019   Educated about COVID-19 virus infection 10/08/2018   Aortic root dilatation ([place]) 10/08/2018   Mitral annular calcification 10/08/2018   Neuropathy 08/21/2017   Hydroxychloroquine causing adverse effect in therapeutic use 08/21/2017   Symptomatic bradycardia 02/23/2017   Status cardiac pacemaker 02/23/2017   CSA (central sleep apnea) 02/23/2017   Heart block AV complete ([place]) 01/18/2017   Vitamin D deficiency 02/08/2016   Trigger finger, left middle finger 02/08/2016    Long-term use of Plaquenil 02/08/2016   Bursitis of right shoulder 02/08/2016   DDD (degenerative disc disease), lumbar 02/08/2016   DDD (degenerative disc disease), thoracic 02/08/2016   Rheumatoid arthritis of multiple sites without rheumatoid factor ([place]) 11/28/2015    Class: Chronic   Other secondary osteoarthritis of both hands 11/28/2015    Class: Chronic   PMR (polymyalgia rheumatica) ([place]) 11/28/2015    Class: Chronic   Osteoporosis 11/28/2015    Class: Chronic   Prostate cancer ([place]) 11/28/2015    Class: History of   Duodenal ulcer 11/28/2015    Class: Chronic   High cholesterol 11/28/2015   Dizziness 07/16/2015   Incomplete RBBB 03/31/2015   Sleep apnea, primary central 03/03/2015   Polyneuropathy ([place]) 01/24/2013   Right inguinal hernia 07/05/2012    Past Medical History:  Diagnosis Date   Arthritis    BCC (basal cell carcinoma)    BPH (benign prostatic hypertrophy) 2008   Cancer [facility]) 2008   Prostate   Cervical stenosis of spine    per patient    Complete heart block (HCC)    Complex sleep apnea syndrome    does not use CPAP   Diabetes mellitus without complication ([place])    Diverticulosis    ED (erectile dysfunction)    GERD (gastroesophageal reflux disease)    Hemorrhoids    Hernia, inguinal    Hyperlipidemia    IFG (impaired fasting glucose)    Osteopenia    Peptic ulcer disease with hemorrhage 1993   Shingles    Vitamin D deficiency     Family History  Problem Relation Age of Onset   Heart failure Mother  Died age 78.  Apparently had valve surgery.    Congenital heart disease Brother    Past Surgical History:  Procedure Laterality Date   BACK SURGERY N/A 1984   CATARACT EXTRACTION, BILATERAL     HERNIA REPAIR     LAMINECTOMY  1984   L 5   PACEMAKER IMPLANT  12/05/2016   MDT Azure XT DR MRI conditional PPM implanted for CHB by Dr Vista Deck in [place]  12/05/2016   PROSTATE SURGERY  12/2006   Robotic    TONSILLECTOMY  86 years old   [place]  03/01/2012   Procedure: MINOR RELEASE TRIGGER FINGER/A-1 PULLEY;  Surgeon: Cammie Sickle., MD;  Location: [place];  Service: Orthopedics;  Laterality: Left;  long finger   TRIGGER FINGER RELEASE Left 02/08/2018   Procedure: RELEASE TRIGGER FINGER/A-1 PULLEY LEFT RING FINGER;  Surgeon: Daryll Brod, MD;  Location: [facility];  Service: Orthopedics;  Laterality: Left;   Social History   Social History Narrative   Right handed, Retired .Married, 2 kids. Caffeine none.   3 Step    Immunization History  Administered Date(s) Administered   Influenza, High Dose Seasonal PF 03/03/2017, 01/26/2018   Moderna SARS-COV2 Booster Vaccination 10/15/2020   Moderna Sars-Covid-2 Vaccination 04/13/2019, 05/14/2019, 12/12/2019     Objective: Vital Signs: There were no vitals taken for this visit.   Physical Exam   Musculoskeletal Exam: ***  CDAI Exam: CDAI Score: -- Patient Global: --; Provider Global: -- Swollen: --; Tender: -- Joint Exam 02/25/2021   No joint exam has been documented for this visit   There is currently no information documented on the homunculus. Go to the Rheumatology activity and complete the homunculus joint exam.  Investigation: No additional findings.  Imaging: No results found.  Recent Labs: Lab Results  Component Value Date   WBC 4.8 08/10/2020   HGB 14.9 08/10/2020   PLT 237 08/10/2020   NA 141 08/10/2020   K 4.8 08/10/2020   CL 99 08/10/2020   CO2 34 (H) 08/10/2020   GLUCOSE 206 (H) 08/10/2020   BUN 17 08/10/2020   CREATININE 0.69 (L) 08/10/2020   BILITOT 0.6 08/10/2020   ALKPHOS 85 03/19/2019   AST 8 (L) 08/10/2020   ALT 16 08/10/2020   PROT 6.8 08/10/2020   ALBUMIN 4.7 (H) 03/19/2019   CALCIUM 9.8 08/10/2020   GFRAA 102 08/10/2020    Speciality Comments: PLQ Eye Exam: 09/12/2019 WNL @ [place] up in 1 year  Procedures:  No procedures  performed Allergies: Sulfasalazine, Demerol [meperidine], Garlic, Other, and Sulfa antibiotics   Assessment / Plan:     Visit Diagnoses: No diagnosis found.  Orders: No orders of the defined types were placed in this encounter.  No orders of the defined types were placed in this encounter.   Face-to-face time spent with patient was *** minutes. Greater than 50% of time was spent in counseling and coordination of care.  Follow-Up Instructions: No follow-ups on file.   Earnestine Mealing, CMA  Note - This record has been created using Editor, commissioning.  Chart creation errors have been sought, but may not always  have been located. Such creation errors do not reflect on  the standard of medical care.

## 2021-02-25 ENCOUNTER — Ambulatory Visit: Payer: Medicare Other | Admitting: Rheumatology

## 2021-02-25 DIAGNOSIS — U071 COVID-19: Secondary | ICD-10-CM

## 2021-02-25 DIAGNOSIS — Z8546 Personal history of malignant neoplasm of prostate: Secondary | ICD-10-CM

## 2021-02-25 DIAGNOSIS — Z8669 Personal history of other diseases of the nervous system and sense organs: Secondary | ICD-10-CM

## 2021-02-25 DIAGNOSIS — I442 Atrioventricular block, complete: Secondary | ICD-10-CM

## 2021-02-25 DIAGNOSIS — Z79899 Other long term (current) drug therapy: Secondary | ICD-10-CM

## 2021-02-25 DIAGNOSIS — M5134 Other intervertebral disc degeneration, thoracic region: Secondary | ICD-10-CM

## 2021-02-25 DIAGNOSIS — M353 Polymyalgia rheumatica: Secondary | ICD-10-CM

## 2021-02-25 DIAGNOSIS — M19041 Primary osteoarthritis, right hand: Secondary | ICD-10-CM

## 2021-02-25 DIAGNOSIS — M8589 Other specified disorders of bone density and structure, multiple sites: Secondary | ICD-10-CM

## 2021-02-25 DIAGNOSIS — R5383 Other fatigue: Secondary | ICD-10-CM

## 2021-02-25 DIAGNOSIS — M0609 Rheumatoid arthritis without rheumatoid factor, multiple sites: Secondary | ICD-10-CM

## 2021-02-25 DIAGNOSIS — M5030 Other cervical disc degeneration, unspecified cervical region: Secondary | ICD-10-CM

## 2021-02-25 DIAGNOSIS — Z8639 Personal history of other endocrine, nutritional and metabolic disease: Secondary | ICD-10-CM

## 2021-02-25 DIAGNOSIS — M5136 Other intervertebral disc degeneration, lumbar region: Secondary | ICD-10-CM

## 2021-03-02 ENCOUNTER — Other Ambulatory Visit: Payer: Self-pay | Admitting: *Deleted

## 2021-03-02 DIAGNOSIS — Z79899 Other long term (current) drug therapy: Secondary | ICD-10-CM

## 2021-03-03 LAB — COMPLETE METABOLIC PANEL WITH GFR
AG Ratio: 1.9 (calc) (ref 1.0–2.5)
ALT: 12 U/L (ref 9–46)
AST: 7 U/L — ABNORMAL LOW (ref 10–35)
Albumin: 4.4 g/dL (ref 3.6–5.1)
Alkaline phosphatase (APISO): 69 U/L (ref 35–144)
BUN: 23 mg/dL (ref 7–25)
CO2: 35 mmol/L — ABNORMAL HIGH (ref 20–32)
Calcium: 9.6 mg/dL (ref 8.6–10.3)
Chloride: 101 mmol/L (ref 98–110)
Creat: 0.73 mg/dL (ref 0.70–1.22)
Globulin: 2.3 g/dL (calc) (ref 1.9–3.7)
Glucose, Bld: 132 mg/dL — ABNORMAL HIGH (ref 65–99)
Potassium: 4.8 mmol/L (ref 3.5–5.3)
Sodium: 141 mmol/L (ref 135–146)
Total Bilirubin: 0.5 mg/dL (ref 0.2–1.2)
Total Protein: 6.7 g/dL (ref 6.1–8.1)
eGFR: 90 mL/min/{1.73_m2} (ref >=60)

## 2021-03-03 LAB — CBC WITH DIFFERENTIAL/PLATELET
Absolute Monocytes: 496 cells/uL (ref 200–950)
Basophils Absolute: 11 cells/uL (ref 0–200)
Basophils Relative: 0.2 %
Eosinophils Absolute: 120 cells/uL (ref 15–500)
Eosinophils Relative: 2.1 %
HCT: 44 % (ref 38.5–50.0)
Hemoglobin: 14.5 g/dL (ref 13.2–17.1)
Lymphs Abs: 1596 cells/uL (ref 850–3900)
MCH: 31.2 pg (ref 27.0–33.0)
MCHC: 33 g/dL (ref 32.0–36.0)
MCV: 94.6 fL (ref 80.0–100.0)
MPV: 9.6 fL (ref 7.5–12.5)
Monocytes Relative: 8.7 %
Neutro Abs: 3477 cells/uL (ref 1500–7800)
Neutrophils Relative %: 61 %
Platelets: 256 10*3/uL (ref 140–400)
RBC: 4.65 10*6/uL (ref 4.20–5.80)
RDW: 12 % (ref 11.0–15.0)
Total Lymphocyte: 28 %
WBC: 5.7 10*3/uL (ref 3.8–10.8)

## 2021-03-03 NOTE — Progress Notes (Signed)
CBC WNL. Glucose is 132.  CO2 remains borderline elevated but stable. AST is 7. ALT WNL.  We will continue to monitor.

## 2021-03-03 NOTE — Progress Notes (Signed)
Office Visit Note  Patient: Jeffery [name]             Date of Birth: Mar 16, 1935           MRN: [id number]             PCP: Shon Baton, MD Referring: Shon Baton, MD Visit Date: 03/08/2021 Occupation: @GUAROCC @  Subjective:  fatigue   History of Present Illness: Jayion H Kharon Hixon is a 86 y.o. male with a history of rheumatoid arthritis, polymyalgia rheumatica and osteoarthritis.  He states he has been experiencing increased fatigue for the last several months.  He believes that is related to the aging process.  He continues to have some discomfort in his joints but he has not noticed any joint swelling.  He has been tolerating methotrexate and hydroxychloroquine without any problems.  He denies any muscular weakness or muscular tenderness.  He feels that his gait is not very stable and would like to go to physical therapy.  He is going to [facility] in January and would like to have physical therapy there.  He continues to have some lower back pain off and on.  Activities of Daily Living:  Patient reports morning stiffness for all day.  Patient Denies nocturnal pain.  Difficulty dressing/grooming: Denies Difficulty climbing stairs: Denies Difficulty getting out of chair: Denies Difficulty using hands for taps, buttons, cutlery, and/or writing: Denies  Review of Systems  Constitutional:  Positive for fatigue.  HENT:  Negative for mouth sores, mouth dryness and nose dryness.   Eyes:  Negative for pain, itching and dryness.  Respiratory:  Negative for shortness of breath and difficulty breathing.   Cardiovascular:  Negative for chest pain and palpitations.  Gastrointestinal:  Negative for blood in stool, constipation and diarrhea.  Endocrine: Negative for increased urination.  Genitourinary:  Negative for difficulty urinating.  Musculoskeletal:  Positive for joint pain, joint pain, myalgias, morning stiffness, muscle tenderness and myalgias. Negative for joint swelling.   Skin:  Negative for color change, rash and redness.  Allergic/Immunologic: Negative for susceptible to infections.  Neurological:  Positive for weakness. Negative for dizziness, numbness and headaches.  Hematological:  Negative for bruising/bleeding tendency.  Psychiatric/Behavioral:  Negative for sleep disturbance.    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   Cystoid macular edema of right eye 08/26/2019   Pseudophakia of both eyes 08/26/2019   Obstructive sleep apnea of adult 08/26/2019   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) 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 08/24/2019   Educated about COVID-19 virus infection 10/08/2018   Aortic root dilatation ([place]) 10/08/2018   Mitral annular calcification 10/08/2018   Neuropathy 08/21/2017   Hydroxychloroquine causing adverse effect in therapeutic use 08/21/2017   Symptomatic bradycardia 02/23/2017   Status cardiac pacemaker 02/23/2017   CSA (central sleep apnea) 02/23/2017   Heart block AV complete ([place]) 01/18/2017   Vitamin D deficiency 02/08/2016   Trigger finger, left middle finger 02/08/2016   Long-term use of Plaquenil 02/08/2016   Bursitis of right shoulder 02/08/2016   DDD (degenerative disc disease), lumbar 02/08/2016   DDD (degenerative disc disease), thoracic 02/08/2016   Rheumatoid arthritis of multiple sites without rheumatoid factor ([place]) 11/28/2015  Class: Chronic   Other secondary osteoarthritis of both hands 11/28/2015    Class: Chronic   PMR (polymyalgia rheumatica) (HCC) 11/28/2015    Class: Chronic   Osteoporosis 11/28/2015    Class: Chronic   Prostate cancer ([place]) 11/28/2015    Class: History of   Duodenal ulcer  11/28/2015    Class: Chronic   High cholesterol 11/28/2015   Dizziness 07/16/2015   Incomplete RBBB 03/31/2015   Sleep apnea, primary central 03/03/2015   Polyneuropathy ([place]) 01/24/2013   Right inguinal hernia 07/05/2012    Past Medical History:  Diagnosis Date   Arthritis    BCC (basal cell carcinoma)    BPH (benign prostatic hypertrophy) 2008   Cancer [facility]) 2008   Prostate   Cervical stenosis of spine    per patient    Complete heart block (HCC)    Complex sleep apnea syndrome    does not use CPAP   Diabetes mellitus without complication (HCC)    Diverticulosis    ED (erectile dysfunction)    GERD (gastroesophageal reflux disease)    Hemorrhoids    Hernia, inguinal    Hyperlipidemia    IFG (impaired fasting glucose)    Osteopenia    Peptic ulcer disease with hemorrhage 1993   Shingles    Vitamin D deficiency     Family History  Problem Relation Age of Onset   Heart failure Mother        Died age 70.  Apparently had valve surgery.    Congenital heart disease Brother    Past Surgical History:  Procedure Laterality Date   BACK SURGERY N/A 1984   CATARACT EXTRACTION, BILATERAL     HERNIA REPAIR     LAMINECTOMY  1984   L 5   PACEMAKER IMPLANT  12/05/2016   MDT Azure XT DR MRI conditional PPM implanted for CHB by Dr Vista Deck in [place]  12/05/2016   PROSTATE SURGERY  12/2006   Robotic   TONSILLECTOMY  86 years old   [place]  03/01/2012   Procedure: MINOR RELEASE TRIGGER FINGER/A-1 PULLEY;  Surgeon: Cammie Sickle., MD;  Location: [facility];  Service: Orthopedics;  Laterality: Left;  long finger   TRIGGER FINGER RELEASE Left 02/08/2018   Procedure: RELEASE TRIGGER FINGER/A-1 PULLEY LEFT RING FINGER;  Surgeon: Daryll Brod, MD;  Location: [facility];  Service: Orthopedics;  Laterality: Left;   Social History   Social History Narrative   Right handed, Retired .Married, 2 kids. Caffeine none.   3  Step    Immunization History  Administered Date(s) Administered   Influenza, High Dose Seasonal PF 03/03/2017, 01/26/2018   Moderna SARS-COV2 Booster Vaccination 10/15/2020   Moderna Sars-Covid-2 Vaccination 04/13/2019, 05/14/2019, 12/12/2019     Objective: Vital Signs: BP 120/72 (BP Location: Left Arm, Patient Position: Sitting, Cuff Size: Normal)   Pulse 81   Ht 6\' 1"  (1.854 m)   Wt 170 lb 6.4 oz (77.3 kg)   BMI 22.48 kg/m    Physical Exam Vitals and nursing note reviewed.  Constitutional:      Appearance: He is well-developed.  HENT:     Head: Normocephalic and atraumatic.  Eyes:     Conjunctiva/sclera: Conjunctivae normal.     Pupils: Pupils are equal, round, and reactive to light.  Cardiovascular:     Rate and Rhythm: Normal rate and regular rhythm.     Heart sounds: Normal heart sounds.  Pulmonary:  Effort: Pulmonary effort is normal.     Breath sounds: Normal breath sounds.  Abdominal:     General: Bowel sounds are normal.     Palpations: Abdomen is soft.  Musculoskeletal:     Cervical back: Normal range of motion and neck supple.  Skin:    General: Skin is warm and dry.     Capillary Refill: Capillary refill takes less than 2 seconds.  Neurological:     Mental Status: He is alert and oriented to person, place, and time.  Psychiatric:        Behavior: Behavior normal.     Musculoskeletal Exam: He had limited range of motion of his cervical spine.  Shoulder joints, elbow joints, wrist joints with good range of motion.  He had discomfort range of motion of lumbar spine.  Shoulder joints, elbow joints, wrist joints with good range of motion.  He had bilateral PIP and DIP thickening with limited extension of his bilateral third PIP joints.  Hip joints and knee joints in good range of motion.  There was no tenderness over ankles or MTPs.  CDAI Exam: CDAI Score: 0.4  Patient Global: 3 mm; Provider Global: 1 mm Swollen: 0 ; Tender: 0  Joint Exam 03/08/2021    No joint exam has been documented for this visit   There is currently no information documented on the homunculus. Go to the Rheumatology activity and complete the homunculus joint exam.  Investigation: No additional findings.  Imaging: No results found.  Recent Labs: Lab Results  Component Value Date   WBC 5.7 03/02/2021   HGB 14.5 03/02/2021   PLT 256 03/02/2021   NA 141 03/02/2021   K 4.8 03/02/2021   CL 101 03/02/2021   CO2 35 (H) 03/02/2021   GLUCOSE 132 (H) 03/02/2021   BUN 23 03/02/2021   CREATININE 0.73 03/02/2021   BILITOT 0.5 03/02/2021   ALKPHOS 85 03/19/2019   AST 7 (L) 03/02/2021   ALT 12 03/02/2021   PROT 6.7 03/02/2021   ALBUMIN 4.7 (H) 03/19/2019   CALCIUM 9.6 03/02/2021   GFRAA 102 08/10/2020    Speciality Comments: PLQ Eye Exam: 07/27/2020 WNL @ Dr. Zadie Rhine. Follow up in 1 year  Procedures:  No procedures performed Allergies: Sulfasalazine, Demerol [meperidine], Garlic, Other, and Sulfa antibiotics   Assessment / Plan:     Visit Diagnoses: Rheumatoid arthritis of multiple sites without rheumatoid factor (HCC) - RF negative, CCP negative, 1433 eta negative, ANA negative.  He had no synovitis on examination.  High risk medication use - MTX 0.6 ml sq once weekly, folic acid 1 mg po daily, PLQ 200 mg BID. PLQ Eye Exam: 07/27/2020.  Labs obtained on March 02, 2021 were reviewed which were within normal normal limits.  He was advised to get labs every 3 months.  Information regarding immunization was placed in the AVS.  He was advised to hold methotrexate and Plaquenil in case he develops an infection and restart after the infection resolves.  PMR (polymyalgia rheumatica) (HCC)-he had no muscular weakness or tenderness.  Primary osteoarthritis of both hands-joint protection muscle strengthening was discussed.  DDD (degenerative disc disease), cervical-he had limited range of motion of his cervical spine.  DDD (degenerative disc disease), thoracic-he  had no point tenderness.  DDD (degenerative disc disease), lumbar-he has chronic discomfort.  Other fatigue-history of chronic fatigue.  Gait instability-he has been noticing some gait instability.  I will refer him to physical therapy per his request.  Osteopenia of multiple sites -  03/21 T -1.9.  He is taking calcium and vitamin D and has been off bisphosphonates.  History of vitamin D deficiency  History of prostate cancer  History of sleep apnea  History of diabetes mellitus  Heart block AV complete ([place])   Orders: No orders of the defined types were placed in this encounter.  No orders of the defined types were placed in this encounter.    Follow-Up Instructions: Return in about 5 months (around 08/06/2021) for Rheumatoid arthritis, Polymyalgia rheumatica, Osteoarthritis.   Bo Merino, MD  Note - This record has been created using Editor, commissioning.  Chart creation errors have been sought, but may not always  have been located. Such creation errors do not reflect on  the standard of medical care.

## 2021-03-08 ENCOUNTER — Encounter: Payer: Self-pay | Admitting: Rheumatology

## 2021-03-08 ENCOUNTER — Ambulatory Visit (INDEPENDENT_AMBULATORY_CARE_PROVIDER_SITE_OTHER): Payer: Medicare Other | Admitting: Rheumatology

## 2021-03-08 ENCOUNTER — Other Ambulatory Visit: Payer: Self-pay

## 2021-03-08 VITALS — BP 120/72 | HR 81 | Ht 73.0 in | Wt 170.4 lb

## 2021-03-08 DIAGNOSIS — M353 Polymyalgia rheumatica: Secondary | ICD-10-CM

## 2021-03-08 DIAGNOSIS — M51369 Other intervertebral disc degeneration, lumbar region without mention of lumbar back pain or lower extremity pain: Secondary | ICD-10-CM

## 2021-03-08 DIAGNOSIS — R2681 Unsteadiness on feet: Secondary | ICD-10-CM

## 2021-03-08 DIAGNOSIS — Z8546 Personal history of malignant neoplasm of prostate: Secondary | ICD-10-CM

## 2021-03-08 DIAGNOSIS — R5383 Other fatigue: Secondary | ICD-10-CM

## 2021-03-08 DIAGNOSIS — M5134 Other intervertebral disc degeneration, thoracic region: Secondary | ICD-10-CM

## 2021-03-08 DIAGNOSIS — M19041 Primary osteoarthritis, right hand: Secondary | ICD-10-CM | POA: Diagnosis not present

## 2021-03-08 DIAGNOSIS — M0609 Rheumatoid arthritis without rheumatoid factor, multiple sites: Secondary | ICD-10-CM

## 2021-03-08 DIAGNOSIS — M5030 Other cervical disc degeneration, unspecified cervical region: Secondary | ICD-10-CM

## 2021-03-08 DIAGNOSIS — Z8639 Personal history of other endocrine, nutritional and metabolic disease: Secondary | ICD-10-CM

## 2021-03-08 DIAGNOSIS — M5136 Other intervertebral disc degeneration, lumbar region: Secondary | ICD-10-CM

## 2021-03-08 DIAGNOSIS — Z79899 Other long term (current) drug therapy: Secondary | ICD-10-CM | POA: Diagnosis not present

## 2021-03-08 DIAGNOSIS — Z8669 Personal history of other diseases of the nervous system and sense organs: Secondary | ICD-10-CM

## 2021-03-08 DIAGNOSIS — I442 Atrioventricular block, complete: Secondary | ICD-10-CM

## 2021-03-08 DIAGNOSIS — U071 COVID-19: Secondary | ICD-10-CM

## 2021-03-08 DIAGNOSIS — M19042 Primary osteoarthritis, left hand: Secondary | ICD-10-CM

## 2021-03-08 DIAGNOSIS — M8589 Other specified disorders of bone density and structure, multiple sites: Secondary | ICD-10-CM

## 2021-03-08 NOTE — Patient Instructions (Signed)
Standing Labs We placed an order today for your standing lab work.   Please have your standing labs drawn in March and every 3 months  If possible, please have your labs drawn 2 weeks prior to your appointment so that the provider can discuss your results at your appointment.  Please note that you may see your imaging and lab results in [facility] before we have reviewed them. We may be awaiting multiple results to interpret others before contacting you. Please allow our office up to 72 hours to thoroughly review all of the results before contacting the office for clarification of your results.  We have open lab daily: Monday through Thursday from 1:30-4:30 PM and Friday from 1:30-4:00 PM at the office of Dr. Bo Merino, [facility].   Please be advised, all patients with office appointments requiring lab work will take precedent over walk-in lab work.  If possible, please come for your lab work on Monday and Friday afternoons, as you may experience shorter wait times. The office is located at [address], [place], [place], [place] [address] No appointment is necessary.   Labs are drawn by [facility]. Please bring your co-pay at the time of your lab draw.  You may receive a bill from [facility] for your lab work.  If you wish to have your labs drawn at another location, please call the office 24 hours in advance to send orders.  If you have any questions regarding directions or hours of operation,  please call [phone].   As a reminder, please drink plenty of water prior to coming for your lab work. Thanks!   Vaccines You are taking a medication(s) that can suppress your immune system.  The following immunizations are recommended: Flu annually Covid-19  Td/Tdap (tetanus, diphtheria, pertussis) every 10 years Pneumonia (Prevnar 15 then Pneumovax 23 at least 1 year apart.  Alternatively, can take Prevnar 20 without needing additional dose) Shingrix: 2 doses from 4 weeks  to 6 months apart  Please check with your PCP to make sure you are up to date.   If you have signs or symptoms of an infection or start antibiotics: First, call your PCP for workup of your infection. Hold your medication through the infection, until you complete your antibiotics, and until symptoms resolve if you take the following: Injectable medication (Actemra, Benlysta, Cimzia, Cosentyx, Enbrel, Humira, Kevzara, Orencia, Remicade, Simponi, Stelara, Taltz, Tremfya) Methotrexate Leflunomide (Arava) Mycophenolate (Cellcept) Morrie Sheldon, Olumiant, or Rinvoq

## 2021-03-11 ENCOUNTER — Ambulatory Visit: Payer: Medicare Other | Admitting: Neurology

## 2021-03-11 ENCOUNTER — Telehealth: Payer: Self-pay | Admitting: Neurology

## 2021-03-11 NOTE — Telephone Encounter (Signed)
Dr. Brett Fairy is out sick, I left a voice mail for the patient to call us back to reschedule.

## 2021-03-16 ENCOUNTER — Ambulatory Visit (INDEPENDENT_AMBULATORY_CARE_PROVIDER_SITE_OTHER): Payer: Medicare Other

## 2021-03-16 DIAGNOSIS — I442 Atrioventricular block, complete: Secondary | ICD-10-CM | POA: Diagnosis not present

## 2021-03-16 LAB — CUP PACEART REMOTE DEVICE CHECK
Battery Remaining Longevity: 87 mo
Battery Voltage: 2.99 V
Brady Statistic AP VP Percent: 3.07 %
Brady Statistic AP VS Percent: 0 %
Brady Statistic AS VP Percent: 96.86 %
Brady Statistic AS VS Percent: 0.07 %
Brady Statistic RA Percent Paced: 3.07 %
Brady Statistic RV Percent Paced: 99.93 %
Date Time Interrogation Session: 20221205222452
Implantable Lead Implant Date: 20180827
Implantable Lead Implant Date: 20180827
Implantable Lead Location: 753859
Implantable Lead Location: 753860
Implantable Lead Model: 4076
Implantable Lead Model: 4076
Implantable Pulse Generator Implant Date: 20180827
Lead Channel Impedance Value: 323 Ohm
Lead Channel Impedance Value: 380 Ohm
Lead Channel Impedance Value: 399 Ohm
Lead Channel Impedance Value: 456 Ohm
Lead Channel Pacing Threshold Amplitude: 0.625 V
Lead Channel Pacing Threshold Amplitude: 0.875 V
Lead Channel Pacing Threshold Pulse Width: 0.4 ms
Lead Channel Pacing Threshold Pulse Width: 0.4 ms
Lead Channel Sensing Intrinsic Amplitude: 23.375 mV
Lead Channel Sensing Intrinsic Amplitude: 23.375 mV
Lead Channel Sensing Intrinsic Amplitude: 4.5 mV
Lead Channel Sensing Intrinsic Amplitude: 4.5 mV
Lead Channel Setting Pacing Amplitude: 1.5 V
Lead Channel Setting Pacing Amplitude: 2.5 V
Lead Channel Setting Pacing Pulse Width: 0.4 ms
Lead Channel Setting Sensing Sensitivity: 4 mV

## 2021-03-25 NOTE — Progress Notes (Signed)
Remote pacemaker transmission.   

## 2021-03-26 ENCOUNTER — Other Ambulatory Visit: Payer: Self-pay | Admitting: *Deleted

## 2021-03-26 DIAGNOSIS — M0609 Rheumatoid arthritis without rheumatoid factor, multiple sites: Secondary | ICD-10-CM

## 2021-03-26 MED ORDER — HYDROXYCHLOROQUINE SULFATE 200 MG PO TABS
ORAL_TABLET | ORAL | 0 refills | Status: DC
Start: 2021-03-26 — End: 2021-08-16

## 2021-03-26 NOTE — Telephone Encounter (Signed)
Next Visit: 08/06/2021  Last Visit: 03/08/2021  Labs: 03/02/2021, CBC WNL. Glucose is 132.  CO2 remains borderline elevated but stable. AST is 7. ALT WNL.  We will continue to monitor.   Eye exam: 03/09/2021 WNL   Current Dose per office note 03/08/2021: PLQ 200 mg BID  DX: Rheumatoid arthritis of multiple sites without rheumatoid factor  Last Fill: 11/16/2020  Okay to refill Plaquenil?

## 2021-04-11 HISTORY — PX: UPPER GI ENDOSCOPY: SHX6162

## 2021-04-11 HISTORY — PX: OTHER SURGICAL HISTORY: SHX169

## 2021-04-19 ENCOUNTER — Encounter (INDEPENDENT_AMBULATORY_CARE_PROVIDER_SITE_OTHER): Payer: Medicare Other | Admitting: Ophthalmology

## 2021-05-04 ENCOUNTER — Encounter (INDEPENDENT_AMBULATORY_CARE_PROVIDER_SITE_OTHER): Payer: Medicare Other | Admitting: Ophthalmology

## 2021-06-15 ENCOUNTER — Ambulatory Visit (INDEPENDENT_AMBULATORY_CARE_PROVIDER_SITE_OTHER): Payer: Medicare Other

## 2021-06-15 DIAGNOSIS — I442 Atrioventricular block, complete: Secondary | ICD-10-CM

## 2021-06-15 LAB — CUP PACEART REMOTE DEVICE CHECK
Battery Remaining Longevity: 85 mo
Battery Voltage: 2.99 V
Brady Statistic AP VP Percent: 7.6 %
Brady Statistic AP VS Percent: 0 %
Brady Statistic AS VP Percent: 90.73 %
Brady Statistic AS VS Percent: 1.66 %
Brady Statistic RA Percent Paced: 8.08 %
Brady Statistic RV Percent Paced: 98.33 %
Date Time Interrogation Session: 20230306210051
Implantable Lead Implant Date: 20180827
Implantable Lead Implant Date: 20180827
Implantable Lead Location: 753859
Implantable Lead Location: 753860
Implantable Lead Model: 4076
Implantable Lead Model: 4076
Implantable Pulse Generator Implant Date: 20180827
Lead Channel Impedance Value: 342 Ohm
Lead Channel Impedance Value: 399 Ohm
Lead Channel Impedance Value: 399 Ohm
Lead Channel Impedance Value: 456 Ohm
Lead Channel Pacing Threshold Amplitude: 0.625 V
Lead Channel Pacing Threshold Amplitude: 0.625 V
Lead Channel Pacing Threshold Pulse Width: 0.4 ms
Lead Channel Pacing Threshold Pulse Width: 0.4 ms
Lead Channel Sensing Intrinsic Amplitude: 23.375 mV
Lead Channel Sensing Intrinsic Amplitude: 23.375 mV
Lead Channel Sensing Intrinsic Amplitude: 4.625 mV
Lead Channel Sensing Intrinsic Amplitude: 4.625 mV
Lead Channel Setting Pacing Amplitude: 1.5 V
Lead Channel Setting Pacing Amplitude: 2.5 V
Lead Channel Setting Pacing Pulse Width: 0.4 ms
Lead Channel Setting Sensing Sensitivity: 4 mV

## 2021-06-28 ENCOUNTER — Ambulatory Visit: Payer: Medicare Other | Admitting: Neurology

## 2021-06-28 NOTE — Progress Notes (Signed)
Remote pacemaker transmission.   

## 2021-06-29 ENCOUNTER — Ambulatory Visit (HOSPITAL_COMMUNITY)
Admission: RE | Admit: 2021-06-29 | Discharge: 2021-06-29 | Disposition: A | Discharge status: Home / Self Care | Payer: Medicare Other | Source: Ambulatory Visit | Attending: Cardiology | Admitting: Cardiology

## 2021-06-29 ENCOUNTER — Other Ambulatory Visit: Payer: Self-pay

## 2021-06-29 DIAGNOSIS — I77810 Thoracic aortic ectasia: Secondary | ICD-10-CM | POA: Insufficient documentation

## 2021-06-29 LAB — POCT I-STAT CREATININE: Creatinine, Ser: 0.8 mg/dL (ref 0.61–1.24)

## 2021-06-29 MED ORDER — SODIUM CHLORIDE (PF) 0.9 % IJ SOLN
INTRAMUSCULAR | Status: AC
  Filled 2021-06-29: qty 50

## 2021-06-29 MED ORDER — IOHEXOL 350 MG/ML SOLN
80.0000 mL | Freq: Once | INTRAVENOUS | Status: AC | PRN
  Administered 2021-06-29: 80 mL via INTRAVENOUS

## 2021-07-04 DIAGNOSIS — I10 Essential (primary) hypertension: Secondary | ICD-10-CM | POA: Insufficient documentation

## 2021-07-04 DIAGNOSIS — R918 Other nonspecific abnormal finding of lung field: Secondary | ICD-10-CM | POA: Insufficient documentation

## 2021-07-04 NOTE — Progress Notes (Signed)
?  ?Cardiology Office Note ? ? ?Date:  07/05/2021  ? ?ID:  Jeffery [name], Jeffery [name] [birth year], MRN [id number] ? ?PCP:  Jeffery Baton, MD  ?Cardiologist:   Jeffery Breeding, MD ? ? ?Chief Complaint  ?Patient presents with  ? Leg Pain  ? ? ?  ?History of Present Illness: ?Jeffery [name] is a 87 y.o. male who presents for follow up of third degree HB.  He has a pacemaker per Dr. Rayann [name].  He had a follow up echo in December 2021.  He has mildly enlarged aortic root.  He had a CT last week and had a 39 mm aorta with small pulmonary nodules.   ? ?He presents for follow-up.  His biggest issue is leg pain.  He has been diagnosed with lumbar and cervical stenosis.  He says he has hip pain although he still tries to do activities with this that limits him.  He had ABIs a couple of years ago that were normal.  He is not having any palpitations, presyncope or syncope.  He is not having any new shortness of breath, PND or orthopnea.  He had chest pressure, neck or arm discomfort. ? ?Of note he did have some aortic atherosclerosis, coronary calcium as noted previously on echo mitral annular calcification. ? ? ?Past Medical History:  ?Diagnosis Date  ? Arthritis   ? BCC (basal cell carcinoma)   ? BPH (benign prostatic hypertrophy) 2008  ? Cancer [facility]) 2008  ? Prostate  ? Cervical stenosis of spine   ? per patient   ? Complete heart block ([place])   ? Complex sleep apnea syndrome   ? does not use CPAP  ? Diabetes mellitus without complication ([place])   ? Diverticulosis   ? ED (erectile dysfunction)   ? GERD (gastroesophageal reflux disease)   ? Hemorrhoids   ? Hernia, inguinal   ? Hyperlipidemia   ? IFG (impaired fasting glucose)   ? Osteopenia   ? Peptic ulcer disease with hemorrhage 1993  ? Shingles   ? Vitamin D deficiency   ? ? ?Past Surgical History:  ?Procedure Laterality Date  ? BACK SURGERY N/A 1984  ? CATARACT EXTRACTION, BILATERAL    ? HERNIA REPAIR    ? LAMINECTOMY  1984  ? L 5  ? PACEMAKER IMPLANT  12/05/2016  ? MDT Santina Evans DR MRI conditional PPM implanted for CHB by Dr Vista Deck in [place]  ? PACEMAKER INSERTION  12/05/2016  ? PROSTATE SURGERY  12/2006  ? Robotic  ? TONSILLECTOMY  87 years old  ? TRIGGER FINGER RELEASE  03/01/2012  ? Procedure: MINOR RELEASE TRIGGER FINGER/A-1 PULLEY;  Surgeon: Cammie Sickle., MD;  Location: [place];  Service: Orthopedics;  Laterality: Left;  long finger  ? TRIGGER FINGER RELEASE Left 02/08/2018  ? Procedure: RELEASE TRIGGER FINGER/A-1 PULLEY LEFT RING FINGER;  Surgeon: Daryll Brod, MD;  Location: [place];  Service: Orthopedics;  Laterality: Left;  ? ? ? ?Current Outpatient Medications  ?Medication Sig Dispense Refill  ? acetaminophen (TYLENOL) 500 MG tablet Take 500 mg by mouth every 6 (six) hours as needed.    ? Calcium Carbonate (CALCIUM 600 PO) Take 600 mg by mouth daily.    ? Cholecalciferol (VITAMIN D) 2000 units CAPS Take by mouth daily.    ? Cholecalciferol (VITAMIN D3) 1.25 MG (50000 UT) CAPS TAKE 1 CAPSULE BY MOUTH ONCE PER MONTH 12 capsule 0  ? Coenzyme Q10 200 MG capsule Take  200 mg by mouth 2 (two) times daily.     ? colesevelam (WELCHOL) 625 MG tablet Take 1,250 mg by mouth 3 (three) times daily.     ? COVID-19 mRNA vaccine, Moderna, 100 MCG/0.5ML injection Inject into the muscle. 0.25 mL 0  ? diclofenac sodium (VOLTAREN) 1 % GEL Apply 2-4 grams to affected joint, up to 4 times daily. (Patient taking differently: Apply 2-4 grams to affected joint, up to 4 times daily as needed) 342 g 2  ? folic acid (FOLVITE) 1 MG tablet TAKE 1 TABLET BY MOUTH ONCE DAILY 90 tablet 3  ? hydroxychloroquine (PLAQUENIL) 200 MG tablet TAKE 1 TABLET(200 MG) BY MOUTH TWICE DAILY 180 tablet 0  ? metFORMIN (GLUCOPHAGE) 500 MG tablet Take 500 mg daily by mouth.     ? Methotrexate Sodium (METHOTREXATE, PF,) 50 MG/2ML injection ADMINISTER 0.6 ML UNDER THE SKIN 1 TIME WEEKLY 8 mL 2  ? omega-3 acid ethyl esters (LOVAZA) 1 g capsule TAKE 1 CAPSULE BY MOUTH THREE TIMES A WEEK 36  capsule 0  ? ONE TOUCH ULTRA TEST test strip TEST BLOOD SUGAR BID AS DIRECTED  2  ? TUBERCULIN SYR 1CC/27GX1/2" (SAFETY-LOK TB SYRINGE 27GX.5") 27G X 1/2" 1 ML MISC USE TO INJECT METHOTREXATE 12 each 3  ? vitamin E 100 UNIT capsule Take 100 Units by mouth daily.    ? zolpidem (AMBIEN) 5 MG tablet Take 5 mg by mouth as needed.    ? ?No current facility-administered medications for this visit.  ? ? ?Allergies:   Sulfasalazine, Demerol [meperidine], Garlic, Other, and Sulfa antibiotics  ? ? ?ROS:  Please see the history of present illness.   Otherwise, review of systems are positive for none   All other systems are reviewed and negative.  ? ? ?PHYSICAL EXAM: ?VS:  BP (!) 120/58 (BP Location: Left Arm, Patient Position: Sitting, Cuff Size: Normal)   Pulse 88   Ht '6\' 1"'$  (1.854 m)   Wt 164 lb (74.4 kg)   BMI 21.64 kg/m?  , BMI Body mass index is 21.64 kg/m?. ?GENERAL:  Well appearing ?NECK:  No jugular venous distention, waveform within normal limits, carotid upstroke brisk and symmetric, no bruits, no thyromegaly ?LUNGS:  Clear to auscultation bilaterally ?CHEST:  Unremarkable, well-healed pacemaker pocket ?HEART:  PMI not displaced or sustained,S1 and S2 within normal limits, no S3, no S4, no clicks, no rubs, no murmurs ?ABD:  Flat, positive bowel sounds normal in frequency in pitch, no bruits, no rebound, no guarding, no midline pulsatile mass, no hepatomegaly, no splenomegaly ?EXT:  2 plus pulses throughout, no edema, no cyanosis no clubbing ? ? ?EKG:  EKG is  ordered today. ?The ekg ordered today demonstrates sinus rhythm rate 88, ventricular pacing 100% capture ? ? ?Recent Labs: ?03/02/2021: ALT 12; BUN 23; Hemoglobin 14.5; Platelets 256; Potassium 4.8; Sodium 141 ?06/29/2021: Creatinine, Ser 0.80  ? ? ?Lipid Panel ?No results found for: CHOL, TRIG, HDL, CHOLHDL, VLDL, LDLCALC, LDLDIRECT ?  ? ?Wt Readings from Last 3 Encounters:  ?07/05/21 164 lb (74.4 kg)  ?03/08/21 170 lb 6.4 oz (77.3 kg)  ?08/12/20 163 lb 12.8  oz (74.3 kg)  ?  ? ? ?Other studies Reviewed: ?Additional studies/ records that were reviewed today include: Labs. ?Review of the above records demonstrates:  Please see elsewhere in the note.   ? ? ?ASSESSMENT AND PLAN: ? ?CHB:   He is up-to-date with pacemaker follow-up and had normal function earlier this month.  No change in therapy. ?  ?  SLEEP APNEA:    He has CPAP managed by Dr Brett Fairy.   ? ?HTN:     His blood pressure is at target.  No change in therapy.   ? ?DYSLIPIDEMIA:    LDL is 125 in May of last year with an HDL of 65.  However, he really would like to be off WelChol and limiting his medicines.  I do not think this is unreasonable and he will stop it and follow-up lipid profile in a few months to see where he is.  ?  ?AORTIC ROOT DILATATION.    This was 39 mm on CT last week.  I will follow this up again in 2 years. ? ?PULMONARY NODULES: He is low risk and so I will follow-up with noncontrasted CT in 1 year ? ?LEG PAIN: He had normal ABIs a couple of years ago.  He had normal pulses on this exam.  I would think this is probably more related to his back problems.  No change in therapy. ? ?Current medicines are reviewed at length with the patient today.  The patient does not have concerns regarding medicines. ? ?The following changes have been made:  no change ? ?Labs/ tests ordered today include:  ? ?Orders Placed This Encounter  ?Procedures  ? CT Chest Wo Contrast  ? EKG 12-Lead  ? ? ? ?Disposition:   FU with me in one year.   ? ? ?Signed, ?Jeffery Breeding, MD  ?07/05/2021 1:22 PM    ?[place] ? ? ? ?

## 2021-07-05 ENCOUNTER — Encounter: Payer: Self-pay | Admitting: Cardiology

## 2021-07-05 ENCOUNTER — Other Ambulatory Visit: Payer: Self-pay

## 2021-07-05 ENCOUNTER — Ambulatory Visit (INDEPENDENT_AMBULATORY_CARE_PROVIDER_SITE_OTHER): Payer: Medicare Other | Admitting: Cardiology

## 2021-07-05 VITALS — BP 120/58 | HR 88 | Ht 73.0 in | Wt 164.0 lb

## 2021-07-05 DIAGNOSIS — E785 Hyperlipidemia, unspecified: Secondary | ICD-10-CM | POA: Diagnosis not present

## 2021-07-05 DIAGNOSIS — I77810 Thoracic aortic ectasia: Secondary | ICD-10-CM

## 2021-07-05 DIAGNOSIS — I442 Atrioventricular block, complete: Secondary | ICD-10-CM | POA: Diagnosis not present

## 2021-07-05 DIAGNOSIS — R918 Other nonspecific abnormal finding of lung field: Secondary | ICD-10-CM

## 2021-07-05 DIAGNOSIS — I10 Essential (primary) hypertension: Secondary | ICD-10-CM | POA: Diagnosis not present

## 2021-07-05 NOTE — Patient Instructions (Signed)
Medication Instructions:  ?Your physician recommends that you continue on your current medications as directed. Please refer to the Current Medication list given to you today. ? ?*If you need a refill on your cardiac medications before your next appointment, please call your pharmacy* ? ? ?Testing/Procedures: ?Non-Cardiac CT scanning, (CAT scanning), is a noninvasive, special x-ray that produces cross-sectional images of the body using x-rays and a computer. CT scans help physicians diagnose and treat medical conditions. For some CT exams, a contrast material is used to enhance visibility in the area of the body being studied. CT scans provide greater clarity and reveal more details than regular x-ray exams. To be done in 1 year. ? ? ?Follow-Up: ?At [facility], you and your health needs are our priority.  As part of our continuing mission to provide you with exceptional heart care, we have created designated Provider Care Teams.  These Care Teams include your primary Cardiologist (physician) and Advanced Practice Providers (APPs -  Physician Assistants and Nurse Practitioners) who all work together to provide you with the care you need, when you need it. ? ?We recommend signing up for the patient portal called "MyChart".  Sign up information is provided on this After Visit Summary.  MyChart is used to connect with patients for Virtual Visits (Telemedicine).  Patients are able to view lab/test results, encounter notes, upcoming appointments, etc.  Non-urgent messages can be sent to your provider as well.   ?To learn more about what you can do with MyChart, go to [email or web address].   ? ?Your next appointment:   ?12 month(s) ? ?The format for your next appointment:   ?In Person ? ?Provider:   ?Minus Breeding, MD   ?

## 2021-08-02 ENCOUNTER — Other Ambulatory Visit: Payer: Self-pay | Admitting: Physician Assistant

## 2021-08-02 DIAGNOSIS — Z79899 Other long term (current) drug therapy: Secondary | ICD-10-CM

## 2021-08-03 ENCOUNTER — Other Ambulatory Visit: Payer: Self-pay | Admitting: *Deleted

## 2021-08-03 DIAGNOSIS — Z79899 Other long term (current) drug therapy: Secondary | ICD-10-CM

## 2021-08-03 LAB — CBC WITH DIFFERENTIAL/PLATELET
Absolute Monocytes: 600 cells/uL (ref 200–950)
Basophils Absolute: 10 cells/uL (ref 0–200)
Basophils Relative: 0.2 %
Eosinophils Absolute: 120 cells/uL (ref 15–500)
Eosinophils Relative: 2.4 %
HCT: 42.6 % (ref 38.5–50.0)
Hemoglobin: 14.3 g/dL (ref 13.2–17.1)
Lymphs Abs: 1680 cells/uL (ref 850–3900)
MCH: 31.3 pg (ref 27.0–33.0)
MCHC: 33.6 g/dL (ref 32.0–36.0)
MCV: 93.2 fL (ref 80.0–100.0)
MPV: 9.4 fL (ref 7.5–12.5)
Monocytes Relative: 12 %
Neutro Abs: 2590 cells/uL (ref 1500–7800)
Neutrophils Relative %: 51.8 %
Platelets: 265 10*3/uL (ref 140–400)
RBC: 4.57 10*6/uL (ref 4.20–5.80)
RDW: 12 % (ref 11.0–15.0)
Total Lymphocyte: 33.6 %
WBC: 5 10*3/uL (ref 3.8–10.8)

## 2021-08-03 LAB — COMPLETE METABOLIC PANEL WITH GFR
AG Ratio: 1.8 (calc) (ref 1.0–2.5)
ALT: 15 U/L (ref 9–46)
AST: 10 U/L (ref 10–35)
Albumin: 4.2 g/dL (ref 3.6–5.1)
Alkaline phosphatase (APISO): 71 U/L (ref 35–144)
BUN: 19 mg/dL (ref 7–25)
CO2: 35 mmol/L — ABNORMAL HIGH (ref 20–32)
Calcium: 9.6 mg/dL (ref 8.6–10.3)
Chloride: 99 mmol/L (ref 98–110)
Creat: 0.85 mg/dL (ref 0.70–1.22)
Globulin: 2.4 g/dL (calc) (ref 1.9–3.7)
Glucose, Bld: 148 mg/dL — ABNORMAL HIGH (ref 65–99)
Potassium: 4.8 mmol/L (ref 3.5–5.3)
Sodium: 140 mmol/L (ref 135–146)
Total Bilirubin: 0.6 mg/dL (ref 0.2–1.2)
Total Protein: 6.6 g/dL (ref 6.1–8.1)
eGFR: 86 mL/min/{1.73_m2} (ref >=60)

## 2021-08-03 NOTE — Telephone Encounter (Signed)
Next Visit: 08/18/2021 ?  ?Last Visit: 03/08/2021 ?  ?Labs: 03/02/2021 CBC WNL. Glucose is 132.  CO2 remains borderline elevated but stable. AST is 7. ALT WNL.  ? ?Dx: Rheumatoid arthritis of multiple sites without rheumatoid factor  ? ?Current Dose per office note to 03/08/2021: MTX 0.6 ml sq once weekly ? ?Last Fill: 08/10/2020 ? ?Patient advised he is due to update labs. Patient states he will come to the office today to update.  ? ?Okay to refill MTX?  ?

## 2021-08-04 NOTE — Progress Notes (Signed)
CBC WNL.  Glucose is 148.  Rest of CMP WNL.

## 2021-08-04 NOTE — Progress Notes (Signed)
? ?Office Visit Note ? ?Patient: Jeffery [name]             ?Date of Birth: September 27, 1934           ?MRN: [id number]             ?PCP: Shon Baton, MD ?Referring: Shon Baton, MD ?Visit Date: 08/18/2021 ?Occupation: '@GUAROCC'$ @ ? ?Subjective:  ?(Patient reports generalized fatigue and leg weakness. ) ? ? ?History of Present Illness: Jeffery [name] is a 87 y.o. male with history of rheumatoid arthritis, polymyalgia rheumatica, osteoarthritis and degenerative disc disease.  He states he has been taking methotrexate 0.6 mL subcutaneous weekly along with hydroxychloroquine 200 mg p.o. twice daily.  He states his rheumatoid arthritis is well controlled without any joints swelling.  He denies any increase proximal muscle weakness or tenderness.  He continues to have discomfort in his thoracic and lumbar spine.  He has been followed by neurosurgery.  He states he has been getting epidural injections in his lumbar spine.  His last injection was few months ago.  He continues to have some gait instability and feels weakness in his lower extremities.  He has been walking in the driveway for exercise and also has been doing some exercises given by physical therapy. ? ?Activities of Daily Living:  ?Patient reports morning stiffness for 1 hour.   ?Patient Denies nocturnal pain.  ?Difficulty dressing/grooming: Denies ?Difficulty climbing stairs: Denies ?Difficulty getting out of chair: Denies ?Difficulty using hands for taps, buttons, cutlery, and/or writing: Denies ? ?Review of Systems  ?Constitutional:  Positive for fatigue.  ?HENT:  Negative for mouth sores, mouth dryness and nose dryness.   ?Eyes:  Negative for pain, itching and dryness.  ?Respiratory:  Negative for shortness of breath and difficulty breathing.   ?Cardiovascular:  Negative for chest pain and palpitations.  ?Gastrointestinal:  Negative for blood in stool, constipation and diarrhea.  ?Endocrine: Negative for increased urination.  ?Genitourinary:  Negative  for difficulty urinating.  ?Musculoskeletal:  Positive for myalgias, muscle weakness, morning stiffness and myalgias. Negative for joint pain, joint pain, joint swelling and muscle tenderness.  ?Skin:  Negative for color change, rash and redness.  ?Allergic/Immunologic: Negative for susceptible to infections.  ?Neurological:  Negative for dizziness, numbness, headaches and weakness.  ?Hematological:  Negative for bruising/bleeding tendency.  ?Psychiatric/Behavioral:  Negative for sleep disturbance.   ? ?PMFS History:  ?Patient Active Problem List  ? Diagnosis Date Noted  ? Vitreomacular adhesion, right 08/16/2021  ? Macular retinoschisis of right eye 08/16/2021  ? Pulmonary nodules 07/04/2021  ? Essential hypertension 07/04/2021  ? Sleep apnea 06/28/2020  ? Dyslipidemia 06/28/2020  ? Memory changes 04/16/2020  ? Pacemaker ECG pattern 09/20/2019  ? Treatment-emergent central sleep apnea 09/20/2019  ? Round hole of right eye 08/26/2019  ? 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 Cystoid macular edema of right eye 08/26/2019  ? Pseudophakia of both eyes 08/26/2019  ? Obstructive sleep apnea of adult 08/26/2019  ? 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) 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 08/24/2019  ? Educated about COVID-19 virus infection 10/08/2018  ? Aortic root dilatation ([place]) 10/08/2018  ? Mitral annular calcification 10/08/2018  ? Neuropathy 08/21/2017  ? Hydroxychloroquine causing adverse effect in therapeutic use 08/21/2017  ? Symptomatic bradycardia 02/23/2017  ? Status cardiac pacemaker 02/23/2017  ? CSA (central sleep apnea) 02/23/2017  ? Heart block AV complete ([place]) 01/18/2017  ? Vitamin D deficiency 02/08/2016  ? Trigger finger, left middle  finger 02/08/2016  ? Long-term use of Plaquenil 02/08/2016  ? Bursitis of right shoulder 02/08/2016  ? DDD (degenerative disc disease), lumbar 02/08/2016  ? DDD (degenerative disc disease), thoracic 02/08/2016  ? Rheumatoid arthritis of  multiple sites without rheumatoid factor ([place]) 11/28/2015  ?  Class: Chronic  ? Other secondary osteoarthritis of both hands 11/28/2015  ?  Class: Chronic  ? PMR (polymyalgia rheumatica) ([facility]) 11/28/2015  ?  Class: Chronic  ? Osteoporosis 11/28/2015  ?  Class: Chronic  ? Prostate cancer ([place]) 11/28/2015  ?  Class: History of  ? Duodenal ulcer 11/28/2015  ?  Class: Chronic  ? High cholesterol 11/28/2015  ? Dizziness 07/16/2015  ? Incomplete RBBB 03/31/2015  ? Sleep apnea, primary central 03/03/2015  ? Polyneuropathy ([place]) 01/24/2013  ? Right inguinal hernia 07/05/2012  ?  ?Past Medical History:  ?Diagnosis Date  ? Arthritis   ? BCC (basal cell carcinoma)   ? BPH (benign prostatic hypertrophy) 2008  ? Cancer [facility]) 2008  ? Prostate  ? Cervical stenosis of spine   ? per patient   ? Complete heart block ([place])   ? Complex sleep apnea syndrome   ? does not use CPAP  ? Diabetes mellitus without complication ([place])   ? Diverticulosis   ? ED (erectile dysfunction)   ? GERD (gastroesophageal reflux disease)   ? Hemorrhoids   ? Hernia, inguinal   ? Hyperlipidemia   ? IFG (impaired fasting glucose)   ? Osteopenia   ? Peptic ulcer disease with hemorrhage 1993  ? Shingles   ? Vitamin D deficiency   ?  ?Family History  ?Problem Relation Age of Onset  ? Heart failure Mother   ?     Died age 44.  Apparently had valve surgery.   ? Congenital heart disease Brother   ? ?Past Surgical History:  ?Procedure Laterality Date  ? BACK SURGERY N/A 1984  ? CATARACT EXTRACTION, BILATERAL    ? HERNIA REPAIR    ? LAMINECTOMY  1984  ? L 5  ? PACEMAKER IMPLANT  12/05/2016  ? MDT Santina Evans DR MRI conditional PPM implanted for CHB by Dr Vista Deck in [place]  ? PACEMAKER INSERTION  12/05/2016  ? PROSTATE SURGERY  12/2006  ? Robotic  ? TONSILLECTOMY  87 years old  ? TRIGGER FINGER RELEASE  03/01/2012  ? Procedure: MINOR RELEASE TRIGGER FINGER/A-1 PULLEY;  Surgeon: Cammie Sickle., MD;  Location: [place];  Service: Orthopedics;  Laterality:  Left;  long finger  ? TRIGGER FINGER RELEASE Left 02/08/2018  ? Procedure: RELEASE TRIGGER FINGER/A-1 PULLEY LEFT RING FINGER;  Surgeon: Daryll Brod, MD;  Location: [place];  Service: Orthopedics;  Laterality: Left;  ? ?Social History  ? ?Social History Narrative  ? Right handed, Retired .Married, 2 kids. [place]affe[place] none.   3 Step   ? ?Immunization History  ?Administered Date(s) Administered  ? Influenza, High Dose Seasonal PF 03/03/2017, 01/26/2018  ? Moderna SARS-COV2 Booster Vaccination 10/15/2020  ? Moderna Sars-Covid-2 Vaccination 04/13/2019, 05/14/2019, 12/12/2019  ?  ? ?Objective: ?Vital Signs: BP 121/73 (BP Location: Left Arm, Patient Position: Sitting, Cuff Size: Normal)   Pulse 88   Ht '6\' 1"'$  (1.854 m)   Wt 162 lb 9.6 oz (73.8 kg)   BMI 21.45 kg/m?   ? ?Physical Exam ?Vitals and nursing note reviewed.  ?Constitutional:   ?   Appearance: He is well-developed.  ?HENT:  ?   Head: Normocephalic and atraumatic.  ?Eyes:  ?  Conjunctiva/sclera: Conjunctivae normal.  ?   Pupils: Pupils are equal, round, and reactive to light.  ?Cardiovascular:  ?   Rate and Rhythm: Normal rate and regular rhythm.  ?   Heart sounds: Normal heart sounds.  ?Pulmonary:  ?   Effort: Pulmonary effort is normal.  ?   Breath sounds: Normal breath sounds.  ?Abdominal:  ?   General: Bowel sounds are normal.  ?   Palpations: Abdomen is soft.  ?Musculoskeletal:  ?   Cervical back: Normal range of motion and neck supple.  ?Skin: ?   General: Skin is warm and dry.  ?   Capillary Refill: Capillary refill takes less than 2 seconds.  ?Neurological:  ?   Mental Status: He is alert and oriented to person, place, and time.  ?Psychiatric:     ?   Behavior: Behavior normal.  ?  ? ?Musculoskeletal Exam: He had limited range of motion of the cervical thoracic and lumbar spine.  He had no point tenderness over the entire spine.  Shoulder joints, elbow joints, wrist joints, MCPs PIPs and DIPs with good range of motion with no synovitis.   He had bilateral PIP and DIP thickening.  Hip joints and knee joints were in good range of motion without any warmth swelling or effusion.  There was no tenderness over ankles or MTPs. ? ?[facility] Exam: ?[facility] [place]

## 2021-08-06 ENCOUNTER — Ambulatory Visit: Payer: Medicare Other | Admitting: Rheumatology

## 2021-08-15 ENCOUNTER — Other Ambulatory Visit: Payer: Self-pay | Admitting: Rheumatology

## 2021-08-15 DIAGNOSIS — M0609 Rheumatoid arthritis without rheumatoid factor, multiple sites: Secondary | ICD-10-CM

## 2021-08-16 ENCOUNTER — Ambulatory Visit (INDEPENDENT_AMBULATORY_CARE_PROVIDER_SITE_OTHER): Payer: Medicare Other | Admitting: Ophthalmology

## 2021-08-16 ENCOUNTER — Encounter (INDEPENDENT_AMBULATORY_CARE_PROVIDER_SITE_OTHER): Payer: Self-pay | Admitting: Ophthalmology

## 2021-08-16 ENCOUNTER — Encounter (INDEPENDENT_AMBULATORY_CARE_PROVIDER_SITE_OTHER): Payer: Medicare Other | Admitting: Ophthalmology

## 2021-08-16 DIAGNOSIS — H43821 Vitreomacular adhesion, right eye: Secondary | ICD-10-CM | POA: Diagnosis not present

## 2021-08-16 DIAGNOSIS — H35071 Retinal telangiectasis, right eye: Secondary | ICD-10-CM

## 2021-08-16 DIAGNOSIS — H33101 Unspecified retinoschisis, right eye: Secondary | ICD-10-CM | POA: Diagnosis not present

## 2021-08-16 NOTE — Telephone Encounter (Signed)
Next Visit: 08/18/2021 ?  ?Last Visit: 03/08/2021 ?  ?Labs: 08/03/2021 CBC WNL.  Glucose is 148.  Rest of CMP WNL.  ? ?PLQ Eye Exam: 03/09/2021 WNL ? ?Dx: Rheumatoid arthritis of multiple sites without rheumatoid factor  ?  ?Current Dose per office note to 03/08/2021: PLQ 200 mg BID ? ?Last Fill: 03/26/2021 ? ?Okay to refill PLQ?  ?

## 2021-08-16 NOTE — Assessment & Plan Note (Signed)
Observe this minor condition OD ?

## 2021-08-16 NOTE — Assessment & Plan Note (Signed)
Focal changes remain unchanged but now has foveal macular schisis with a tangential elevation parafoveal, not active CME ?

## 2021-08-16 NOTE — Progress Notes (Signed)
? ? ?08/16/2021 ? ?  ? ?CHIEF COMPLAINT ?Patient presents for  ?Chief Complaint  ?Patient presents with  ? Retina Follow Up  ? ? ? ? ?HISTORY OF PRESENT ILLNESS: ?Jeffery [name] is a 87 y.o. male who presents to the clinic today for:  ? ?HPI   ? ? Retina Follow Up   ? ?      ? Diagnosis: Other  ? Laterality: right eye  ? Severity: moderate  ? Course: stable  ? ?  ?  ? ? Comments   ?1 YR FU OU OCT FP. ?Pt states no vision changes. ?Pt denies floaters and FOL. ? ?On BiPAP, better compliance but still has some leakage of the mask. ? ? ? ? ?  ?  ?Last edited by Hurman Horn, MD on 08/16/2021  3:58 PM.  ?  ? ? ?Referring physician: ?Shon Baton, MD ?[address] ?[place],  [place] [address] ? ?HISTORICAL INFORMATION:  ? ?Selected notes from the [facility] ?  ?   ? ?CURRENT MEDICATIONS: ?No current outpatient medications on file. (Ophthalmic Drugs)  ? ?No current facility-administered medications for this visit. (Ophthalmic Drugs)  ? ?Current Outpatient Medications (Other)  ?Medication Sig  ? acetaminophen (TYLENOL) 500 MG tablet Take 500 mg by mouth every 6 (six) hours as needed.  ? Calcium Carbonate (CALCIUM 600 PO) Take 600 mg by mouth daily.  ? Cholecalciferol (VITAMIN D) 2000 units CAPS Take by mouth daily.  ? Cholecalciferol (VITAMIN D3) 1.25 MG (50000 UT) CAPS TAKE 1 CAPSULE BY MOUTH ONCE PER MONTH  ? Coenzyme Q10 200 MG capsule Take 200 mg by mouth 2 (two) times daily.   ? colesevelam (WELCHOL) 625 MG tablet Take 1,250 mg by mouth 3 (three) times daily.   ? COVID-19 mRNA vaccine, Moderna, 100 MCG/0.5ML injection Inject into the muscle.  ? diclofenac sodium (VOLTAREN) 1 % GEL Apply 2-4 grams to affected joint, up to 4 times daily. (Patient taking differently: Apply 2-4 grams to affected joint, up to 4 times daily as needed)  ? folic acid (FOLVITE) 1 MG tablet TAKE 1 TABLET BY MOUTH ONCE DAILY  ? hydroxychloroquine (PLAQUENIL) 200 MG tablet TAKE 1 TABLET(200 MG) BY MOUTH TWICE DAILY  ? metFORMIN  (GLUCOPHAGE) 500 MG tablet Take 500 mg daily by mouth.   ? Methotrexate Sodium (METHOTREXATE, PF,) 50 MG/2ML injection ADMINISTER 0.6 ML UNDER THE SKIN 1 TIME WEEKLY  ? omega-3 acid ethyl esters (LOVAZA) 1 g capsule TAKE 1 CAPSULE BY MOUTH THREE TIMES A WEEK  ? ONE TOUCH ULTRA TEST test strip TEST BLOOD SUGAR BID AS DIRECTED  ? TUBERCULIN SYR 1CC/27GX1/2" (SAFETY-LOK TB SYRINGE 27GX.5") 27G X 1/2" 1 ML MISC USE TO INJECT METHOTREXATE  ? vitamin E 100 UNIT capsule Take 100 Units by mouth daily.  ? zolpidem (AMBIEN) 5 MG tablet Take 5 mg by mouth as needed.  ? ?No current facility-administered medications for this visit. (Other)  ? ? ? ? ?REVIEW OF SYSTEMS: ?ROS   ?Negative for: Constitutional, Gastrointestinal, Neurological, Skin, Genitourinary, Musculoskeletal, HENT, Endocrine, Cardiovascular, Eyes, Respiratory, Psychiatric, Allergic/Imm, Heme/Lymph ?Last edited by Silvestre Moment on 08/16/2021  3:22 PM.  ?  ? ? ? ?ALLERGIES ?Allergies  ?Allergen Reactions  ? Sulfasalazine Anaphylaxis  ? Demerol [Meperidine] Nausea Only  ? Garlic   ? Other   ?  shallots  ? Sulfa Antibiotics   ? ? ?PAST MEDICAL HISTORY ?Past Medical History:  ?Diagnosis Date  ? Arthritis   ? BCC (basal cell  carcinoma)   ? BPH (benign prostatic hypertrophy) 2008  ? Cancer [facility]) 2008  ? Prostate  ? Cervical stenosis of spine   ? per patient   ? Complete heart block ([facility])   ? Complex sleep apnea syndrome   ? does not use CPAP  ? Diabetes mellitus without complication ([facility])   ? Diverticulosis   ? ED (erectile dysfunction)   ? GERD (gastroesophageal reflux disease)   ? Hemorrhoids   ? Hernia, inguinal   ? Hyperlipidemia   ? IFG (impaired fasting glucose)   ? Osteopenia   ? Peptic ulcer disease with hemorrhage 1993  ? Shingles   ? Vitamin D deficiency   ? ?Past Surgical History:  ?Procedure Laterality Date  ? BACK SURGERY N/A 1984  ? CATARACT EXTRACTION, BILATERAL    ? HERNIA REPAIR    ? LAMINECTOMY  1984  ? L 5  ? PACEMAKER IMPLANT  12/05/2016  ? MDT Santina Evans DR  MRI conditional PPM implanted for CHB by Dr Vista Deck in [place]  ? PACEMAKER INSERTION  12/05/2016  ? PROSTATE SURGERY  12/2006  ? Robotic  ? TONSILLECTOMY  87 years old  ? TRIGGER FINGER RELEASE  03/01/2012  ? Procedure: MINOR RELEASE TRIGGER FINGER/A-1 PULLEY;  Surgeon: Cammie Sickle., MD;  Location: [facility];  Service: Orthopedics;  Laterality: Left;  long finger  ? TRIGGER FINGER RELEASE Left 02/08/2018  ? Procedure: RELEASE TRIGGER FINGER/A-1 PULLEY LEFT RING FINGER;  Surgeon: Daryll Brod, MD;  Location: [facility];  Service: Orthopedics;  Laterality: Left;  ? ? ?FAMILY HISTORY ?Family History  ?Problem Relation Age of Onset  ? Heart failure Mother   ?     Died age 68.  Apparently had valve surgery.   ? Congenital heart disease Brother   ? ? ?SOCIAL HISTORY ?Social History  ? ?Tobacco Use  ? Smoking status: Never  ? Smokeless tobacco: Never  ?Vaping Use  ? Vaping Use: Never used  ?Substance Use Topics  ? Alcohol use: Not Currently  ? Drug use: No  ? ?  ? ?  ? ?OPHTHALMIC EXAM: ? ?Base Eye Exam   ? ? Visual Acuity (ETDRS)   ? ?   Right Left  ? Dist cc 20/30 +2 20/60 -2  ? ? Correction: Glasses  ? ?  ?  ? ? Tonometry (Tonopen, 3:29 PM)   ? ?   Right Left  ? Pressure 12 13  ? ?  ?  ? ? Pupils   ? ?   Pupils Dark Light Shape React APD  ? Right PERRL 4 3 Round Brisk None  ? Left PERRL 4 3 Round Brisk None  ? ?  ?  ? ? Visual Fields   ? ?   Left Right  ? Restrictions Partial outer superior temporal, inferior temporal, superior nasal, inferior nasal deficiencies Partial outer superior temporal, inferior temporal, superior nasal, inferior nasal deficiencies  ? ?  ?  ? ? Extraocular Movement   ? ?   Right Left  ?  Full Full  ? ?  ?  ? ? Neuro/Psych   ? ? Oriented x3: Yes  ? Mood/Affect: Normal  ? ?  ?  ? ? Dilation   ? ? Both eyes: 1.0% Mydriacyl, 2.5% Phenylephrine @ 3:29 PM  ? ?  ?  ? ?  ? ?Slit Lamp and Fundus Exam   ? ? External Exam   ? ?   Right Left  ?  External Normal Normal  ? ?   ?  ? ? Slit Lamp Exam   ? ?   Right Left  ? Lids/Lashes Normal Normal  ? Conjunctiva/Sclera White and quiet White and quiet  ? Cornea Clear Clear  ? Anterior Chamber Deep and quiet Deep and quiet  ? Iris Round and reactive Round and reactive  ? Lens Posterior chamber intraocular lens Posterior chamber intraocular lens  ? Anterior Vitreous Normal Normal  ? ?  ?  ? ? Fundus Exam   ? ?   Right Left  ? Posterior Vitreous Posterior vitreous detachment Posterior vitreous detachment  ? Disc Peripapillary atrophy Normal  ? C/D Ratio 0.3 0.3  ? Macula Hard drusen, Retinal pigment epithelial mottling, no macular thickening Hard drusen, Retinal pigment epithelial mottling  ? Vessels Normal Normal  ? Periphery Normal Normal  ? ?  ?  ? ?  ? ? ?IMAGING AND PROCEDURES  ?Imaging and Procedures for 08/16/21 ? ?OCT, Retina - OU - Both Eyes   ? ?   ?Right Eye ?Quality was good. Scan locations included subfoveal. Central Foveal Thickness: 310. Progression has been stable. Findings include myopic contour.  ? ?Left Eye ?Quality was good. Scan locations included subfoveal. Central Foveal Thickness: 237. Progression has been stable. Findings include myopic contour.  ? ?Notes ?Myopic contours are stable over time, no active CME yet in her foveal macular schisis from vitreal macular traction evident .  Patient continues on biPAP with great success and no further symptomatology. ? ?  ? ?Color Fundus Photography Optos - OU - Both Eyes   ? ?   ?Right Eye ?Progression has been stable. Disc findings include normal observations.  ? ?Left Eye ?Progression has been stable. Disc findings include normal observations.  ? ?Notes ?Myopic macular contour ? ?  ? ? ?  ?  ? ?  ?ASSESSMENT/PLAN: ? ?Retinal telangiectasia of right eye ?Patient continues with excellent compliance using oxygen assistance at night, formally CPAP now using BiPAP. ? ?No further symptoms of parafoveal scotoma or relative scotoma as had occurred in the past and prior to resumption  of use of CPAP and subsequently BiPAP ? ?On examination however there is perifoveal foveal macular schisis which appears to be from vitreal macular adhesion and tractional change ? ?Vitreomacular adhesion, right ?Fo

## 2021-08-16 NOTE — Assessment & Plan Note (Signed)
Patient continues with excellent compliance using oxygen assistance at night, formally CPAP now using BiPAP. ? ?No further symptoms of parafoveal scotoma or relative scotoma as had occurred in the past and prior to resumption of use of CPAP and subsequently BiPAP ? ?On examination however there is perifoveal foveal macular schisis which appears to be from vitreal macular adhesion and tractional change ?

## 2021-08-18 ENCOUNTER — Other Ambulatory Visit: Payer: Self-pay

## 2021-08-18 ENCOUNTER — Ambulatory Visit (INDEPENDENT_AMBULATORY_CARE_PROVIDER_SITE_OTHER): Payer: Medicare Other | Admitting: Rheumatology

## 2021-08-18 ENCOUNTER — Encounter: Payer: Self-pay | Admitting: Rheumatology

## 2021-08-18 VITALS — BP 121/73 | HR 88 | Ht 73.0 in | Wt 162.6 lb

## 2021-08-18 DIAGNOSIS — I442 Atrioventricular block, complete: Secondary | ICD-10-CM

## 2021-08-18 DIAGNOSIS — R2681 Unsteadiness on feet: Secondary | ICD-10-CM

## 2021-08-18 DIAGNOSIS — M19042 Primary osteoarthritis, left hand: Secondary | ICD-10-CM

## 2021-08-18 DIAGNOSIS — Z79899 Other long term (current) drug therapy: Secondary | ICD-10-CM

## 2021-08-18 DIAGNOSIS — R5383 Other fatigue: Secondary | ICD-10-CM

## 2021-08-18 DIAGNOSIS — M353 Polymyalgia rheumatica: Secondary | ICD-10-CM

## 2021-08-18 DIAGNOSIS — M5136 Other intervertebral disc degeneration, lumbar region: Secondary | ICD-10-CM

## 2021-08-18 DIAGNOSIS — Z8546 Personal history of malignant neoplasm of prostate: Secondary | ICD-10-CM

## 2021-08-18 DIAGNOSIS — M0609 Rheumatoid arthritis without rheumatoid factor, multiple sites: Secondary | ICD-10-CM

## 2021-08-18 DIAGNOSIS — M19041 Primary osteoarthritis, right hand: Secondary | ICD-10-CM | POA: Diagnosis not present

## 2021-08-18 DIAGNOSIS — M8589 Other specified disorders of bone density and structure, multiple sites: Secondary | ICD-10-CM

## 2021-08-18 DIAGNOSIS — M51369 Other intervertebral disc degeneration, lumbar region without mention of lumbar back pain or lower extremity pain: Secondary | ICD-10-CM

## 2021-08-18 DIAGNOSIS — M5134 Other intervertebral disc degeneration, thoracic region: Secondary | ICD-10-CM

## 2021-08-18 DIAGNOSIS — Z8639 Personal history of other endocrine, nutritional and metabolic disease: Secondary | ICD-10-CM

## 2021-08-18 DIAGNOSIS — Z8669 Personal history of other diseases of the nervous system and sense organs: Secondary | ICD-10-CM

## 2021-08-18 DIAGNOSIS — M5030 Other cervical disc degeneration, unspecified cervical region: Secondary | ICD-10-CM

## 2021-08-18 MED ORDER — METHOTREXATE SODIUM CHEMO INJECTION (PF) 50 MG/2ML: INTRAMUSCULAR | 2 refills | Status: DC

## 2021-08-18 NOTE — Patient Instructions (Addendum)
Standing Labs ?We placed an order today for your standing lab work.  ? ?Please have your standing labs drawn in July and every 3 months ? ?If possible, please have your labs drawn 2 weeks prior to your appointment so that the provider can discuss your results at your appointment. ? ?Please note that you may see your imaging and lab results in [facility] before we have reviewed them. ?We may be awaiting multiple results to interpret others before contacting you. ?Please allow our office up to 72 hours to thoroughly review all of the results before contacting the office for clarification of your results. ? ?We have open lab daily: ?Monday through Thursday from 1:30-4:30 PM and Friday from 1:30-4:00 PM ?at the office of Dr. Bo Merino, [facility].   ?Please be advised, all patients with office appointments requiring lab work will take precedent over walk-in lab work.  ?If possible, please come for your lab work on Monday and Friday afternoons, as you may experience shorter wait times. ?The office is located at [address], [place], [place], [place] [address] ?No appointment is necessary.   ?Labs are drawn by [facility]. Please bring your co-pay at the time of your lab draw.  You may receive a bill from [facility] for your lab work. ? ?Please note if you are on Hydroxychloroquine and and an order has been placed for a Hydroxychloroquine level, you will need to have it drawn 4 hours or more after your last dose. ? ?If you wish to have your labs drawn at another location, please call the office 24 hours in advance to send orders. ? ?If you have any questions regarding directions or hours of operation,  ?please call [phone].   ?As a reminder, please drink plenty of water prior to coming for your lab work. Thanks!  ? ?Vaccines ?You are taking a medication(s) that can suppress your immune system.  The following immunizations are recommended: ?Flu annually ?Covid-19  ?Td/Tdap (tetanus, diphtheria, pertussis)  every 10 years ?Pneumonia (Prevnar 15 then Pneumovax 23 at least 1 year apart.  Alternatively, can take Prevnar 20 without needing additional dose) ?Shingrix: 2 doses from 4 weeks to 6 months apart ? ?Please check with your PCP to make sure you are up to date.  ? ?If you have signs or symptoms of an infection or start antibiotics: ?First, call your PCP for workup of your infection. ?Hold your medication through the infection, until you complete your antibiotics, and until symptoms resolve if you take the following: ?Injectable medication (Actemra, Benlysta, Cimzia, Cosentyx, Enbrel, Humira, Kevzara, Orencia, Remicade, Simponi, [place], [place], R[place]) ?Methotrexate ?Leflunomide Jolee Ewing) ?Mycophenolate (Cellcept) ?Roma Kayser, or Rinvoq  ?

## 2021-08-18 NOTE — Progress Notes (Signed)
Please review and sign DEXA order and no print methotrexate prescription. Thanks!  ?

## 2021-09-14 ENCOUNTER — Ambulatory Visit (INDEPENDENT_AMBULATORY_CARE_PROVIDER_SITE_OTHER): Payer: Medicare Other

## 2021-09-14 DIAGNOSIS — I442 Atrioventricular block, complete: Secondary | ICD-10-CM

## 2021-09-14 LAB — CUP PACEART REMOTE DEVICE CHECK
Battery Remaining Longevity: 81 mo
Battery Voltage: 2.98 V
Brady Statistic AP VP Percent: 4.86 %
Brady Statistic AP VS Percent: 0 %
Brady Statistic AS VP Percent: 91.82 %
Brady Statistic AS VS Percent: 3.32 %
Brady Statistic RA Percent Paced: 5.72 %
Brady Statistic RV Percent Paced: 96.68 %
Date Time Interrogation Session: 20230605200900
Implantable Lead Implant Date: 20180827
Implantable Lead Implant Date: 20180827
Implantable Lead Location: 753859
Implantable Lead Location: 753860
Implantable Lead Model: 4076
Implantable Lead Model: 4076
Implantable Pulse Generator Implant Date: 20180827
Lead Channel Impedance Value: 323 Ohm
Lead Channel Impedance Value: 380 Ohm
Lead Channel Impedance Value: 380 Ohm
Lead Channel Impedance Value: 437 Ohm
Lead Channel Pacing Threshold Amplitude: 0.625 V
Lead Channel Pacing Threshold Amplitude: 0.75 V
Lead Channel Pacing Threshold Pulse Width: 0.4 ms
Lead Channel Pacing Threshold Pulse Width: 0.4 ms
Lead Channel Sensing Intrinsic Amplitude: 14.75 mV
Lead Channel Sensing Intrinsic Amplitude: 14.75 mV
Lead Channel Sensing Intrinsic Amplitude: 4.875 mV
Lead Channel Sensing Intrinsic Amplitude: 4.875 mV
Lead Channel Setting Pacing Amplitude: 1.5 V
Lead Channel Setting Pacing Amplitude: 2.5 V
Lead Channel Setting Pacing Pulse Width: 0.4 ms
Lead Channel Setting Sensing Sensitivity: 4 mV

## 2021-09-29 NOTE — Progress Notes (Signed)
Remote pacemaker transmission.   

## 2021-10-07 ENCOUNTER — Other Ambulatory Visit: Payer: Self-pay | Admitting: *Deleted

## 2021-10-07 ENCOUNTER — Telehealth: Payer: Self-pay | Admitting: Rheumatology

## 2021-10-07 DIAGNOSIS — M0609 Rheumatoid arthritis without rheumatoid factor, multiple sites: Secondary | ICD-10-CM

## 2021-10-07 DIAGNOSIS — Z79899 Other long term (current) drug therapy: Secondary | ICD-10-CM

## 2021-10-07 MED ORDER — METHOTREXATE 2.5 MG PO TABS: 12.5000 mg | ORAL_TABLET | ORAL | 0 refills | Status: DC

## 2021-10-07 NOTE — Telephone Encounter (Signed)
Patient left a voicemail stating he has tried to get his Methotrexate injection medication refilled for a long time and the pharmacies don't have it.  Patient states he is out of medication and requested a prescription (tablet form) be sent to [facility] at [facility].  Patient requested a return call to confirm you received this message.

## 2021-10-07 NOTE — Telephone Encounter (Signed)
Spoke with patient - he is requesting going back to tablets since he has been trying for two weeks to locate MTX vial due to shortage.   He currently injects 0.62m of MTX SQ weekly =  12.'5mg'$  once weekly.  [facility] Patient advised to take all 5 tablets on same day and at the same time.  He is due for updated labs on 11/02/21  DKnox Saliva PharmD, MPH, BCPS, CPP Clinical Pharmacist (Rheumatology and Pulmonology)

## 2021-11-08 ENCOUNTER — Other Ambulatory Visit: Payer: Self-pay | Admitting: Physician Assistant

## 2021-11-08 DIAGNOSIS — M0609 Rheumatoid arthritis without rheumatoid factor, multiple sites: Secondary | ICD-10-CM

## 2021-11-08 DIAGNOSIS — Z79899 Other long term (current) drug therapy: Secondary | ICD-10-CM

## 2021-11-08 NOTE — Telephone Encounter (Signed)
Next Visit: 02/01/2022  Last Visit: 08/18/2021  Last Fill: 10/07/2021 (30 day supply)  DX: Rheumatoid arthritis of multiple sites without rheumatoid factor   Current Dose per phone note 10/07/2021: MTX 5 tablets q weekly   Labs: 08/03/2021 CBC WNL.  Glucose is 148.  Rest of CMP WNL.   Left message to advise patient he is due to update labs.   Okay to refill MTX?

## 2021-11-09 ENCOUNTER — Telehealth: Payer: Self-pay | Admitting: Rheumatology

## 2021-11-09 NOTE — Telephone Encounter (Signed)
Spoke with patient and advised patient we sent a 30 day supply of medication to the pharmacy. Patient advised his last set of labs were 08/03/2021. Patient advised he just recently became due for labs. Patient states he will come to the office to update labs.

## 2021-11-09 NOTE — Telephone Encounter (Signed)
Patient left a voicemail (11/08/21 at 5:01 pm) stating he was returning a call to the office regarding his Methotrexate refill.  Patient states the message was about standing labs.  Patient states he thought he was up to date with his labwork.  Patient states he is out of medication and requested a return call.

## 2021-11-10 ENCOUNTER — Other Ambulatory Visit: Payer: Self-pay | Admitting: *Deleted

## 2021-11-10 DIAGNOSIS — Z79899 Other long term (current) drug therapy: Secondary | ICD-10-CM

## 2021-11-11 LAB — CBC WITH DIFFERENTIAL/PLATELET
Absolute Monocytes: 419 cells/uL (ref 200–950)
Basophils Absolute: 9 cells/uL (ref 0–200)
Basophils Relative: 0.2 %
Eosinophils Absolute: 28 cells/uL (ref 15–500)
Eosinophils Relative: 0.6 %
HCT: 41.9 % (ref 38.5–50.0)
Hemoglobin: 14.4 g/dL (ref 13.2–17.1)
Lymphs Abs: 1099 cells/uL (ref 850–3900)
MCH: 32.5 pg (ref 27.0–33.0)
MCHC: 34.4 g/dL (ref 32.0–36.0)
MCV: 94.6 fL (ref 80.0–100.0)
MPV: 9.6 fL (ref 7.5–12.5)
Monocytes Relative: 9.1 %
Neutro Abs: 3045 cells/uL (ref 1500–7800)
Neutrophils Relative %: 66.2 %
Platelets: 250 10*3/uL (ref 140–400)
RBC: 4.43 10*6/uL (ref 4.20–5.80)
RDW: 12.4 % (ref 11.0–15.0)
Total Lymphocyte: 23.9 %
WBC: 4.6 10*3/uL (ref 3.8–10.8)

## 2021-11-11 LAB — COMPLETE METABOLIC PANEL WITH GFR
AG Ratio: 1.7 (calc) (ref 1.0–2.5)
ALT: 11 U/L (ref 9–46)
AST: 6 U/L — ABNORMAL LOW (ref 10–35)
Albumin: 4.3 g/dL (ref 3.6–5.1)
Alkaline phosphatase (APISO): 66 U/L (ref 35–144)
BUN: 17 mg/dL (ref 7–25)
CO2: 31 mmol/L (ref 20–32)
Calcium: 9.5 mg/dL (ref 8.6–10.3)
Chloride: 99 mmol/L (ref 98–110)
Creat: 0.76 mg/dL (ref 0.70–1.22)
Globulin: 2.6 g/dL (calc) (ref 1.9–3.7)
Glucose, Bld: 160 mg/dL — ABNORMAL HIGH (ref 65–99)
Potassium: 4.5 mmol/L (ref 3.5–5.3)
Sodium: 137 mmol/L (ref 135–146)
Total Bilirubin: 0.5 mg/dL (ref 0.2–1.2)
Total Protein: 6.9 g/dL (ref 6.1–8.1)
eGFR: 89 mL/min/{1.73_m2} (ref >=60)

## 2021-11-11 NOTE — Progress Notes (Signed)
CMP is normal except glucose is elevated.  CBC is normal.

## 2021-11-18 ENCOUNTER — Telehealth: Payer: Self-pay | Admitting: Internal Medicine

## 2021-11-18 NOTE — Telephone Encounter (Signed)
Hi Dr. Hilarie Fredrickson,   We received a  referral for patient to be evaluated for Dysphagia. He has GI history with Dr. Earlean Shawl who is now retired. The patient is specifically requesting you to continue his GI care. Records are available in Epic for you to review and advise on scheduling.    Thank you

## 2021-11-26 ENCOUNTER — Encounter: Payer: Self-pay | Admitting: Internal Medicine

## 2021-11-26 ENCOUNTER — Ambulatory Visit (INDEPENDENT_AMBULATORY_CARE_PROVIDER_SITE_OTHER): Payer: Medicare Other | Admitting: Internal Medicine

## 2021-11-26 VITALS — BP 100/60 | HR 95 | Ht 73.5 in | Wt 157.0 lb

## 2021-11-26 DIAGNOSIS — R0989 Other specified symptoms and signs involving the circulatory and respiratory systems: Secondary | ICD-10-CM

## 2021-11-26 DIAGNOSIS — R490 Dysphonia: Secondary | ICD-10-CM | POA: Diagnosis not present

## 2021-11-26 DIAGNOSIS — R1319 Other dysphagia: Secondary | ICD-10-CM

## 2021-11-26 NOTE — Progress Notes (Signed)
Patient ID: Jeffery [name], male   DOB: 29-Dec-1934, 87 y.o.   MRN: [id number] HPI: Jeffery [name] is an 87 year old male with a past medical history of remote H. pylori associated duodenal ulcer (1992), H. pylori treatment previously, history of remote colon polyp, history of diverticulosis and colonic diverticulitis, internal hemorrhoids, sleep apnea on BiPAP, history of prostate cancer, hyperlipidemia, diabetes, rheumatoid arthritis on hydroxychloroquine and methotrexate who is seen to evaluate dysphagia symptom.  He is here alone today.  He has prior GI history with Dr. Earlean Shawl.  I have records showing colonoscopy performed on 10/08/2007.  Colon polyp removed by hot biopsy measuring 7 mm.  Extensive left colonic diverticulosis and grade 2 internal hemorrhoids.  Pathology records not immediately available.  He reports that for a year but more prominent over the last several months he has had increasing issues associated with swallowing.  He notes that this is more with food clearance through the esophagus rather than truly swallowing.  Food sticks when he is eating and this is predominantly breads and cereals.  He has hoarseness and throat clearing symptom which is new.  He is not feeling traditional heartburn.  Symptoms are not worse at night.  He denies choking though he does occasionally cough after swallowing.  No nausea or vomiting.  No odynophagia symptom.  No abdominal pain.  Bowel movements regular.  No blood in stool or melena.  No known family history of GI tract malignancy. He is retired.  Grew up in [place] and went to [employer] for college.   Past Medical History:  Diagnosis Date   Arthritis    BCC (basal cell carcinoma)    BPH (benign prostatic hypertrophy) 2008   Cancer [facility]) 2008   Prostate   Cervical stenosis of spine    per patient    Complete heart block ([facility])    Complex sleep apnea syndrome    does not use CPAP   Diabetes mellitus without complication ([facility])     Diverticulosis    ED (erectile dysfunction)    GERD (gastroesophageal reflux disease)    Hemorrhoids    Hernia, inguinal    Hyperlipidemia    IFG (impaired fasting glucose)    Osteopenia    Peptic ulcer disease with hemorrhage 1993   Shingles    Vitamin D deficiency     Past Surgical History:  Procedure Laterality Date   BACK SURGERY N/A 1984   CATARACT EXTRACTION, BILATERAL     HERNIA REPAIR     LAMINECTOMY  1984   L 5   PACEMAKER IMPLANT  12/05/2016   MDT Azure XT DR MRI conditional PPM implanted for CHB by Dr Vista Deck in [place]  12/05/2016   PROSTATE SURGERY  12/2006   Robotic   TONSILLECTOMY  87 years old   [place]  03/01/2012   Procedure: MINOR RELEASE TRIGGER FINGER/A-1 PULLEY;  Surgeon: Cammie Sickle., MD;  Location: [facility];  Service: Orthopedics;  Laterality: Left;  long finger   TRIGGER FINGER RELEASE Left 02/08/2018   Procedure: RELEASE TRIGGER FINGER/A-1 PULLEY LEFT RING FINGER;  Surgeon: Daryll Brod, MD;  Location: [facility];  Service: Orthopedics;  Laterality: Left;    Outpatient Medications Prior to Visit  Medication Sig Dispense Refill   acetaminophen (TYLENOL) 500 MG tablet Take 500 mg by mouth every 6 (six) hours as needed.     Cholecalciferol (VITAMIN D) 2000 units CAPS Take by mouth daily.  Cholecalciferol (VITAMIN D3) 1.25 MG (50000 UT) CAPS TAKE 1 CAPSULE BY MOUTH ONCE PER MONTH 12 capsule 0   cholecalciferol (VITAMIN D3) 25 MCG (1000 UNIT) tablet Take 1,000 Units by mouth daily.     diclofenac sodium (VOLTAREN) 1 % GEL Apply 2-4 grams to affected joint, up to 4 times daily. (Patient taking differently: Apply 2-4 grams to affected joint, up to 4 times daily as needed) 751 g 2   folic acid (FOLVITE) 1 MG tablet TAKE 1 TABLET BY MOUTH ONCE DAILY 90 tablet 3   hydroxychloroquine (PLAQUENIL) 200 MG tablet TAKE 1 TABLET(200 MG) BY MOUTH TWICE DAILY 180 tablet 0   metFORMIN (GLUCOPHAGE)  500 MG tablet Take 500 mg daily by mouth.      methotrexate 2.5 MG tablet TAKE 5 TABLETS BY MOUTH ONCE A WEEK. CAUTION; CHEMOTHERAPY. PROTECT FROM LIGHT 20 tablet 0   omega-3 acid ethyl esters (LOVAZA) 1 g capsule TAKE 1 CAPSULE BY MOUTH THREE TIMES A WEEK 36 capsule 0   ONE TOUCH ULTRA TEST test strip TEST BLOOD SUGAR BID AS DIRECTED  2   TUBERCULIN SYR 1CC/27GX1/2" (SAFETY-LOK TB SYRINGE 27GX.5") 27G X 1/2" 1 ML MISC USE TO INJECT METHOTREXATE 12 each 3   vitamin E 100 UNIT capsule Take 100 Units by mouth daily.     zolpidem (AMBIEN) 5 MG tablet Take 5 mg by mouth as needed.     No facility-administered medications prior to visit.    Allergies  Allergen Reactions   Sulfasalazine Anaphylaxis   Demerol [Meperidine] Nausea Only   Garlic    Other     SHALLOTS   Sulfa Antibiotics     Family History  Problem Relation Age of Onset   Heart failure Mother        Died age 20.  Apparently had valve surgery.    Congenital heart disease Brother     Social History   Tobacco Use   Smoking status: Never    Passive exposure: Never   Smokeless tobacco: Never  Vaping Use   Vaping Use: Never used  Substance Use Topics   Alcohol use: Not Currently   Drug use: No    ROS: As per history of present illness, otherwise negative  BP 100/60   Pulse 95   Ht 6' 1.5" (1.867 m)   Wt 157 lb (71.2 kg)   BMI 20.43 kg/m  Gen: awake, alert, NAD HEENT: anicteric, op clear CV: RRR, no mrg Pulm: CTA b/l Abd: soft, NT/ND, +BS throughout Ext: no c/c/e Neuro: nonfocal   RELEVANT LABS AND IMAGING: CBC    Component Value Date/Time   WBC 4.6 11/10/2021 1522   RBC 4.43 11/10/2021 1522   HGB 14.4 11/10/2021 1522   HGB 15.5 03/19/2019 1537   HCT 41.9 11/10/2021 1522   HCT 45.7 03/19/2019 1537   PLT 250 11/10/2021 1522   PLT 262 03/19/2019 1537   MCV 94.6 11/10/2021 1522   MCV 94 03/19/2019 1537   MCH 32.5 11/10/2021 1522   MCHC 34.4 11/10/2021 1522   RDW 12.4 11/10/2021 1522   RDW 12.1  03/19/2019 1537   LYMPHSABS 1,099 11/10/2021 1522   LYMPHSABS 2.2 03/19/2019 1537   MONOABS 441 10/08/2016 1043   EOSABS 28 11/10/2021 1522   EOSABS 0.2 03/19/2019 1537   BASOSABS 9 11/10/2021 1522   BASOSABS 0.0 03/19/2019 1537    CMP     Component Value Date/Time   NA 137 11/10/2021 1522   NA 142 03/19/2019 1537  K 4.5 11/10/2021 1522   CL 99 11/10/2021 1522   CO2 31 11/10/2021 1522   GLUCOSE 160 (H) 11/10/2021 1522   BUN 17 11/10/2021 1522   BUN 16 03/19/2019 1537   CREATININE 0.76 11/10/2021 1522   CALCIUM 9.5 11/10/2021 1522   PROT 6.9 11/10/2021 1522   PROT 7.2 03/19/2019 1537   ALBUMIN 4.7 (H) 03/19/2019 1537   AST 6 (L) 11/10/2021 1522   ALT 11 11/10/2021 1522   ALKPHOS 85 03/19/2019 1537   BILITOT 0.5 11/10/2021 1522   BILITOT 0.5 03/19/2019 1537   GFRNONAA 88 08/10/2020 1538   GFRAA 102 08/10/2020 1538    ASSESSMENT/PLAN: 87 year old male with a past medical history of remote H. pylori associated duodenal ulcer (1992), H. pylori treatment previously, history of remote colon polyp, history of diverticulosis and colonic diverticulitis, internal hemorrhoids, sleep apnea on BiPAP, history of prostate cancer, hyperlipidemia, diabetes, rheumatoid arthritis on hydroxychloroquine and methotrexate who is seen to evaluate dysphagia symptom.   Dysphagia/throat clearing/hoarseness --symptoms suspicious for GERD though he does not have traditional pyrosis.  We talked about esophageal stricture but also dysmotility, the latter being a bit more common with age.  However given the onset of symptoms I am less suspicious for dysmotility solely.  I have recommended the following evaluation -- Barium esophagram with tablet -- Followed by upper endoscopy in the [facility]; we reviewed the risk, benefits and alternatives and he is agreeable and wishes to proceed.  We did discuss that this test may be canceled if barium esophagram indicates we should go another direction. -- I am not  recommending acid suppression at this time.  2.  CRC screening --discontinued by primary care and Dr. Earlean Shawl secondary to age    AV:[id number], Jenny Reichmann, [place],  [place] [id number]

## 2021-11-26 NOTE — Patient Instructions (Addendum)
You have been scheduled for a Barium Esophogram at [facility]iology (1st floor of the hospital) on Tuesday 12/07/21 at 9:30 am. Please arrive 15 minutes prior to your appointment for registration. Make certain not to have anything to eat or drink 3 hours prior to your test. If you need to reschedule for any reason, please contact radiology at [phone] to do so. __________________________________________________________________ A barium swallow is an examination that concentrates on views of the esophagus. This tends to be a double contrast exam (barium and two liquids which, when combined, create a gas to distend the wall of the oesophagus) or single contrast (non-ionic iodine based). The study is usually tailored to your symptoms so a good history is essential. Attention is paid during the study to the form, structure and configuration of the esophagus, looking for functional disorders (such as aspiration, dysphagia, achalasia, motility and reflux) EXAMINATION You may be asked to change into a gown, depending on the type of swallow being performed. A radiologist and radiographer will perform the procedure. The radiologist will advise you of the type of contrast selected for your procedure and direct you during the exam. You will be asked to stand, sit or lie in several different positions and to hold a small amount of fluid in your mouth before being asked to swallow while the imaging is performed .In some instances you may be asked to swallow barium coated marshmallows to assess the motility of a solid food bolus. The exam can be recorded as a digital or video fluoroscopy procedure. POST PROCEDURE It will take 1-2 days for the barium to pass through your system. To facilitate this, it is important, unless otherwise directed, to increase your fluids for the next 24-48hrs and to resume your normal diet.  This test typically takes about 30 minutes to  perform. __________________________________________________________________________________  If you are age 50 or older, your body mass index should be between 23-30. Your Body mass index is 20.43 kg/m. If this is out of the aforementioned range listed, please consider follow up with your Primary Care Provider.  If you are age 59 or younger, your body mass index should be between 19-25. Your Body mass index is 20.43 kg/m. If this is out of the aformentioned range listed, please consider follow up with your Primary Care Provider.   ________________________________________________________  The  GI providers would like to encourage you to use [facility] to communicate with providers for non-urgent requests or questions.  Due to long hold times on the telephone, sending your provider a message by [facility] may be a faster and more efficient way to get a response.  Please allow 48 business hours for a response.  Please remember that this is for non-urgent requests.  _______________________________________________________

## 2021-12-07 ENCOUNTER — Other Ambulatory Visit: Payer: Self-pay | Admitting: Internal Medicine

## 2021-12-07 ENCOUNTER — Ambulatory Visit (HOSPITAL_COMMUNITY)
Admission: RE | Admit: 2021-12-07 | Discharge: 2021-12-07 | Disposition: A | Discharge status: Home / Self Care | Payer: Medicare Other | Source: Ambulatory Visit | Attending: Internal Medicine | Admitting: Internal Medicine

## 2021-12-07 DIAGNOSIS — R1319 Other dysphagia: Secondary | ICD-10-CM | POA: Diagnosis present

## 2021-12-08 ENCOUNTER — Other Ambulatory Visit: Payer: Self-pay | Admitting: Physician Assistant

## 2021-12-08 DIAGNOSIS — Z79899 Other long term (current) drug therapy: Secondary | ICD-10-CM

## 2021-12-08 DIAGNOSIS — M0609 Rheumatoid arthritis without rheumatoid factor, multiple sites: Secondary | ICD-10-CM

## 2021-12-08 NOTE — Telephone Encounter (Signed)
Next Visit: 02/01/2022  Last Visit: 08/18/2021  Last Fill: 11/08/2021  DX: Rheumatoid arthritis of multiple sites without rheumatoid factor   Current Dose per office note 10/07/2021, MTX 5 tablets q weekly   Labs: 11/10/2021 CMP is normal except glucose is elevated.  CBC is normal.  Okay to refill MTX?

## 2021-12-09 ENCOUNTER — Other Ambulatory Visit: Payer: Self-pay | Admitting: Physician Assistant

## 2021-12-09 ENCOUNTER — Other Ambulatory Visit: Payer: Self-pay

## 2021-12-09 DIAGNOSIS — T17998A Other foreign object in respiratory tract, part unspecified causing other injury, initial encounter: Secondary | ICD-10-CM

## 2021-12-09 NOTE — Telephone Encounter (Signed)
Next Visit: 02/01/2022   Last Visit: 08/18/2021   Last Fill: 10/26/2020  DX: Rheumatoid arthritis of multiple sites without rheumatoid factor    Current Dose per phone note 6/88/6484: folic acid 1 mg po daily  Okay to refill Folic Acid?

## 2021-12-13 LAB — CUP PACEART REMOTE DEVICE CHECK
Battery Remaining Longevity: 79 mo
Battery Voltage: 2.98 V
Brady Statistic AP VP Percent: 1.45 %
Brady Statistic AP VS Percent: 0 %
Brady Statistic AS VP Percent: 96.33 %
Brady Statistic AS VS Percent: 2.22 %
Brady Statistic RA Percent Paced: 1.83 %
Brady Statistic RV Percent Paced: 97.78 %
Date Time Interrogation Session: 20230904210832
Implantable Lead Implant Date: 20180827
Implantable Lead Implant Date: 20180827
Implantable Lead Location: 753859
Implantable Lead Location: 753860
Implantable Lead Model: 4076
Implantable Lead Model: 4076
Implantable Pulse Generator Implant Date: 20180827
Lead Channel Impedance Value: 342 Ohm
Lead Channel Impedance Value: 380 Ohm
Lead Channel Impedance Value: 399 Ohm
Lead Channel Impedance Value: 456 Ohm
Lead Channel Pacing Threshold Amplitude: 0.625 V
Lead Channel Pacing Threshold Amplitude: 0.625 V
Lead Channel Pacing Threshold Pulse Width: 0.4 ms
Lead Channel Pacing Threshold Pulse Width: 0.4 ms
Lead Channel Sensing Intrinsic Amplitude: 16.875 mV
Lead Channel Sensing Intrinsic Amplitude: 16.875 mV
Lead Channel Sensing Intrinsic Amplitude: 5.875 mV
Lead Channel Sensing Intrinsic Amplitude: 5.875 mV
Lead Channel Setting Pacing Amplitude: 1.5 V
Lead Channel Setting Pacing Amplitude: 2.5 V
Lead Channel Setting Pacing Pulse Width: 0.4 ms
Lead Channel Setting Sensing Sensitivity: 4 mV

## 2021-12-14 ENCOUNTER — Ambulatory Visit (INDEPENDENT_AMBULATORY_CARE_PROVIDER_SITE_OTHER): Payer: Medicare Other

## 2021-12-14 DIAGNOSIS — I442 Atrioventricular block, complete: Secondary | ICD-10-CM

## 2021-12-27 ENCOUNTER — Ambulatory Visit (AMBULATORY_SURGERY_CENTER): Payer: Medicare Other | Admitting: Internal Medicine

## 2021-12-27 ENCOUNTER — Encounter: Payer: Self-pay | Admitting: Internal Medicine

## 2021-12-27 VITALS — BP 125/65 | HR 69 | Temp 97.1°F | Resp 17 | Ht 72.0 in | Wt 157.0 lb

## 2021-12-27 DIAGNOSIS — R1319 Other dysphagia: Secondary | ICD-10-CM

## 2021-12-27 DIAGNOSIS — B3781 Candidal esophagitis: Secondary | ICD-10-CM | POA: Diagnosis not present

## 2021-12-27 DIAGNOSIS — R1310 Dysphagia, unspecified: Secondary | ICD-10-CM

## 2021-12-27 DIAGNOSIS — B3749 Other urogenital candidiasis: Secondary | ICD-10-CM

## 2021-12-27 DIAGNOSIS — K2970 Gastritis, unspecified, without bleeding: Secondary | ICD-10-CM | POA: Diagnosis not present

## 2021-12-27 DIAGNOSIS — K209 Esophagitis, unspecified without bleeding: Secondary | ICD-10-CM

## 2021-12-27 MED ORDER — SODIUM CHLORIDE 0.9 % IV SOLN: 500.0000 mL | Freq: Once | INTRAVENOUS | Status: DC

## 2021-12-27 MED ORDER — FLUCONAZOLE 200 MG PO TABS: ORAL_TABLET | ORAL | 0 refills | Status: DC

## 2021-12-27 NOTE — Progress Notes (Signed)
See office note dated 11/26/2021 for details and current H&P  Patient presenting for upper endoscopy to evaluate dysphagia symptom, abnormal barium esophagram showing GERD  He remains appropriate for EGD here today.

## 2021-12-27 NOTE — Progress Notes (Signed)
Called to room to assist during endoscopic procedure.  Patient ID and intended procedure confirmed with present staff. Received instructions for my participation in the procedure from the performing physician.  

## 2021-12-27 NOTE — Patient Instructions (Signed)
Resume previous medications.  Await results for final recommendations.  Handouts on findings given to patient.     YOU HAD AN ENDOSCOPIC PROCEDURE TODAY AT [facility]:   Refer to the procedure report that was given to you for any specific questions about what was found during the examination.  If the procedure report does not answer your questions, please call your gastroenterologist to clarify.  If you requested that your care partner not be given the details of your procedure findings, then the procedure report has been included in a sealed envelope for you to review at your convenience later.  YOU SHOULD EXPECT: Some feelings of bloating in the abdomen. Passage of more gas than usual.  Walking can help get rid of the air that was put into your GI tract during the procedure and reduce the bloating. If you had a lower endoscopy (such as a colonoscopy or flexible sigmoidoscopy) you may notice spotting of blood in your stool or on the toilet paper. If you underwent a bowel prep for your procedure, you may not have a normal bowel movement for a few days.  Please Note:  You might notice some irritation and congestion in your nose or some drainage.  This is from the oxygen used during your procedure.  There is no need for concern and it should clear up in a day or so.  SYMPTOMS TO REPORT IMMEDIATELY:   Following upper endoscopy (EGD)  Vomiting of blood or coffee ground material  New chest pain or pain under the shoulder blades  Painful or persistently difficult swallowing  New shortness of breath  Fever of 100F or higher  Black, tarry-looking stools  For urgent or emergent issues, a gastroenterologist can be reached at any hour by calling [phone]. Do not use MyChart messaging for urgent concerns.    DIET:  We do recommend a small meal at first, but then you may proceed to your regular diet.  Drink plenty of fluids but you should avoid alcoholic beverages for 24  hours.  ACTIVITY:  You should plan to take it easy for the rest of today and you should NOT DRIVE or use heavy machinery until tomorrow (because of the sedation medicines used during the test).    FOLLOW UP: Our staff will call the number listed on your records the next business day following your procedure.  We will call around 7:15- 8:00 am to check on you and address any questions or concerns that you may have regarding the information given to you following your procedure. If we do not reach you, we will leave a message.     If any biopsies were taken you will be contacted by phone or by letter within the next 1-3 weeks.  Please call us at [phone] if you have not heard about the biopsies in 3 weeks.    SIGNATURES/CONFIDENTIALITY: You and/or your care partner have signed paperwork which will be entered into your electronic medical record.  These signatures attest to the fact that that the information above on your After Visit Summary has been reviewed and is understood.  Full responsibility of the confidentiality of this discharge information lies with you and/or your care-partner.

## 2021-12-27 NOTE — Progress Notes (Signed)
Sedate, gd SR's, VSS, Report to RN

## 2021-12-27 NOTE — Op Note (Signed)
[facility] Patient Name: Jeffery [name] Procedure Date: 12/27/2021 2:36 PM MRN: [id number] Endoscopist: Jerene Bears , MD Age: 87 Referring MD:  Date of Birth: 06-Apr-1935 Gender: Male Account #: [id number] Procedure:                Upper GI endoscopy Indications:              Dysphagia and LPR symptoms, Abnormal                            cine-esophagram (frank aspiration and GERD; no                            stricture and barium tablet passed without delay) Medicines:                Monitored Anesthesia Care Procedure:                Pre-Anesthesia Assessment:                           - Prior to the procedure, a History and Physical                            was performed, and patient medications and                            allergies were reviewed. The patient's tolerance of                            previous anesthesia was also reviewed. The risks                            and benefits of the procedure and the sedation                            options and risks were discussed with the patient.                            All questions were answered, and informed consent                            was obtained. Prior Anticoagulants: The patient has                            taken no previous anticoagulant or antiplatelet                            agents. ASA Grade Assessment: III - A patient with                            severe systemic disease. After reviewing the risks                            and benefits, the patient was deemed in  satisfactory condition to undergo the procedure.                           After obtaining informed consent, the endoscope was                            passed under direct vision. Throughout the                            procedure, the patient's blood pressure, pulse, and                            oxygen saturations were monitored continuously. The                            Endoscope was  introduced through the mouth, and                            advanced to the second part of duodenum. The upper                            GI endoscopy was accomplished without difficulty.                            The patient tolerated the procedure well. Scope In: Scope Out: Findings:                 Diffuse, white plaques were found in the upper                            third of the esophagus and in the middle third of                            the esophagus.                           No endoscopic abnormality was evident in the                            esophagus to explain the patient's complaint of                            dysphagia. Several biopsies were obtained with cold                            forceps for evaluation for microscopic esophagitis                            in a targeted manner in the lower third of the                            esophagus.                           Mild inflammation characterized by  congestion                            (edema), erythema and granularity was found in the                            gastric body and in the gastric antrum. Biopsies                            were taken with a cold forceps for histology and                            Helicobacter pylori testing.                           The examined duodenum was normal. Complications:            No immediate complications. Estimated Blood Loss:     Estimated blood loss was minimal. Impression:               - Esophageal plaques were found, consistent with                            candidiasis.                           - No endoscopic esophageal abnormality to explain                            patient's dysphagia. Distal esophageal biopsies to                            examine for acid reflux related inflammation.                           - Mild gastritis. Biopsied to exclude H. Pylori                            infection.                           - Normal examined  duodenum. Recommendation:           - Patient has a contact number available for                            emergencies. The signs and symptoms of potential                            delayed complications were discussed with the                            patient. Return to normal activities tomorrow.                            Written discharge instructions were provided to the  patient.                           - Resume previous diet.                           - Continue present medications.                           - Fluconazole 400 mg x 1 day, 200 mg 13 days for                            Candida esophagitis.                           - Await pathology results. If esophageal biopsies                            show acid reflux related inflammation then consider                            low dose PPI.                           - Office follow-up in 3 months. Jerene Bears, MD 12/27/2021 3:09:48 PM This report has been signed electronically.

## 2021-12-27 NOTE — Progress Notes (Signed)
Vitals-CW  Pt's states no medical or surgical changes since previsit or office visit. 

## 2021-12-28 ENCOUNTER — Telehealth: Payer: Self-pay

## 2021-12-28 NOTE — Telephone Encounter (Signed)
  Follow up Call-     12/27/2021    2:11 PM 12/27/2021    2:07 PM  Call back number  Post procedure Call Back phone  # [phone]   Permission to leave phone message  Yes     Left message

## 2021-12-29 ENCOUNTER — Other Ambulatory Visit: Payer: Self-pay

## 2021-12-29 ENCOUNTER — Ambulatory Visit: Payer: Medicare Other | Attending: Internal Medicine

## 2021-12-29 DIAGNOSIS — R1313 Dysphagia, pharyngeal phase: Secondary | ICD-10-CM | POA: Diagnosis present

## 2021-12-29 DIAGNOSIS — R471 Dysarthria and anarthria: Secondary | ICD-10-CM | POA: Insufficient documentation

## 2021-12-29 DIAGNOSIS — R1310 Dysphagia, unspecified: Secondary | ICD-10-CM | POA: Diagnosis present

## 2021-12-29 NOTE — Therapy (Signed)
OUTPATIENT SPEECH LANGUAGE PATHOLOGY SWALLOW EVALUATION  ("BEDSIDE" SWALLOW EVALUATION)   Patient Name: Jeffery [name] MRN: [id number] DOB:06-07-1934, 87 y.o., male Today's Date: 12/29/2021  PCP: Shon Baton, MDPCP  REFERRING PROVIDER: Jerene Bears, MD    End of Session - 12/29/21 1236     Visit Number 1    Number of Visits 17    Date for SLP Re-Evaluation 02/25/22    SLP Start Time 0934    SLP Stop Time  5361    SLP Time Calculation (min) 41 min    Activity Tolerance Patient tolerated treatment well             Past Medical History:  Diagnosis Date   Arthritis    Autoimmune disorder ([place])    BCC (basal cell carcinoma)    BPH (benign prostatic hypertrophy) 2008   Cancer [facility]) 2008   Prostate   Cervical stenosis of spine    per patient    Complete heart block (HCC)    Complex sleep apnea syndrome    does not use CPAP   Diabetes mellitus without complication ([place])    Diverticulosis    ED (erectile dysfunction)    GERD (gastroesophageal reflux disease)    Hemorrhoids    Hernia, inguinal    Hyperlipidemia    IFG (impaired fasting glucose)    Osteopenia    Peptic ulcer disease with hemorrhage 1993   Shingles    Sleep apnea    Vitamin D deficiency    Past Surgical History:  Procedure Laterality Date   BACK SURGERY N/A 1984   CATARACT EXTRACTION, BILATERAL     HERNIA REPAIR     LAMINECTOMY  1984   L 5   PACEMAKER IMPLANT  12/05/2016   MDT Azure XT DR MRI conditional PPM implanted for CHB by Dr Vista Deck in [place]  12/05/2016   PROSTATE SURGERY  12/2006   Robotic   TONSILLECTOMY  87 years old   [place]  03/01/2012   Procedure: MINOR RELEASE TRIGGER FINGER/A-1 PULLEY;  Surgeon: Cammie Sickle., MD;  Location: [facility];  Service: Orthopedics;  Laterality: Left;  long finger   TRIGGER FINGER RELEASE Left 02/08/2018   Procedure: RELEASE TRIGGER FINGER/A-1 PULLEY LEFT RING FINGER;  Surgeon: Daryll Brod, MD;  Location: [facility];  Service: Orthopedics;  Laterality: Left;   Patient Active Problem List   Diagnosis Date Noted   Vitreomacular adhesion, right 08/16/2021   Macular retinoschisis of right eye 08/16/2021   Pulmonary nodules 07/04/2021   Essential hypertension 07/04/2021   Sleep apnea 06/28/2020   Dyslipidemia 06/28/2020   Memory changes 04/16/2020   Pacemaker ECG pattern 09/20/2019   Treatment-emergent central sleep apnea 09/20/2019   Round hole of right eye 08/26/2019   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/26/2019   Cystoid macular edema of right eye 08/26/2019   Pseudophakia of both eyes 08/26/2019   Obstructive sleep apnea of adult 08/26/2019   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) 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 08/24/2019   Educated about COVID-19 virus infection 10/08/2018   Aortic root dilatation ([place]) 10/08/2018   Mitral annular calcification 10/08/2018   Neuropathy 08/21/2017   Hydroxychloroquine causing adverse effect in therapeutic use 08/21/2017   Symptomatic bradycardia 02/23/2017   Status cardiac pacemaker 02/23/2017   CSA (central sleep apnea) 02/23/2017   Heart block AV complete ([place]) 01/18/2017   Vitamin D deficiency 02/08/2016   Trigger finger,  left middle finger 02/08/2016   Long-term use of Plaquenil 02/08/2016   Bursitis of right shoulder 02/08/2016   DDD (degenerative disc disease), lumbar 02/08/2016   DDD (degenerative disc disease), thoracic 02/08/2016   Rheumatoid arthritis of multiple sites without rheumatoid factor ([place]) 11/28/2015    Class: Chronic   Other secondary osteoarthritis of both hands 11/28/2015    Class: Chronic   PMR (polymyalgia rheumatica) ([place]) 11/28/2015    Class: Chronic   Osteoporosis 11/28/2015    Class: Chronic   Prostate cancer ([place]) 11/28/2015    Class: History of   Duodenal ulcer 11/28/2015    Class: Chronic   High cholesterol 11/28/2015   Dizziness 07/16/2015    Incomplete RBBB 03/31/2015   Sleep apnea, primary central 03/03/2015   Polyneuropathy ([place]) 01/24/2013   Right inguinal hernia 07/05/2012    ONSET DATE: Last several months   REFERRING DIAG: T17.998A (ICD-10-CM) - Aspiration of liquid, initial encounter   THERAPY DIAG:  Dysphagia, pharyngeal phase  Dysphagia, unspecified type  Rationale for Evaluation and Treatment Rehabilitation  SUBJECTIVE:   SUBJECTIVE STATEMENT: Pt states he coughs immediately after swallowing, with food and liquids, mostly foods. Pt accompanied by: self  PERTINENT HISTORY: Remote H. pylori associated duodenal ulcer (1992), H. pylori treatment previously, history of remote colon polyp, history of diverticulosis and colonic diverticulitis, internal hemorrhoids, sleep apnea on BiPAP, history of prostate cancer, HTN, pacemaker placement, hyperlipidemia, diabetes, rheumatoid arthritis on hydroxychloroquine and methotrexate. Pt states that his autoimmune disease is well-controlled with these meds.    PAIN:  Are you having pain? No  FALLS: Has patient fallen in last 6 months?  No  LIVING ENVIRONMENT: Lives with: lives with their spouse Lives in: House/apartment  PLOF:  Level of assistance: Independent with ADLs Employment: Retired   PATIENT GOALS "I want to get to the bottom of this."  OBJECTIVE:   DIAGNOSTIC FINDINGS:  DG Esophagus/Upper GI- 12/07/21:  IMPRESSION: 1.  No structural abnormality or significant esophageal dysmotility. 2. Moderate aspiration noted (non silent). Consider further evaluation with speech therapy. 3.  Moderate gastroesophageal reflux  Endoscopy of upper GI - 12/27/21 Impression:           - Esophageal plaques were found, consistent with                            candidiasis.   - No endoscopic esophageal abnormality to explain                            patient's dysphagia. Distal esophageal biopsies to                            examine for acid reflux related  inflammation.   - Mild gastritis. Biopsied to exclude H. Pylori                            infection.   - Normal examined duodenum. Recommendation:              - Patient has a contact number available for                            emergencies. The signs and symptoms of potential  delayed complications were discussed with the                            patient. Return to normal activities tomorrow.                            Written discharge instructions were provided to the                            patient.   - Resume previous diet.   - Continue present medications.     - Fluconazole 400 mg x 1 day, 200 mg 13 days for                            Candida esophagitis.   - Await pathology results. If esophageal biopsies                            show acid reflux related inflammation then consider                            low dose PPI.   - Office follow-up in 3 months. Jerene Bears, MD 12/27/2021 3:09:48 PM  RECOMMENDATIONS FROM OBJECTIVE SWALLOW STUDY (MBSS/FEES):  N/A at this time  COGNITION: Overall cognitive status: Within functional limits for tasks assessed  ORAL MOTOR EXAMINATION Overall status: Impaired: Lingual: Left (Strength) - slight/minimal Comments: Pt's motor speech exam revealed dysarthria due to suspected mostly lingual hypoarticulation; Speech intelligibility was 95%+. HArold told SLP people do NOT ask him to repeat his utterances.  CLINICAL SWALLOW ASSESSMENT:   Current diet: regular and thin liquids Dentition: adequate natural dentition Patient directly observed with POs: Yes: dysphagia 3 (soft), dysphagia 1 (puree), and thin liquids  Feeding: able to feed self Liquids provided by: cup Oral phase signs and symptoms:  none noted today Pharyngeal phase signs and symptoms: multiple swallows, wet vocal quality, immediate throat clear, and complaints of residue.  SLP noted more frequent throat clearing on the last 1/3 applesauce  container, and pt states that his coughing and sensed pharyngeal residues occurs more in the LATTER part of his meals.     PATIENT REPORTED OUTCOME MEASURES (PROM): EAT-10: Pt scored himself 21/40 (lower scores indicate better QOL)  "Severe problem" (4) was response for "food sticking in throat", and "I cough when I eat"; "3" was response for "swallowing is stressful", and for "swallowing pills takes extra effort".   TODAY'S TREATMENT:  Discussed results of evaluation, nature/explanation of MBS. Modeled effortful swallow for pt, and provided pt with HEP. SLP also discussed dysarthria with pt and suggested pt use overarticulation to remedy this.    PATIENT EDUCATION: Education details: see above in Today's Treatment Person educated: Patient Education method: Explanation, Demonstration, Verbal cues, and Handouts Education comprehension: verbalized understanding and returned demonstration   ASSESSMENT:  CLINICAL IMPRESSION: Patient is a 87 y.o. male who was seen today for bedside/clinical swallow evaluation in light of "moderate aspiration" during upper GI study in August 2023. A Modified Barium Swallow exam is recommended to assess current swallwo function. SLP wonders if pt's autoimmune syndrome has affected his swallow and his speech; curiously, pt's motor speech assessment was largely WNL (a slight weakness to lt of lingual musculature). Pt  had discernable dysarthria in his speech which minimally limited his intelligibility.   OBJECTIVE IMPAIRMENTS include dysarthria and dysphagia. These impairments are limiting patient from effectively communicating at home and in community and safety when swallowing. Factors affecting potential to achieve goals and functional outcome are co-morbidities. Patient will benefit from skilled SLP services to address above impairments and improve overall function.  REHAB POTENTIAL: Good   GOALS: Goals reviewed with patient? Yes, (should pt require further  ST)  SHORT TERM GOALS: Target date: 01/26/2022    Pt will complete objective swallow eval (MBSS, FEES) Baseline: Goal status: INITIAL  2.  Pt will follow aspiration precautions as directed on objective swallow eval in 3 sessions with rare min A Baseline:  Goal status: INITIAL  3.  Pt will complete dysphagia HEP generated following objective swallow eval with modified independence in 3 visits Baseline:  Goal status: INITIAL   LONG TERM GOALS: Target date: 02/25/2022    Pt will follow aspiration precautions as directed on objective swallow eval in 3 sessions with modified independence Baseline:  Goal status: INITIAL  2.  Pt will complete dysphagia HEP generated following objective swallow eval with independence in 3 visits Baseline:  Goal status: INITIAL  3.  Pt will indicate improved EAT-10 score in the last 1-2 sessions of ST, compared to initial EAT-10 score Baseline:  Goal status: INITIAL  4.  Pt will use overarticulation in conversation to improve speech intelligibility to 100% in 3 sessions Baseline:  Goal status: INITIAL   PLAN: SLP FREQUENCY: 2x/week PRN, after MBS  SLP DURATION: 8 weeks, PRN after MBS  PLANNED INTERVENTIONS: Aspiration precaution training, Pharyngeal strengthening exercises, Diet toleration management , Environmental controls, Trials of upgraded texture/liquids, Cueing hierachy, Oral motor exercises, SLP instruction and feedback, Compensatory strategies, and Patient/family education    [facility], [place] 12/29/2021, 12:37 PM

## 2021-12-29 NOTE — Patient Instructions (Signed)
   I am recommending a Modified Barium Swallow exam. If you are not notified by [facility] in the next 3-4 business days to schedule, please contact Dr. Vena Rua office and alert them of my recommendation for this evaluation.  I would recommend an EFFORTFUL SWALLOW exercise - 10-15 times, twice a day. Swallow like you are swallowing a golf ball!  I wonder if the auto-immune condition is causing any of this. You may want to contact your rheumatologist and inquire.

## 2021-12-30 ENCOUNTER — Other Ambulatory Visit (HOSPITAL_COMMUNITY): Payer: Self-pay | Admitting: *Deleted

## 2021-12-30 DIAGNOSIS — R1310 Dysphagia, unspecified: Secondary | ICD-10-CM

## 2021-12-30 DIAGNOSIS — R059 Cough, unspecified: Secondary | ICD-10-CM

## 2022-01-03 ENCOUNTER — Encounter: Payer: Self-pay | Admitting: Internal Medicine

## 2022-01-06 NOTE — Progress Notes (Signed)
Remote pacemaker transmission.   

## 2022-01-15 ENCOUNTER — Other Ambulatory Visit: Payer: Self-pay | Admitting: Physician Assistant

## 2022-01-15 DIAGNOSIS — M0609 Rheumatoid arthritis without rheumatoid factor, multiple sites: Secondary | ICD-10-CM

## 2022-01-17 NOTE — Telephone Encounter (Signed)
Next Visit: 02/01/2022  Last Visit: 08/18/2021  Labs: 11/10/2021 CMP is normal except glucose is elevated.  CBC is normal.  Eye exam: 03/09/2021 WNL    Current Dose per office note 08/18/2021: PLQ 200 mg BID. PLQ   PB:[id number] arthritis of multiple sites without rheumatoid factor   Last Fill: 08/16/2021  Okay to refill Plaquenil?

## 2022-01-18 ENCOUNTER — Ambulatory Visit (HOSPITAL_COMMUNITY)
Admission: RE | Admit: 2022-01-18 | Discharge: 2022-01-18 | Disposition: A | Discharge status: Home / Self Care | Payer: Medicare Other | Source: Ambulatory Visit | Attending: Cardiology | Admitting: Cardiology

## 2022-01-18 DIAGNOSIS — R1310 Dysphagia, unspecified: Secondary | ICD-10-CM

## 2022-01-18 DIAGNOSIS — R059 Cough, unspecified: Secondary | ICD-10-CM

## 2022-01-18 DIAGNOSIS — R471 Dysarthria and anarthria: Secondary | ICD-10-CM

## 2022-01-18 DIAGNOSIS — R1313 Dysphagia, pharyngeal phase: Secondary | ICD-10-CM

## 2022-01-18 NOTE — Progress Notes (Signed)
Office Visit Note  Patient: Jeffery [name]             Date of Birth: 01-Aug-1934           MRN: [id number]             PCP: Minus Breeding, MD Referring: Shon Baton, MD Visit Date: 02/01/2022 Occupation: '@GUAROCC'$ @  Subjective:  Fatigue  History of Present Illness: Jeffery [name] is a 87 y.o. male history of rheumatoid arthritis, PMR and osteoarthritis.  He states he continues to experience generalized fatigue.  He is not as active as he used to be.  He denies any history of joint pain or joint swelling.  After the last visit he reduced the dose of methotrexate to 5 tablets p.o. weekly.  He has not noticed any increased swelling on reduced dose of methotrexate.  He is still taking hydroxychloroquine 200 mg p.o. twice daily.  He continues to have some lower back discomfort.  He also feels some instability when he walks.  He would like to go for physical therapy.  Activities of Daily Living:  Patient reports morning stiffness for several hours.   Patient Reports nocturnal pain.  Difficulty dressing/grooming: Denies Difficulty climbing stairs: Denies Difficulty getting out of chair: Denies Difficulty using hands for taps, buttons, cutlery, and/or writing: Denies  Review of Systems  Constitutional:  Positive for fatigue.  HENT:  Negative for mouth sores and mouth dryness.   Eyes:  Negative for dryness.  Respiratory:  Negative for difficulty breathing.   Cardiovascular:  Negative for chest pain and palpitations.  Gastrointestinal:  Negative for blood in stool, constipation and diarrhea.  Endocrine: Negative for increased urination.  Genitourinary:  Negative for involuntary urination.  Musculoskeletal:  Positive for gait problem, myalgias, muscle weakness, morning stiffness, muscle tenderness and myalgias. Negative for joint pain, joint pain and joint swelling.  Skin:  Negative for color change, rash, hair loss and sensitivity to sunlight.  Allergic/Immunologic: Negative for  susceptible to infections.  Neurological:  Negative for dizziness and headaches.  Hematological:  Negative for swollen glands.  Psychiatric/Behavioral:  Positive for sleep disturbance. Negative for depressed mood. The patient is not nervous/anxious.     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   Cystoid macular edema of right eye 08/26/2019   Pseudophakia of both eyes 08/26/2019   Obstructive sleep apnea of adult 08/26/2019   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) 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 08/24/2019   Educated about COVID-19 virus infection 10/08/2018   Aortic root dilatation ([place]) 10/08/2018   Mitral annular calcification 10/08/2018   Neuropathy 08/21/2017   Hydroxychloroquine causing adverse effect in therapeutic use 08/21/2017   Symptomatic bradycardia 02/23/2017   Status cardiac pacemaker 02/23/2017   CSA (central sleep apnea) 02/23/2017   Heart block AV complete ([place]) 01/18/2017   Vitamin D deficiency 02/08/2016   Trigger finger, left middle finger 02/08/2016   Long-term use of Plaquenil 02/08/2016   Bursitis of right shoulder 02/08/2016   DDD (degenerative disc disease), lumbar 02/08/2016   DDD (degenerative disc  disease), thoracic 02/08/2016   Rheumatoid arthritis of multiple sites without rheumatoid factor ([place]) 11/28/2015    Class: Chronic   Other secondary osteoarthritis of both hands 11/28/2015    Class: Chronic   PMR (polymyalgia rheumatica) (HCC) 11/28/2015    Class: Chronic   Osteoporosis 11/28/2015     Class: Chronic   Prostate cancer ([place]) 11/28/2015    Class: History of   Duodenal ulcer 11/28/2015    Class: Chronic   High cholesterol 11/28/2015   Dizziness 07/16/2015   Incomplete RBBB 03/31/2015   Sleep apnea, primary central 03/03/2015   Polyneuropathy ([place]) 01/24/2013   Right inguinal hernia 07/05/2012    Past Medical History:  Diagnosis Date   Arthritis    Autoimmune disorder ([place])    BCC (basal cell carcinoma)    BPH (benign prostatic hypertrophy) 2008   Cancer [facility]) 2008   Prostate   Cervical stenosis of spine    per patient    Complete heart block (HCC)    Complex sleep apnea syndrome    does not use CPAP   Diabetes mellitus without complication ([place])    Diverticulosis    ED (erectile dysfunction)    GERD (gastroesophageal reflux disease)    Hemorrhoids    Hernia, inguinal    Hyperlipidemia    IFG (impaired fasting glucose)    Osteopenia    Peptic ulcer disease with hemorrhage 1993   Shingles    Sleep apnea    Vitamin D deficiency     Family History  Problem Relation Age of Onset   Heart failure Mother        Died age 57.  Apparently had valve surgery.    Congenital heart disease Brother    Stroke Maternal Grandfather    Colon cancer Neg Hx    Stomach cancer Neg Hx    Esophageal cancer Neg Hx    Rectal cancer Neg Hx    Past Surgical History:  Procedure Laterality Date   BACK SURGERY N/A 1984   barium swallow study  2023   x2   CATARACT EXTRACTION, BILATERAL     HERNIA REPAIR     LAMINECTOMY  1984   L 5   PACEMAKER IMPLANT  12/05/2016   MDT Azure XT DR MRI conditional PPM implanted for CHB by Dr Vista Deck in [place]  12/05/2016   PROSTATE SURGERY  12/2006   Robotic   TONSILLECTOMY  87 years old   [place]  03/01/2012   Procedure: MINOR RELEASE TRIGGER FINGER/A-1 PULLEY;  Surgeon: Cammie Sickle., MD;  Location: [place];  Service: Orthopedics;  Laterality: Left;  long finger   TRIGGER  FINGER RELEASE Left 02/08/2018   Procedure: RELEASE TRIGGER FINGER/A-1 PULLEY LEFT RING FINGER;  Surgeon: Daryll Brod, MD;  Location: [place];  Service: Orthopedics;  Laterality: Left;   UPPER GI ENDOSCOPY  2023   Social History   Social History Narrative   Right handed, Retired .Married, 2 kids. Caffeine none.   3 Step    Immunization History  Administered Date(s) Administered   Influenza, High Dose Seasonal PF 03/03/2017, 01/26/2018   Moderna SARS-COV2 Booster Vaccination 10/15/2020   Moderna Sars-Covid-2 Vaccination 04/13/2019, 05/14/2019, 12/12/2019     Objective: Vital Signs: BP 103/60 (BP Location: Left Arm, Patient Position: Sitting, Cuff Size: Normal)   Pulse 85   Resp 15   Ht '6\' 1"'$  (1.854 m)   Wt 158 lb (71.7 kg)   BMI 20.85  kg/m    Physical Exam Vitals and nursing note reviewed.  Constitutional:      Appearance: He is well-developed.  HENT:     Head: Normocephalic and atraumatic.  Eyes:     Conjunctiva/sclera: Conjunctivae normal.     Pupils: Pupils are equal, round, and reactive to light.  Cardiovascular:     Rate and Rhythm: Normal rate and regular rhythm.     Heart sounds: Normal heart sounds.     Comments: Pacemaker Pulmonary:     Effort: Pulmonary effort is normal.     Breath sounds: Normal breath sounds.  Abdominal:     General: Bowel sounds are normal.     Palpations: Abdomen is soft.  Musculoskeletal:     Cervical back: Normal range of motion and neck supple.  Skin:    General: Skin is warm and dry.     Capillary Refill: Capillary refill takes less than 2 seconds.  Neurological:     Mental Status: He is alert and oriented to person, place, and time.  Psychiatric:        Behavior: Behavior normal.      Musculoskeletal Exam: He had limited lateral rotation of the cervical spine without discomfort.  Shoulder joints, elbow joints, wrist joints, MCPs PIPs and DIPs and good range of motion.  He had bilateral PIP and DIP thickening.   Hip joints and knee joints with good range of motion without any warmth swelling or effusion.  There was no tenderness over ankles or MTPs.  He had good upper and lower extremity muscle strength.  CDAI Exam: CDAI Score: -- Patient Global: --; Provider Global: -- Swollen: --; Tender: -- Joint Exam 02/01/2022   No joint exam has been documented for this visit   There is currently no information documented on the homunculus. Go to the Rheumatology activity and complete the homunculus joint exam.  Investigation: No additional findings.  Imaging: DG Carlena Hurl OP MEDICARE SPEECH PATH  Result Date: 01/18/2022 Table formatting from the original result was not included. Images from the original result were not included. Objective Swallowing Evaluation: Type of Study: MBS-Modified Barium Swallow Study  Patient Details Name: Kailin Leu MRN: [id number] Date of Birth: 1935/01/09 Today's Date: 01/18/2022 Time: SLP Start Time (ACUTE ONLY): 6295 -SLP Stop Time (ACUTE ONLY): 2841 SLP Time Calculation (min) (ACUTE ONLY): 39 min Past Medical History: Past Medical History: Diagnosis Date  Arthritis   Autoimmune disorder ([place])   BCC (basal cell carcinoma)   BPH (benign prostatic hypertrophy) 2008  Cancer [facility] 2008  Prostate  Cervical stenosis of spine   per patient   Complete heart block (HCC)   Complex sleep apnea syndrome   does not use CPAP  Diabetes mellitus without complication ([place])   Diverticulosis   ED (erectile dysfunction)   GERD (gastroesophageal reflux disease)   Hemorrhoids   Hernia, inguinal   Hyperlipidemia   IFG (impaired fasting glucose)   Osteopenia   Peptic ulcer disease with hemorrhage 1993  Shingles   Sleep apnea   Vitamin D deficiency  Past Surgical History: Past Surgical History: Procedure Laterality Date  BACK SURGERY N/A 1984  CATARACT EXTRACTION, BILATERAL    HERNIA REPAIR    LAMINECTOMY  1984  L 5  PACEMAKER IMPLANT  12/05/2016  MDT Azure XT DR MRI conditional PPM implanted for  CHB by Dr Vista Deck in [place]  12/05/2016  PROSTATE SURGERY  12/2006  Robotic  TONSILLECTOMY  87 years old  [place]  03/01/2012  Procedure: MINOR RELEASE TRIGGER FINGER/A-1 PULLEY;  Surgeon: Cammie Sickle., MD;  Location: [place];  Service: Orthopedics;  Laterality: Left;  long finger  TRIGGER FINGER RELEASE Left 02/08/2018  Procedure: RELEASE TRIGGER FINGER/A-1 PULLEY LEFT RING FINGER;  Surgeon: Daryll Brod, MD;  Location: [place];  Service: Orthopedics;  Laterality: Left; HPI: 87 yo male referred for MBS by Dr Hilarie Fredrickson due to pt's report of dysphagia c/b sensation of food sticking in pharynx causing him to expectorate food at end of the meal.  Pt has PMH + for PMR, prostate cancer, PUD, pulmonary nodules,, RA, polyneuropathy, DM, shingles, "mild case of COVID", cervical stenosis and weight loss progression x1 year.  He reports dysarthria late in the evening over the last 6-8 months and fatigue over the last several years.    Memory deficits listed in his chart  - pt denies memory deficits, CVA, progressive neurological diseases.  He does endorse a good appetite.  He sees Dr Brett Fairy for sleep apnea- but has not reported to her changes in his speech in the afternoons.  Subjective: pt awake in chair  Recommendations for follow up therapy are one component of a multi-disciplinary discharge planning process, led by the attending physician.  Recommendations may be updated based on patient status, additional functional criteria and insurance authorization. Assessment / Plan / Recommendation   01/18/2022   2:00 PM Clinical Impressions Clinical Impression Patient presents with pharyngo-cervical esophageal dysphagia with motor more than sensory deficits.  His posterior lingual propulsion, epiglottic deflection and airway closure were compromised allowing retention and laryngeal penetration of nectar/thin and mild aspiration of thin.  Various postures  including chin tuck, head turn *left nor right* were effective to prevent retention. Pt able to conduct dry swallows and use liquid wash to decrease retention. Barium tablet swallowed with thin precariously lodged at vallecular space - then transited to pyriform sinus - eventually clearing with 2nd bolus of pudding provided. Pt aspirated thin barium when taking barium tablet.   Pt is largely protective of his airway with sequential swallows and following solids with liquids.  He will be at elevated risk of pna, if he does become acutely ill and/or deconditioned.  Of note, pt reports his speech clarity worsens in the evenings causing SLP to question source.  Perceptually, pt's speech - articulation,  resonation worsened at end of MBS resulting in decreased intelligibility.  Given pt reports chronic issues with dysarthria, recommend follow up with MD to elucidate source.  Encouraged pt to record himself reading a passage when senses speech clear and when not clear for MD to hear perceptual change.   Recommend follow up with SLP for strengthening, preferably after source of dysphagia determined.  All education completed using teach back. SLP Visit Diagnosis Dysphagia, pharyngoesophageal phase (R13.14) Impact on safety and function Mild aspiration risk      No data to display         No data to display      01/18/2022   2:00 PM Diet Recommendations SLP Diet Recommendations Regular solids;Thin liquid Liquid Administration via Cup Medication Administration Whole meds with liquid Compensations Slow rate;Small sips/bites;Follow solids with liquid;Multiple dry swallows after each bite/sip Postural Changes Seated upright at 90 degrees;Remain semi-upright after after feeds/meals (Comment)     01/18/2022   2:00 PM Other Recommendations Recommended Consults --    No data to display        01/18/2022   2:00 PM Oral Phase Oral Phase  [facility]    01/18/2022   2:00 PM Pharyngeal Phase Pharyngeal Phase Impaired Pharyngeal- Nectar Teaspoon  Reduced tongue base retraction;Pharyngeal residue - valleculae;Pharyngeal residue - pyriform;Reduced airway/laryngeal closure Pharyngeal Material does not enter airway Pharyngeal- Nectar Cup Reduced epiglottic inversion;Reduced laryngeal elevation;Reduced airway/laryngeal closure;Reduced tongue base retraction;Penetration/Aspiration during swallow Pharyngeal Material enters airway, remains ABOVE vocal cords then ejected out Pharyngeal- Nectar Straw NT Pharyngeal- Thin Teaspoon Reduced epiglottic inversion;Reduced laryngeal elevation;Reduced airway/laryngeal closure;Reduced tongue base retraction;Pharyngeal residue - valleculae;Pharyngeal residue - pyriform Pharyngeal Material does not enter airway Pharyngeal- Thin Cup Reduced epiglottic inversion;Reduced laryngeal elevation;Reduced airway/laryngeal closure;Penetration/Aspiration during swallow;Trace aspiration;Pharyngeal residue - valleculae;Pharyngeal residue - pyriform Pharyngeal Material enters airway, passes BELOW cords without attempt by patient to eject out (silent aspiration);Material enters airway, CONTACTS cords and not ejected out Pharyngeal- Thin Straw Reduced epiglottic inversion;Reduced laryngeal elevation;Reduced airway/laryngeal closure;Penetration/Aspiration during swallow;Pharyngeal residue - pyriform;Pharyngeal residue - valleculae;Trace aspiration Pharyngeal Material enters airway, passes BELOW cords without attempt by patient to eject out (silent aspiration);Material enters airway, CONTACTS cords and not ejected out Pharyngeal- Puree Reduced epiglottic inversion;Reduced laryngeal elevation;Reduced airway/laryngeal closure;Reduced tongue base retraction;Pharyngeal residue - valleculae;Pharyngeal residue - pyriform;Lateral channel residue Pharyngeal Material does not enter airway Pharyngeal- Mechanical Soft Reduced epiglottic inversion;Reduced laryngeal elevation;Reduced airway/laryngeal closure;Reduced tongue base retraction;Pharyngeal residue -  valleculae;Pharyngeal residue - pyriform Pharyngeal Material does not enter airway Pharyngeal- Pill Reduced epiglottic inversion;Reduced tongue base retraction;Reduced airway/laryngeal closure;Moderate aspiration;Pharyngeal residue - valleculae;Pharyngeal residue - pyriform;Compensatory strategies attempted (with notebox) Pharyngeal Comment Pharyngeal retention across all consistencies noted - worse with increased viscocity vs thin.  Trace aspiration of thin observed during the swallow as barium spills into open airway.  Chin tuck nor head turn left/right effective to prevent retention.  Liquid wash and dry swallows helpful.  Pt sensed aspiration of mild amount but not trace.  Barium tablet precariously halted at vallecular space then moved to pyriform sinus with 1st pudding bolus - 2nd bolus pushed tablet through UES into esophagus. Mild amount of aspiration noted with thin when swallowing barium tablet - with cough response.  .    01/18/2022   2:00 PM Cervical Esophageal Phase  Cervical Esophageal Phase Decreased clearance into esophagus  Kathleen Lime, MS [facility] SLP Acute Rehab Services Office [phone] Pager [phone] Macario Golds 01/18/2022, 3:11 PM                   CLINICAL DATA:  Provided history: Dysphagia, unspecified type. Cough, unspecified type. EXAM: MODIFIED BARIUM SWALLOW TECHNIQUE: Different consistencies of barium were administered orally to the patient by the Speech Pathologist. Imaging of the pharynx was performed in the lateral projection. Rowe Robert, PA-C was present in the fluoroscopy room and operated the fluoroscopy equipment. FLUOROSCOPY: Fluoroscopy time: 1 minute, 54 seconds (5.0 mGy). COMPARISON:  Esophagram 12/07/21 FINDINGS: Different consistencies of barium were administered orally to the patient by the Speech Pathologist with fluoroscopic imaging of the pharynx from a lateral projection. Rowe Robert, PA-C was present in the fluoroscopy room and operated the fluoroscopy  equipment. The patient aspirated a small amount of liquid barium contrast while also swallowing a 13 mm barium tablet. Please refer to the Speech Pathologist's report for full details. IMPRESSION: Modified barium swallow, as described. There was small-volume aspiration (of liquid barium contrast) while the patient was also swallowing a 13 mm barium tablet. Please refer to the Speech Pathologists report for complete details and recommendations. Electronically Signed   By: Kellie Simmering D.O.   On: 01/18/2022 14:54   Recent Labs: Lab Results  Component Value Date   WBC 4.6 01/27/2022   HGB 14.1 01/27/2022   PLT 241 01/27/2022   NA 139 01/27/2022   K 4.6 01/27/2022   CL 100 01/27/2022   CO2 34 (H) 01/27/2022   GLUCOSE 114 (H) 01/27/2022   BUN 18 01/27/2022   CREATININE 0.77 01/27/2022   BILITOT 0.5 01/27/2022   ALKPHOS 85 03/19/2019   AST 9 (L) 01/27/2022   ALT 14 01/27/2022   PROT 6.8 01/27/2022   ALBUMIN 4.7 (H) 03/19/2019   CALCIUM 9.7 01/27/2022   GFRAA 102 08/10/2020    Speciality Comments: PLQ Eye Exam: 03/09/2021 WNL @ Dr. Zadie Rhine. Follow up in 1 year  Procedures:  No procedures performed Allergies: Sulfasalazine, Demerol [meperidine], Garlic, Other, and Sulfa antibiotics   Assessment / Plan:     Visit Diagnoses: Rheumatoid arthritis of multiple sites without rheumatoid factor (HCC) - RF negative, CCP negative, 1433 eta negative, ANA negative.  He had no synovitis on examination.  He has been on methotrexate 5 tablets p.o. weekly along with Plaquenil 200 mg p.o. twice daily.  We discussed reducing Plaquenil to 200 mg p.o. daily as he is clinically doing well.  If he develops increased stiffness or pain then he may increase the dose of hydroxychloroquine 200 mg twice daily.  He has been experiencing increased fatigue.  High risk medication use - MTX 5 tabs po once weekly, folic acid 1 mg po daily, PLQ 200 mg BID. PLQ Eye Exam: 03/09/2021.  Labs obtained on January 27, 2022 were  reviewed which were within normal limits.  He has been advised to get labs every 3 months.  Information on immunization was placed in the AVS.  He was advised to hold methotrexate if he develops an infection and resume after the infection resolves.  PMR (polymyalgia rheumatica) (HCC)-  Primary osteoarthritis of both hands-bilateral IP and DIP thickening was noted.  No synovitis was noted.  DDD (degenerative disc disease), cervical-he had  some limitation on lateral rotation.  DDD (degenerative disc disease), thoracic-he denies any discomfort today.  DDD (degenerative disc disease), lumbar-he continues to have lower back pain.  Will refer him to physical therapy.  Other fatigue-he continues to have some generalized fatigue.  Osteopenia of multiple sites - 03/21 T -1.9.  He is taking calcium and vitamin D and has been off bisphosphonates.  He will need repeat DEXA scan.  History of vitamin D deficiency-he has been taking vitamin D.  Gait instability-he complained of gait instability.  Will refer him to physical therapy.  History of prostate cancer  History of diabetes mellitus  Heart block AV complete ([place])  History of sleep apnea  Orders: Orders Placed This Encounter  Procedures   DG Bone Density   Ambulatory referral to Physical Therapy   Meds ordered this encounter  Medications   omega-3 acid ethyl esters (LOVAZA) 1 g capsule    Sig: TAKE 1 CAPSULE BY MOUTH THREE TIMES A WEEK    Dispense:  36 capsule    Refill:  0   hydroxychloroquine (PLAQUENIL) 200 MG tablet    Sig: Take 1 tablet (200 mg total) by mouth daily.    Dispense:  90 tablet    Refill:  0   .  Follow-Up Instructions: Return in about 5 months (around 07/03/2022) for Rheumatoid arthritis, Osteoarthritis.   Bo Merino, MD  Note - This record has been created using Editor, commissioning.  Chart creation errors have been sought, but may not always  have been located.  Such creation errors do not reflect on   the standard of medical care.

## 2022-01-19 ENCOUNTER — Telehealth: Payer: Self-pay | Admitting: Neurology

## 2022-01-19 NOTE — Telephone Encounter (Signed)
Pt called in as he has recently been having some concerns and issues with swallowing. Heis following with gastro MD, Dr Hilarie Fredrickson for this concern and have had test ordered to address this. He completed a modified barrium swallow yesterday and upon talking with the speech therapist he had mentioned that sometimes he has noticed his speech is not correct, may have a hard time getting words out. He states other people have not noticed this but he has. ST suggested he reach out and schedule an appt with Dr Brett Fairy. Advised that she has limited availability but was able to get him scheduled for a 10:30 am with her on 03/01/22. Pt accepted this time and asked to be on wait list.

## 2022-01-26 ENCOUNTER — Other Ambulatory Visit: Payer: Self-pay | Admitting: Physician Assistant

## 2022-01-26 DIAGNOSIS — Z79899 Other long term (current) drug therapy: Secondary | ICD-10-CM

## 2022-01-26 NOTE — Telephone Encounter (Signed)
Next Visit: 02/01/2022   Last Visit: 08/18/2021  [id number] arthritis of multiple sites without rheumatoid factor    Last Fill: 11/16/2020  Okay to refill syringes?

## 2022-01-27 ENCOUNTER — Other Ambulatory Visit: Payer: Self-pay | Admitting: *Deleted

## 2022-01-27 DIAGNOSIS — Z79899 Other long term (current) drug therapy: Secondary | ICD-10-CM

## 2022-01-28 LAB — COMPLETE METABOLIC PANEL WITH GFR
AG Ratio: 1.8 (calc) (ref 1.0–2.5)
ALT: 14 U/L (ref 9–46)
AST: 9 U/L — ABNORMAL LOW (ref 10–35)
Albumin: 4.4 g/dL (ref 3.6–5.1)
Alkaline phosphatase (APISO): 57 U/L (ref 35–144)
BUN: 18 mg/dL (ref 7–25)
CO2: 34 mmol/L — ABNORMAL HIGH (ref 20–32)
Calcium: 9.7 mg/dL (ref 8.6–10.3)
Chloride: 100 mmol/L (ref 98–110)
Creat: 0.77 mg/dL (ref 0.70–1.22)
Globulin: 2.4 g/dL (calc) (ref 1.9–3.7)
Glucose, Bld: 114 mg/dL — ABNORMAL HIGH (ref 65–99)
Potassium: 4.6 mmol/L (ref 3.5–5.3)
Sodium: 139 mmol/L (ref 135–146)
Total Bilirubin: 0.5 mg/dL (ref 0.2–1.2)
Total Protein: 6.8 g/dL (ref 6.1–8.1)
eGFR: 88 mL/min/{1.73_m2} (ref >=60)

## 2022-01-28 LAB — CBC WITH DIFFERENTIAL/PLATELET
Absolute Monocytes: 515 cells/uL (ref 200–950)
Basophils Absolute: 9 cells/uL (ref 0–200)
Basophils Relative: 0.2 %
Eosinophils Absolute: 60 cells/uL (ref 15–500)
Eosinophils Relative: 1.3 %
HCT: 43.1 % (ref 38.5–50.0)
Hemoglobin: 14.1 g/dL (ref 13.2–17.1)
Lymphs Abs: 1408 cells/uL (ref 850–3900)
MCH: 31.4 pg (ref 27.0–33.0)
MCHC: 32.7 g/dL (ref 32.0–36.0)
MCV: 96 fL (ref 80.0–100.0)
MPV: 9.6 fL (ref 7.5–12.5)
Monocytes Relative: 11.2 %
Neutro Abs: 2608 cells/uL (ref 1500–7800)
Neutrophils Relative %: 56.7 %
Platelets: 241 10*3/uL (ref 140–400)
RBC: 4.49 10*6/uL (ref 4.20–5.80)
RDW: 12.5 % (ref 11.0–15.0)
Total Lymphocyte: 30.6 %
WBC: 4.6 10*3/uL (ref 3.8–10.8)

## 2022-01-28 NOTE — Progress Notes (Signed)
CBC WNL.  Glucose is 114. Rest of CMP stable.

## 2022-02-01 ENCOUNTER — Encounter: Payer: Self-pay | Admitting: Rheumatology

## 2022-02-01 ENCOUNTER — Ambulatory Visit: Payer: Medicare Other | Attending: Rheumatology | Admitting: Rheumatology

## 2022-02-01 VITALS — BP 103/60 | HR 85 | Resp 15 | Ht 73.0 in | Wt 158.0 lb

## 2022-02-01 DIAGNOSIS — R5383 Other fatigue: Secondary | ICD-10-CM | POA: Insufficient documentation

## 2022-02-01 DIAGNOSIS — Z8546 Personal history of malignant neoplasm of prostate: Secondary | ICD-10-CM | POA: Diagnosis present

## 2022-02-01 DIAGNOSIS — M19041 Primary osteoarthritis, right hand: Secondary | ICD-10-CM | POA: Insufficient documentation

## 2022-02-01 DIAGNOSIS — M0609 Rheumatoid arthritis without rheumatoid factor, multiple sites: Secondary | ICD-10-CM | POA: Diagnosis not present

## 2022-02-01 DIAGNOSIS — M19042 Primary osteoarthritis, left hand: Secondary | ICD-10-CM | POA: Insufficient documentation

## 2022-02-01 DIAGNOSIS — M353 Polymyalgia rheumatica: Secondary | ICD-10-CM | POA: Diagnosis not present

## 2022-02-01 DIAGNOSIS — M5030 Other cervical disc degeneration, unspecified cervical region: Secondary | ICD-10-CM | POA: Diagnosis present

## 2022-02-01 DIAGNOSIS — Z79899 Other long term (current) drug therapy: Secondary | ICD-10-CM | POA: Diagnosis not present

## 2022-02-01 DIAGNOSIS — R2681 Unsteadiness on feet: Secondary | ICD-10-CM | POA: Insufficient documentation

## 2022-02-01 DIAGNOSIS — I442 Atrioventricular block, complete: Secondary | ICD-10-CM | POA: Insufficient documentation

## 2022-02-01 DIAGNOSIS — Z8669 Personal history of other diseases of the nervous system and sense organs: Secondary | ICD-10-CM | POA: Insufficient documentation

## 2022-02-01 DIAGNOSIS — Z8639 Personal history of other endocrine, nutritional and metabolic disease: Secondary | ICD-10-CM | POA: Diagnosis present

## 2022-02-01 DIAGNOSIS — M5134 Other intervertebral disc degeneration, thoracic region: Secondary | ICD-10-CM | POA: Diagnosis present

## 2022-02-01 DIAGNOSIS — M5136 Other intervertebral disc degeneration, lumbar region: Secondary | ICD-10-CM | POA: Insufficient documentation

## 2022-02-01 DIAGNOSIS — M8589 Other specified disorders of bone density and structure, multiple sites: Secondary | ICD-10-CM | POA: Insufficient documentation

## 2022-02-01 MED ORDER — HYDROXYCHLOROQUINE SULFATE 200 MG PO TABS: 200.0000 mg | ORAL_TABLET | Freq: Every day | ORAL | 0 refills | Status: DC

## 2022-02-01 MED ORDER — OMEGA-3-ACID ETHYL ESTERS 1 G PO CAPS: ORAL_CAPSULE | ORAL | 0 refills | Status: DC

## 2022-02-01 NOTE — Patient Instructions (Signed)
Standing Labs We placed an order today for your standing lab work.   Please have your standing labs drawn in January and every 3 months  Please have your labs drawn 2 weeks prior to your appointment so that the provider can discuss your lab results at your appointment.  Please note that you may see your imaging and lab results in MyChart before we have reviewed them. We will contact you once all results are reviewed. Please allow our office up to 72 hours to thoroughly review all of the results before contacting the office for clarification of your results.  Lab hours are: Monday through Thursday from 1:30 pm-4:30 pm and Friday from 1:30 pm- 4:00 pm  You may experience shorter wait times on Monday, Thursday or Friday afternoons,.   Effective February 07, 2022, new lab hours will be: Monday through Thursday from 8:00 am -12:30 pm and 1:00 pm-5:00 pm and Friday from 8:00 am-12:00 pm.  Please be advised, all patients with office appointments requiring lab work will take precedent over walk-in lab work.   Labs are drawn by [facility]. Please bring your co-pay at the time of your lab draw.  You may receive a bill from [facility] for your lab work.  Please note if you are on Hydroxychloroquine and and an order has been placed for a Hydroxychloroquine level, you will need to have it drawn 4 hours or more after your last dose.  If you wish to have your labs drawn at another location, please call the office 24 hours in advance so we can fax the orders.  The office is located at [address], [address], [place], [place] [address] No appointment is necessary.    If you have any questions regarding directions or hours of operation,  please call [phone].   As a reminder, please drink plenty of water prior to coming for your lab work. Thanks!   Vaccines You are taking a medication(s) that can suppress your immune system.  The following immunizations are recommended: Flu annually Covid-19  Td/Tdap  (tetanus, diphtheria, pertussis) every 10 years Pneumonia (Prevnar 15 then Pneumovax 23 at least 1 year apart.  Alternatively, can take Prevnar 20 without needing additional dose) Shingrix: 2 doses from 4 weeks to 6 months apart  Please check with your PCP to make sure you are up to date.   If you have signs or symptoms of an infection or start antibiotics: First, call your PCP for workup of your infection. Hold your medication through the infection, until you complete your antibiotics, and until symptoms resolve if you take the following: Injectable medication (Actemra, Benlysta, Cimzia, Cosentyx, Enbrel, Humira, Kevzara, Orencia, Remicade, Simponi, Stelara, Taltz, Tremfya) Methotrexate Leflunomide (Arava) Mycophenolate (Cellcept) Xeljanz, Olumiant, or Rinvoq  

## 2022-02-02 ENCOUNTER — Ambulatory Visit (INDEPENDENT_AMBULATORY_CARE_PROVIDER_SITE_OTHER): Payer: Medicare Other | Admitting: Neurology

## 2022-02-02 ENCOUNTER — Telehealth: Payer: Self-pay | Admitting: Neurology

## 2022-02-02 ENCOUNTER — Encounter: Payer: Self-pay | Admitting: Neurology

## 2022-02-02 VITALS — BP 111/66 | HR 80 | Ht 74.0 in | Wt 157.0 lb

## 2022-02-02 DIAGNOSIS — R490 Dysphonia: Secondary | ICD-10-CM | POA: Diagnosis not present

## 2022-02-02 DIAGNOSIS — G4731 Primary central sleep apnea: Secondary | ICD-10-CM | POA: Diagnosis not present

## 2022-02-02 DIAGNOSIS — G629 Polyneuropathy, unspecified: Secondary | ICD-10-CM | POA: Diagnosis not present

## 2022-02-02 DIAGNOSIS — R1312 Dysphagia, oropharyngeal phase: Secondary | ICD-10-CM

## 2022-02-02 DIAGNOSIS — Z9989 Dependence on other enabling machines and devices: Secondary | ICD-10-CM

## 2022-02-02 DIAGNOSIS — M0609 Rheumatoid arthritis without rheumatoid factor, multiple sites: Secondary | ICD-10-CM

## 2022-02-02 DIAGNOSIS — K219 Gastro-esophageal reflux disease without esophagitis: Secondary | ICD-10-CM

## 2022-02-02 DIAGNOSIS — J040 Acute laryngitis: Secondary | ICD-10-CM

## 2022-02-02 NOTE — Telephone Encounter (Signed)
medicare/BC[employer] sup NPR sent to [facility] [phone] 

## 2022-02-02 NOTE — Progress Notes (Signed)
[name]  Provider:  Larey Seat, MD.   Referring Provider: Minus Breeding, MD Primary Care Physician:  Minus Breeding, MD  Chief Complaint  Patient presents with   Neurologic Problem    Pt with wife and daughter, rm 25. He is coming in because he has been having some problems with speech. He mentioned that ST thought would be beneficial to make sure there is nothing neurological going on causing the problems. He mentioned that as the day goes on his speech sometimes can sound slurred and voice becomes hoarse   RV on 02-02-2022:  Patient is a long time established  sleep apnea patient , age 87 , and has been concerned about his inability to use BiPAP compliantly,  to the dismay of his retinal specialist , dr Zadie Rhine. He noted no memory trouble, but has been more tired and fatigued, he has noted some slurring of speech- especially later in the day, dysphonia ( hoarseness) and dysphagia ( feeling food getting stuck in the back of his throat) , no diplopia. He is unaware of reflux. He does cough after eating, sometimes sneezing, nose is not running. Clearing his throat after solid and liquids. Particular food - rice,  popcorn, aspiration  He has become non-compliant with PAP, he had barium swallow test and endoscopy with Dr Hilarie Fredrickson, MD [facility]. He taped 2 voice documents one in AM and one in PM, these showed well modulated speech and interruption to take breath, not slurring, he was actually clearer in PM.  Not a fatigue relation.     Endoscopy 12-29-2021 with Dr Hilarie Fredrickson showed candidiasis.  First barium swallow test 12-07-2021: abnormal GI reflux and second test 01-18-2022: 12/07/2021 the test was done under Billie Lade MD barium phagiogram. " Barium swallow" he ingested a 13 mm barium tablet that passed into the stomach without significant delay, moderate gastroesophageal reflux was noted after the swallowing and moderate aspiration was noted which was not silent.   Then in  the second study from October tends there were different consistencies of barium administered under fluoroscopy.  The patient aspirated small amount of liquid barium contrast but he was able to swallow the 13 mm barium tablet.  So the barium tablet passed over the vallecular space, it cleared completely with a second bolus of pudding.  The study concluded that he is in his elevated risk of aspiration if it if he becomes acutely ill or deconditioned.  And the sleep speech therapist also said that the patient's speech and articulation and resignation worsened at the end of the test there was an impairment and this would account for the lack fluctuation of his speech difficulties.    Chin tuck did help and was effective to prevent retention.  The epiglottic deflection was compromised =the airway closure was therefore compromised. The question is why ? Motor system is not the smooth muscle striate      RV and interval history: 04-16-2020: Mr Jenetta Downer 'Laurence Slate reports wanting to get a baseline. He just had seen dr Vertell Limber who enforced having PT instead of surgery.  MOCA today 26/30 normal range and loss of 2 points on immediate recall.     02/02/2022    1:36 PM 04/16/2020    2:23 PM  Montreal Cognitive Assessment   Visuospatial/ Executive (0/5) 5 5  Naming (0/3) 3 2  Attention: Read list of digits (0/2) 2 1  Attention: Read list of letters (0/1) 1 1  Attention: Serial 7 subtraction starting at 100 (0/3)  3 3  Language: Repeat phrase (0/2) 2 2  Language : Fluency (0/1) 1 1  Abstraction (0/2) 2 2  Delayed Recall (0/5) 5 3  Orientation (0/6) 6 6  Total 30 26   He had been placed on BiPAP for complex apnea and achieved an AHI of 3.1/h but has not used the machine ( due to recall )in a while he never used an ozone cleaner and feel he can use it. The geriatric depression score was  endorsed at 2 points. night blindness. Dysesthesias resolved under steroids, bradycardia on pacemaker. No fainting spells, or near to  such.  BP was elevated today.        RV on 08-26-2019; I have the pleasure of meeting with [name] today on 26 Aug 2019 patient is an 87 year old Caucasian gentleman who has been followed for CPAP therapy as well as neuropathy.  Previous sleep studies had found primary central sleep apnea which responded to CPAP therapy but was in the last several months they have been rising levels of residual AHI currently to 8.45 using CPAP as an AutoSet.  Therefore the patient was invited to return and see if he can actually titrate him to BiPAP.  We used a nonmagnetic Clapacs near the right ear a full face medium and first began with a CPAP titration from 5-14 cmH2O it did not reduce the AHI significantly changed to BiPAP was made beginning at 14/10 cmH2O and immediately reducing the residual apnea to 1.5/h the best result was seen on 15/11 centimeters water pressure it seems that for this complex sleep apnea and for the periodic limb movements BiPAP therapy controlled both.  Also there was a reduction of the hypoxemia time to 29 to 24 minutes only which is much less than in the sleep study.  I would very much be in favor to change therapy to BiPAP and if possible to use a range between 14/10 up to 19/15 cmH2O to allow also adjustment depending on supine or nonsupine sleep position.he is between medium and large size for the Vitera mask.   I expected him to be awaiting the BiPAP to be issued.  He would consider rather bying ASV outright. He needs to return for a night. ASV.  I will explore.    [name], 61, presents after he had an extensive visit with his ophthalmologist and retinal specialist , Dr. Zadie Rhine, Whom he sees for Plaquenil follow up. 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 Zadie Rhine asked the patient if he snores which she confirmed.  He also stated that  treatment of obstructive sleep apnea may within a week resolve or alleviate the Tele-angiectasis.  Aside from the concern of snoring, there is also excessive daytime sleepiness loaders now the patient endorsed the Epworth Sleepiness Scale at 16 out of 24 possible points, but fatigue severity was only endorsed at 39 points.The patient's body mass index has not increased, but decreased. He remains with a deviated septum.  [name] had been seen in 2011-sleep study obtained on 24 Aug 2009 confirmed an AHI also known as apnea hypopnea index of 27.7/h of sleep, equal to almost 1 respiratory event every 2 minutes.  Supine there was much accentuation to a supine AHI of 75.6/h, there was only slight accentuation in rem sleep.  Interesting is that the patient had 8 obstructive apneas alongside 124 central and 16 mixed  apneas.  There were very few hypopneas.  Central apnea however is not always responsive to CPAP.  He returned for a CPAP and BiPAP titration on 14 September 2009 and did not tolerate or respond well to pressures between 4 and 8 cmH2O CPAP pressure.  Subsequently BiPAP was initiated he tried several masks settling on a full facemask at the most is the most acceptable.  There was no optimal pressure on BiPAP found either. He just couldn't sleep. Dr Lonzo Cloud interpreted and recommended 7 cm CPAP as the best solution.   [name] is seen on 23 February 2017 and a follow-up visit after recent cervical spine MRI was obtained.  In the meantime he has received a pacemaker, and is followed by Dr. Rayann Heman through [facility].  Symptoms occurred in [place], [place] where he was diagnosed and his care was transferred here, he received a temporary pacemaker in [place] .  Treatment for severe bradycardia. diaphoretic and with palpitation, bowel incontinence.  In the interim he had on 11-28-2016 an MRI - he called Dr. Sherwood Gambler and has not made a follow up with him. Post-pacemaker implantation he had left arm and wrist  pain, was treated with prednisone and has not had symptoms in neck and hands since! Marland Kitchen   08-21-2017, well adjusted to his pacemaker, no complications. No syncope, remains numb behind the ear. We will follow his complex apnea, neuropathy as needed.  No concerns of sleep , no pain.    11-01-2016. Isael H Irish Piech is a 87 y.o. male seen here as a revisit  from Dr. Percival Spanish for a newly noted area of dysesthesia behind the left ear.  There is no numbness- the skin sensation is intermittently disturbed, tingly, like a fine touch. I evaluated the area and it does not quite fit a dermatomal distribution-Area is retroauricular and would be innervated by C2. There is no spasm.   This patient of Dr. Virgina Jock Dr. Estanislado Pandy has followed up since May 2011. Initially he was referred for a polysomnogram based on chronic fatigue complaints and was diagnosed as complex apnea.  The apnea short-billed complements central and obstructive, but in a distribution that was more typical for a with apnea.  They were neuropathy related labs sent out and 2011 as well as also to address chronic fatigue and all returned to normal range. The patient clearly has an accentuated AHI by position and during REM sleep. He has been healthy and maintains an active lifestyle is traveling is a nonsmoker nondrinker and has nausea history. The patient responded well to Wellbutrin on only 100 mg daily taken to eliminate or reduce central apneas. During his last visit in 2013 his fatigue score was 20 and his Epworth score 14 points and he had reported one nocturia on average per night. He estimated his daily sleep time is 7 hours or more. He sometimes naps in the afternoons. This is still the case today:  his Epworth is 11 points, and fatigue score is 24. I would like to add that the patient is on chronic Plaquenil therapy. This was initiated after he developed severe muscle stiffness and relief this is the symptoms within 2-3 weeks. The rigidity  was perceived as painful and restricted.The neuropathic complement is that of a nonpainful peripheral neuropathy, length-dependent. Plaquenil  may contribute somewhat to the development of the neuropathy. Wellbutrin has had some positive effect on the level of alertness and daytime and may also be due discomfort.  03-03-15 [name] reports that  he had taken a drug holiday and for about 6 months did not use Plaquenil. As the responds he noted again stiffness in his hands and inflammation. Dr. Estanislado Pandy , his rheumatologist, had placed him on his prednisone taper but then Plaquenil was reinitiated about 3 months ago. I would like to add that the stiffness has never affected his lower extremities or feet and has always been manifested in his hands. He also has 3 trigger fingers. Fatigue is stable. His Epworth score is endorsed at 9 points and his fatigue severity at 34 points. The geriatric depression score was  endorsed at 2 points.  We are seeing today after [name] suffered a sudden fall with a laceration of the left for head and fracture of the left thumb. He was walking briskly but on an uneven surface and initially believed that he tripped and fell. But he had as sudden premonition to slow down,  before he fell which tells me that he most likely fainted. He has no history of seizures, he was able to break his fall but had no focal deficits [name] is a fall and also did not feel that he had problems was generating words, but his thought process was scrambled or that he had any focal motor deficits. This rules out a TIA.  Once he was on the ground he was immediately conscious and aware of his surroundings- he was not combative and he will actually waited patiently for EMS to arrive. The ED reports were reviewed here with him. A cardiac workup was initiated and his transthoracic echo cardiogram revealed no abnormalities.  He also underwent carotid Doppler studies which showed minor in my opinion trivial  calcifications at the bifurcation. These would be in the range between 1 and 40% the nonsurgical indication range. I would like for the patient to take a baby aspirin is possibly enteric coated once a day should he develop bruising I will like him to take it every other day. I also discussed with him that we have never established at baseline for his neuropathy. I would like for him as early as available in the new year to undergo an EMG and nerve conduction study to evaluate the degree of peripheral neuropathy and axonal versus demyelinating quality of the neuropathy. The patient is interested. A recent comprehensive metabolic panel was Dr. Franchot Gallo was interpreted as normal , as well as a CBC diff  which she has every 3 months.   Review of Systems: Out of a complete 14 system review, the patient complains of only the following symptoms, and all other reviewed systems are negative. Intermittent fatigue. Fatigue is stable. His Epworth score is endorsed at 7 points and his fatigue severity at 27 points.  The geriatric depression score was  endorsed at 2 points. night blindness. Dysesthesias resolved under steroids, bradycardia on pacemaker.    Eye clinic at [facility] - Plaquenil (Hydroxychloroquine) Screening  I, the attending physician, performed the HPI with the patient and updated the documentation appropriately. The patient is here for a return visit. The patient is here today for Plaquenil (Hydroxychloroquine) screening. Patient has Rheumatoid Arthritis. The patient has been taking Plaquenil (Hydroxychloroquine) for more than 5 years. The patient has been taking Plaquenil (Hydroxychloroquine) for approximately 7 years. The patient reports no changes in their vision. Known co-existing eye disease: macular degeneration.  The patient does not request a new glasses prescription. Review of eye symptoms reveals: Negative for flashes, eye pain and floaters.  Last edited by Elta Guadeloupe  Hazle Nordmann, MD on  08/17/2017 3:35 PM. (History)     Social History   Socioeconomic History   Marital status: Married    Spouse name: Not on file   Number of children: 2   Years of education: Not on file   Highest education level: Not on file  Occupational History   Not on file  Tobacco Use   Smoking status: Never    Passive exposure: Never   Smokeless tobacco: Never  Vaping Use   Vaping Use: Never used  Substance and Sexual Activity   Alcohol use: Not Currently   Drug use: No   Sexual activity: Not on file  Other Topics Concern   Not on file  Social History Narrative   Right handed, Retired .Married, 2 kids. Caffeine none.   3 Step    Social Determinants of Health   Financial Resource Strain: Not on file  Food Insecurity: Not on file  Transportation Needs: Not on file  Physical Activity: Not on file  Stress: Not on file  Social Connections: Not on file  Intimate Partner Violence: Not on file    Family History  Problem Relation Age of Onset   Heart failure Mother        Died age 68.  Apparently had valve surgery.    Congenital heart disease Brother    Stroke Maternal Grandfather    Colon cancer Neg Hx    Stomach cancer Neg Hx    Esophageal cancer Neg Hx    Rectal cancer Neg Hx     Past Medical History:  Diagnosis Date   Arthritis    Autoimmune disorder ([place])    BCC (basal cell carcinoma)    BPH (benign prostatic hypertrophy) 2008   Cancer [facility]) 2008   Prostate   Cervical stenosis of spine    per patient    Complete heart block (HCC)    Complex sleep apnea syndrome    does not use CPAP   Diabetes mellitus without complication (HCC)    Diverticulosis    ED (erectile dysfunction)    GERD (gastroesophageal reflux disease)    Hemorrhoids    Hernia, inguinal    Hyperlipidemia    IFG (impaired fasting glucose)    Osteopenia    Peptic ulcer disease with hemorrhage 1993   Shingles    Sleep apnea    Vitamin D deficiency     Past Surgical History:  Procedure Laterality  Date   BACK SURGERY N/A 1984   barium swallow study  2023   x2   CATARACT EXTRACTION, BILATERAL     HERNIA REPAIR     LAMINECTOMY  1984   L 5   PACEMAKER IMPLANT  12/05/2016   MDT Azure XT DR MRI conditional PPM implanted for CHB by Dr Vista Deck in [place]  12/05/2016   PROSTATE SURGERY  12/2006   Robotic   TONSILLECTOMY  87 years old   [place]  03/01/2012   Procedure: MINOR RELEASE TRIGGER FINGER/A-1 PULLEY;  Surgeon: Cammie Sickle., MD;  Location: [place];  Service: Orthopedics;  Laterality: Left;  long finger   TRIGGER FINGER RELEASE Left 02/08/2018   Procedure: RELEASE TRIGGER FINGER/A-1 PULLEY LEFT RING FINGER;  Surgeon: Daryll Brod, MD;  Location: [place];  Service: Orthopedics;  Laterality: Left;   UPPER GI ENDOSCOPY  2023    Current Outpatient Medications  Medication Sig Dispense Refill   acetaminophen (TYLENOL) 500 MG tablet Take  500 mg by mouth every 6 (six) hours as needed.     Cholecalciferol (VITAMIN D3) 1.25 MG (50000 UT) CAPS TAKE 1 CAPSULE BY MOUTH ONCE PER MONTH 12 capsule 0   cholecalciferol (VITAMIN D3) 25 MCG (1000 UNIT) tablet Take 1,000 Units by mouth daily.     diclofenac sodium (VOLTAREN) 1 % GEL Apply 2-4 grams to affected joint, up to 4 times daily. (Patient taking differently: Apply 2-4 grams to affected joint, up to 4 times daily as needed) 630 g 2   folic acid (FOLVITE) 1 MG tablet TAKE 1 TABLET BY MOUTH EVERY DAY 90 tablet 3   hydroxychloroquine (PLAQUENIL) 200 MG tablet Take 1 tablet (200 mg total) by mouth daily. 90 tablet 0   metFORMIN (GLUCOPHAGE) 500 MG tablet Take 500 mg daily by mouth.      methotrexate 2.5 MG tablet TAKE 5 TABLETS BY MOUTH ONCE WEEKLY FOR CHEMOTHERAPY, PROTECT FROM LIGHT 60 tablet 0   omega-3 acid ethyl esters (LOVAZA) 1 g capsule TAKE 1 CAPSULE BY MOUTH THREE TIMES A WEEK 36 capsule 0   ONE TOUCH ULTRA TEST test strip TEST BLOOD SUGAR BID AS DIRECTED  2    TUBERCULIN SYR 1CC/27GX1/2" (B-D TB SYRINGE 1CC/27GX1/2") 27G X 1/2" 1 ML MISC USE TO INJECT METHOTREXATE 12 each 3   vitamin E 100 UNIT capsule Take 100 Units by mouth daily.     zolpidem (AMBIEN) 5 MG tablet Take 5 mg by mouth as needed.     No current facility-administered medications for this visit.    Allergies as of 02/02/2022 - Review Complete 02/02/2022  Allergen Reaction Noted   Sulfasalazine Anaphylaxis 02/08/2016   Demerol [meperidine] Nausea Only 16/04/930   Garlic  35/57/3220   Other  09/25/2017   Sulfa antibiotics  02/23/2017    Vitals: BP 111/66   Pulse 80   Ht '6\' 2"'$  (1.88 m)   Wt 157 lb (71.2 kg)   BMI 20.16 kg/m  Last Weight:  Wt Readings from Last 1 Encounters:  02/02/22 157 lb (71.2 kg)   Last Height:   Ht Readings from Last 1 Encounters:  02/02/22 '6\' 2"'$  (1.88 m)   DOB: 1934/06/01 MRN: [id number]        Physical exam: General: The patient is awake, alert and appears not in acute distress. The patient is well groomed. Head: Normocephalic, atraumatic. Neck is supple. Mallampati 2 , neck circumference: 15 inches.   Cardiovascular:  Regular rate and rhythm, without  murmurs -without distended neck veins. Respiratory: Lungs are clear to auscultation. Skin:  Without evidence of edema, or rash Trunk: BMI is normal. He lost weight by design.   Neurologic exam: The patient is awake and alert, oriented to place and time.   Memory subjective described as intact. There is a normal attention span & concentration ability.  Speech is fluent without dysarthria, dysphonia or aphasia.  Mood and affect are appropriate.  Cranial nerves: Pupils are equal and briskly reactive to light.  Funduscopic exam - quoted from Dr. Radene Ou notes.  Extraocular movements  in vertical and horizontal planes intact and without nystagmus. Visual fields by finger perimetry are intact.Hearing to finger rub intact.Facial sensation intact to fine touch.  Facial motor strength is symmetric ,  his  tongue and uvula move midline. Motor exam:  Low muscle mass.   Deferred.left grip strength is limited after trigger finger surgery.  Sensory:   I did not test the planta pedis.  Coordination: Rapid alternating movements in the fingers/hands is  normal.  Finger-to-nose maneuver tested and normal without evidence of ataxia, dysmetria or tremor. Gait and station:  Stance is stable and normal. Deep tendon reflexes: in the extremities are attenuated , symmetric and intact.   Assessment:   45 minute discussion- MOCA was in normal range last time and not repeated. .  After physical and neurologic examination, review of laboratory studies, imaging, neurophysiology testing and pre-existing records, assessment is   1) EDS- Epworth 10 points - elevated.  Central dominant Sleep Apnea was found  in 2011 - now his ophthalmologist noted retinal changes suggesting a correlation to untreated sleep apnea.He has not responded any longer to CPAP, and was just titrated to BiPAP, but is non-compliant now.    2) confirmed GI reflux, risk of aspiration-dysphagia- delayed clearing of the throat.  Mouth dryness-  can tuck his chin and overcome the delayed epiglottis closure. Dysphonia, dysarthria.    3) neuropathy - question if related to autoimmune sytem disorder or plaquenil. Mouth dryness.  Recommended Oasis mouth wash- Biotin. No PLMs when apnea controlled.  autoimmune component treated with MTX.   Plan:  Treatment plan and additional workup :   I will order neuromuscular labs, and a MRI brain.   He is on plaquenil for 50 years, reduced dose to100 mg , MTX once a month 5 tab in one day.   I accept that Mr [name] is not going to return to PAP therapy. He has a pacemaker , no recent heart trouble. Marland Kitchen    MIND diet. Social and physical activity is important. Stimulation of mind and body.  RV in 3-4 months with me.  Larey Seat, MD. CC Dyke Brackett, MD and Shon Baton, MD

## 2022-02-02 NOTE — Patient Instructions (Signed)
Gastroesophageal Reflux Disease, Adult  Gastroesophageal reflux (GER) happens when acid from the stomach flows up into the tube that connects the mouth and the stomach (esophagus). Normally, food travels down the esophagus and stays in the stomach to be digested. With GER, food and stomach acid sometimes move back up into the esophagus. You may have a disease called gastroesophageal reflux disease (GERD) if the reflux: Happens often. Causes frequent or very bad symptoms. Causes problems such as damage to the esophagus. When this happens, the esophagus becomes sore and swollen. Over time, GERD can make small holes (ulcers) in the lining of the esophagus. What are the causes? This condition is caused by a problem with the muscle between the esophagus and the stomach. When this muscle is weak or not normal, it does not close properly to keep food and acid from coming back up from the stomach. The muscle can be weak because of: Tobacco use. Pregnancy. Having a certain type of hernia (hiatal hernia). Alcohol use. Certain foods and drinks, such as coffee, chocolate, onions, and peppermint. What increases the risk? Being overweight. Having a disease that affects your connective tissue. Taking NSAIDs, such a ibuprofen. What are the signs or symptoms? Heartburn. Difficult or painful swallowing. The feeling of having a lump in the throat. A bitter taste in the mouth. Bad breath. Having a lot of saliva. Having an upset or bloated stomach. Burping. Chest pain. Different conditions can cause chest pain. Make sure you see your doctor if you have chest pain. Shortness of breath or wheezing. A long-term cough or a cough at night. Wearing away of the surface of teeth (tooth enamel). Weight loss. How is this treated? Making changes to your diet. Taking medicine. Having surgery. Treatment will depend on how bad your symptoms are. Follow these instructions at home: Eating and drinking  Follow a  diet as told by your doctor. You may need to avoid foods and drinks such as: Coffee and tea, with or without caffeine. Drinks that contain alcohol. Energy drinks and sports drinks. Bubbly (carbonated) drinks or sodas. Chocolate and cocoa. Peppermint and mint flavorings. Garlic and onions. Horseradish. Spicy and acidic foods. These include peppers, chili powder, curry powder, vinegar, hot sauces, and BBQ sauce. Citrus fruit juices and citrus fruits, such as oranges, lemons, and limes. Tomato-based foods. These include red sauce, chili, salsa, and pizza with red sauce. Fried and fatty foods. These include donuts, french fries, potato chips, and high-fat dressings. High-fat meats. These include hot dogs, rib eye steak, sausage, ham, and bacon. High-fat dairy items, such as whole milk, butter, and cream cheese. Eat small meals often. Avoid eating large meals. Avoid drinking large amounts of liquid with your meals. Avoid eating meals during the 2-3 hours before bedtime. Avoid lying down right after you eat. Do not exercise right after you eat. Lifestyle  Do not smoke or use any products that contain nicotine or tobacco. If you need help quitting, ask your doctor. Try to lower your stress. If you need help doing this, ask your doctor. If you are overweight, lose an amount of weight that is healthy for you. Ask your doctor about a safe weight loss goal. General instructions Pay attention to any changes in your symptoms. Take over-the-counter and prescription medicines only as told by your doctor. Do not take aspirin, ibuprofen, or other NSAIDs unless your doctor says it is okay. Wear loose clothes. Do not wear anything tight around your waist. Raise (elevate) the head of your bed about   6 inches (15 cm). You may need to use a wedge to do this. Avoid bending over if this makes your symptoms worse. Keep all follow-up visits. Contact a doctor if: You have new symptoms. You lose weight and you  do not know why. You have trouble swallowing or it hurts to swallow. You have wheezing or a cough that keeps happening. You have a hoarse voice. Your symptoms do not get better with treatment. Get help right away if: You have sudden pain in your arms, neck, jaw, teeth, or back. You suddenly feel sweaty, dizzy, or light-headed. You have chest pain or shortness of breath. You vomit and the vomit is green, yellow, or black, or it looks like blood or coffee grounds. You faint. Your poop (stool) is red, bloody, or black. You cannot swallow, drink, or eat. These symptoms may represent a serious problem that is an emergency. Do not wait to see if the symptoms will go away. Get medical help right away. Call your local emergency services (911 in the [place] Do not drive yourself to the hospital. Summary If a person has gastroesophageal reflux disease (GERD), food and stomach acid move back up into the esophagus and cause symptoms or problems such as damage to the esophagus. Treatment will depend on how bad your symptoms are. Follow a diet as told by your doctor. Take all medicines only as told by your doctor. This information is not intended to replace advice given to you by your health care provider. Make sure you discuss any questions you have with your health care provider. Document Revised: 10/07/2019 Document Reviewed: 10/07/2019 Elsevier Patient Education  2023 [employer]  

## 2022-02-05 ENCOUNTER — Encounter: Payer: Self-pay | Admitting: Neurology

## 2022-02-07 ENCOUNTER — Encounter: Payer: Self-pay | Admitting: Neurology

## 2022-02-10 ENCOUNTER — Other Ambulatory Visit (HOSPITAL_BASED_OUTPATIENT_CLINIC_OR_DEPARTMENT_OTHER): Payer: Self-pay

## 2022-02-10 MED ORDER — INFLUENZA VAC A&B SA ADJ QUAD 0.5 ML IM PRSY
PREFILLED_SYRINGE | INTRAMUSCULAR | 0 refills | Status: DC
  Filled 2022-02-10: qty 0.5, 1d supply, fill #0

## 2022-02-15 ENCOUNTER — Encounter: Payer: Self-pay | Admitting: Neurology

## 2022-02-15 LAB — PROTEIN ELECTROPHORESIS, SERUM
A/G Ratio: 1.6 (ref 0.7–1.7)
Albumin ELP: 4.2 g/dL (ref 2.9–4.4)
Alpha 1: 0.2 g/dL (ref 0.0–0.4)
Alpha 2: 0.6 g/dL (ref 0.4–1.0)
Beta: 0.9 g/dL (ref 0.7–1.3)
Gamma Globulin: 1 g/dL (ref 0.4–1.8)
Globulin, Total: 2.7 g/dL (ref 2.2–3.9)
Total Protein: 6.9 g/dL (ref 6.0–8.5)

## 2022-02-15 LAB — MYASTHENIA GRAVIS PROFILE
AChR Binding Ab, Serum: <0.03 nmol/L (ref 0.00–0.24)
AChR-modulating Ab: 0 % (ref 0–45)
Acetylchol Block Ab: 11 % (ref 0–25)
Anti-striation Abs: NEGATIVE

## 2022-02-15 LAB — MUSK ANTIBODIES: MuSK Antibodies: <1 U/mL

## 2022-02-15 LAB — CK TOTAL AND CKMB (NOT AT ARMC)
CK-MB Index: 6.7 ng/mL (ref 0.0–10.4)
Total CK: 164 U/L (ref 30–208)

## 2022-03-01 ENCOUNTER — Other Ambulatory Visit: Payer: Self-pay | Admitting: Physician Assistant

## 2022-03-01 ENCOUNTER — Ambulatory Visit: Payer: Medicare Other | Admitting: Neurology

## 2022-03-01 DIAGNOSIS — M0609 Rheumatoid arthritis without rheumatoid factor, multiple sites: Secondary | ICD-10-CM

## 2022-03-01 DIAGNOSIS — Z79899 Other long term (current) drug therapy: Secondary | ICD-10-CM

## 2022-03-01 NOTE — Telephone Encounter (Signed)
Next Visit: 07/19/2022  Last Visit: 02/01/2022  Last Fill: 12/08/2021  DX: Rheumatoid arthritis of multiple sites without rheumatoid factor   Current Dose per office note 02/01/2022: MTX 5 tabs po once weekly   Labs: 01/27/2022 CBC WNL. Glucose is 114. Rest of CMP stable.    Okay to refill MTX?

## 2022-03-09 ENCOUNTER — Other Ambulatory Visit: Payer: Self-pay | Admitting: Rheumatology

## 2022-03-15 ENCOUNTER — Ambulatory Visit (INDEPENDENT_AMBULATORY_CARE_PROVIDER_SITE_OTHER): Payer: Medicare Other

## 2022-03-15 DIAGNOSIS — I442 Atrioventricular block, complete: Secondary | ICD-10-CM | POA: Diagnosis not present

## 2022-03-15 LAB — CUP PACEART REMOTE DEVICE CHECK
Battery Remaining Longevity: 75 mo
Battery Voltage: 2.98 V
Brady Statistic AP VP Percent: 6.01 %
Brady Statistic AP VS Percent: 0 %
Brady Statistic AS VP Percent: 91.29 %
Brady Statistic AS VS Percent: 2.7 %
Brady Statistic RA Percent Paced: 6.66 %
Brady Statistic RV Percent Paced: 97.3 %
Date Time Interrogation Session: 20231204223524
Implantable Lead Connection Status: 753985
Implantable Lead Connection Status: 753985
Implantable Lead Implant Date: 20180827
Implantable Lead Implant Date: 20180827
Implantable Lead Location: 753859
Implantable Lead Location: 753860
Implantable Lead Model: 4076
Implantable Lead Model: 4076
Implantable Pulse Generator Implant Date: 20180827
Lead Channel Impedance Value: 323 Ohm
Lead Channel Impedance Value: 380 Ohm
Lead Channel Impedance Value: 399 Ohm
Lead Channel Impedance Value: 437 Ohm
Lead Channel Pacing Threshold Amplitude: 0.625 V
Lead Channel Pacing Threshold Amplitude: 0.75 V
Lead Channel Pacing Threshold Pulse Width: 0.4 ms
Lead Channel Pacing Threshold Pulse Width: 0.4 ms
Lead Channel Sensing Intrinsic Amplitude: 16.875 mV
Lead Channel Sensing Intrinsic Amplitude: 16.875 mV
Lead Channel Sensing Intrinsic Amplitude: 4.375 mV
Lead Channel Sensing Intrinsic Amplitude: 4.375 mV
Lead Channel Setting Pacing Amplitude: 1.5 V
Lead Channel Setting Pacing Amplitude: 2.5 V
Lead Channel Setting Pacing Pulse Width: 0.4 ms
Lead Channel Setting Sensing Sensitivity: 4 mV
Zone Setting Status: 755011

## 2022-03-17 ENCOUNTER — Other Ambulatory Visit (HOSPITAL_BASED_OUTPATIENT_CLINIC_OR_DEPARTMENT_OTHER): Payer: Self-pay

## 2022-03-17 MED ORDER — COMIRNATY 30 MCG/0.3ML IM SUSY
PREFILLED_SYRINGE | INTRAMUSCULAR | 0 refills | Status: DC
  Filled 2022-03-17: qty 0.3, 1d supply, fill #0

## 2022-03-18 ENCOUNTER — Other Ambulatory Visit (HOSPITAL_BASED_OUTPATIENT_CLINIC_OR_DEPARTMENT_OTHER): Payer: Self-pay

## 2022-03-18 MED ORDER — METFORMIN HCL 500 MG PO TABS: 1000.0000 mg | ORAL_TABLET | Freq: Every day | ORAL | 3 refills | Status: DC

## 2022-03-18 MED ORDER — ONETOUCH DELICA PLUS LANCET33G MISC: 3 refills | Status: AC

## 2022-03-18 MED ORDER — GLUCOSE BLOOD VI STRP: ORAL_STRIP | Freq: Two times a day (BID) | 3 refills | Status: DC

## 2022-03-18 MED ORDER — OMEGA-3-ACID ETHYL ESTERS 1 G PO CAPS: 1.0000 | ORAL_CAPSULE | ORAL | 0 refills | Status: DC

## 2022-03-18 MED ORDER — METHOTREXATE SODIUM CHEMO INJECTION (PF) 50 MG/2ML: INTRAMUSCULAR | 2 refills | Status: DC

## 2022-03-23 ENCOUNTER — Other Ambulatory Visit (HOSPITAL_BASED_OUTPATIENT_CLINIC_OR_DEPARTMENT_OTHER): Payer: Self-pay

## 2022-03-31 ENCOUNTER — Other Ambulatory Visit (HOSPITAL_BASED_OUTPATIENT_CLINIC_OR_DEPARTMENT_OTHER): Payer: Self-pay

## 2022-04-01 ENCOUNTER — Ambulatory Visit (HOSPITAL_COMMUNITY)
Admission: RE | Admit: 2022-04-01 | Discharge: 2022-04-01 | Disposition: A | Discharge status: Home / Self Care | Payer: Medicare Other | Source: Ambulatory Visit | Attending: Neurology | Admitting: Neurology

## 2022-04-01 DIAGNOSIS — K219 Gastro-esophageal reflux disease without esophagitis: Secondary | ICD-10-CM | POA: Insufficient documentation

## 2022-04-01 DIAGNOSIS — G4731 Primary central sleep apnea: Secondary | ICD-10-CM | POA: Insufficient documentation

## 2022-04-01 DIAGNOSIS — R1312 Dysphagia, oropharyngeal phase: Secondary | ICD-10-CM | POA: Diagnosis present

## 2022-04-01 DIAGNOSIS — M0609 Rheumatoid arthritis without rheumatoid factor, multiple sites: Secondary | ICD-10-CM | POA: Diagnosis present

## 2022-04-01 DIAGNOSIS — J040 Acute laryngitis: Secondary | ICD-10-CM | POA: Diagnosis present

## 2022-04-01 DIAGNOSIS — R490 Dysphonia: Secondary | ICD-10-CM | POA: Insufficient documentation

## 2022-04-01 DIAGNOSIS — G629 Polyneuropathy, unspecified: Secondary | ICD-10-CM | POA: Diagnosis present

## 2022-04-01 DIAGNOSIS — Z9989 Dependence on other enabling machines and devices: Secondary | ICD-10-CM | POA: Diagnosis present

## 2022-04-01 MED ORDER — GADOBUTROL 1 MMOL/ML IV SOLN
7.0000 mL | Freq: Once | INTRAVENOUS | Status: AC | PRN
  Administered 2022-04-01: 7 mL via INTRAVENOUS

## 2022-04-06 ENCOUNTER — Telehealth: Payer: Self-pay | Admitting: *Deleted

## 2022-04-06 NOTE — Telephone Encounter (Signed)
-----   Message from Melvenia Beam, MD sent at 04/06/2022 10:06 AM EST ----- Let him know: MRI of the brain is bascially normal for age, nothing acute; generalized atrophy and white matter changes (atherosclerosis of the small blood vessels) is a normal process of aging throughout the body and yours is mildly advanced for age which is essentially normal.  other things that can cause progressive atrophy and chronic microvascular schema are things like diabetes or high blood pressure or sleep apnea or high cholesterol and I would encourage you to manage that if you have it.  Also you have an right maxillary sinus disease which is incidental, if you are symptomatic you have congestion, pain on that right side, I would follow-up with primary care.

## 2022-04-13 NOTE — Progress Notes (Signed)
Remote pacemaker transmission.   

## 2022-04-25 ENCOUNTER — Other Ambulatory Visit (HOSPITAL_BASED_OUTPATIENT_CLINIC_OR_DEPARTMENT_OTHER): Payer: Self-pay

## 2022-04-25 MED ORDER — CEFDINIR 300 MG PO CAPS
300.0000 mg | ORAL_CAPSULE | Freq: Two times a day (BID) | ORAL | 0 refills | Status: DC
  Filled 2022-04-25: qty 14, 7d supply, fill #0

## 2022-05-04 ENCOUNTER — Encounter: Payer: Self-pay | Admitting: Internal Medicine

## 2022-05-14 ENCOUNTER — Other Ambulatory Visit (HOSPITAL_BASED_OUTPATIENT_CLINIC_OR_DEPARTMENT_OTHER): Payer: Self-pay

## 2022-05-14 ENCOUNTER — Other Ambulatory Visit: Payer: Self-pay | Admitting: Physician Assistant

## 2022-05-14 DIAGNOSIS — M0609 Rheumatoid arthritis without rheumatoid factor, multiple sites: Secondary | ICD-10-CM

## 2022-05-14 DIAGNOSIS — Z79899 Other long term (current) drug therapy: Secondary | ICD-10-CM

## 2022-05-16 ENCOUNTER — Other Ambulatory Visit (HOSPITAL_BASED_OUTPATIENT_CLINIC_OR_DEPARTMENT_OTHER): Payer: Self-pay

## 2022-05-16 MED ORDER — METHOTREXATE SODIUM 2.5 MG PO TABS
ORAL_TABLET | ORAL | 0 refills | Status: DC
  Filled 2022-05-16: qty 60, 84d supply, fill #0

## 2022-05-16 NOTE — Telephone Encounter (Addendum)
Next Visit: 07/19/2022  Last Visit: 02/01/2022  Last Fill: 03/01/2022  DX: Rheumatoid arthritis of multiple sites without rheumatoid factor   Current Dose per office note on 02/01/2022: MTX 5 tabs po once weekly  Labs: 01/27/2022 CBC WNL. Glucose is 114. Rest of CMP stable.    Attempted to contact patient and left message to advise patient he is due to update labs. Please review and sign pended lab orders as well.   Okay to refill MTX?

## 2022-05-19 ENCOUNTER — Other Ambulatory Visit: Payer: Self-pay | Admitting: *Deleted

## 2022-05-19 DIAGNOSIS — Z79899 Other long term (current) drug therapy: Secondary | ICD-10-CM

## 2022-05-20 LAB — COMPLETE METABOLIC PANEL WITH GFR
AG Ratio: 1.7 (calc) (ref 1.0–2.5)
ALT: 12 U/L (ref 9–46)
AST: 9 U/L — ABNORMAL LOW (ref 10–35)
Albumin: 4.3 g/dL (ref 3.6–5.1)
Alkaline phosphatase (APISO): 64 U/L (ref 35–144)
BUN: 14 mg/dL (ref 7–25)
CO2: 33 mmol/L — ABNORMAL HIGH (ref 20–32)
Calcium: 9.7 mg/dL (ref 8.6–10.3)
Chloride: 99 mmol/L (ref 98–110)
Creat: 0.77 mg/dL (ref 0.70–1.22)
Globulin: 2.5 g/dL (calc) (ref 1.9–3.7)
Glucose, Bld: 112 mg/dL — ABNORMAL HIGH (ref 65–99)
Potassium: 5.3 mmol/L (ref 3.5–5.3)
Sodium: 140 mmol/L (ref 135–146)
Total Bilirubin: 0.6 mg/dL (ref 0.2–1.2)
Total Protein: 6.8 g/dL (ref 6.1–8.1)
eGFR: 88 mL/min/{1.73_m2} (ref >=60)

## 2022-05-20 LAB — CBC WITH DIFFERENTIAL/PLATELET
Absolute Monocytes: 488 cells/uL (ref 200–950)
Basophils Absolute: 20 cells/uL (ref 0–200)
Basophils Relative: 0.5 %
Eosinophils Absolute: 60 cells/uL (ref 15–500)
Eosinophils Relative: 1.5 %
HCT: 41.6 % (ref 38.5–50.0)
Hemoglobin: 14.2 g/dL (ref 13.2–17.1)
Lymphs Abs: 1476 cells/uL (ref 850–3900)
MCH: 32.7 pg (ref 27.0–33.0)
MCHC: 34.1 g/dL (ref 32.0–36.0)
MCV: 95.9 fL (ref 80.0–100.0)
MPV: 9.8 fL (ref 7.5–12.5)
Monocytes Relative: 12.2 %
Neutro Abs: 1956 cells/uL (ref 1500–7800)
Neutrophils Relative %: 48.9 %
Platelets: 229 10*3/uL (ref 140–400)
RBC: 4.34 10*6/uL (ref 4.20–5.80)
RDW: 12 % (ref 11.0–15.0)
Total Lymphocyte: 36.9 %
WBC: 4 10*3/uL (ref 3.8–10.8)

## 2022-05-20 NOTE — Progress Notes (Signed)
CBC and CMP are stable.

## 2022-05-27 ENCOUNTER — Telehealth: Payer: Self-pay | Admitting: Internal Medicine

## 2022-05-27 ENCOUNTER — Telehealth: Payer: Self-pay | Admitting: Cardiology

## 2022-05-27 DIAGNOSIS — R911 Solitary pulmonary nodule: Secondary | ICD-10-CM

## 2022-05-27 NOTE — Telephone Encounter (Signed)
Spoke with pt, the patient is due for a CT wo contrast to follow up lung nodule in march 2024 and he is due for a CTA for thoracic aneurysm in march of 2025. The patient feels this is too many CT scans and wonders if he can just do one in 18 months. When I tried to explain to the patient he did not want to talk to me he wanted to know what dr hochrein wanted him to do. Will forward to dr hochrein.

## 2022-05-27 NOTE — Telephone Encounter (Signed)
Patient states during his last office visit Dr. Percival Spanish recommended having a test done in 1 year. He does not know what type of test was recommended and there are no orders in the system. Please return call to patient to discuss.

## 2022-05-27 NOTE — Telephone Encounter (Signed)
Spoke with patient. Advised what MD note said:    AORTIC ROOT DILATATION.    This was 39 mm on CT last week.  I will follow this up again in 2 years.   PULMONARY NODULES: He is low risk and so I will follow-up with noncontrasted CT in 1 year   Routed to MD to review/advise. Does he prefer just the non-contrast CT now or want to do CT angiogram to follow up on both.

## 2022-05-27 NOTE — Telephone Encounter (Signed)
Pt scheduled to see Dr. Hilarie Fredrickson 06/17/22 at 3:40pm. Please let pt know about the appt.

## 2022-05-27 NOTE — Telephone Encounter (Signed)
Patient is calling trying to schedule but is upset that Dr Hilarie Fredrickson has no availability as of right now given that there was a letter mailed to him telling him to schedule. Wondering if there is anything else we can do, please advise.

## 2022-05-30 ENCOUNTER — Other Ambulatory Visit (HOSPITAL_BASED_OUTPATIENT_CLINIC_OR_DEPARTMENT_OTHER): Payer: Self-pay

## 2022-05-30 NOTE — Telephone Encounter (Signed)
LVM to let pt know about appt.

## 2022-05-31 NOTE — Telephone Encounter (Signed)
Patient aware of dr hochrein's recommendations. Order placed for CT wo to follow up on lung nodule.

## 2022-06-03 ENCOUNTER — Encounter: Payer: Self-pay | Admitting: *Deleted

## 2022-06-06 ENCOUNTER — Encounter (HOSPITAL_BASED_OUTPATIENT_CLINIC_OR_DEPARTMENT_OTHER): Payer: Self-pay | Admitting: Pharmacist

## 2022-06-06 ENCOUNTER — Other Ambulatory Visit: Payer: Self-pay | Admitting: Rheumatology

## 2022-06-06 ENCOUNTER — Other Ambulatory Visit (HOSPITAL_BASED_OUTPATIENT_CLINIC_OR_DEPARTMENT_OTHER): Payer: Self-pay

## 2022-06-13 ENCOUNTER — Other Ambulatory Visit: Payer: Self-pay | Admitting: Rheumatology

## 2022-06-13 NOTE — Telephone Encounter (Signed)
Pt wanted to make sure Dr. Norman Herrlich had access to the MRI he has and his swallowing studies. He also stated that if he needs speech path he would like the order sent to [facility]. He plans on keeping his OV as scheduled.

## 2022-06-13 NOTE — Telephone Encounter (Signed)
Inbound call from patient requesting to speak with a nurse before his upcoming OV  with provider. Patient states he has some issues he would like to address beforehand. Please advise.  Thank you

## 2022-06-14 ENCOUNTER — Ambulatory Visit (INDEPENDENT_AMBULATORY_CARE_PROVIDER_SITE_OTHER): Payer: Medicare Other

## 2022-06-14 DIAGNOSIS — I442 Atrioventricular block, complete: Secondary | ICD-10-CM | POA: Diagnosis not present

## 2022-06-14 LAB — CUP PACEART REMOTE DEVICE CHECK
Battery Remaining Longevity: 71 mo
Battery Voltage: 2.97 V
Brady Statistic AP VP Percent: 9.85 %
Brady Statistic AP VS Percent: 0 %
Brady Statistic AS VP Percent: 86.92 %
Brady Statistic AS VS Percent: 3.22 %
Brady Statistic RA Percent Paced: 10.82 %
Brady Statistic RV Percent Paced: 96.78 %
Date Time Interrogation Session: 20240305032913
Implantable Lead Connection Status: 753985
Implantable Lead Connection Status: 753985
Implantable Lead Implant Date: 20180827
Implantable Lead Implant Date: 20180827
Implantable Lead Location: 753859
Implantable Lead Location: 753860
Implantable Lead Model: 4076
Implantable Lead Model: 4076
Implantable Pulse Generator Implant Date: 20180827
Lead Channel Impedance Value: 323 Ohm
Lead Channel Impedance Value: 361 Ohm
Lead Channel Impedance Value: 380 Ohm
Lead Channel Impedance Value: 437 Ohm
Lead Channel Pacing Threshold Amplitude: 0.625 V
Lead Channel Pacing Threshold Amplitude: 0.875 V
Lead Channel Pacing Threshold Pulse Width: 0.4 ms
Lead Channel Pacing Threshold Pulse Width: 0.4 ms
Lead Channel Sensing Intrinsic Amplitude: 14.375 mV
Lead Channel Sensing Intrinsic Amplitude: 14.375 mV
Lead Channel Sensing Intrinsic Amplitude: 3.125 mV
Lead Channel Sensing Intrinsic Amplitude: 3.125 mV
Lead Channel Setting Pacing Amplitude: 1.5 V
Lead Channel Setting Pacing Amplitude: 2.5 V
Lead Channel Setting Pacing Pulse Width: 0.4 ms
Lead Channel Setting Sensing Sensitivity: 4 mV
Zone Setting Status: 755011

## 2022-06-17 ENCOUNTER — Encounter: Payer: Self-pay | Admitting: Internal Medicine

## 2022-06-17 ENCOUNTER — Ambulatory Visit (INDEPENDENT_AMBULATORY_CARE_PROVIDER_SITE_OTHER): Payer: Medicare Other | Admitting: Internal Medicine

## 2022-06-17 VITALS — BP 112/70 | HR 66 | Ht 73.0 in | Wt 153.2 lb

## 2022-06-17 DIAGNOSIS — K219 Gastro-esophageal reflux disease without esophagitis: Secondary | ICD-10-CM

## 2022-06-17 DIAGNOSIS — R1312 Dysphagia, oropharyngeal phase: Secondary | ICD-10-CM | POA: Diagnosis not present

## 2022-06-17 DIAGNOSIS — T17908D Unspecified foreign body in respiratory tract, part unspecified causing other injury, subsequent encounter: Secondary | ICD-10-CM

## 2022-06-17 NOTE — Progress Notes (Signed)
   Subjective:    Patient ID: Jeffery [name], male    DOB: 1934-07-28, 88 y.o.   MRN: [id number]  HPI Jeffery [name] is an 88 year old male with a history of GERD, oropharyngeal dysphagia and aspiration, remote H. pylori associated duodenal ulcer, remote colon polyps, colonic diverticulosis, sleep apnea, previous prostate cancer, diabetes, hyperlipidemia and rheumatoid arthritis who is here for follow-up.  I last saw him for upper endoscopy on 12/27/2021.  He has been doing well.  He and his wife have moved out of their long-term home to [facility].  This has been a positive thing.  He is having some intermittent solid food dysphagia though food does not really stick he just feels a pressure in his lower chest.  This is better than it was when he was seen for his upper endoscopy.  He feels that this happens mostly with breads when he eats too quickly.  He is not having heartburn and not taking an acid suppression regimen.  His bowel movements are normal without blood or melena.  He has noticed some dysphonia as well as oropharyngeal aspiration.  This was highlighted on the barium esophagram and he then had speech and swallow therapy evaluation as well as neurology evaluation.  He had an MRI of his brain which was largely unremarkable but did show some cerebral atrophy slightly advanced for age.   Review of Systems As per HPI, otherwise negative  Current Medications, Allergies, Past Medical History, Past Surgical History, Family History and Social History were reviewed in [employer] record.    Objective:   Physical Exam BP 112/70   Pulse 66   Ht 6\' 1"  (1.854 m)   Wt 153 lb 4 oz (69.5 kg)   BMI 20.22 kg/m  Gen: awake, alert, NAD HEENT: anicteric  Ext: no c/c/e Neuro: nonfocal      Assessment & Plan:  88 year old male with a history of GERD, oropharyngeal dysphagia and aspiration, remote H. pylori associated duodenal ulcer, remote colon polyps, colonic  diverticulosis, sleep apnea, previous prostate cancer, diabetes, hyperlipidemia and rheumatoid arthritis who is here for follow-up.  GERD --seen by barium esophagram but not seen endoscopically.  Distal esophageal biopsies did not show inflammation and therefore PPI or H2 blocker is not recommended.  He is also not having heartburn to warrant therapy.  2.  Oropharyngeal dysphagia and aspiration --not esophageal.  He needs to resume speech and swallow therapy and he prefers to do this at his new living community [facility] -- Referral to [facility] speech and swallow therapy for ongoing treatment; we will send along the modified barium esophagram and speech and swallow initial consult for their information  He can follow-up as needed  20 minutes total spent today including patient facing time, coordination of care, reviewing medical history/procedures/pertinent radiology studies, and documentation of the encounter.

## 2022-06-17 NOTE — Patient Instructions (Addendum)
You have been referred to [facility] speech and swallow therapy for treatment.   _______________________________________________________  If your blood pressure at your visit was 140/90 or greater, please contact your primary care physician to follow up on this.  _______________________________________________________  If you are age 86 or older, your body mass index should be between 23-30. Your Body mass index is 20.22 kg/m. If this is out of the aforementioned range listed, please consider follow up with your Primary Care Provider.  If you are age 29 or younger, your body mass index should be between 19-25. Your Body mass index is 20.22 kg/m. If this is out of the aformentioned range listed, please consider follow up with your Primary Care Provider.   ________________________________________________________  The [facility] providers would like to encourage you to use [facility] to communicate with providers for non-urgent requests or questions.  Due to long hold times on the telephone, sending your provider a message by [facility] may be a faster and more efficient way to get a response.  Please allow 48 business hours for a response.  Please remember that this is for non-urgent requests.  _______________________________________________________  Due to recent changes in healthcare laws, you may see the results of your imaging and laboratory studies on MyChart before your provider has had a chance to review them.  We understand that in some cases there may be results that are confusing or concerning to you. Not all laboratory results come back in the same time frame and the provider may be waiting for multiple results in order to interpret others.  Please give [place] 48 hours in order for your provider to thoroughly review all the results before contacting the office for clarification of your results.

## 2022-06-20 ENCOUNTER — Encounter: Payer: Self-pay | Admitting: Internal Medicine

## 2022-06-20 ENCOUNTER — Telehealth: Payer: Self-pay | Admitting: Internal Medicine

## 2022-06-20 NOTE — Telephone Encounter (Signed)
Signed order and previous swallowing studies have been faxed to [facility] at fax [phone].

## 2022-06-20 NOTE — Telephone Encounter (Signed)
PT was given a referral to speech pathology and they have advised they would need the signed order and the evaluations faxed over to [phone]

## 2022-06-21 ENCOUNTER — Other Ambulatory Visit (HOSPITAL_BASED_OUTPATIENT_CLINIC_OR_DEPARTMENT_OTHER): Payer: Self-pay

## 2022-06-30 ENCOUNTER — Ambulatory Visit
Admission: RE | Admit: 2022-06-30 | Discharge: 2022-06-30 | Disposition: A | Discharge status: Home / Self Care | Payer: Medicare Other | Source: Ambulatory Visit | Attending: Cardiology | Admitting: Cardiology

## 2022-06-30 DIAGNOSIS — R911 Solitary pulmonary nodule: Secondary | ICD-10-CM

## 2022-07-04 ENCOUNTER — Other Ambulatory Visit (HOSPITAL_BASED_OUTPATIENT_CLINIC_OR_DEPARTMENT_OTHER): Payer: Self-pay

## 2022-07-04 ENCOUNTER — Encounter: Payer: Self-pay | Admitting: Neurology

## 2022-07-04 ENCOUNTER — Ambulatory Visit: Payer: Medicare Other | Admitting: Family Medicine

## 2022-07-04 ENCOUNTER — Ambulatory Visit (INDEPENDENT_AMBULATORY_CARE_PROVIDER_SITE_OTHER): Payer: Medicare Other | Admitting: Neurology

## 2022-07-04 VITALS — BP 156/74 | HR 72 | Ht 74.0 in | Wt 154.0 lb

## 2022-07-04 DIAGNOSIS — I77810 Thoracic aortic ectasia: Secondary | ICD-10-CM | POA: Diagnosis not present

## 2022-07-04 DIAGNOSIS — M0609 Rheumatoid arthritis without rheumatoid factor, multiple sites: Secondary | ICD-10-CM | POA: Diagnosis not present

## 2022-07-04 DIAGNOSIS — I442 Atrioventricular block, complete: Secondary | ICD-10-CM | POA: Diagnosis not present

## 2022-07-04 DIAGNOSIS — G4731 Primary central sleep apnea: Secondary | ICD-10-CM | POA: Diagnosis not present

## 2022-07-04 DIAGNOSIS — C61 Malignant neoplasm of prostate: Secondary | ICD-10-CM

## 2022-07-04 MED ORDER — HYDROXYCHLOROQUINE SULFATE 200 MG PO TABS: 100.0000 mg | ORAL_TABLET | Freq: Every day | ORAL | 0 refills | Status: DC

## 2022-07-04 MED ORDER — FOLIC ACID 1 MG PO TABS
1.0000 mg | ORAL_TABLET | Freq: Every day | ORAL | 3 refills | Status: DC
  Filled 2022-07-04 – 2022-07-25 (×3): qty 90, 90d supply, fill #0
  Filled 2022-11-08: qty 90, 90d supply, fill #1
  Filled 2023-02-04: qty 90, 90d supply, fill #2
  Filled 2023-05-22: qty 90, 90d supply, fill #3

## 2022-07-04 NOTE — Patient Instructions (Signed)

## 2022-07-04 NOTE — Progress Notes (Signed)
[name]  Provider:  Larey Seat, MD.   Referring Provider: Minus Breeding, MD Primary Care Physician:  Shon Baton, MD  Chief Complaint  Patient presents with   Follow-up    Pt alone, rm 1. Here for follow up visit from October. He completed MRI which was reviewed by Dr Jaynee Eagles in Dr Trini Christiansen's absence. He states his speech/swallowing concerns are better. Around that time he had concerns r/t memory which he states no longer an issue. He scored 30/30 on testing last time. He didn't feel that was something of concern. Overall states doing well. He has not been using BiPAP   RV 07-04-2022: RV . Patient has stopped using BiPAP.  He sleeps better at night. And feels not sleepy in daytime.  [facility] has been a good choice of residence for both , he had speech therapy, his wife is grieving a bit about moving from their decades old homestead.  Chrys Racer is happy that their daughter and SIL are moving in.  He is not feeling memory challenged, while Chrys Racer has progressed into more cognitive challenges. The couple has implemented all the changes - social and physical activities, diet and exercise.    RV on 02-02-2022:Patient is a long time established  sleep apnea patient , age 88 , and has been concerned about his inability to use BiPAP compliantly,  to the dismay of his neurologist ( me)  and his retinal specialist , Dr Zadie Rhine. He noted no memory trouble, but has been more tired and fatigued, he has noted some slurring of speech- especially later in the day, dysphonia ( hoarseness) and dysphagia ( feeling food getting stuck in the back of his throat) , no diplopia. He is unaware of reflux. He does cough after eating, sometimes sneezing, nose is not running. Clearing his throat after solid and liquids. Particular food - rice,  popcorn, aspiration  He has become non-compliant with PAP, he had barium swallow test and endoscopy with Dr Hilarie Fredrickson, MD [facility]. He taped 2 voice  documents one in AM and one in PM, these showed well modulated speech and interruption to take breath, not slurring, he was actually clearer in PM.  Not a fatigue relation.     Endoscopy 12-29-2021 with Dr Hilarie Fredrickson showed candidiasis.  First barium swallow test 12-07-2021: abnormal GI reflux and second test 01-18-2022:  12/07/2021 the test was done under Billie Lade MD barium phagiogram. " Barium swallow" he ingested a 13 mm barium tablet that passed into the stomach without significant delay, moderate gastroesophageal reflux was noted after the swallowing and moderate aspiration was noted which was not silent.   Then in the second study from October tends there were different consistencies of barium administered under fluoroscopy.  The patient aspirated small amount of liquid barium contrast but he was able to swallow the 13 mm barium tablet.  So the barium tablet passed over the vallecular space, it cleared completely with a second bolus of pudding.  The study concluded that he is in his elevated risk of aspiration if it if he becomes acutely ill or deconditioned.  And the sleep speech therapist also said that the patient's speech and articulation and resignation worsened at the end of the test there was an impairment and this would account for the lack fluctuation of his speech difficulties.    Chin tuck did help and was effective to prevent retention.  The epiglottic deflection was compromised =the airway closure was therefore compromised. The question is why ? Motor  system is not the smooth muscle striate      RV and interval history: 04-16-2020: Mr Jenetta Downer 'Laurence Slate reports wanting to get a baseline. He just had seen dr Vertell Limber who enforced having PT instead of surgery.  MOCA today 26/30 normal range and loss of 2 points on immediate recall.     02/02/2022    1:36 PM 04/16/2020    2:23 PM  Montreal Cognitive Assessment   Visuospatial/ Executive (0/5) 5 5  Naming (0/3) 3 2  Attention: Read list of digits (0/2)  2 1  Attention: Read list of letters (0/1) 1 1  Attention: Serial 7 subtraction starting at 100 (0/3) 3 3  Language: Repeat phrase (0/2) 2 2  Language : Fluency (0/1) 1 1  Abstraction (0/2) 2 2  Delayed Recall (0/5) 5 3  Orientation (0/6) 6 6  Total 30 26   He had been placed on BiPAP for complex apnea and achieved an AHI of 3.1/h but has not used the machine ( due to recall )in a while he never used an ozone cleaner and feel he can use it. The geriatric depression score was  endorsed at 2 points. night blindness. Dysesthesias resolved under steroids, bradycardia on pacemaker. No fainting spells, or near to such.  BP was elevated today.        RV on 08-26-2019; I have the pleasure of meeting with [name] today on 26 Aug 2019 patient is an 88 year old Caucasian gentleman who has been followed for CPAP therapy as well as neuropathy.  Previous sleep studies had found primary central sleep apnea which responded to CPAP therapy but was in the last several months they have been rising levels of residual AHI currently to 8.45 using CPAP as an AutoSet.  Therefore the patient was invited to return and see if he can actually titrate him to BiPAP.  We used a nonmagnetic Clapacs near the right ear a full face medium and first began with a CPAP titration from 5-14 cmH2O it did not reduce the AHI significantly changed to BiPAP was made beginning at 14/10 cmH2O and immediately reducing the residual apnea to 1.5/h the best result was seen on 15/11 centimeters water pressure it seems that for this complex sleep apnea and for the periodic limb movements BiPAP therapy controlled both.  Also there was a reduction of the hypoxemia time to 29 to 24 minutes only which is much less than in the sleep study.  I would very much be in favor to change therapy to BiPAP and if possible to use a range between 14/10 up to 19/15 cmH2O to allow also adjustment depending on supine or nonsupine sleep position.he is between medium  and large size for the Vitera mask.   I expected him to be awaiting the BiPAP to be issued.  He would consider rather bying ASV outright. He needs to return for a night. ASV.  I will explore.    [name], 76, presents after he had an extensive visit with his ophthalmologist and retinal specialist , Dr. Zadie Rhine, Whom he sees for Plaquenil follow up. 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 Zadie Rhine asked the patient if he snores which she confirmed.  He also stated that treatment of obstructive sleep apnea may within a week resolve or alleviate the Tele-angiectasis.  Aside from the concern of snoring, there is also excessive daytime sleepiness  loaders now the patient endorsed the Epworth Sleepiness Scale at 16 out of 24 possible points, but fatigue severity was only endorsed at 39 points.The patient's body mass index has not increased, but decreased. He remains with a deviated septum.  [name] had been seen in 2011-sleep study obtained on 24 Aug 2009 confirmed an AHI also known as apnea hypopnea index of 27.7/h of sleep, equal to almost 1 respiratory event every 2 minutes.  Supine there was much accentuation to a supine AHI of 75.6/h, there was only slight accentuation in rem sleep.  Interesting is that the patient had 8 obstructive apneas alongside 124 central and 16 mixed apneas.  There were very few hypopneas.  Central apnea however is not always responsive to CPAP.  He returned for a CPAP and BiPAP titration on 14 September 2009 and did not tolerate or respond well to pressures between 4 and 8 cmH2O CPAP pressure.  Subsequently BiPAP was initiated he tried several masks settling on a full facemask at the most is the most acceptable.  There was no optimal pressure on BiPAP found either. He just couldn't sleep. Dr Lonzo Cloud interpreted and recommended 7 cm CPAP as  the best solution.   [name] is seen on 23 February 2017 and a follow-up visit after recent cervical spine MRI was obtained.  In the meantime he has received a pacemaker, and is followed by Dr. Rayann Heman through [facility].  Symptoms occurred in [place], [place] where he was diagnosed and his care was transferred here, he received a temporary pacemaker in [place] .  Treatment for severe bradycardia. diaphoretic and with palpitation, bowel incontinence.  In the interim he had on 11-28-2016 an MRI - he called Dr. Sherwood Gambler and has not made a follow up with him. Post-pacemaker implantation he had left arm and wrist pain, was treated with prednisone and has not had symptoms in neck and hands since! Marland Kitchen   08-21-2017, well adjusted to his pacemaker, no complications. No syncope, remains numb behind the ear. We will follow his complex apnea, neuropathy as needed.  No concerns of sleep , no pain.    11-01-2016. Cristobal H Jarif Nong is a 88 y.o. male seen here as a revisit  from Dr. Percival Spanish for a newly noted area of dysesthesia behind the left ear.  There is no numbness- the skin sensation is intermittently disturbed, tingly, like a fine touch. I evaluated the area and it does not quite fit a dermatomal distribution-Area is retroauricular and would be innervated by C2. There is no spasm.   This patient of Dr. Virgina Jock Dr. Estanislado Pandy has followed up since May 2011. Initially he was referred for a polysomnogram based on chronic fatigue complaints and was diagnosed as complex apnea.  The apnea short-billed complements central and obstructive, but in a distribution that was more typical for a with apnea.  They were neuropathy related labs sent out and 2011 as well as also to address chronic fatigue and all returned to normal range. The patient clearly has an accentuated AHI by position and during REM sleep. He has been healthy and maintains an active lifestyle is traveling is a nonsmoker nondrinker and has nausea history.  The patient responded well to Wellbutrin on only 100 mg daily taken to eliminate or reduce central apneas. During his last visit in 2013 his fatigue score was 20 and his Epworth score 14 points and he had reported one nocturia on average per night. He estimated his daily sleep time is  7 hours or more. He sometimes naps in the afternoons. This is still the case today:  his Epworth is 11 points, and fatigue score is 24. I would like to add that the patient is on chronic Plaquenil therapy. This was initiated after he developed severe muscle stiffness and relief this is the symptoms within 2-3 weeks. 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 Plaquenil  may contribute somewhat to the development of the neuropathy. Wellbutrin has had some positive effect on the level of alertness and daytime and may also be due discomfort.  03-03-15 [name] reports that he had taken a drug holiday and for about 6 months did not use Plaquenil. As the responds he noted again stiffness in his hands and inflammation. Dr. Estanislado Pandy , his rheumatologist, had placed him on his prednisone taper but then Plaquenil was reinitiated about 3 months ago. I would like to add that the stiffness has never affected his lower extremities or feet and has always been manifested in his hands. He also has 3 trigger fingers. Fatigue is stable. His Epworth score is endorsed at 9 points and his fatigue severity at 34 points. The geriatric depression score was  endorsed at 2 points.  We are seeing today after [name] suffered a sudden fall with a laceration of the left for head and fracture of the left thumb. He was walking briskly but on an uneven surface and initially believed that he tripped and fell. But he had as sudden premonition to slow down,  before he fell which tells me that he most likely fainted. He has no history of seizures, he was  able to break his fall but had no focal deficits [name] is a fall and also did not feel that he had problems was generating words, but his thought process was scrambled or that he had any focal motor deficits. This rules out a TIA.  Once he was on the ground he was immediately conscious and aware of his surroundings- he was not combative and he will actually waited patiently for EMS to arrive. The ED reports were reviewed here with him. A cardiac workup was initiated and his transthoracic echo cardiogram revealed no abnormalities.  He also underwent carotid Doppler studies which showed minor in my opinion trivial calcifications at the bifurcation. These would be in the range between 1 and 40% the nonsurgical indication range. I would like for the patient to take a baby aspirin is possibly enteric coated once a day should he develop bruising I will like him to take it every other day. I also discussed with him that we have never established at baseline for his neuropathy. I would like for him as early as available in the new year to undergo an EMG and nerve conduction study to evaluate the degree of peripheral neuropathy and axonal versus demyelinating quality of the neuropathy. The patient is interested. A recent comprehensive metabolic panel was Dr. Franchot Gallo was interpreted as normal , as well as a CBC diff  which she has every 3 months.   Review of Systems: Out of a complete 14 system review, the patient complains of only the following symptoms, and all other reviewed systems are negative. Intermittent fatigue. Fatigue is stable. His Epworth score is endorsed at 7 points and his fatigue severity at 27 points.  The geriatric depression score was  endorsed at 2 points. night blindness. Dysesthesias resolved under steroids, bradycardia on pacemaker.  Eye clinic at [facility] - Plaquenil (Hydroxychloroquine) Screening  I, the attending physician, performed the HPI with the patient and updated the  documentation appropriately. The patient is here for a return visit. The patient is here today for Plaquenil (Hydroxychloroquine) screening. Patient has Rheumatoid Arthritis. The patient has been taking Plaquenil (Hydroxychloroquine) for more than 5 years. The patient has been taking Plaquenil (Hydroxychloroquine) for approximately 7 years. The patient reports no changes in their vision. Known co-existing eye disease: macular degeneration.  The patient does not request a new glasses prescription. Review of eye symptoms reveals: Negative for flashes, eye pain and floaters.  Last edited by Hyman Hopes, MD on 08/17/2017 3:35 PM. (History)     Social History   Socioeconomic History   Marital status: Married    Spouse name: Not on file   Number of children: 2   Years of education: Not on file   Highest education level: Not on file  Occupational History   Not on file  Tobacco Use   Smoking status: Never    Passive exposure: Never   Smokeless tobacco: Never  Vaping Use   Vaping Use: Never used  Substance and Sexual Activity   Alcohol use: Not Currently   Drug use: No   Sexual activity: Not on file  Other Topics Concern   Not on file  Social History Narrative   Right handed, Retired .Married, 2 kids. Caffeine none.   3 Step    Social Determinants of Health   Financial Resource Strain: Not on file  Food Insecurity: Not on file  Transportation Needs: Not on file  Physical Activity: Not on file  Stress: Not on file  Social Connections: Not on file  Intimate Partner Violence: Not on file    Family History  Problem Relation Age of Onset   Heart failure Mother        Died age 42.  Apparently had valve surgery.    Congenital heart disease Brother    Stroke Maternal Grandfather    Colon cancer Neg Hx    Stomach cancer Neg Hx    Esophageal cancer Neg Hx    Rectal cancer Neg Hx     Past Medical History:  Diagnosis Date   Arthritis    Autoimmune disorder ([place])    BCC  (basal cell carcinoma)    BPH (benign prostatic hypertrophy) 2008   Cancer [facility]) 2008   Prostate   Cervical stenosis of spine    per patient    Complete heart block ([facility])    Complex sleep apnea syndrome    does not use CPAP   Diabetes mellitus without complication ([place])    Diverticulosis    ED (erectile dysfunction)    Gastritis    GERD (gastroesophageal reflux disease)    Hemorrhoids    Hernia, inguinal    Hyperlipidemia    IFG (impaired fasting glucose)    Osteopenia    Peptic ulcer disease with hemorrhage 1993   Shingles    Sleep apnea    Vitamin D deficiency     Past Surgical History:  Procedure Laterality Date   BACK SURGERY N/A 1984   barium swallow study  2023   x2   CATARACT EXTRACTION, BILATERAL     HERNIA REPAIR     LAMINECTOMY  1984   L 5   PACEMAKER IMPLANT  12/05/2016   MDT Azure XT DR MRI conditional PPM implanted for CHB by Dr Vista Deck in [place]  12/05/2016   PROSTATE SURGERY  12/2006   Robotic   TONSILLECTOMY  88 years old   TRIGGER FINGER RELEASE  03/01/2012   Procedure: MINOR RELEASE TRIGGER FINGER/A-1 PULLEY;  Surgeon: Cammie Sickle., MD;  Location: [place];  Service: Orthopedics;  Laterality: Left;  long finger   TRIGGER FINGER RELEASE Left 02/08/2018   Procedure: RELEASE TRIGGER FINGER/A-1 PULLEY LEFT RING FINGER;  Surgeon: Daryll Brod, MD;  Location: [place];  Service: Orthopedics;  Laterality: Left;   UPPER GI ENDOSCOPY  2023    Current Outpatient Medications  Medication Sig Dispense Refill   acetaminophen (TYLENOL) 500 MG tablet Take 500 mg by mouth every 6 (six) hours as needed.     cholecalciferol (VITAMIN D3) 25 MCG (1000 UNIT) tablet Take 1,000 Units by mouth daily.     diclofenac sodium (VOLTAREN) 1 % GEL Apply 2-4 grams to affected joint, up to 4 times daily. (Patient taking differently: Apply 2-4 g topically 4 (four) times daily as needed (affected joint).) A999333 g 2   folic  acid (FOLVITE) 1 MG tablet TAKE 1 TABLET BY MOUTH EVERY DAY 90 tablet 3   glucose blood test strip Use to check blood sugar 2 (two) times daily. 200 each 3   hydroxychloroquine (PLAQUENIL) 200 MG tablet Take 1 tablet (200 mg total) by mouth daily. 90 tablet 0   Lancets (ONETOUCH DELICA PLUS 123XX123) MISC Check blood sugar two times a day. 200 each 3   metFORMIN (GLUCOPHAGE) 500 MG tablet Take 500 mg daily by mouth.      Methotrexate Sodium (METHOTREXATE, PF,) 50 MG/2ML injection Administer 0.6 mL under the skin 1 time weekly. 8 mL 2   omega-3 acid ethyl esters (LOVAZA) 1 g capsule Take 1 capsule (1 g total) by mouth 3 (three) times a week. 36 capsule 0   ONE TOUCH ULTRA TEST test strip TEST BLOOD SUGAR BID AS DIRECTED  2   TUBERCULIN SYR 1CC/27GX1/2" (B-D TB SYRINGE 1CC/27GX1/2") 27G X 1/2" 1 ML MISC USE TO INJECT METHOTREXATE 12 each 3   vitamin E 100 UNIT capsule Take 100 Units by mouth daily.     zolpidem (AMBIEN) 5 MG tablet Take 5 mg by mouth as needed.     No current facility-administered medications for this visit.    MRI : IMPRESSION: 1. No acute intracranial abnormality. 2. Progressive generalized atrophy and white matter disease is mildly advanced for age. This likely reflects the sequela of chronic microvascular ischemia. 3. Right maxillary sinus disease.    Allergies as of 07/04/2022 - Review Complete 07/04/2022  Allergen Reaction Noted   Sulfasalazine Anaphylaxis 02/08/2016   Demerol [meperidine] Nausea Only 123XX123   Garlic  [id number]   Other  09/25/2017   Sulfa antibiotics  02/23/2017    Vitals: BP (!) 156/74   Pulse 72   Ht 6\' 2"  (1.88 m)   Wt 154 lb (69.9 kg)   BMI 19.77 kg/m  Last Weight:  Wt Readings from Last 1 Encounters:  07/04/22 154 lb (69.9 kg)   Last Height:   Ht Readings from Last 1 Encounters:  07/04/22 6\' 2"  (1.88 m)   DOB: Feb 22, 1935 MRN: [id number]        Physical exam: General: The patient is awake, alert and appears not in acute  distress. The patient is well groomed. Head: Normocephalic, atraumatic. Neck is supple. Mallampati 2 , neck circumference: 15 inches.   Cardiovascular:  Regular rate and rhythm, without  murmurs -without distended  neck veins. Respiratory: Lungs are clear to auscultation. Skin:  Without evidence of edema, or rash Trunk: BMI is normal. He lost weight by design.   Neurologic exam: The patient is awake and alert, oriented to place and time.   Memory subjective described as intact. There is a normal attention span & concentration ability.  Speech is fluent without dysarthria, dysphonia or aphasia.  Mood and affect are appropriate.  Cranial nerves: Pupils are equal and briskly reactive to light.  Funduscopic exam - quoted from Dr. Radene Ou notes.  Extraocular movements  in vertical and horizontal planes intact and without nystagmus. Visual fields by finger perimetry are intact.Hearing to finger rub intact.Facial sensation intact to fine touch.  Facial motor strength is symmetric , his  tongue and uvula move midline. Motor exam:  Low muscle mass.   Deferred.left grip strength is limited after trigger finger surgery.  Sensory:   I did not test the planta pedis.  Coordination: Rapid alternating movements in the fingers/hands is normal.  Finger-to-nose maneuver tested and normal without evidence of ataxia, dysmetria or tremor. Gait and station:  Stance is stable and normal. Deep tendon reflexes: in the extremities are attenuated , symmetric and intact.   Assessment/ Plan:   35 minute discussion- MOCA was in normal range last time and not repeated. .  After physical and neurologic examination, review of laboratory studies, imaging, neurophysiology testing and pre-existing records, assessment is   1)  I am not restarting BiPAP as his sleep was negatively affected. Epworth is endorsed at only 7 points, FSS at 24/ 63 points, GDS 3/ 15 points.    2) confirmed GI reflux, risk of aspiration-dysphagia-  delayed clearing of the throat.  Mouth dryness-  can tuck his chin and overcome the delayed epiglottis closure. Dysphonia, dysarthria. Speech therapy was successful. No dentures.    3) Memory : he tested by MOCA 30-30 points. Feels stable. MRI is for my impression  normal for age, he has small vessel disease and not any lacunes.   PS : He asked for his wife about REXULTI- we need to meet with her and retest her memory.     Larey Seat, MD. CC Dyke Brackett, MD and Shon Baton, MD

## 2022-07-06 NOTE — Progress Notes (Signed)
Office Visit Note  Patient: Jeffery [name]             Date of Birth: 1935-01-25           MRN: [id number]             PCP: Creola Corn, MD Referring: Rollene Rotunda, MD Visit Date: 07/19/2022 Occupation: @GUAROCC @  Subjective:  Medication management  History of Present Illness: Jeffery [name] is a 88 y.o. male with history of rheumatoid arthritis, polymyalgia rheumatica, degenerative disc disease and osteoarthritis.  He states he continues to have some fatigue and joint pain and stiffness.  He has not noticed any joint swelling.  Will decrease the dose of methotrexate to 4 tablets p.o. weekly and hydroxychloroquine 200 mg p.o. daily at the last visit.  He has not noticed any increased joint pain or joint swelling.  He continues to have some pain and stiffness in his entire spine.  He has been getting physical therapy at [facility].    Activities of Daily Living:  Patient reports morning stiffness for 1 hour.   Patient Reports nocturnal pain.  Difficulty dressing/grooming: Denies Difficulty climbing stairs: Denies Difficulty getting out of chair: Denies Difficulty using hands for taps, buttons, cutlery, and/or writing: Denies  Review of Systems  Constitutional:  Positive for fatigue.  HENT:  Negative for mouth sores and mouth dryness.   Eyes:  Negative for dryness.  Respiratory:  Negative for shortness of breath.   Cardiovascular:  Negative for chest pain and palpitations.  Gastrointestinal:  Negative for blood in stool, constipation and diarrhea.  Endocrine: Negative for increased urination.  Genitourinary:  Negative for involuntary urination.  Musculoskeletal:  Positive for muscle weakness, morning stiffness and muscle tenderness. Negative for joint pain, gait problem, joint pain, joint swelling, myalgias and myalgias.  Skin:  Negative for color change, rash, hair loss and sensitivity to sunlight.  Allergic/Immunologic: Negative for susceptible to infections.   Neurological:  Negative for dizziness and headaches.  Hematological:  Negative for swollen glands.  Psychiatric/Behavioral:  Negative for depressed mood and sleep disturbance. The patient is not nervous/anxious.     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   Cystoid macular edema of right eye 08/26/2019   Pseudophakia of both eyes 08/26/2019   Obstructive sleep apnea of adult 08/26/2019   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) 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 08/24/2019   Educated about COVID-19 virus infection 10/08/2018   Aortic root dilatation 10/08/2018   Mitral annular calcification 10/08/2018   Neuropathy 08/21/2017   Hydroxychloroquine causing adverse effect in therapeutic use 08/21/2017   Symptomatic bradycardia 02/23/2017   Status cardiac pacemaker 02/23/2017   CSA (central sleep apnea) 02/23/2017   Heart block AV complete 01/18/2017   Vitamin D deficiency 02/08/2016   Trigger finger, left middle finger 02/08/2016   Long-term use of Plaquenil 02/08/2016   Bursitis of right shoulder 02/08/2016   DDD (degenerative disc disease), lumbar 02/08/2016   DDD (degenerative disc disease), thoracic 02/08/2016   Rheumatoid arthritis of multiple sites without rheumatoid factor 11/28/2015  Class: Chronic   Other secondary osteoarthritis of both hands 11/28/2015    Class: Chronic   PMR (polymyalgia rheumatica) 11/28/2015    Class: Chronic   Osteoporosis 11/28/2015    Class: Chronic   Prostate cancer 11/28/2015    Class:  History of   Duodenal ulcer 11/28/2015    Class: Chronic   High cholesterol 11/28/2015   Dizziness 07/16/2015   Incomplete RBBB 03/31/2015   Sleep apnea, primary central 03/03/2015   Polyneuropathy (HCC) 01/24/2013   Right inguinal hernia 07/05/2012    Past Medical History:  Diagnosis Date   Arthritis    Autoimmune disorder    BCC (basal cell carcinoma)    BPH (benign prostatic hypertrophy) 2008   Cancer 2008   Prostate   Cervical stenosis of spine    per patient    Complete heart block    Complex sleep apnea syndrome    does not use CPAP   Diabetes mellitus without complication    Diverticulosis    ED (erectile dysfunction)    Gastritis    GERD (gastroesophageal reflux disease)    Hemorrhoids    Hernia, inguinal    Hyperlipidemia    IFG (impaired fasting glucose)    Osteopenia    Peptic ulcer disease with hemorrhage 1993   Shingles    Sleep apnea    Vitamin D deficiency     Family History  Problem Relation Age of Onset   Heart failure Mother        Died age 76.  Apparently had valve surgery.    Congenital heart disease Brother    Stroke Maternal Grandfather    Colon cancer Neg Hx    Stomach cancer Neg Hx    Esophageal cancer Neg Hx    Rectal cancer Neg Hx    Past Surgical History:  Procedure Laterality Date   BACK SURGERY N/A 1984   barium swallow study  2023   x2   CATARACT EXTRACTION, BILATERAL     HERNIA REPAIR     LAMINECTOMY  1984   L 5   PACEMAKER IMPLANT  12/05/2016   MDT Azure XT DR MRI conditional PPM implanted for CHB by Dr Barnie Mort in [place]   PACEMAKER INSERTION  12/05/2016   PROSTATE SURGERY  12/2006   Robotic   TONSILLECTOMY  88 years old   TRIGGER FINGER RELEASE  03/01/2012   Procedure: MINOR RELEASE TRIGGER FINGER/A-1 PULLEY;  Surgeon: [name], MD;  Location: [facility];  Service: Orthopedics;  Laterality: Left;  long finger   TRIGGER FINGER RELEASE Left 02/08/2018   Procedure: RELEASE TRIGGER FINGER/A-1 PULLEY  LEFT RING FINGER;  Surgeon: Cindee Salt, MD;  Location: [facility];  Service: Orthopedics;  Laterality: Left;   UPPER GI ENDOSCOPY  2023   Social History   Social History Narrative   Right handed, Retired .Married, 2 kids. Caffeine none.   3 Step    Immunization History  Administered Date(s) Administered   COVID-19, mRNA, vaccine(Comirnaty)12 years and older 03/17/2022   Influenza, High Dose Seasonal PF 03/03/2017, 01/26/2018   Moderna SARS-COV2 Booster Vaccination 10/15/2020   Moderna Sars-Covid-2 Vaccination 04/13/2019, 05/14/2019, 12/12/2019     Objective: Vital Signs: BP 114/69 (BP Location: Left Arm, Patient Position: Sitting, Cuff Size: Normal)   Pulse 82   Resp 16   Ht  (1.88 m)   Wt 157 lb 3.2 oz (71.3 kg)   BMI 20.18 kg/m    Physical Exam Vitals and nursing  note reviewed.  Constitutional:      Appearance: He is well-developed.  HENT:     Head: Normocephalic and atraumatic.  Eyes:     Conjunctiva/sclera: Conjunctivae normal.     Pupils: Pupils are equal, round, and reactive to light.  Cardiovascular:     Rate and Rhythm: Normal rate and regular rhythm.     Heart sounds: Normal heart sounds.     Comments: Pacemaker Pulmonary:     Effort: Pulmonary effort is normal.     Breath sounds: Normal breath sounds.  Abdominal:     General: Bowel sounds are normal.     Palpations: Abdomen is soft.  Musculoskeletal:     Cervical back: Normal range of motion and neck supple.  Skin:    General: Skin is warm and dry.     Capillary Refill: Capillary refill takes less than 2 seconds.  Neurological:     Mental Status: He is alert and oriented to person, place, and time.  Psychiatric:        Behavior: Behavior normal.      Musculoskeletal Exam: Limited rotation of the cervical spine was noted.  He had no tenderness over thoracic or lumbar spine.  Shoulders, elbows, wrist joints, MCPs PIPs and DIPs Juengel range of motion with no synovitis.  Hip joints  and knee joints in good range of motion.  There was no tenderness over ankles or MTPs.  No muscular weakness was noted.  CDAI Exam: CDAI Score: -- Patient Global: 2 mm; Provider Global: 2 mm Swollen: --; Tender: -- Joint Exam 07/19/2022   No joint exam has been documented for this visit   There is currently no information documented on the homunculus. Go to the Rheumatology activity and complete the homunculus joint exam.  Investigation: No additional findings.  Imaging: CT Chest Wo Contrast  Result Date: 07/02/2022 CLINICAL DATA:  Pulmonary nodules. EXAM: CT CHEST WITHOUT CONTRAST TECHNIQUE: Multidetector CT imaging of the chest was performed following the standard protocol without IV contrast. RADIATION DOSE REDUCTION: This exam was performed according to the departmental dose-optimization program which includes automated exposure control, adjustment of the mA and/or kV according to patient size and/or use of iterative reconstruction technique. COMPARISON:  06/29/2021 FINDINGS: Cardiovascular: The heart size is normal. No substantial pericardial effusion. Coronary artery calcification is evident. Mild atherosclerotic calcification is noted in the wall of the thoracic aorta. Ascending thoracic aorta measures 4 cm diameter. Mediastinum/Nodes: No mediastinal lymphadenopathy. No evidence for gross hilar lymphadenopathy although assessment is limited by the lack of intravenous contrast on the current study. The esophagus has normal imaging features. There is no axillary lymphadenopathy. Lungs/Pleura: Stable biapical pleuroparenchymal scarring including nodular components unchanged since prior. Index anterior left apical nodule measured previously at 9 mm is 9 mm again today on image 22/8. Left apical nodule measured previously at 8 mm is 8 mm again today on 19/8. Several additional tiny pulmonary nodules are unchanged. No new suspicious pulmonary nodule or mass. No focal airspace consolidation. Stable  left base chronic atelectasis or scarring adjacent 2 asymmetric elevation left hemidiaphragm. No pleural effusion. Upper Abdomen: Calcified gallstones evident. Musculoskeletal: No worrisome lytic or sclerotic osseous abnormality. IMPRESSION: 1. Stable exam. No new or progressive findings. 2. Stable biapical pleuroparenchymal scarring including nodular components identified previously. CT at 12 months (from today's scan) is considered optional for low-risk patients, but is recommended for high-risk patients. This recommendation follows the consensus statement: Guidelines for Management of Incidental Pulmonary Nodules Detected on CT Images: From the  Fleischner Society 2017; Radiology 2017; 284:228-243. 3. 4.0 cm diameter ascending thoracic aorta. Recommend annual imaging followup by CTA or MRA. This recommendation follows 2010 ACCF/AHA/AATS/ACR/ASA/SCA/SCAI/SIR/STS/SVM Guidelines for the Diagnosis and Management of Patients with Thoracic Aortic Disease. Circulation. 2010; 121: U725-D664. Aortic aneurysm NOS (ICD10-I71.9) 4. Cholelithiasis. 5.  Aortic Atherosclerosis (ICD10-I70.0). Electronically Signed   By: Kennith Center M.D.   On: 07/02/2022 10:13    Recent Labs: Lab Results  Component Value Date   WBC 4.0 05/19/2022   HGB 14.2 05/19/2022   PLT 229 05/19/2022   NA 140 05/19/2022   K 5.3 05/19/2022   CL 99 05/19/2022   CO2 33 (H) 05/19/2022   GLUCOSE 112 (H) 05/19/2022   BUN 14 05/19/2022   CREATININE 0.77 05/19/2022   BILITOT 0.6 05/19/2022   ALKPHOS 85 03/19/2019   AST 9 (L) 05/19/2022   ALT 12 05/19/2022   PROT 6.8 05/19/2022   ALBUMIN 4.7 (H) 03/19/2019   CALCIUM 9.7 05/19/2022   GFRAA 102 08/10/2020    Speciality Comments: PLQ Eye Exam: 05/09/2022 WNL @ Dr. Nile Riggs (in [facility]). Follow up in 1 year  Procedures:  No procedures performed Allergies: Sulfasalazine, Demerol [meperidine], Garlic, Other, and Sulfa antibiotics   Assessment / Plan:     Visit Diagnoses: Rheumatoid  arthritis of multiple sites without rheumatoid factor - RF negative, CCP negative, 1433 eta negative, ANA negative.  He has been doing well on reduced dose of methotrexate 4 tablets p.o. q. weekly along with hydroxychloroquine 200 mg p.o. daily.  He has not experienced any flares.  We discussed reducing the dose of methotrexate to 4 tablets p.o. weekly.  He had no synovitis on the examination today.  High risk medication use - MTX 5 tabs po once weekly, folic acid 1 mg po daily, PLQ 200 mg BID. PLQ Eye Exam: May 09, 2022 by Dr. Nile Riggs.  Labs obtained on May 19, 2022 were reviewed.  CBC and CMP were normal.  He was advised to get labs in May and then every 3 months to monitor for drug toxicity.  Information on immunization was placed in the AVS.  He was advised to stop methotrexate if he develops an infection resume after the infection resolves.  PMR (polymyalgia rheumatica)-no muscular weakness or tenderness was noted.  Primary osteoarthritis of both hands-he has bilateral PIP and DIP thickening.  No synovitis was noted.  He uses diclofenac gel as needed.  Prescription refill for diclofenac gel was given.  DDD (degenerative disc disease), cervical-he has not Ro range of motion of the cervical spine on lateral rotation.  DDD (degenerative disc disease), thoracic-he has intermittent chronic discomfort.  DDD (degenerative disc disease), lumbar-intermittent chronic discomfort.  Other fatigue-he continues to have mild fatigue.  He had weight loss over the last 12 months.  He states that in the last few months his weight has been stable and he has gained some weight in the last 2 months.  Osteopenia of multiple sites - 03/21 T -1.9.  He is taking calcium and vitamin D and has been off bisphosphonates.  Calcium rich diet and vitamin D was emphasized.  History of vitamin D deficiency-he has been taking vitamin D over-the-counter.  Other medical problems listed as follows:  Gait instability-he  noted improvement from physical therapy.  History of prostate cancer  History of diabetes mellitus  Heart block AV complete  History of sleep apnea  Orders: No orders of the defined types were placed in this encounter.  Meds ordered this encounter  Medications   hydroxychloroquine (PLAQUENIL) 200 MG tablet    Sig: Take 1 tablet (200 mg total) by mouth daily.    Dispense:  90 tablet    Refill:  0   diclofenac Sodium (VOLTAREN) 1 % GEL    Sig: Apply 2-4 grams to affected joint 4 times daily as needed.    Dispense:  400 g    Refill:  2     Follow-Up Instructions: Return in about 5 months (around 12/19/2022) for Rheumatoid arthritis, Osteoarthritis.   Pollyann Savoy, MD  Note - This record has been created using Animal nutritionist.  Chart creation errors have been sought, but may not always  have been located. Such creation errors do not reflect on  the standard of medical care.

## 2022-07-11 ENCOUNTER — Other Ambulatory Visit (HOSPITAL_BASED_OUTPATIENT_CLINIC_OR_DEPARTMENT_OTHER): Payer: Self-pay

## 2022-07-17 NOTE — Progress Notes (Unsigned)
Cardiology Office Note:   Date:  07/18/2022  ID:  Jeffery [name], [place] [birth year], MRN [id number]  History of Present Illness:   Jeffery [name] is a 88 y.o. male who presents for follow up of third degree HB.  He has a pacemaker per Dr. Johney Frame.  He had a follow up echo in December 2021.  He has mildly enlarged aortic root.  He had a CT last mont (06/2022) with a 40 mm aorta with small pulmonary nodules.  Of note he did have some aortic atherosclerosis, coronary calcium as noted previously on echo mitral annular calcification.  He presents for follow-up.  Since I saw him he removed to [facility].  His biggest issue continues to be leg weakness.  He is without physical therapy.  He is doing some exercising classes.  He does walk daily and does some balance exercise and walks with a cane.  The patient denies any new symptoms such as chest discomfort, neck or arm discomfort. There has been no new shortness of breath, PND or orthopnea. There have been no reported palpitations, presyncope or syncope.   ROS: Positive for back pain and fatigue. Otherwise as stated in the HPI and negative for all other systems.  Studies Reviewed:    EKG: Sinus rhythm with ventricular pacing 100% capture rate 70  Risk Assessment/Calculations:    Physical Exam:   VS:  BP (!) 150/76   Pulse 67   Ht 6\' 2"  (1.88 m)   Wt 156 lb 9.6 oz (71 kg)   SpO2 98%   BMI 20.11 kg/m    Wt Readings from Last 3 Encounters:  07/18/22 156 lb 9.6 oz (71 kg)  07/04/22 154 lb (69.9 kg)  06/17/22 153 lb 4 oz (69.5 kg)     GEN: Well nourished, well developed in no acute distress NECK: No JVD; No carotid bruits CARDIAC: RRR, no murmurs, rubs, gallops RESPIRATORY:  Clear to auscultation without rales, wheezing or rhonchi  ABDOMEN: Soft, non-tender, non-distended EXTREMITIES:  No edema; No deformity   ASSESSMENT AND PLAN:   CHB:   He is up-to-date with pacemaker follow-up and I reviewed the March 5th device check for this appt.  I will set him up with an EP physician as he has not seen 1 since Dr. Johney Frame   SLEEP APNEA:    He has BiPAP now but is not using it routinely and is going to talk to Dr Vickey Huger about this. .   I reviewed the 07/04/2022 note for this visit.  HTN:     His blood pressure is elevated but this is unusual.  He says it is usually well managed and he is very keep an eye on it and send me a blood pressure diary no major complaint  DYSLIPIDEMIA:    LDL is 125.  He had some trouble in the distant past with statins.  I looked back at his CT and he had some minimal coronary calcium from the past.  At this point I am not inclined to suggest management with a statin.  His HDL was good at 56.  I would be happy however to review his upcoming labs.    AORTIC ROOT DILATATION.    This was 40 mm on CT in March 2024.  I will follow this again in 1 year.  PULMONARY NODULES:  These were stable on CT in March 2024.  He will have follow up in 12 months.  I will follow this again in 1 year.  LEG PAIN: He had normal ABIs in 2021.  He is not really describing pain.  This is more weakness.  No further testing.  Signed, Rollene Rotunda, MD

## 2022-07-18 ENCOUNTER — Other Ambulatory Visit (HOSPITAL_BASED_OUTPATIENT_CLINIC_OR_DEPARTMENT_OTHER): Payer: Self-pay

## 2022-07-18 ENCOUNTER — Ambulatory Visit: Payer: Medicare Other | Attending: Cardiology | Admitting: Cardiology

## 2022-07-18 ENCOUNTER — Encounter: Payer: Self-pay | Admitting: Cardiology

## 2022-07-18 VITALS — BP 150/76 | HR 67 | Ht 74.0 in | Wt 156.6 lb

## 2022-07-18 DIAGNOSIS — R911 Solitary pulmonary nodule: Secondary | ICD-10-CM | POA: Insufficient documentation

## 2022-07-18 DIAGNOSIS — I10 Essential (primary) hypertension: Secondary | ICD-10-CM | POA: Insufficient documentation

## 2022-07-18 DIAGNOSIS — I77810 Thoracic aortic ectasia: Secondary | ICD-10-CM | POA: Diagnosis not present

## 2022-07-18 DIAGNOSIS — I442 Atrioventricular block, complete: Secondary | ICD-10-CM | POA: Insufficient documentation

## 2022-07-18 DIAGNOSIS — Z950 Presence of cardiac pacemaker: Secondary | ICD-10-CM | POA: Insufficient documentation

## 2022-07-18 NOTE — Patient Instructions (Addendum)
Medication Instructions:  Your physician recommends that you continue on your current medications as directed. Please refer to the Current Medication list given to you today.  *If you need a refill on your cardiac medications before your next appointment, please call your pharmacy*   Testing/Procedures: Non-Cardiac CT Angiography (CTA) chest, is a special type of CT scan that uses a computer to produce multi-dimensional views of major blood vessels throughout the body. In CT angiography, a contrast material is injected through an IV to help visualize the blood vessels. To be done in March 2025.    Follow-Up: At [facility], you and your health needs are our priority.  As part of our continuing mission to provide you with exceptional heart care, we have created designated Provider Care Teams.  These Care Teams include your primary Cardiologist (physician) and Advanced Practice Providers (APPs -  Physician Assistants and Nurse Practitioners) who all work together to provide you with the care you need, when you need it.  We recommend signing up for the patient portal called "MyChart".  Sign up information is provided on this After Visit Summary.  MyChart is used to connect with patients for Virtual Visits (Telemedicine).  Patients are able to view lab/test results, encounter notes, upcoming appointments, etc.  Non-urgent messages can be sent to your provider as well.   To learn more about what you can do with MyChart, go to [email or web address].    Your next appointment:   12 month(s)  Provider:   Rollene Rotunda, MD

## 2022-07-19 ENCOUNTER — Ambulatory Visit: Payer: Medicare Other | Attending: Rheumatology | Admitting: Rheumatology

## 2022-07-19 ENCOUNTER — Encounter: Payer: Self-pay | Admitting: Rheumatology

## 2022-07-19 VITALS — BP 114/69 | HR 82 | Resp 16 | Ht 74.0 in | Wt 157.2 lb

## 2022-07-19 DIAGNOSIS — M19042 Primary osteoarthritis, left hand: Secondary | ICD-10-CM | POA: Diagnosis present

## 2022-07-19 DIAGNOSIS — I442 Atrioventricular block, complete: Secondary | ICD-10-CM

## 2022-07-19 DIAGNOSIS — M353 Polymyalgia rheumatica: Secondary | ICD-10-CM | POA: Insufficient documentation

## 2022-07-19 DIAGNOSIS — M0609 Rheumatoid arthritis without rheumatoid factor, multiple sites: Secondary | ICD-10-CM | POA: Insufficient documentation

## 2022-07-19 DIAGNOSIS — R5383 Other fatigue: Secondary | ICD-10-CM | POA: Insufficient documentation

## 2022-07-19 DIAGNOSIS — Z8639 Personal history of other endocrine, nutritional and metabolic disease: Secondary | ICD-10-CM

## 2022-07-19 DIAGNOSIS — M8589 Other specified disorders of bone density and structure, multiple sites: Secondary | ICD-10-CM | POA: Insufficient documentation

## 2022-07-19 DIAGNOSIS — R2681 Unsteadiness on feet: Secondary | ICD-10-CM | POA: Insufficient documentation

## 2022-07-19 DIAGNOSIS — Z79899 Other long term (current) drug therapy: Secondary | ICD-10-CM | POA: Diagnosis present

## 2022-07-19 DIAGNOSIS — Z8669 Personal history of other diseases of the nervous system and sense organs: Secondary | ICD-10-CM | POA: Diagnosis present

## 2022-07-19 DIAGNOSIS — M5134 Other intervertebral disc degeneration, thoracic region: Secondary | ICD-10-CM | POA: Diagnosis present

## 2022-07-19 DIAGNOSIS — M19041 Primary osteoarthritis, right hand: Secondary | ICD-10-CM | POA: Diagnosis present

## 2022-07-19 DIAGNOSIS — Z8546 Personal history of malignant neoplasm of prostate: Secondary | ICD-10-CM | POA: Insufficient documentation

## 2022-07-19 DIAGNOSIS — M5136 Other intervertebral disc degeneration, lumbar region: Secondary | ICD-10-CM | POA: Diagnosis present

## 2022-07-19 DIAGNOSIS — M5030 Other cervical disc degeneration, unspecified cervical region: Secondary | ICD-10-CM

## 2022-07-19 MED ORDER — DICLOFENAC SODIUM 1 % EX GEL: CUTANEOUS | 2 refills | Status: DC

## 2022-07-19 MED ORDER — HYDROXYCHLOROQUINE SULFATE 200 MG PO TABS: 200.0000 mg | ORAL_TABLET | Freq: Every day | ORAL | 0 refills | Status: DC

## 2022-07-19 NOTE — Patient Instructions (Signed)
Standing Labs We placed an order today for your standing lab work.   Please have your standing labs drawn in May and every 3 months   Please have your labs drawn 2 weeks prior to your appointment so that the provider can discuss your lab results at your appointment, if possible.  Please note that you may see your imaging and lab results in MyChart before we have reviewed them. We will contact you once all results are reviewed. Please allow our office up to 72 hours to thoroughly review all of the results before contacting the office for clarification of your results.  WALK-IN LAB HOURS  Monday through Thursday from 8:00 am -12:30 pm and 1:00 pm-5:00 pm and Friday from 8:00 am-12:00 pm.  Patients with office visits requiring labs will be seen before walk-in labs.  You may encounter longer than normal wait times. Please allow additional time. Wait times may be shorter on  Monday and Thursday afternoons.  We do not book appointments for walk-in labs. We appreciate your patience and understanding with our staff.   Labs are drawn by [facility]. Please bring your co-pay at the time of your lab draw.  You may receive a bill from [facility] for your lab work.  Please note if you are on Hydroxychloroquine and and an order has been placed for a Hydroxychloroquine level,  you will need to have it drawn 4 hours or more after your last dose.  If you wish to have your labs drawn at another location, please call the office 24 hours in advance so we can fax the orders.  The office is located at [address], [address], [place], [place] [address]   If you have any questions regarding directions or hours of operation,  please call [phone].   As a reminder, please drink plenty of water prior to coming for your lab work. Thanks!  Vaccines You are taking a medication(s) that can suppress your immune system.  The following immunizations are recommended: Flu annually Covid-19  Td/Tdap (tetanus, diphtheria,  pertussis) every 10 years Pneumonia (Prevnar 15 then Pneumovax 23 at least 1 year apart.  Alternatively, can take Prevnar 20 without needing additional dose) Shingrix: 2 doses from 4 weeks to 6 months apart  Please check with your PCP to make sure you are up to date.  If you have signs or symptoms of an infection or start antibiotics: First, call your PCP for workup of your infection. Hold your medication through the infection, until you complete your antibiotics, and until symptoms resolve if you take the following: Injectable medication (Actemra, Benlysta, Cimzia, Cosentyx, Enbrel, Humira, Kevzara, Orencia, Remicade, Simponi, Stelara, Taltz, Tremfya) Methotrexate Leflunomide (Arava) Mycophenolate (Cellcept) Xeljanz, Olumiant, or Rinvoq   

## 2022-07-20 NOTE — Progress Notes (Signed)
Remote pacemaker transmission.   

## 2022-07-25 ENCOUNTER — Other Ambulatory Visit (HOSPITAL_BASED_OUTPATIENT_CLINIC_OR_DEPARTMENT_OTHER): Payer: Self-pay

## 2022-07-30 ENCOUNTER — Other Ambulatory Visit: Payer: Self-pay | Admitting: Rheumatology

## 2022-07-30 ENCOUNTER — Other Ambulatory Visit (HOSPITAL_BASED_OUTPATIENT_CLINIC_OR_DEPARTMENT_OTHER): Payer: Self-pay

## 2022-07-30 DIAGNOSIS — M0609 Rheumatoid arthritis without rheumatoid factor, multiple sites: Secondary | ICD-10-CM

## 2022-07-30 DIAGNOSIS — Z79899 Other long term (current) drug therapy: Secondary | ICD-10-CM

## 2022-08-01 ENCOUNTER — Other Ambulatory Visit (HOSPITAL_BASED_OUTPATIENT_CLINIC_OR_DEPARTMENT_OTHER): Payer: Self-pay

## 2022-08-01 MED ORDER — HYDROXYCHLOROQUINE SULFATE 200 MG PO TABS
200.0000 mg | ORAL_TABLET | Freq: Every day | ORAL | 0 refills | Status: DC
  Filled 2022-08-01: qty 90, 90d supply, fill #0

## 2022-08-01 MED ORDER — METHOTREXATE SODIUM 2.5 MG PO TABS
10.0000 mg | ORAL_TABLET | ORAL | 0 refills | Status: DC
  Filled 2022-08-01: qty 48, 84d supply, fill #0

## 2022-08-01 NOTE — Telephone Encounter (Signed)
Last Fill: 07/04/2022 (PLQ)  Eye exam: 05/09/2022 WNL   Labs: 05/19/2022 CBC and CMP are stable.   Next Visit: 01/04/2023  Last Visit: 07/19/2022  [id number] arthritis of multiple sites without rheumatoid factor   Current Dose per office note 07/19/2022: methotrexate 4 tablets p.o. q. weekly hydroxychloroquine 200 mg p.o. daily.   Okay to refill Plaquenil and Methotrexate?

## 2022-08-18 ENCOUNTER — Encounter (INDEPENDENT_AMBULATORY_CARE_PROVIDER_SITE_OTHER): Payer: Medicare Other | Admitting: Ophthalmology

## 2022-08-22 ENCOUNTER — Encounter (INDEPENDENT_AMBULATORY_CARE_PROVIDER_SITE_OTHER): Payer: Medicare Other | Admitting: Ophthalmology

## 2022-08-23 ENCOUNTER — Ambulatory Visit: Payer: Medicare Other | Attending: Internal Medicine | Admitting: Internal Medicine

## 2022-08-23 ENCOUNTER — Encounter: Payer: Self-pay | Admitting: Internal Medicine

## 2022-08-23 VITALS — BP 118/58 | HR 92 | Ht 74.0 in | Wt 156.2 lb

## 2022-08-23 DIAGNOSIS — I442 Atrioventricular block, complete: Secondary | ICD-10-CM

## 2022-08-23 DIAGNOSIS — I10 Essential (primary) hypertension: Secondary | ICD-10-CM | POA: Diagnosis present

## 2022-08-23 DIAGNOSIS — Z950 Presence of cardiac pacemaker: Secondary | ICD-10-CM | POA: Diagnosis present

## 2022-08-23 LAB — CUP PACEART INCLINIC DEVICE CHECK
Battery Remaining Longevity: 69 mo
Battery Voltage: 2.97 V
Brady Statistic AP VP Percent: 9.83 %
Brady Statistic AP VS Percent: 0 %
Brady Statistic AS VP Percent: 87.57 %
Brady Statistic AS VS Percent: 2.6 %
Brady Statistic RA Percent Paced: 10.64 %
Brady Statistic RV Percent Paced: 97.4 %
Date Time Interrogation Session: 20240514160119
Implantable Lead Connection Status: 753985
Implantable Lead Connection Status: 753985
Implantable Lead Implant Date: 20180827
Implantable Lead Implant Date: 20180827
Implantable Lead Location: 753859
Implantable Lead Location: 753860
Implantable Lead Model: 4076
Implantable Lead Model: 4076
Implantable Pulse Generator Implant Date: 20180827
Lead Channel Impedance Value: 342 Ohm
Lead Channel Impedance Value: 380 Ohm
Lead Channel Impedance Value: 399 Ohm
Lead Channel Impedance Value: 456 Ohm
Lead Channel Pacing Threshold Amplitude: 0.625 V
Lead Channel Pacing Threshold Amplitude: 0.875 V
Lead Channel Pacing Threshold Pulse Width: 0.4 ms
Lead Channel Pacing Threshold Pulse Width: 0.4 ms
Lead Channel Sensing Intrinsic Amplitude: 14.375 mV
Lead Channel Sensing Intrinsic Amplitude: 14.375 mV
Lead Channel Sensing Intrinsic Amplitude: 4.75 mV
Lead Channel Sensing Intrinsic Amplitude: 5.25 mV
Lead Channel Setting Pacing Amplitude: 1.5 V
Lead Channel Setting Pacing Amplitude: 2.5 V
Lead Channel Setting Pacing Pulse Width: 0.4 ms
Lead Channel Setting Sensing Sensitivity: 4 mV
Zone Setting Status: 755011

## 2022-08-23 NOTE — Patient Instructions (Signed)
Medication Instructions:  Your physician recommends that you continue on your current medications as directed. Please refer to the Current Medication list given to you today.  *If you need a refill on your cardiac medications before your next appointment, please call your pharmacy*  Lab Work: None ordered.  If you have labs (blood work) drawn today and your tests are completely normal, you will receive your results only by: MyChart Message (if you have MyChart) OR A paper copy in the mail If you have any lab test that is abnormal or we need to change your treatment, we will call you to review the results.  Testing/Procedures: None ordered.  Follow-Up: At [facility], you and your health needs are our priority.  As part of our continuing mission to provide you with exceptional heart care, we have created designated Provider Care Teams.  These Care Teams include your primary Cardiologist (physician) and Advanced Practice Providers (APPs -  Physician Assistants and Nurse Practitioners) who all work together to provide you with the care you need, when you need it.  Your next appointment:   1 year(s)  The format for your next appointment:   In Person  Provider:   Lewayne Bunting, MD{or one of the following Advanced Practice Providers on your designated Care Team:   Francis Dowse, [place] Casimiro Needle "Mardelle Matte" Lanna Poche, [place]  Remote monitoring is used to monitor your Pacemaker from home. This monitoring reduces the number of office visits required to check your device to one time per year. It allows [place] to keep an eye on the functioning of your device to ensure it is working properly. You are scheduled for a device check from home on 6/4. You may send your transmission at any time that day. If you have a wireless device, the transmission will be sent automatically. After your physician reviews your transmission, you will receive a postcard with your next transmission date

## 2022-08-23 NOTE — Progress Notes (Signed)
HPI [name] is returns for ongoing evaluation. He is a former patient of Dr. Fawn [name] who has a h/o CHB, who underwent insertion of a DDD PM in 2018. The patient has a moderately enlarged aortic root. The patient has done well in the interim. He denies chest pain or sob. No edema. No syncope.  Allergies  Allergen Reactions   Sulfasalazine Anaphylaxis   Demerol [Meperidine] Nausea Only   Garlic    Other     SHALLOTS   Sulfa Antibiotics      Current Outpatient Medications  Medication Sig Dispense Refill   acetaminophen (TYLENOL) 500 MG tablet Take 500 mg by mouth every 6 (six) hours as needed.     cholecalciferol (VITAMIN D3) 25 MCG (1000 UNIT) tablet Take 1,000 Units by mouth daily.     diclofenac Sodium (VOLTAREN) 1 % GEL Apply 2-4 grams to affected joint 4 times daily as needed. 400 g 2   folic acid (FOLVITE) 1 MG tablet Take 1 tablet (1 mg total) by mouth daily. 90 tablet 3   glucose blood test strip Use to check blood sugar 2 (two) times daily. 200 each 3   hydroxychloroquine (PLAQUENIL) 200 MG tablet Take 1 tablet (200 mg total) by mouth daily. 90 tablet 0   Lancets (ONETOUCH DELICA PLUS LANCET33G) MISC Check blood sugar two times a day. 200 each 3   metFORMIN (GLUCOPHAGE) 500 MG tablet Take 500 mg daily by mouth.      methotrexate (RHEUMATREX) 2.5 MG tablet Take 4 tablets (10 mg total) by mouth once a week. 48 tablet 0   Methotrexate Sodium (METHOTREXATE, PF,) 50 MG/2ML injection Administer 0.6 mL under the skin 1 time weekly. 8 mL 2   omega-3 acid ethyl esters (LOVAZA) 1 g capsule Take 1 capsule (1 g total) by mouth 3 (three) times a week. 36 capsule 0   ONE TOUCH ULTRA TEST test strip TEST BLOOD SUGAR BID AS DIRECTED  2   TUBERCULIN SYR 1CC/27GX1/2" (B-D TB SYRINGE 1CC/27GX1/2") 27G X 1/2" 1 ML MISC USE TO INJECT METHOTREXATE 12 each 3   vitamin E 100 UNIT capsule Take 100 Units by mouth daily.     zolpidem (AMBIEN) 5 MG tablet Take 5 mg by mouth as needed.     No current  facility-administered medications for this visit.     Past Medical History:  Diagnosis Date   Arthritis    Autoimmune disorder (HCC)    BCC (basal cell carcinoma)    BPH (benign prostatic hypertrophy) 2008   Cancer [facility]) 2008   Prostate   Cervical stenosis of spine    per patient    Complete heart block (HCC)    Complex sleep apnea syndrome    does not use CPAP   Diabetes mellitus without complication (HCC)    Diverticulosis    ED (erectile dysfunction)    Gastritis    GERD (gastroesophageal reflux disease)    Hemorrhoids    Hernia, inguinal    Hyperlipidemia    IFG (impaired fasting glucose)    Osteopenia    Peptic ulcer disease with hemorrhage 1993   Shingles    Sleep apnea    Vitamin D deficiency     ROS:   All systems reviewed and negative except as noted in the HPI.   Past Surgical History:  Procedure Laterality Date   BACK SURGERY N/A 1984   barium swallow study  2023   x2   CATARACT EXTRACTION, BILATERAL  HERNIA REPAIR     LAMINECTOMY  1984   L 5   PACEMAKER IMPLANT  12/05/2016   MDT Azure XT DR MRI conditional PPM implanted for CHB by Dr Barnie Mort in [place]   PACEMAKER INSERTION  12/05/2016   PROSTATE SURGERY  12/2006   Robotic   TONSILLECTOMY  88 years old   TRIGGER FINGER RELEASE  03/01/2012   Procedure: MINOR RELEASE TRIGGER FINGER/A-1 PULLEY;  Surgeon: [name], MD;  Location: [facility];  Service: Orthopedics;  Laterality: Left;  long finger   TRIGGER FINGER RELEASE Left 02/08/2018   Procedure: RELEASE TRIGGER FINGER/A-1 PULLEY LEFT RING FINGER;  Surgeon: Cindee Salt, MD;  Location:  SURGERY CENTER;  Service: Orthopedics;  Laterality: Left;   UPPER GI ENDOSCOPY  2023     Family History  Problem Relation Age of Onset   Heart failure Mother        Died age 46.  Apparently had valve surgery.    Congenital heart disease Brother    Stroke Maternal Grandfather    Colon cancer Neg Hx    Stomach cancer Neg Hx     Esophageal cancer Neg Hx    Rectal cancer Neg Hx      Social History   Socioeconomic History   Marital status: Married    Spouse name: Not on file   Number of children: 2   Years of education: Not on file   Highest education level: Not on file  Occupational History   Not on file  Tobacco Use   Smoking status: Never    Passive exposure: Never   Smokeless tobacco: Never  Vaping Use   Vaping Use: Never used  Substance and Sexual Activity   Alcohol use: Not Currently   Drug use: No   Sexual activity: Not on file  Other Topics Concern   Not on file  Social History Narrative   Right handed, Retired .Married, 2 kids. Caffeine none.   3 Step    Social Determinants of Health   Financial Resource Strain: Not on file  Food Insecurity: Not on file  Transportation Needs: Not on file  Physical Activity: Not on file  Stress: Not on file  Social Connections: Not on file  Intimate Partner Violence: Not on file     BP (!) 118/58   Pulse 92   Ht 6\' 2"  (1.88 m)   Wt 156 lb 3.2 oz (70.9 kg)   SpO2 98%   BMI 20.05 kg/m   Physical Exam:  Well appearing NAD HEENT: Unremarkable Neck:  No JVD, no thyromegally Lymphatics:  No adenopathy Back:  No CVA tenderness Lungs:  Clear with no wheezes HEART:  Regular rate rhythm, no murmurs, no rubs, no clicks Abd:  soft, positive bowel sounds, no organomegally, no rebound, no guarding Ext:  2 plus pulses, no edema, no cyanosis, no clubbing Skin:  No rashes no nodules Neuro:  CN II through XII intact, motor grossly intact  EKG - nsr with ventricular pacing  DEVICE  Normal device function.  See PaceArt for details.   Assess/Plan: CHB - he is asymptomatic s/p PPM insertion. PPM - his [employer] DDD PM is working normally. HTN - his bp is well controlled. We will follow.  [name],MD

## 2022-08-26 ENCOUNTER — Other Ambulatory Visit (HOSPITAL_BASED_OUTPATIENT_CLINIC_OR_DEPARTMENT_OTHER): Payer: Self-pay

## 2022-09-06 LAB — HEMOGLOBIN A1C: A1c: 6.6

## 2022-09-06 LAB — COMPREHENSIVE METABOLIC PANEL: EGFR: 91.9

## 2022-09-09 ENCOUNTER — Other Ambulatory Visit (HOSPITAL_BASED_OUTPATIENT_CLINIC_OR_DEPARTMENT_OTHER): Payer: Self-pay

## 2022-09-13 ENCOUNTER — Ambulatory Visit (INDEPENDENT_AMBULATORY_CARE_PROVIDER_SITE_OTHER): Payer: Medicare Other

## 2022-09-13 DIAGNOSIS — I442 Atrioventricular block, complete: Secondary | ICD-10-CM

## 2022-09-13 LAB — CUP PACEART REMOTE DEVICE CHECK
Battery Remaining Longevity: 68 mo
Battery Voltage: 2.97 V
Brady Statistic AP VP Percent: 11.73 %
Brady Statistic AP VS Percent: 0 %
Brady Statistic AS VP Percent: 83.52 %
Brady Statistic AS VS Percent: 4.75 %
Brady Statistic RA Percent Paced: 13.1 %
Brady Statistic RV Percent Paced: 95.25 %
Date Time Interrogation Session: 20240603225815
Implantable Lead Connection Status: 753985
Implantable Lead Connection Status: 753985
Implantable Lead Implant Date: 20180827
Implantable Lead Implant Date: 20180827
Implantable Lead Location: 753859
Implantable Lead Location: 753860
Implantable Lead Model: 4076
Implantable Lead Model: 4076
Implantable Pulse Generator Implant Date: 20180827
Lead Channel Impedance Value: 323 Ohm
Lead Channel Impedance Value: 361 Ohm
Lead Channel Impedance Value: 380 Ohm
Lead Channel Impedance Value: 437 Ohm
Lead Channel Pacing Threshold Amplitude: 0.625 V
Lead Channel Pacing Threshold Amplitude: 0.75 V
Lead Channel Pacing Threshold Pulse Width: 0.4 ms
Lead Channel Pacing Threshold Pulse Width: 0.4 ms
Lead Channel Sensing Intrinsic Amplitude: 14.375 mV
Lead Channel Sensing Intrinsic Amplitude: 14.375 mV
Lead Channel Sensing Intrinsic Amplitude: 4.875 mV
Lead Channel Sensing Intrinsic Amplitude: 4.875 mV
Lead Channel Setting Pacing Amplitude: 1.5 V
Lead Channel Setting Pacing Amplitude: 2.5 V
Lead Channel Setting Pacing Pulse Width: 0.4 ms
Lead Channel Setting Sensing Sensitivity: 4 mV
Zone Setting Status: 755011

## 2022-09-29 ENCOUNTER — Encounter (HOSPITAL_BASED_OUTPATIENT_CLINIC_OR_DEPARTMENT_OTHER): Payer: Self-pay | Admitting: Pharmacist

## 2022-09-29 ENCOUNTER — Other Ambulatory Visit: Payer: Self-pay | Admitting: Physician Assistant

## 2022-09-29 ENCOUNTER — Other Ambulatory Visit (HOSPITAL_BASED_OUTPATIENT_CLINIC_OR_DEPARTMENT_OTHER): Payer: Self-pay

## 2022-09-29 DIAGNOSIS — Z79899 Other long term (current) drug therapy: Secondary | ICD-10-CM

## 2022-09-29 DIAGNOSIS — M0609 Rheumatoid arthritis without rheumatoid factor, multiple sites: Secondary | ICD-10-CM

## 2022-09-30 ENCOUNTER — Other Ambulatory Visit: Payer: Self-pay | Admitting: *Deleted

## 2022-09-30 ENCOUNTER — Other Ambulatory Visit (HOSPITAL_BASED_OUTPATIENT_CLINIC_OR_DEPARTMENT_OTHER): Payer: Self-pay

## 2022-09-30 DIAGNOSIS — M0609 Rheumatoid arthritis without rheumatoid factor, multiple sites: Secondary | ICD-10-CM

## 2022-09-30 DIAGNOSIS — Z79899 Other long term (current) drug therapy: Secondary | ICD-10-CM

## 2022-09-30 MED ORDER — PREDNISONE 5 MG PO TABS
ORAL_TABLET | ORAL | 0 refills | Status: AC
  Filled 2022-09-30: qty 30, 12d supply, fill #0

## 2022-09-30 NOTE — Telephone Encounter (Signed)
Patient contacted the office about a flare he has been experiencing. Patient is concerned that this may be due to his recent decrease in MTX and PLQ. Patient would like to discuss possibly increasing these medications. Please advise.

## 2022-09-30 NOTE — Telephone Encounter (Signed)
Patient contacted the office stating that he has had his first significant flare in 5 years. Patient states he is haivng swelling, heat and redness in his left wrist. Patient is requesting a prescription for Prednisone to be sent to the [facility] at [facility].

## 2022-09-30 NOTE — Telephone Encounter (Signed)
I spoke with him about the plan, sending prednisone taper Rx to [facility] starting at 20 mg daily. He can follow up about this with [name] next week as he recently decreased maintenance medication doses. Also reports using his wrist and thumb more than average on tasks around the apartment maybe contributory.

## 2022-09-30 NOTE — Addendum Note (Signed)
Addended by: Fuller Plan on: 09/30/2022 12:21 PM   Modules accepted: Orders

## 2022-10-03 NOTE — Telephone Encounter (Signed)
I called Jeffery [name].  He states that he developed swelling in his wrist.  He has noticed improvement on prednisone.  We decided to increase the dose of methotrexate to 6 tablets p.o. weekly along with folic acid.  Plaquenil 200 mg p.o. twice daily.  Please send a prescription for methotrexate and Plaquenil.  Patient states he had labs at Dr. Ferd Hibbs office in May.  Please request labs from Dr. Ferd Hibbs office.

## 2022-10-04 ENCOUNTER — Telehealth: Payer: Self-pay | Admitting: *Deleted

## 2022-10-04 ENCOUNTER — Other Ambulatory Visit (HOSPITAL_BASED_OUTPATIENT_CLINIC_OR_DEPARTMENT_OTHER): Payer: Self-pay

## 2022-10-04 MED ORDER — METHOTREXATE SODIUM 2.5 MG PO TABS
15.0000 mg | ORAL_TABLET | ORAL | 0 refills | Status: DC
  Filled 2022-10-04: qty 72, 84d supply, fill #0

## 2022-10-04 MED ORDER — HYDROXYCHLOROQUINE SULFATE 200 MG PO TABS
200.0000 mg | ORAL_TABLET | Freq: Two times a day (BID) | ORAL | 0 refills | Status: DC
  Filled 2022-10-04: qty 180, 90d supply, fill #0

## 2022-10-04 NOTE — Telephone Encounter (Signed)
Left message at Dr. Ferd Hibbs office to advise we need of patient's most recent lab results.

## 2022-10-04 NOTE — Telephone Encounter (Signed)
Labs received from:[facility]  Drawn on: 09/06/2022  Reviewed by: Sherron Ales, PA-C  Labs drawn:CMP, CBC, Lipid, TSH, Hgb A1C  Results:Glucose 127   MVC 101.2   MCH 32.7   RDW 12.1  Patient is on PLQ 200 mg po BID and MTX 6 tabs per week.  Per Ladona Ridgel, please ensure patient is taking Folic Acid as prescribed.   Patient advised to make sure to take Folic Acid daily.

## 2022-10-04 NOTE — Addendum Note (Signed)
Addended by: Henriette Combs on: 10/04/2022 08:08 AM   Modules accepted: Orders

## 2022-10-06 NOTE — Progress Notes (Signed)
Remote pacemaker transmission.   

## 2022-10-07 ENCOUNTER — Other Ambulatory Visit (HOSPITAL_BASED_OUTPATIENT_CLINIC_OR_DEPARTMENT_OTHER): Payer: Self-pay

## 2022-10-11 ENCOUNTER — Other Ambulatory Visit (HOSPITAL_BASED_OUTPATIENT_CLINIC_OR_DEPARTMENT_OTHER): Payer: Self-pay

## 2022-10-21 ENCOUNTER — Other Ambulatory Visit (HOSPITAL_BASED_OUTPATIENT_CLINIC_OR_DEPARTMENT_OTHER): Payer: Self-pay

## 2022-10-28 ENCOUNTER — Telehealth: Payer: Self-pay | Admitting: Internal Medicine

## 2022-10-28 ENCOUNTER — Ambulatory Visit: Payer: Medicare Other | Attending: Cardiology

## 2022-10-28 NOTE — Telephone Encounter (Signed)
Patient c/o Palpitations:  High priority if patient c/o lightheadedness, shortness of breath, or chest pain  How long have you had palpitations/irregular HR/ Afib? Are you having the symptoms now?  Irregular/extra heart beat. Patient states he is unsure of how long this has been occurring. Patient states he was told his HR was irregular when in the ED for something else.  Are you currently experiencing lightheadedness, SOB or CP?  No   Do you have a history of afib (atrial fibrillation) or irregular heart rhythm?  No   Have you checked your BP or HR? (document readings if available):  Last night in the ED, HR was in the high 80's  Are you experiencing any other symptoms?  Extreme fatigue

## 2022-10-28 NOTE — Telephone Encounter (Signed)
Pt returning call that was cut off. Please advise.

## 2022-10-28 NOTE — Progress Notes (Signed)
Checked device in clinic w/ Del from [employer] per MD GT request. Pt c/o "palpitations and severe fatigue." Normal device function. Nominally insignificant increase in PVC's per hour. Reviewed w/ Mealor MD and agreed that symptoms could not be related to implanted device. No notable episodes since January 2024. Will upload associated documentation into paceart when back operational. Per Mealor MD advised pt to follow up with PCP.

## 2022-10-28 NOTE — Telephone Encounter (Signed)
Patient is returning call. Requesting return call.  

## 2022-10-28 NOTE — Telephone Encounter (Signed)
I called the patient and [name] spoke with him.

## 2022-10-28 NOTE — Telephone Encounter (Signed)
Returned call to patient who is currently in route back to [place] to see PCP. Reviewed hospital notes from [place]: Jeffery [name]. is a 88 y.o. male who presents to the ED. Patient says he has had "extreme fatigue" for the past few days. He says he checked his heart rate today and it was 97. He says that is high for him. He also checked his pulse and says he could feel it skipping beats. He denies pain. He denies shortness of breath. No recent vomiting or diarrhea. No urinary symptoms. No recent fever. He denies feeling dizzy or lightheaded. He says he had a pacer implanted 7 years ago.  Cardiovascular:  Regular rate. Regular rhythm. Occasional PVC's noted.  ECG 12 Lead  EKG Ventricular Rate 94 BPM  EKG Atrial Rate 94 BPM  EKG P-R Interval 194 ms  EKG QRS Duration 182 ms  EKG Q-T Interval 434 ms  EKG QTC Calculation 542 ms  EKG Calculated P Axis 75 degrees  EKG Calculated R Axis -70 degrees  EKG Calculated T Axis 89 degrees  QTC Fredericia 504 ms  ECG 12 Lead Result Date: 10/27/2022 Atrial-sensed ventricular-paced rhythm with frequent premature ventricular complexes Abnormal ECG No previous ECGs available  ICD / Pacemaker Evaluation (Results Pending)  [place] but was able to determine he's feeling well and reassure him that all checked out in ED. His only complaint is "extreme fatigue." He states hospital did get results from [employer] regarding pacemaker, although chart doesn't show that. Routing to device clinic to see what they can see.

## 2022-10-28 NOTE — Telephone Encounter (Signed)
Patient coming to device clinic today as he is unable to send a manual transmission at 2pm for device interrogation and to review if any correlation with sx's.

## 2022-10-30 ENCOUNTER — Encounter: Payer: Self-pay | Admitting: Rheumatology

## 2022-10-30 ENCOUNTER — Encounter: Payer: Self-pay | Admitting: Cardiology

## 2022-10-31 ENCOUNTER — Telehealth: Payer: Self-pay | Admitting: Cardiology

## 2022-10-31 ENCOUNTER — Other Ambulatory Visit: Payer: Self-pay | Admitting: *Deleted

## 2022-10-31 ENCOUNTER — Other Ambulatory Visit: Payer: Self-pay | Admitting: Adult Health

## 2022-10-31 ENCOUNTER — Telehealth: Payer: Self-pay | Admitting: *Deleted

## 2022-10-31 ENCOUNTER — Other Ambulatory Visit: Payer: Medicare Other

## 2022-10-31 DIAGNOSIS — Z79899 Other long term (current) drug therapy: Secondary | ICD-10-CM

## 2022-10-31 DIAGNOSIS — M353 Polymyalgia rheumatica: Secondary | ICD-10-CM

## 2022-10-31 DIAGNOSIS — M0609 Rheumatoid arthritis without rheumatoid factor, multiple sites: Secondary | ICD-10-CM

## 2022-10-31 DIAGNOSIS — J90 Pleural effusion, not elsewhere classified: Secondary | ICD-10-CM

## 2022-10-31 DIAGNOSIS — R9389 Abnormal findings on diagnostic imaging of other specified body structures: Secondary | ICD-10-CM

## 2022-10-31 NOTE — Telephone Encounter (Signed)
Spoke with patient's daughter he is scheduled on Wednesday with Dr. Wyline Mood. Please see previous encounter

## 2022-10-31 NOTE — Telephone Encounter (Signed)
Kirt Boys called the office and left a message stating she is really concerned about her father. She states Jeffery [name] is extremely fatigued. "Alarmingly so". Patient states she is not so sure he doesn't need to be hospitalized.   Returned call to [facility] and advised per [name] she should take Jeffery [name] to the ER for an evaluation. She states they were already there on Friday and it does not appear to be anything acute. She states Jeffery [name] has an appointment on Wednesday and they will just wait until that appointment. She states if anything changes then she will take him to the ER. She states Jeffery [name] is having increased weakness. Again tried to encourage Kirt Boys to take Jeffery [name] to the ER. She declined and states she will wait until Wednesday.

## 2022-10-31 NOTE — Progress Notes (Unsigned)
Office Visit Note  Patient: Jeffery [name]             Date of Birth: Mar 11, 1935           MRN: [id number]             PCP: Creola Corn, MD Referring: Creola Corn, MD Visit Date: 11/02/2022 Occupation: @GUAROCC @  Subjective:  No chief complaint on file.   History of Present Illness: Jeffery [name] is a 88 y.o. male ***     Activities of Daily Living:  Patient reports morning stiffness for *** {minute/hour:19697}.   Patient {ACTIONS;DENIES/REPORTS:21021675::"Denies"} nocturnal pain.  Difficulty dressing/grooming: {ACTIONS;DENIES/REPORTS:21021675::"Denies"} Difficulty climbing stairs: {ACTIONS;DENIES/REPORTS:21021675::"Denies"} Difficulty getting out of chair: {ACTIONS;DENIES/REPORTS:21021675::"Denies"} Difficulty using hands for taps, buttons, cutlery, and/or writing: {ACTIONS;DENIES/REPORTS:21021675::"Denies"}  No Rheumatology ROS completed.   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   Cystoid macular edema of right eye 08/26/2019   Pseudophakia of both eyes 08/26/2019   Obstructive sleep apnea of adult 08/26/2019   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) 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 08/24/2019   Educated about COVID-19 virus infection 10/08/2018   Aortic root dilatation (HCC) 10/08/2018   Mitral annular calcification 10/08/2018   Neuropathy 08/21/2017   Hydroxychloroquine causing adverse effect in therapeutic use 08/21/2017   Symptomatic bradycardia 02/23/2017   Status cardiac pacemaker 02/23/2017    CSA (central sleep apnea) 02/23/2017   Heart block AV complete (HCC) 01/18/2017   Vitamin D deficiency 02/08/2016   Trigger finger, left middle finger 02/08/2016   Long-term use of Plaquenil 02/08/2016   Bursitis of right shoulder 02/08/2016   DDD (degenerative disc disease), lumbar 02/08/2016   DDD (degenerative disc disease), thoracic 02/08/2016   Rheumatoid arthritis of multiple sites without rheumatoid factor (HCC) 11/28/2015    Class: Chronic   Other secondary osteoarthritis of both hands 11/28/2015    Class: Chronic   PMR (polymyalgia rheumatica) (HCC) 11/28/2015    Class: Chronic   Osteoporosis 11/28/2015    Class: Chronic   Prostate cancer (HCC) 11/28/2015    Class: History of   Duodenal ulcer 11/28/2015    Class: Chronic   High cholesterol 11/28/2015   Dizziness 07/16/2015   Incomplete RBBB 03/31/2015   Sleep apnea, primary central 03/03/2015   Polyneuropathy (HCC) 01/24/2013   Right inguinal hernia 07/05/2012    Past Medical History:  Diagnosis Date   Arthritis    Autoimmune disorder (HCC)    BCC (basal cell carcinoma)    BPH (benign prostatic hypertrophy) 2008   Cancer [facility]) 2008   Prostate   Cervical stenosis of spine    per patient    Complete heart block (HCC)    Complex sleep apnea syndrome    does not use CPAP   Diabetes mellitus without complication (HCC)    Diverticulosis    ED (erectile dysfunction)    Gastritis    GERD (gastroesophageal reflux disease)    Hemorrhoids    Hernia, inguinal    Hyperlipidemia    IFG (impaired fasting glucose)    Osteopenia    Peptic  ulcer disease with hemorrhage 1993   Shingles    Sleep apnea    Vitamin D deficiency     Family History  Problem Relation Age of Onset   Heart failure Mother        Died age 26.  Apparently had valve surgery.    Congenital heart disease Brother    Stroke Maternal Grandfather    Colon cancer Neg Hx    Stomach cancer Neg Hx    Esophageal cancer Neg Hx    Rectal cancer Neg Hx     Past Surgical History:  Procedure Laterality Date   BACK SURGERY N/A 1984   barium swallow study  2023   x2   CATARACT EXTRACTION, BILATERAL     HERNIA REPAIR     LAMINECTOMY  1984   L 5   PACEMAKER IMPLANT  12/05/2016   MDT Azure XT DR MRI conditional PPM implanted for CHB by Dr Barnie Mort in [place]   PACEMAKER INSERTION  12/05/2016   PROSTATE SURGERY  12/2006   Robotic   TONSILLECTOMY  88 years old   TRIGGER FINGER RELEASE  03/01/2012   Procedure: MINOR RELEASE TRIGGER FINGER/A-1 PULLEY;  Surgeon: [name], MD;  Location: [facility];  Service: Orthopedics;  Laterality: Left;  long finger   TRIGGER FINGER RELEASE Left 02/08/2018   Procedure: RELEASE TRIGGER FINGER/A-1 PULLEY LEFT RING FINGER;  Surgeon: Cindee Salt, MD;  Location: [facility];  Service: Orthopedics;  Laterality: Left;   UPPER GI ENDOSCOPY  2023   Social History   Social History Narrative   Right handed, Retired .Married, 2 kids. Caffeine none.   3 Step    Immunization History  Administered Date(s) Administered   COVID-19, mRNA, vaccine(Comirnaty)12 years and older 03/17/2022   Influenza, High Dose Seasonal PF 03/03/2017, 01/26/2018   Moderna SARS-COV2 Booster Vaccination 10/15/2020   Moderna Sars-Covid-2 Vaccination 04/13/2019, 05/14/2019, 12/12/2019     Objective: Vital Signs: There were no vitals taken for this visit.   Physical Exam   Musculoskeletal Exam: ***  CDAI Exam: CDAI Score: -- Patient Global: --; Provider Global: -- Swollen: --; Tender: -- Joint Exam 11/02/2022   No joint exam has been documented for this visit   There is currently no information documented on the homunculus. Go to the Rheumatology activity and complete the homunculus joint exam.  Investigation: No additional findings.  Imaging: No results found.  Recent Labs: Lab Results  Component Value Date   WBC 4.0 05/19/2022   HGB 14.2 05/19/2022   PLT 229 05/19/2022   NA 140  05/19/2022   K 5.3 05/19/2022   CL 99 05/19/2022   CO2 33 (H) 05/19/2022   GLUCOSE 112 (H) 05/19/2022   BUN 14 05/19/2022   CREATININE 0.77 05/19/2022   BILITOT 0.6 05/19/2022   ALKPHOS 85 03/19/2019   AST 9 (L) 05/19/2022   ALT 12 05/19/2022   PROT 6.8 05/19/2022   ALBUMIN 4.7 (H) 03/19/2019   CALCIUM 9.7 05/19/2022   GFRAA 102 08/10/2020    Speciality Comments: PLQ Eye Exam: 05/09/2022 WNL @ Dr. Nile Riggs (in [facility]). Follow up in 1 year  Procedures:  No procedures performed Allergies: Sulfasalazine, Demerol [meperidine], Garlic, Other, and Sulfa antibiotics   Assessment / Plan:     Visit Diagnoses: No diagnosis found.  Orders: No orders of the defined types were placed in this encounter.  No orders of the defined types were placed in this encounter.   Face-to-face time spent with patient  was *** minutes. Greater than 50% of time was spent in counseling and coordination of care.  Follow-Up Instructions: No follow-ups on file.   Ellen Henri, CMA  Note - This record has been created using Animal nutritionist.  Chart creation errors have been sought, but may not always  have been located. Such creation errors do not reflect on  the standard of medical care.

## 2022-10-31 NOTE — Telephone Encounter (Signed)
Patient called about his MyChart message stating he didn't hear back.  I advised him they replied back to him, I read him the message.  He would like to be seen tomorrow by Dr. Antoine Poche, he does not want to be seen by one of advance practitioners.

## 2022-10-31 NOTE — Telephone Encounter (Signed)
Please give patient have appointment available on Wednesday.  It could be helpful if patient could get following labs today which should include sed rate, CK, RF, anti-CCP.  I would recommend patient to also see a pulmonologist if he has not seen one.

## 2022-10-31 NOTE — Telephone Encounter (Signed)
Patient is scheduled for Wednesday with DOD Dr. Wyline Mood. Hospital follow up. He is aware Dr. Antoine Poche is on vacation

## 2022-10-31 NOTE — Telephone Encounter (Signed)
Pt sent this Via mychart to the scheduling pool:    Jeffery [name], 1. Yes. Sometimes other party advises can barely understand. 2. Increasing difficulty last three weeks. 3. Moving around. Seem to recover after sitting. 4. Extreme fatigue. General weakness, especially legs. Thank you, Jeffery [name] Morning Jeffery [name],  Can you tell me a little more about your Shortness of Breath so I can get this over to one of our Triage nurses?     1. Are you currently SOB (can you hear that pt is SOB on the phone)?   2. How long have you been experiencing SOB?   3. Are you SOB when sitting or when up moving around?   4. Are you currently experiencing any other symptoms?      Appointment Request From: Jeffery [name]  With Provider: Rollene Rotunda [facility] [address]]  Preferred Date Range: 10/31/2022 - 10/31/2022  Preferred Times: Any Time  Reason for visit: Office Visit  Comments: Dr. Rayburn Ma weakness/fatigue. Unresolved after 7/18 ER in [facility] see Epic, 7/19 with Creola Corn MD, 7/19 CT Scan, 7/19 [facility]. All recommend contact with you. Have confidence in your exam, further procedures. No acute pain. Shortness of breath slightest exertion. Never experienced this extreme condition. Await your call at [phone].Thank you, Jeffery [name] [birth year]

## 2022-11-02 ENCOUNTER — Encounter: Payer: Self-pay | Admitting: Internal Medicine

## 2022-11-02 ENCOUNTER — Encounter: Payer: Self-pay | Admitting: Neurology

## 2022-11-02 ENCOUNTER — Ambulatory Visit: Payer: Medicare Other | Attending: Rheumatology | Admitting: Rheumatology

## 2022-11-02 ENCOUNTER — Ambulatory Visit (INDEPENDENT_AMBULATORY_CARE_PROVIDER_SITE_OTHER): Payer: Medicare Other | Admitting: Internal Medicine

## 2022-11-02 ENCOUNTER — Other Ambulatory Visit: Payer: Self-pay | Admitting: *Deleted

## 2022-11-02 ENCOUNTER — Other Ambulatory Visit (HOSPITAL_BASED_OUTPATIENT_CLINIC_OR_DEPARTMENT_OTHER): Payer: Self-pay

## 2022-11-02 ENCOUNTER — Telehealth: Payer: Self-pay | Admitting: Neurology

## 2022-11-02 ENCOUNTER — Encounter: Payer: Self-pay | Admitting: Rheumatology

## 2022-11-02 ENCOUNTER — Telehealth: Payer: Self-pay | Admitting: Rheumatology

## 2022-11-02 VITALS — BP 126/64 | HR 66 | Ht 74.0 in | Wt 153.0 lb

## 2022-11-02 VITALS — BP 115/67 | HR 86 | Resp 14 | Ht 74.0 in | Wt 154.0 lb

## 2022-11-02 DIAGNOSIS — R5383 Other fatigue: Secondary | ICD-10-CM

## 2022-11-02 DIAGNOSIS — M5134 Other intervertebral disc degeneration, thoracic region: Secondary | ICD-10-CM | POA: Diagnosis present

## 2022-11-02 DIAGNOSIS — Z79899 Other long term (current) drug therapy: Secondary | ICD-10-CM | POA: Diagnosis present

## 2022-11-02 DIAGNOSIS — G629 Polyneuropathy, unspecified: Secondary | ICD-10-CM

## 2022-11-02 DIAGNOSIS — Z8669 Personal history of other diseases of the nervous system and sense organs: Secondary | ICD-10-CM | POA: Diagnosis present

## 2022-11-02 DIAGNOSIS — M0609 Rheumatoid arthritis without rheumatoid factor, multiple sites: Secondary | ICD-10-CM | POA: Insufficient documentation

## 2022-11-02 DIAGNOSIS — M353 Polymyalgia rheumatica: Secondary | ICD-10-CM | POA: Insufficient documentation

## 2022-11-02 DIAGNOSIS — Z8546 Personal history of malignant neoplasm of prostate: Secondary | ICD-10-CM | POA: Insufficient documentation

## 2022-11-02 DIAGNOSIS — Z950 Presence of cardiac pacemaker: Secondary | ICD-10-CM | POA: Insufficient documentation

## 2022-11-02 DIAGNOSIS — M19041 Primary osteoarthritis, right hand: Secondary | ICD-10-CM | POA: Insufficient documentation

## 2022-11-02 DIAGNOSIS — R9431 Abnormal electrocardiogram [ECG] [EKG]: Secondary | ICD-10-CM

## 2022-11-02 DIAGNOSIS — M19042 Primary osteoarthritis, left hand: Secondary | ICD-10-CM | POA: Insufficient documentation

## 2022-11-02 DIAGNOSIS — I77810 Thoracic aortic ectasia: Secondary | ICD-10-CM

## 2022-11-02 DIAGNOSIS — M8589 Other specified disorders of bone density and structure, multiple sites: Secondary | ICD-10-CM | POA: Diagnosis present

## 2022-11-02 DIAGNOSIS — R0602 Shortness of breath: Secondary | ICD-10-CM | POA: Insufficient documentation

## 2022-11-02 DIAGNOSIS — R2681 Unsteadiness on feet: Secondary | ICD-10-CM

## 2022-11-02 DIAGNOSIS — M5030 Other cervical disc degeneration, unspecified cervical region: Secondary | ICD-10-CM | POA: Insufficient documentation

## 2022-11-02 DIAGNOSIS — I442 Atrioventricular block, complete: Secondary | ICD-10-CM | POA: Insufficient documentation

## 2022-11-02 DIAGNOSIS — Z4502 Encounter for adjustment and management of automatic implantable cardiac defibrillator: Secondary | ICD-10-CM

## 2022-11-02 DIAGNOSIS — Z8639 Personal history of other endocrine, nutritional and metabolic disease: Secondary | ICD-10-CM | POA: Insufficient documentation

## 2022-11-02 DIAGNOSIS — M5136 Other intervertebral disc degeneration, lumbar region: Secondary | ICD-10-CM | POA: Diagnosis present

## 2022-11-02 DIAGNOSIS — R490 Dysphonia: Secondary | ICD-10-CM

## 2022-11-02 DIAGNOSIS — E785 Hyperlipidemia, unspecified: Secondary | ICD-10-CM

## 2022-11-02 DIAGNOSIS — G7000 Myasthenia gravis without (acute) exacerbation: Secondary | ICD-10-CM

## 2022-11-02 DIAGNOSIS — R1312 Dysphagia, oropharyngeal phase: Secondary | ICD-10-CM

## 2022-11-02 LAB — RHEUMATOID FACTOR: Rheumatoid fact SerPl-aCnc: 10 IU/mL (ref <14)

## 2022-11-02 LAB — CK: Total CK: 62 U/L (ref 44–196)

## 2022-11-02 LAB — SEDIMENTATION RATE: Sed Rate: 29 mm/h — ABNORMAL HIGH (ref 0–20)

## 2022-11-02 LAB — CYCLIC CITRUL PEPTIDE ANTIBODY, IGG: Cyclic Citrullin Peptide Ab: <16 UNITS

## 2022-11-02 MED ORDER — METOPROLOL SUCCINATE ER 25 MG PO TB24
25.0000 mg | ORAL_TABLET | Freq: Every day | ORAL | 3 refills | Status: DC
  Filled 2022-11-02: qty 90, 90d supply, fill #0

## 2022-11-02 NOTE — Telephone Encounter (Signed)
Pt called along with daughter (which is on [place]) daughter stated that both pt's PCP and Rheumatologist has suggested pt see Dr Jeffery [name] for possible diagnosis of ,Myasthenia gravis.  It was explained to daughter that although pt is a current pt of Dr Jeffery [name] each diagnosis requires its own referral.  Daughter is asking the Jeffery Lyons, RN calls pt to discuss this request.  She was told the message would be sent asking for a call back, but that it would also be noted of what she and pt were told.  Daughter said she will reach out to pt's Rheumatologist for the needed referral.

## 2022-11-02 NOTE — Progress Notes (Signed)
Cardiology Office Note:    Date:  11/02/2022   ID:  Jeffery [name], Jeffery [name] [birth year], MRN [id number]  PCP:  Jeffery Corn, MD   [facility] Providers Cardiologist:  Jeffery Rotunda, MD     Referring MD: Jeffery Corn, MD   No chief complaint on file. Hospital FU  History of Present Illness:    Jeffery [name] is a 88 y.o. male with a hx of prostate CA, DM2, hx of CHB s/p DDD PPM in 2018. Sees Dr. Ladona [name]. He is here in DOD, patient of Dr. Antoine [name]. See his notes 07/18/2022. Patient went to the [facility] with extreme fatigue. He was given fluids. His w/u was benign.   Past Medical History:  Diagnosis Date   Arthritis    Autoimmune disorder (HCC)    BCC (basal cell carcinoma)    BPH (benign prostatic hypertrophy) 2008   Cancer [facility]) 2008   Prostate   Cervical stenosis of spine    per patient    Complete heart block (HCC)    Complex sleep apnea syndrome    does not use CPAP   Diabetes mellitus without complication (HCC)    Diverticulosis    ED (erectile dysfunction)    Gastritis    GERD (gastroesophageal reflux disease)    Hemorrhoids    Hernia, inguinal    Hyperlipidemia    IFG (impaired fasting glucose)    Osteopenia    Peptic ulcer disease with hemorrhage 1993   Shingles    Sleep apnea    Vitamin D deficiency     Past Surgical History:  Procedure Laterality Date   BACK SURGERY N/A 1984   barium swallow study  2023   x2   CATARACT EXTRACTION, BILATERAL     HERNIA REPAIR     LAMINECTOMY  1984   L 5   PACEMAKER IMPLANT  12/05/2016   MDT Azure XT DR MRI conditional PPM implanted for CHB by Dr Barnie Mort in [place]   PACEMAKER INSERTION  12/05/2016   PROSTATE SURGERY  12/2006   Robotic   TONSILLECTOMY  88 years old   TRIGGER FINGER RELEASE  03/01/2012   Procedure: MINOR RELEASE TRIGGER FINGER/A-1 PULLEY;  Surgeon: [name], MD;  Location: [facility];  Service: Orthopedics;  Laterality: Left;  long finger   TRIGGER FINGER  RELEASE Left 02/08/2018   Procedure: RELEASE TRIGGER FINGER/A-1 PULLEY LEFT RING FINGER;  Surgeon: Cindee Salt, MD;  Location: [facility];  Service: Orthopedics;  Laterality: Left;   UPPER GI ENDOSCOPY  2023    Current Medications: No outpatient medications have been marked as taking for the 11/02/22 encounter (Appointment) with Jeffery Fus, MD.     Allergies:   Sulfasalazine, Demerol [meperidine], Garlic, Other, and Sulfa antibiotics   Social History   Socioeconomic History   Marital status: Married    Spouse name: Not on file   Number of children: 2   Years of education: Not on file   Highest education level: Not on file  Occupational History   Not on file  Tobacco Use   Smoking status: Never    Passive exposure: Never   Smokeless tobacco: Never  Vaping Use   Vaping status: Never Used  Substance and Sexual Activity   Alcohol use: Not Currently   Drug use: No   Sexual activity: Not on file  Other Topics Concern   Not on file  Social History Narrative   Right handed, Retired .  Married, 2 kids. Caffeine none.   3 Step    Social Determinants of Health   Financial Resource Strain: Not on file  Food Insecurity: Not on file  Transportation Needs: Not on file  Physical Activity: Not on file  Stress: Not on file  Social Connections: Unknown (10/28/2022)   Received from [facility]   Social Network    Social Network: Not on file     Family History: The patient'sfamily history includes Congenital heart disease in his brother; Heart failure in his mother; Stroke in his maternal grandfather. There is no history of Colon cancer, Stomach cancer, Esophageal cancer, or Rectal cancer.  ROS:   Please see the history of present illness.     All other systems reviewed and are negative.  EKGs/Labs/Other Studies Reviewed:    The following studies were reviewed today:  EKG 08/23/2022- PVCs, A-sensed V paced;       Recent Labs: 05/19/2022: ALT 12; BUN 14; Creat  0.77; Hemoglobin 14.2; Platelets 229; Potassium 5.3; Sodium 140   Recent Lipid Panel No results found for: "CHOL", "TRIG", "HDL", "CHOLHDL", "VLDL", "LDLCALC", "LDLDIRECT"   Risk Assessment/Calculations:     Physical Exam:    VS:  Vitals:   11/02/22 1614  BP: 126/64  Pulse: 66  SpO2: 95%     Wt Readings from Last 3 Encounters:  11/02/22 154 lb (69.9 kg)  08/23/22 156 lb 3.2 oz (70.9 kg)  07/19/22 157 lb 3.2 oz (71.3 kg)     GEN:  Well nourished, well developed in no acute distress HEENT: Normal NECK: No JVD; No carotid bruits LYMPHATICS: No lymphadenopathy CARDIAC: RRR, no murmurs, rubs, gallops RESPIRATORY:  Clear to auscultation without rales, wheezing or rhonchi  ABDOMEN: Soft, non-tender, non-distended MUSCULOSKELETAL:  No edema; No deformity  SKIN: Warm and dry NEUROLOGIC:  Alert and oriented x 3 PSYCHIATRIC:  Normal affect   ASSESSMENT and PLAN  PVCs: has frequent PVCs. Start BB  CHB : s/p Medtr[employer] PPM DDD followed by EP. Has 95% RV pacing with CHB. QRS 176 ms. If EF has declined, may benefit from CRT. Will get TTE  HTN: continue medications   FU EP  FU Dr. Antoine [name]       Medication Adjustments/Labs and Tests Ordered: Current medicines are reviewed at length with the patient today.  Concerns regarding medicines are outlined above.  No orders of the defined types were placed in this encounter.  No orders of the defined types were placed in this encounter.   There are no Patient Instructions on file for this visit.   Signed, Jeffery Fus, MD  11/02/2022 4:07 PM     [facility]

## 2022-11-02 NOTE — Patient Instructions (Signed)
Standing Labs We placed an order today for your standing lab work.   Please have your standing labs drawn in October and every 3 months  Please have your labs drawn 2 weeks prior to your appointment so that the provider can discuss your lab results at your appointment, if possible.  Please note that you may see your imaging and lab results in MyChart before we have reviewed them. We will contact you once all results are reviewed. Please allow our office up to 72 hours to thoroughly review all of the results before contacting the office for clarification of your results.  WALK-IN LAB HOURS  Monday through Thursday from 8:00 am -12:30 pm and 1:00 pm-5:00 pm and Friday from 8:00 am-12:00 pm.  Patients with office visits requiring labs will be seen before walk-in labs.  You may encounter longer than normal wait times. Please allow additional time. Wait times may be shorter on  Monday and Thursday afternoons.  We do not book appointments for walk-in labs. We appreciate your patience and understanding with our staff.   Labs are drawn by [facility]. Please bring your co-pay at the time of your lab draw.  You may receive a bill from [facility] for your lab work.  Please note if you are on Hydroxychloroquine and and an order has been placed for a Hydroxychloroquine level,  you will need to have it drawn 4 hours or more after your last dose.  If you wish to have your labs drawn at another location, please call the office 24 hours in advance so we can fax the orders.  The office is located at [address], [address], [place], [place] [address]   If you have any questions regarding directions or hours of operation,  please call [phone].   As a reminder, please drink plenty of water prior to coming for your lab work. Thanks!   Vaccines You are taking a medication(s) that can suppress your immune system.  The following immunizations are recommended: Flu annually Covid-19  RSV Td/Tdap (tetanus,  diphtheria, pertussis) every 10 years Pneumonia (Prevnar 15 then Pneumovax 23 at least 1 year apart.  Alternatively, can take Prevnar 20 without needing additional dose) Shingrix: 2 doses from 4 weeks to 6 months apart If you have signs or symptoms of an infection or start antibiotics: First, call your PCP for workup of your infection. Hold your medication through the infection, until you complete your antibiotics, and until symptoms resolve if you take the following: Injectable medication (Actemra, Benlysta, Cimzia, Cosentyx, Enbrel, Humira, Kevzara, Orencia, Remicade, Simponi, Stelara, Taltz, Tremfya) Methotrexate Leflunomide (Arava) Mycophenolate (Cellcept) Osborne [place], or Rinvoq   Please check with your PCP to make sure you are up to date.

## 2022-11-02 NOTE — Telephone Encounter (Signed)
Referral placed. Patient advised referral placed.

## 2022-11-02 NOTE — Telephone Encounter (Signed)
Patient called stating Dr. Oliva Bustard office needs a referral since this is a new diagnosis (Myasthenia gravis).  Patient is requesting it be sent ASAP so that he can schedule an appointment.

## 2022-11-02 NOTE — Addendum Note (Signed)
Addended by: Judi Cong on: 11/02/2022 05:34 PM   Modules accepted: Orders

## 2022-11-02 NOTE — Telephone Encounter (Signed)
Received call from patient and patients daughter regarding referral placed by Dr Corliss Skains. It looks like the referral was just entered today at 1:21 and we have not had a chance to review it. The patients daughter stated that they needed to be placed on Dr Marko Stai schedule tomorrow for an evaluation of MG. I let her know that the referral was entered as urgent, and that we usually try to work patients in within two weeks of receiving the referral. She stated that was unacceptable and that Dr Vickey Huger would want the patient seen tomorrow. I let her know that the only thing I could do was send a message to Dr Vickey Huger to see if she was willing to open a new slot on her schedule to see the patient. Patients dtr advised that I would give them a call once I spoke with Dr Vickey Huger regarding this. Of note, Dr Fatima Sanger clinic notes from today are still listed as open and not complete. Also discussed with Sharrie Rothman.   Dr Dohmeier, please advise if you would like this patient added to your schedule tomorrow for consultation.

## 2022-11-02 NOTE — Patient Instructions (Signed)
Medication Instructions:  START Metoprolol succinate 25 mg daily  *If you need a refill on your cardiac medications before your next appointment, please call your pharmacy*   Lab Work: None needed  If you have labs (blood work) drawn today and your tests are completely normal, you will receive your results only by: MyChart Message (if you have MyChart) OR A paper copy in the mail If you have any lab test that is abnormal or we need to change your treatment, we will call you to review the results.   Testing/Procedures: Your physician has requested that you have an echocardiogram. Echocardiography is a painless test that uses sound waves to create images of your heart. It provides your doctor with information about the size and shape of your heart and how well your heart's chambers and valves are working. This procedure takes approximately one hour. There are no restrictions for this procedure. Please do NOT wear cologne, perfume, aftershave, or lotions (deodorant is allowed). Please arrive 15 minutes prior to your appointment time.     Follow-Up: At [facility], you and your health needs are our priority.  As part of our continuing mission to provide you with exceptional heart care, we have created designated Provider Care Teams.  These Care Teams include your primary Cardiologist (physician) and Advanced Practice Providers (APPs -  Physician Assistants and Nurse Practitioners) who all work together to provide you with the care you need, when you need it.  Your next appointment:   1 month(s)  Provider:   Rollene Rotunda, MD

## 2022-11-03 ENCOUNTER — Other Ambulatory Visit (INDEPENDENT_AMBULATORY_CARE_PROVIDER_SITE_OTHER): Payer: Self-pay

## 2022-11-03 DIAGNOSIS — R1312 Dysphagia, oropharyngeal phase: Secondary | ICD-10-CM

## 2022-11-03 DIAGNOSIS — M0609 Rheumatoid arthritis without rheumatoid factor, multiple sites: Secondary | ICD-10-CM

## 2022-11-03 DIAGNOSIS — Z4502 Encounter for adjustment and management of automatic implantable cardiac defibrillator: Secondary | ICD-10-CM

## 2022-11-03 DIAGNOSIS — G629 Polyneuropathy, unspecified: Secondary | ICD-10-CM

## 2022-11-03 DIAGNOSIS — Z0289 Encounter for other administrative examinations: Secondary | ICD-10-CM

## 2022-11-03 DIAGNOSIS — R490 Dysphonia: Secondary | ICD-10-CM

## 2022-11-03 NOTE — Telephone Encounter (Signed)
I need to get the rheumatology notes- what is the symptom that made her suspect Myasthenia?  I can open the  8 AM next Tuesday .

## 2022-11-03 NOTE — Progress Notes (Signed)
Sed rate is mildly elevated, anti-CCP negative, CK normal, rheumatoid factor negative.

## 2022-11-07 ENCOUNTER — Ambulatory Visit (INDEPENDENT_AMBULATORY_CARE_PROVIDER_SITE_OTHER): Payer: Medicare Other | Admitting: Neurology

## 2022-11-07 ENCOUNTER — Encounter: Payer: Self-pay | Admitting: Neurology

## 2022-11-07 VITALS — BP 101/64 | HR 92 | Ht 74.0 in

## 2022-11-07 DIAGNOSIS — R1312 Dysphagia, oropharyngeal phase: Secondary | ICD-10-CM | POA: Diagnosis not present

## 2022-11-07 DIAGNOSIS — G4731 Primary central sleep apnea: Secondary | ICD-10-CM

## 2022-11-07 DIAGNOSIS — G629 Polyneuropathy, unspecified: Secondary | ICD-10-CM | POA: Diagnosis not present

## 2022-11-07 DIAGNOSIS — R490 Dysphonia: Secondary | ICD-10-CM

## 2022-11-07 DIAGNOSIS — R5382 Chronic fatigue, unspecified: Secondary | ICD-10-CM

## 2022-11-07 DIAGNOSIS — M0609 Rheumatoid arthritis without rheumatoid factor, multiple sites: Secondary | ICD-10-CM

## 2022-11-07 DIAGNOSIS — I442 Atrioventricular block, complete: Secondary | ICD-10-CM

## 2022-11-07 NOTE — Progress Notes (Signed)
Provider:  [name], MD  Primary Care Physician:  Creola Corn, MD [address] [place] [address]     Referring Provider: Creola Corn, Md [address] [place],  [place] [address]          Chief Complaint according to patient   Patient presents with:     New Problem           HISTORY OF PRESENT ILLNESS:  Jeffery [name] is a 88 y.o. male patient who is here for revisit 11/07/2022 for  generalized weakness, worsening gait (rapidly ) and severe fatigue- trouble swallowing. Choked on water  right here   Slurred speech and droopy eyes, has been to [facility] and ended up in the local ED,   Last felt good in June on a steroid taper.  Dr Corliss Skains has decreased his methotraxate dose, but increased back last month. Chief concern according to patient : Rheumatologist concerned about MG being hidden behind his chronic immuno-suppression.  Ach Binding AB are not detected. Last Endoscopy 12-29-2021 with Dr Rhea Belton showed candidiasis.  Patient has been on methotrexate and intermittent steroids.  He is a pacemaker patient, He had central sleep apnea and did not respond well to BiPAP ASV. He is not sleeping well,sleeps better in a recliner.     First barium swallow test 12-07-2021: abnormal GI reflux and second test 01-18-2022:  12/07/2021 the test was done under Chestine Spore MD barium phagiogram. " Barium swallow" he ingested a 13 mm barium tablet that passed into the stomach without significant delay, moderate gastroesophageal reflux was noted after the swallowing and moderate aspiration was noted which was not silent.   Then in the second study from October tends there were different consistencies of barium administered under fluoroscopy.  The patient aspirated small amount of liquid barium contrast but he was able to swallow the 13 mm barium tablet.  So the barium tablet passed over the vallecular space, it cleared completely with a second bolus of pudding.  The study concluded  that he is in his elevated risk of aspiration if it if he becomes acutely ill or deconditioned.  And the sleep speech therapist also said that the patient's speech and articulation and resignation worsened at the end of the test there was an impairment and this would account for the lack fluctuation of his speech difficulties.     Chin tuck did help and was effective to prevent retention.  The epiglottic deflection was compromised =the airway closure was therefore compromised. The question is why ? Motor system is not the smooth muscle striate . Ach rec ab were tested .        RV and interval history: 04-16-2020: Jeffery [name]. He just had seen Dr Venetia Maxon who enforced having PT instead of surgery.  MOCA today 26/30 normal range and loss of 2 points on immediate recall.           02/02/2022    1:36 PM 04/16/2020    2:23 PM  Montreal Cognitive Assessment   Visuospatial/ Executive (0/5) 5 5  Naming (0/3) 3 2  Attention: Read list of digits (0/2) 2 1  Attention: Read list of letters (0/1) 1 1  Attention: Serial 7 subtraction starting at 100 (0/3) 3 3  Language: Repeat phrase (0/2) 2 2  Language : Fluency (0/1) 1 1  Abstraction (0/2) 2 2  Delayed Recall (0/5) 5 3  Orientation (  0/6) 6 6  Total 30 26     Review of Systems: Out of a complete 14 system review, the patient complains of only the following symptoms, and all other reviewed systems are negative.:  Fatigue, sleepiness , snoring, fragmented sleep, Insomnia   How likely are you to doze in the following situations: 0 = not likely, 1 = slight chance, 2 = moderate chance, 3 = high chance   Sitting and Reading? Watching Television? Sitting inactive in a public place (theater or meeting)? As a passenger in a car for an hour without a break? Lying down in the afternoon when circumstances permit? Sitting and talking to someone? Sitting quietly after lunch without alcohol? In a car, while stopped for a  few minutes in traffic?   Total = 10/ 24 points   FSS endorsed at 48/ 63 points.   Social History   Socioeconomic History   Marital status: Married    Spouse name: Not on file   Number of children: 2   Years of education: Not on file   Highest education level: Not on file  Occupational History   Not on file  Tobacco Use   Smoking status: Never    Passive exposure: Never   Smokeless tobacco: Never  Vaping Use   Vaping status: Never Used  Substance and Sexual Activity   Alcohol use: Not Currently   Drug use: No   Sexual activity: Not on file  Other Topics Concern   Not on file  Social History Narrative   Right handed, Retired .Married, 2 kids. Caffeine none.   3 Step    Social Determinants of Health   Financial Resource Strain: Not on file  Food Insecurity: Not on file  Transportation Needs: Not on file  Physical Activity: Not on file  Stress: Not on file  Social Connections: Unknown (10/28/2022)   Received from [facility]   Social Network    Social Network: Not on file    Family History  Problem Relation Age of Onset   Heart failure Mother        Died age 35.  Apparently had valve surgery.    Congenital heart disease Brother    Stroke Maternal Grandfather    Colon cancer Neg Hx    Stomach cancer Neg Hx    Esophageal cancer Neg Hx    Rectal cancer Neg Hx     Past Medical History:  Diagnosis Date   Arthritis    Autoimmune disorder (HCC)    BCC (basal cell carcinoma)    BPH (benign prostatic hypertrophy) 2008   Cancer [facility]) 2008   Prostate   Cervical stenosis of spine    per patient    Complete heart block (HCC)    Complex sleep apnea syndrome    does not use CPAP   Diabetes mellitus without complication (HCC)    Diverticulosis    ED (erectile dysfunction)    Gastritis    GERD (gastroesophageal reflux disease)    Hemorrhoids    Hernia, inguinal    Hyperlipidemia    IFG (impaired fasting glucose)    Osteopenia    Peptic ulcer disease with  hemorrhage 1993   Shingles    Sleep apnea    Vitamin D deficiency     Past Surgical History:  Procedure Laterality Date   BACK SURGERY N/A 1984   barium swallow study  2023   x2   CATARACT EXTRACTION, BILATERAL     HERNIA REPAIR  LAMINECTOMY  1984   L 5   PACEMAKER IMPLANT  12/05/2016   MDT Azure XT DR MRI conditional PPM implanted for CHB by Dr Barnie Mort in [place]   PACEMAKER INSERTION  12/05/2016   PROSTATE SURGERY  12/2006   Robotic   TONSILLECTOMY  88 years old   TRIGGER FINGER RELEASE  03/01/2012   Procedure: MINOR RELEASE TRIGGER FINGER/A-1 PULLEY;  Surgeon: [name], MD;  Location: [facility];  Service: Orthopedics;  Laterality: Left;  long finger   TRIGGER FINGER RELEASE Left 02/08/2018   Procedure: RELEASE TRIGGER FINGER/A-1 PULLEY LEFT RING FINGER;  Surgeon: Cindee Salt, MD;  Location: [facility];  Service: Orthopedics;  Laterality: Left;   UPPER GI ENDOSCOPY  2023     Current Outpatient Medications on File Prior to Visit  Medication Sig Dispense Refill   acetaminophen (TYLENOL) 500 MG tablet Take 500 mg by mouth every 6 (six) hours as needed.     cholecalciferol (VITAMIN D3) 25 MCG (1000 UNIT) tablet Take 1,000 Units by mouth daily.     diclofenac Sodium (VOLTAREN) 1 % GEL Apply 2-4 grams to affected joint 4 times daily as needed. 400 g 2   folic acid (FOLVITE) 1 MG tablet Take 1 tablet (1 mg total) by mouth daily. 90 tablet 3   glucose blood test strip Use to check blood sugar 2 (two) times daily. 200 each 3   hydroxychloroquine (PLAQUENIL) 200 MG tablet Take 1 tablet (200 mg total) by mouth 2 (two) times daily. 180 tablet 0   Lancets (ONETOUCH DELICA PLUS LANCET33G) MISC Check blood sugar two times a day. 200 each 3   metFORMIN (GLUCOPHAGE) 500 MG tablet Take 500 mg by mouth 2 (two) times daily with a meal.     methotrexate (RHEUMATREX) 2.5 MG tablet Take 6 tablets (15 mg total) by mouth once a week. 72 tablet 0   metoprolol  succinate (TOPROL XL) 25 MG 24 hr tablet Take 1 tablet (25 mg total) by mouth daily. 90 tablet 3   ONE TOUCH ULTRA TEST test strip TEST BLOOD SUGAR BID AS DIRECTED  2   vitamin E 100 UNIT capsule Take 100 Units by mouth daily.     omega-3 acid ethyl esters (LOVAZA) 1 g capsule Take 1 capsule (1 g total) by mouth 3 (three) times a week. (Patient not taking: Reported on 11/07/2022) 36 capsule 0   TUBERCULIN SYR 1CC/27GX1/2" (B-D TB SYRINGE 1CC/27GX1/2") 27G X 1/2" 1 ML MISC USE TO INJECT METHOTREXATE (Patient not taking: Reported on 11/07/2022) 12 each 3   zolpidem (AMBIEN) 5 MG tablet Take 5 mg by mouth as needed. (Patient not taking: Reported on 11/07/2022)     No current facility-administered medications on file prior to visit.    Allergies  Allergen Reactions   Sulfasalazine Anaphylaxis   Demerol [Meperidine] Nausea Only   Garlic    Other     SHALLOTS   Sulfa Antibiotics      DIAGNOSTIC DATA (LABS, IMAGING, TESTING) - I reviewed patient records, labs, notes, testing and imaging myself where available.  Lab Results  Component Value Date   WBC 4.0 05/19/2022   HGB 14.2 05/19/2022   HCT 41.6 05/19/2022   MCV 95.9 05/19/2022   PLT 229 05/19/2022      Component Value Date/Time   NA 140 05/19/2022 1337   NA 142 03/19/2019 1537   K 5.3 05/19/2022 1337   CL 99 05/19/2022 1337   CO2 33 (H)  05/19/2022 1337   GLUCOSE 112 (H) 05/19/2022 1337   BUN 14 05/19/2022 1337   BUN 16 03/19/2019 1537   CREATININE 0.77 05/19/2022 1337   CALCIUM 9.7 05/19/2022 1337   PROT 6.8 05/19/2022 1337   PROT 6.9 02/02/2022 1509   ALBUMIN 4.7 (H) 03/19/2019 1537   AST 9 (L) 05/19/2022 1337   ALT 12 05/19/2022 1337   ALKPHOS 85 03/19/2019 1537   BILITOT 0.6 05/19/2022 1337   BILITOT 0.5 03/19/2019 1537   GFRNONAA 88 08/10/2020 1538   GFRAA 102 08/10/2020 1538   No results found for: "CHOL", "HDL", "LDLCALC", "LDLDIRECT", "TRIG", "CHOLHDL" No results found for: "HGBA1C" Lab Results  Component  Value Date   VITAMINB12 447 01/08/2020   Lab Results  Component Value Date   TSH 1.34 01/24/2017    PHYSICAL EXAM:  Today's Vitals   11/07/22 0815  BP: 101/64  Pulse: 92  Weight: 160 lb (72.6 kg)  Height: 6\' 2"  (1.88 m)   Body mass index is 20.54 kg/m.   Wt Readings from Last 3 Encounters:  11/07/22 160 lb (72.6 kg)  11/02/22 153 lb (69.4 kg)  11/02/22 154 lb (69.9 kg)     Ht Readings from Last 3 Encounters:  11/07/22 6\' 2"  (1.88 m)  11/02/22 6\' 2"  (1.88 m)  11/02/22 6\' 2"  (1.88 m)      General: The patient is awake, alert and appears not in acute distress. The patient is well groomed. Head: Normocephalic, atraumatic. Neck is supple. Cardiovascular:  irregular rate and cardiac rhythm by pulse,  without distended neck veins. Respiratory: Lungs are clear to auscultation.  Skin:  Without evidence of ankle edema, or rash. Trunk: The patient's posture is erect.   NEUROLOGIC EXAM: The patient is awake and alert, oriented to place and time.   Memory subjective described as intact.  Attention span & concentration ability appears normal.  Speech is fluent,  without  dysarthria, dysphonia or aphasia.  Mood and affect are appropriate.   Cranial nerves: no loss of smell or taste reported  Pupils are equal and briskly reactive to light. Extraocular movements in vertical and horizontal planes were intact . He always had a mild ptosis and a lazy eye.Marland Kitchen No Diplopia. Visual fields by finger perimetry are intact. Hearing was intact to soft voice and finger rubbing.    Facial sensation intact to fine touch.  Facial motor strength is symmetric and tongue and uvula move midline. Yet, his speech is slurred" tongue heavy". Neck ROM : rotation, tilt and flexion extension were normal for age and shoulder shrug was symmetrical.    Motor exam:  Symmetric bulk, tone and ROM.   significant fatigability  Normal tone without cog- wheeling, symmetric grip strength ,Restricted to the  arthritic  pain. .   Sensory:  Fine touch, pinprick and vibration were tested  and  normal.  Proprioception tested in the upper extremities was normal.   Coordination: Rapid alternating movements in the fingers/hands were of normal speed.  The Finger-to-nose maneuver was intact without evidence of ataxia, dysmetria or tremor.   Gait and station: Patient could rise unassisted from a seated position, walked with a cane , assistive device.  Stance is of normal width/ base and the patient turned with 3 steps.  Toe and heel walk were deferred. His gait slows as he walks, he is unsteady.  Deep tendon reflexes: in the  upper and lower extremities are symmetric and intact.  Babinski response was deferred .    ASSESSMENT  AND PLAN 88 y.o. year old male  here with:    1) Extreme fatigability of muscles, visible in gait, swallowing and facial droop. He sleeps in a recliner.   2) Myasthenia could be covered behind the MTX, and steroids, but steroids helped to feel better-   3) Needing an oral mestinon test- can he monitored at [facility]? Sent message to dr Chales Abrahams .  4) I will have the CT chest reviewed for thymoma.  5) EMG/ NCV for myasthenia ordered.  6) CkCKMB. 7) nano- mister for oral dryness.     I plan to follow up either personally or through our NP within 2-3 months.   I would like to thank Creola Corn, MD for allowing me to meet with and to take care of this pleasant patient.   CC: I will share my notes with .  After spending a total time of  35  minutes face to face and additional time for physical and neurologic examination, review of laboratory studies,  personal review of imaging studies, reports and results of other testing and review of referral information / records as far as provided in visit,   Electronically signed by: [name], MD 11/07/2022 8:37 AM  [name] Sleep Board certified by The [employer] of Sleep Medicine and Diplomate of  the [employer] of Sleep Medicine. Board certified In Neurology through the ABPN, Fellow of the [employer] of Neurology.

## 2022-11-08 ENCOUNTER — Other Ambulatory Visit (HOSPITAL_BASED_OUTPATIENT_CLINIC_OR_DEPARTMENT_OTHER): Payer: Self-pay

## 2022-11-08 ENCOUNTER — Telehealth: Payer: Self-pay | Admitting: Neurology

## 2022-11-08 NOTE — Telephone Encounter (Signed)
He is not my Patient. He does not yet live in [place]. I have never seen him I have changed his PCP to Dr Timothy Lasso.

## 2022-11-08 NOTE — Telephone Encounter (Signed)
Referral for physical therapy fax to [facility]. Phone: [phone], Fax: [phone]

## 2022-11-09 ENCOUNTER — Telehealth: Payer: Self-pay | Admitting: Neurology

## 2022-11-09 NOTE — Telephone Encounter (Signed)
Referral for speech therapy fax to [facility] to see Dr Genevieve Norlander. Timothy Lasso. Phone: [phone], Fax: [phone]

## 2022-11-09 NOTE — Telephone Encounter (Signed)
As requested by the nurse, resent referral for physical therapy to [facility]. Phone: [phone], Fax: [phone]

## 2022-11-16 ENCOUNTER — Other Ambulatory Visit (HOSPITAL_BASED_OUTPATIENT_CLINIC_OR_DEPARTMENT_OTHER): Payer: Self-pay

## 2022-11-17 ENCOUNTER — Other Ambulatory Visit (HOSPITAL_BASED_OUTPATIENT_CLINIC_OR_DEPARTMENT_OTHER): Payer: Self-pay

## 2022-11-17 MED ORDER — ONETOUCH ULTRA TEST VI STRP
ORAL_STRIP | 3 refills | Status: DC
  Filled 2022-11-17 – 2023-07-26 (×2): qty 200, 100d supply, fill #0

## 2022-11-22 ENCOUNTER — Encounter: Payer: Self-pay | Admitting: Neurology

## 2022-11-23 ENCOUNTER — Other Ambulatory Visit (HOSPITAL_BASED_OUTPATIENT_CLINIC_OR_DEPARTMENT_OTHER): Payer: Self-pay

## 2022-11-25 ENCOUNTER — Ambulatory Visit (HOSPITAL_COMMUNITY): Payer: Medicare Other | Attending: Internal Medicine

## 2022-11-25 DIAGNOSIS — R9431 Abnormal electrocardiogram [ECG] [EKG]: Secondary | ICD-10-CM | POA: Diagnosis not present

## 2022-11-25 LAB — ECHOCARDIOGRAM COMPLETE
Area-P 1/2: 3.99 cm2
S' Lateral: 2.6 cm

## 2022-11-29 ENCOUNTER — Other Ambulatory Visit (HOSPITAL_BASED_OUTPATIENT_CLINIC_OR_DEPARTMENT_OTHER): Payer: Self-pay

## 2022-12-05 ENCOUNTER — Other Ambulatory Visit: Payer: Self-pay | Admitting: Neurology

## 2022-12-05 ENCOUNTER — Telehealth: Payer: Self-pay

## 2022-12-05 ENCOUNTER — Other Ambulatory Visit (HOSPITAL_BASED_OUTPATIENT_CLINIC_OR_DEPARTMENT_OTHER): Payer: Self-pay

## 2022-12-05 MED ORDER — PYRIDOSTIGMINE BROMIDE 60 MG PO TABS
60.0000 mg | ORAL_TABLET | Freq: Once | ORAL | 0 refills | Status: DC | PRN
  Filled 2022-12-05: qty 5, 5d supply, fill #0

## 2022-12-05 NOTE — Telephone Encounter (Signed)
The patient called to cancel his upcoming appointment with Renee (8/28). He would like for the scheduler to give him a call to reschedule.

## 2022-12-06 NOTE — Telephone Encounter (Signed)
Pt is coming in on 8/28 to be evaluated in the office by Dr Dohmeier while taking the oral medication

## 2022-12-07 ENCOUNTER — Ambulatory Visit: Payer: Medicare Other | Admitting: Physician Assistant

## 2022-12-07 ENCOUNTER — Other Ambulatory Visit (HOSPITAL_BASED_OUTPATIENT_CLINIC_OR_DEPARTMENT_OTHER): Payer: Self-pay

## 2022-12-07 ENCOUNTER — Institutional Professional Consult (permissible substitution) (HOSPITAL_BASED_OUTPATIENT_CLINIC_OR_DEPARTMENT_OTHER): Payer: Medicare Other | Admitting: Pulmonary Disease

## 2022-12-07 ENCOUNTER — Encounter: Payer: Self-pay | Admitting: Neurology

## 2022-12-07 ENCOUNTER — Ambulatory Visit: Payer: Medicare Other | Admitting: Neurology

## 2022-12-07 VITALS — BP 127/68 | HR 87 | Ht 74.0 in | Wt 149.4 lb

## 2022-12-07 DIAGNOSIS — R4781 Slurred speech: Secondary | ICD-10-CM | POA: Insufficient documentation

## 2022-12-07 DIAGNOSIS — R2689 Other abnormalities of gait and mobility: Secondary | ICD-10-CM | POA: Diagnosis not present

## 2022-12-07 DIAGNOSIS — R269 Unspecified abnormalities of gait and mobility: Secondary | ICD-10-CM | POA: Insufficient documentation

## 2022-12-07 DIAGNOSIS — H02402 Unspecified ptosis of left eyelid: Secondary | ICD-10-CM | POA: Diagnosis not present

## 2022-12-07 MED ORDER — PREDNISONE 5 MG PO TABS
ORAL_TABLET | ORAL | 0 refills | Status: AC
  Filled 2022-12-07: qty 12, 9d supply, fill #0

## 2022-12-07 NOTE — Progress Notes (Signed)
         Provider:  [name], MD  Primary Care Physician:  Creola Corn, MD [address] [place] [address]     Referring Provider: Creola Corn, Md [address] [place],  [place] [address]          Chief Complaint according to patient   Patient presents with:    Patient in lab #2 with his daughter and wife.             HISTORY OF PRESENT ILLNESS:  Jeffery [name] is a 88 y.o. male patient who is here for revisit 12/07/2022 for  MESTINON Challenge test.    He had fallen at [facility] on the way from the dining room. Feels his legs  no known viral illness. His speech is affected, and he may try some steroid dose pack.     This test consisted of 2 doses of 60 mg mestinon , 30 minutes apart. No change in eye movements, no ptosis at baseline , no pronator drift.   The first dose was given at 11:49 AM and a blood pressure of 131/78 was measured irregular heart rate of 73 bpm, the patient was able even then to extend his arms for over 20 seconds without any pronator drift, his eye movements are at baseline disconjugate he was able to lift and look upwards for over 12 seconds without any ptosis.  There was no ptosis noted in the interval.'s, the patient did present with slightly slurring of speech.  The 60 mg Mestinon dose was given at 1151 orally retest at blood pressure 138/77 heart rate of 69 and again no evidence of ptosis no evidence of pronator drift on 1220 we gave the second dose of Mestinon again 60 mg p.o. with a sip of water.  At 1235 he measured the blood pressure at 136/79 heart rate at 63 bpm the patient was not flushed did not have any obvious side effects or effects and retesting showed no changes as to eye mobility eyelid opening forearm strength.  Slurred speech persisted.  Normal BP, unchanged over the 80 minute observation time, and he reports no side effects.  He walked by walker.  He feels he is getting better.    PT, OT and ST ordered at  [facility].  12.45 PM , end of observation.   I plan to follow up either personally or through our NP within 5-6 months.   I would like to thank Creola Corn, MD  for allowing me to meet with and to take care of this pleasant patient.   CC: I will share my notes with [facility] .   Electronically signed by: [name], MD 12/07/2022 12:01 PM  [name] Sleep Board certified by The [employer] of Sleep Medicine and Diplomate of the [employer] Sleep Medicine. Board certified In Neurology through the ABPN, Fellow of the [employer] Neu[employer].

## 2022-12-09 ENCOUNTER — Other Ambulatory Visit (HOSPITAL_BASED_OUTPATIENT_CLINIC_OR_DEPARTMENT_OTHER): Payer: Self-pay

## 2022-12-11 ENCOUNTER — Encounter: Payer: Self-pay | Admitting: Rheumatology

## 2022-12-12 DIAGNOSIS — I493 Ventricular premature depolarization: Secondary | ICD-10-CM | POA: Insufficient documentation

## 2022-12-12 NOTE — Progress Notes (Unsigned)
  Cardiology Office Note:   Date:  12/15/2022  ID:  Jeffery [name], Jeffery [name] Jul 28, 1934, MRN [id number] PCP: Creola Corn, MD  [facility] Providers Cardiologist:  Rollene Rotunda, MD {  History of Present Illness:   Jeffery [name] is a 88 y.o. male who presents for follow up of third degree HB.  He has a pacemaker per Dr. Johney Frame.  He had a follow up echo in December 2021.  He has mildly enlarged aortic root.  He had a CT 06/2022 with a 40 mm aorta with small pulmonary nodules.  Of note he did have some aortic atherosclerosis, coronary calcium as noted previously on echo mitral annular calcification.  He had increased fatigue and frequent PVCs.  It was suggested that he might start beta-blockers but he did not want to do this because of the fatigue.  He is having extensive workup and for a while there was a thought that he might have myasthenia gravis but this turned out to be a negative workup.  He is starting to slowly get better from the fatigue but he still not at baseline.  He does not sleep well.  He is lost a little weight.  I did review all of these records including his extensive blood work and there is no clear etiology.  Echocardiography was essentially unremarkable with a low normal ejection fraction and no other significant abnormalities after he was seen in our clinic.  He has normal pacemaker function.  ROS: As stated in the HPI and negative for all other systems.  Studies Reviewed:    EKG:   NA  Risk Assessment/Calculations:              Physical Exam:   VS:  BP 134/66 (BP Location: Left Arm, Patient Position: Sitting, Cuff Size: Normal)   Pulse 86   Ht 6\' 1"  (1.854 m)   Wt 148 lb 12.8 oz (67.5 kg)   SpO2 98%   BMI 19.63 kg/m    Wt Readings from Last 3 Encounters:  12/15/22 148 lb 12.8 oz (67.5 kg)  12/07/22 149 lb 6.4 oz (67.8 kg)  11/02/22 153 lb (69.4 kg)     GEN: Well nourished, well developed in no acute distress NECK: No JVD; No carotid  bruits CARDIAC: RRR, no murmurs, rubs, gallops RESPIRATORY:  Clear to auscultation without rales, wheezing or rhonchi  ABDOMEN: Soft, non-tender, non-distended EXTREMITIES:  No edema; No deformity   ASSESSMENT AND PLAN:   PVCs: I agree that he probably would be more fatigued on beta-blockers and I do not think he is particularly symptomatic with the PVCs.  Therefore, at this point I would not make any change in therapy.  He is having normal labs.  CHB: He has normal pacemaker function.  No change in therapy.  HTN: His blood pressure is well-controlled.  Continue the meds as listed.       Follow up with me in one year.   Signed, Rollene Rotunda, MD

## 2022-12-13 ENCOUNTER — Ambulatory Visit (INDEPENDENT_AMBULATORY_CARE_PROVIDER_SITE_OTHER): Payer: Medicare Other

## 2022-12-13 DIAGNOSIS — I442 Atrioventricular block, complete: Secondary | ICD-10-CM | POA: Diagnosis not present

## 2022-12-13 NOTE — Progress Notes (Signed)
Step    Immunization History  Administered Date(s) Administered   COVID-19, mRNA, vaccine(Comirnaty)12 years and older 03/17/2022   Influenza, High Dose Seasonal PF 03/03/2017, 01/26/2018   Moderna SARS-COV2 Booster Vaccination 10/15/2020   Moderna Sars-Covid-2 Vaccination 04/13/2019, 05/14/2019, 12/12/2019     Objective: Vital Signs: BP 123/74 (BP Location: Left Arm, Patient Position: Sitting, Cuff Size: Normal)   Pulse 82   Resp 13   Ht 6\' 1"  (1.854 m)   Wt 148 lb 6.4 oz (67.3 kg)   BMI 19.58 kg/m    Physical Exam Vitals and nursing note reviewed.  Constitutional:      Appearance: He is well-developed.  HENT:     Head: Normocephalic and atraumatic.  Eyes:     Conjunctiva/sclera: Conjunctivae normal.     Pupils: Pupils are equal, round, and reactive to light.  Cardiovascular:     Rate and Rhythm: Normal rate and regular rhythm.     Heart sounds: Normal heart sounds.     Comments: Pacemaker Pulmonary:     Effort: Pulmonary effort is normal.     Breath sounds: Normal breath sounds.  Abdominal:     General: Bowel sounds are normal.     Palpations: Abdomen is soft.   Musculoskeletal:     Cervical back: Normal range of motion and neck supple.  Skin:    General: Skin is warm and dry.     Capillary Refill: Capillary refill takes less than 2 seconds.  Neurological:     Mental Status: He is alert and oriented to person, place, and time.  Psychiatric:        Behavior: Behavior normal.      Musculoskeletal Exam: He had limited lateral rotation of the cervical spine without discomfort.  Mild thoracic kyphosis was noted.  There was no tenderness over thoracic or lumbar spine.  Shoulder joints, elbow joints, wrist joints, MCPs PIPs and DIPs were in good range of motion with no synovitis.  Hip joints and knee joints were in good range of motion without any warmth swelling or effusion.  There was no tenderness over ankles or MTPs.  CDAI Exam: CDAI Score: -- Patient Global: --; Provider Global: -- Swollen: --; Tender: -- Joint Exam 12/23/2022   No joint exam has been documented for this visit   There is currently no information documented on the homunculus. Go to the Rheumatology activity and complete the homunculus joint exam.  Investigation: No additional findings.  Imaging: CUP PACEART REMOTE DEVICE CHECK  Result Date: 12/14/2022 Scheduled remote reviewed. Normal device function.  Next remote 91 days. [place], [facility]  ECHOCARDIOGRAM COMPLETE  Result Date: 11/25/2022    ECHOCARDIOGRAM REPORT   Patient Name:   JACORY MCGRANE Date of Exam: 11/25/2022 Medical Rec #:  [id number]           Height:       74.0 in Accession #:    [id number]          Weight:       153.0 lb Date of Birth:  09-03-1934           BSA:          1.940 m Patient Age:    87 years            BP:           126/64 mmHg Patient Gender: M                   HR:  Office Visit Note  Patient: Berk Hershey Curcio             Date of Birth: April 25, 1934           MRN: [id number]             PCP: Creola Corn, MD Referring: Creola Corn, MD Visit Date: 12/23/2022 Occupation: @GUAROCC @  Subjective:  Fatigue and stiffness  History of Present Illness: Adam H Dimario Hammerschmidt is a 88 y.o. male with polymyalgia rheumatica, Ro rheumatoid arthritis osteoarthritis and degenerative disc disease.  He returns today after his last visit on November 02, 2022.  At the time he was experiencing fatigue and shortness of breath.  He also has been noticing slurring of his speech.  He had an extensive workup by Dr. Vickey Huger for myasthenia gravis which was negative.  He has been referred to speech therapy, physical therapy and Occupational Therapy.  He had workup by his cardiologist including echocardiogram which was unremarkable.  An appointment with the pulmonologist is coming up.  He has been using a walker for ambulation due to generalized weakness.  He also reports some weight loss since the last visit.  He has noticed some improvement in his strength since the last visit.  There is no history of joint swelling.    Activities of Daily Living:  Patient reports morning stiffness for 1 hour.   Patient Reports nocturnal pain.  Difficulty dressing/grooming: Denies Difficulty climbing stairs: Reports Difficulty getting out of chair: Denies Difficulty using hands for taps, buttons, cutlery, and/or writing: Denies  Review of Systems  Constitutional:  Positive for fatigue and weight loss.  HENT:  Positive for mouth dryness. Negative for mouth sores.   Eyes:  Negative for dryness.  Respiratory:  Positive for shortness of breath.        With exertion   Cardiovascular:  Negative for chest pain and palpitations.  Gastrointestinal:  Negative for blood in stool, constipation and diarrhea.  Endocrine: Negative for increased urination.  Genitourinary:  Negative for involuntary urination.   Musculoskeletal:  Positive for joint pain, gait problem, joint pain, myalgias, muscle weakness, morning stiffness, muscle tenderness and myalgias. Negative for joint swelling.  Skin:  Negative for color change, rash, hair loss and sensitivity to sunlight.  Allergic/Immunologic: Negative for susceptible to infections.  Neurological:  Negative for dizziness and headaches.  Hematological:  Negative for swollen glands.  Psychiatric/Behavioral:  Positive for sleep disturbance. Negative for depressed mood. The patient is not nervous/anxious.     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   Cystoid macular edema of right eye 08/26/2019   Pseudophakia of both eyes 08/26/2019   Obstructive sleep apnea of adult 08/26/2019   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 male with polymyalgia rheumatica, Ro rheumatoid arthritis osteoarthritis and degenerative disc disease.  He returns today after his last visit on November 02, 2022.  At the time he was experiencing fatigue and shortness of breath.  He also has been noticing slurring of his speech.  He had an extensive workup by Dr. Vickey Huger for myasthenia gravis which was negative.  He has been referred to speech therapy, physical therapy and Occupational Therapy.  He had workup by his cardiologist including echocardiogram which was unremarkable.  An appointment with the pulmonologist is coming up.  He has been using a walker for ambulation due to generalized weakness.  He also reports some weight loss since the last visit.  He has noticed some improvement in his strength since the last visit.  There is no history of joint swelling.    Activities of Daily Living:  Patient reports morning stiffness for 1 hour.   Patient Reports nocturnal pain.  Difficulty dressing/grooming: Denies Difficulty climbing stairs: Reports Difficulty getting out of chair: Denies Difficulty using hands for taps, buttons, cutlery, and/or writing: Denies  Review of Systems  Constitutional:  Positive for fatigue and weight loss.  HENT:  Positive for mouth dryness. Negative for mouth sores.   Eyes:  Negative for dryness.  Respiratory:  Positive for shortness of breath.        With exertion   Cardiovascular:  Negative for chest pain and palpitations.  Gastrointestinal:  Negative for blood in stool, constipation and diarrhea.  Endocrine: Negative for increased urination.  Genitourinary:  Negative for involuntary urination.   Musculoskeletal:  Positive for joint pain, gait problem, joint pain, myalgias, muscle weakness, morning stiffness, muscle tenderness and myalgias. Negative for joint swelling.  Skin:  Negative for color change, rash, hair loss and sensitivity to sunlight.  Allergic/Immunologic: Negative for susceptible to infections.  Neurological:  Negative for dizziness and headaches.  Hematological:  Negative for swollen glands.  Psychiatric/Behavioral:  Positive for sleep disturbance. Negative for depressed mood. The patient is not nervous/anxious.     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   Cystoid macular edema of right eye 08/26/2019   Pseudophakia of both eyes 08/26/2019   Obstructive sleep apnea of adult 08/26/2019   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) 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 08/24/2019   Educated about COVID-19 virus infection 10/08/2018   Aortic root dilatation (HCC) 10/08/2018   Mitral annular calcification 10/08/2018   Neuropathy 08/21/2017  Step    Immunization History  Administered Date(s) Administered   COVID-19, mRNA, vaccine(Comirnaty)12 years and older 03/17/2022   Influenza, High Dose Seasonal PF 03/03/2017, 01/26/2018   Moderna SARS-COV2 Booster Vaccination 10/15/2020   Moderna Sars-Covid-2 Vaccination 04/13/2019, 05/14/2019, 12/12/2019     Objective: Vital Signs: BP 123/74 (BP Location: Left Arm, Patient Position: Sitting, Cuff Size: Normal)   Pulse 82   Resp 13   Ht 6\' 1"  (1.854 m)   Wt 148 lb 6.4 oz (67.3 kg)   BMI 19.58 kg/m    Physical Exam Vitals and nursing note reviewed.  Constitutional:      Appearance: He is well-developed.  HENT:     Head: Normocephalic and atraumatic.  Eyes:     Conjunctiva/sclera: Conjunctivae normal.     Pupils: Pupils are equal, round, and reactive to light.  Cardiovascular:     Rate and Rhythm: Normal rate and regular rhythm.     Heart sounds: Normal heart sounds.     Comments: Pacemaker Pulmonary:     Effort: Pulmonary effort is normal.     Breath sounds: Normal breath sounds.  Abdominal:     General: Bowel sounds are normal.     Palpations: Abdomen is soft.   Musculoskeletal:     Cervical back: Normal range of motion and neck supple.  Skin:    General: Skin is warm and dry.     Capillary Refill: Capillary refill takes less than 2 seconds.  Neurological:     Mental Status: He is alert and oriented to person, place, and time.  Psychiatric:        Behavior: Behavior normal.      Musculoskeletal Exam: He had limited lateral rotation of the cervical spine without discomfort.  Mild thoracic kyphosis was noted.  There was no tenderness over thoracic or lumbar spine.  Shoulder joints, elbow joints, wrist joints, MCPs PIPs and DIPs were in good range of motion with no synovitis.  Hip joints and knee joints were in good range of motion without any warmth swelling or effusion.  There was no tenderness over ankles or MTPs.  CDAI Exam: CDAI Score: -- Patient Global: --; Provider Global: -- Swollen: --; Tender: -- Joint Exam 12/23/2022   No joint exam has been documented for this visit   There is currently no information documented on the homunculus. Go to the Rheumatology activity and complete the homunculus joint exam.  Investigation: No additional findings.  Imaging: CUP PACEART REMOTE DEVICE CHECK  Result Date: 12/14/2022 Scheduled remote reviewed. Normal device function.  Next remote 91 days. [place], [facility]  ECHOCARDIOGRAM COMPLETE  Result Date: 11/25/2022    ECHOCARDIOGRAM REPORT   Patient Name:   JACORY MCGRANE Date of Exam: 11/25/2022 Medical Rec #:  [id number]           Height:       74.0 in Accession #:    [id number]          Weight:       153.0 lb Date of Birth:  09-03-1934           BSA:          1.940 m Patient Age:    87 years            BP:           126/64 mmHg Patient Gender: M                   HR:  Step    Immunization History  Administered Date(s) Administered   COVID-19, mRNA, vaccine(Comirnaty)12 years and older 03/17/2022   Influenza, High Dose Seasonal PF 03/03/2017, 01/26/2018   Moderna SARS-COV2 Booster Vaccination 10/15/2020   Moderna Sars-Covid-2 Vaccination 04/13/2019, 05/14/2019, 12/12/2019     Objective: Vital Signs: BP 123/74 (BP Location: Left Arm, Patient Position: Sitting, Cuff Size: Normal)   Pulse 82   Resp 13   Ht 6\' 1"  (1.854 m)   Wt 148 lb 6.4 oz (67.3 kg)   BMI 19.58 kg/m    Physical Exam Vitals and nursing note reviewed.  Constitutional:      Appearance: He is well-developed.  HENT:     Head: Normocephalic and atraumatic.  Eyes:     Conjunctiva/sclera: Conjunctivae normal.     Pupils: Pupils are equal, round, and reactive to light.  Cardiovascular:     Rate and Rhythm: Normal rate and regular rhythm.     Heart sounds: Normal heart sounds.     Comments: Pacemaker Pulmonary:     Effort: Pulmonary effort is normal.     Breath sounds: Normal breath sounds.  Abdominal:     General: Bowel sounds are normal.     Palpations: Abdomen is soft.   Musculoskeletal:     Cervical back: Normal range of motion and neck supple.  Skin:    General: Skin is warm and dry.     Capillary Refill: Capillary refill takes less than 2 seconds.  Neurological:     Mental Status: He is alert and oriented to person, place, and time.  Psychiatric:        Behavior: Behavior normal.      Musculoskeletal Exam: He had limited lateral rotation of the cervical spine without discomfort.  Mild thoracic kyphosis was noted.  There was no tenderness over thoracic or lumbar spine.  Shoulder joints, elbow joints, wrist joints, MCPs PIPs and DIPs were in good range of motion with no synovitis.  Hip joints and knee joints were in good range of motion without any warmth swelling or effusion.  There was no tenderness over ankles or MTPs.  CDAI Exam: CDAI Score: -- Patient Global: --; Provider Global: -- Swollen: --; Tender: -- Joint Exam 12/23/2022   No joint exam has been documented for this visit   There is currently no information documented on the homunculus. Go to the Rheumatology activity and complete the homunculus joint exam.  Investigation: No additional findings.  Imaging: CUP PACEART REMOTE DEVICE CHECK  Result Date: 12/14/2022 Scheduled remote reviewed. Normal device function.  Next remote 91 days. [place], [facility]  ECHOCARDIOGRAM COMPLETE  Result Date: 11/25/2022    ECHOCARDIOGRAM REPORT   Patient Name:   JACORY MCGRANE Date of Exam: 11/25/2022 Medical Rec #:  [id number]           Height:       74.0 in Accession #:    [id number]          Weight:       153.0 lb Date of Birth:  09-03-1934           BSA:          1.940 m Patient Age:    87 years            BP:           126/64 mmHg Patient Gender: M                   HR:  Step    Immunization History  Administered Date(s) Administered   COVID-19, mRNA, vaccine(Comirnaty)12 years and older 03/17/2022   Influenza, High Dose Seasonal PF 03/03/2017, 01/26/2018   Moderna SARS-COV2 Booster Vaccination 10/15/2020   Moderna Sars-Covid-2 Vaccination 04/13/2019, 05/14/2019, 12/12/2019     Objective: Vital Signs: BP 123/74 (BP Location: Left Arm, Patient Position: Sitting, Cuff Size: Normal)   Pulse 82   Resp 13   Ht 6\' 1"  (1.854 m)   Wt 148 lb 6.4 oz (67.3 kg)   BMI 19.58 kg/m    Physical Exam Vitals and nursing note reviewed.  Constitutional:      Appearance: He is well-developed.  HENT:     Head: Normocephalic and atraumatic.  Eyes:     Conjunctiva/sclera: Conjunctivae normal.     Pupils: Pupils are equal, round, and reactive to light.  Cardiovascular:     Rate and Rhythm: Normal rate and regular rhythm.     Heart sounds: Normal heart sounds.     Comments: Pacemaker Pulmonary:     Effort: Pulmonary effort is normal.     Breath sounds: Normal breath sounds.  Abdominal:     General: Bowel sounds are normal.     Palpations: Abdomen is soft.   Musculoskeletal:     Cervical back: Normal range of motion and neck supple.  Skin:    General: Skin is warm and dry.     Capillary Refill: Capillary refill takes less than 2 seconds.  Neurological:     Mental Status: He is alert and oriented to person, place, and time.  Psychiatric:        Behavior: Behavior normal.      Musculoskeletal Exam: He had limited lateral rotation of the cervical spine without discomfort.  Mild thoracic kyphosis was noted.  There was no tenderness over thoracic or lumbar spine.  Shoulder joints, elbow joints, wrist joints, MCPs PIPs and DIPs were in good range of motion with no synovitis.  Hip joints and knee joints were in good range of motion without any warmth swelling or effusion.  There was no tenderness over ankles or MTPs.  CDAI Exam: CDAI Score: -- Patient Global: --; Provider Global: -- Swollen: --; Tender: -- Joint Exam 12/23/2022   No joint exam has been documented for this visit   There is currently no information documented on the homunculus. Go to the Rheumatology activity and complete the homunculus joint exam.  Investigation: No additional findings.  Imaging: CUP PACEART REMOTE DEVICE CHECK  Result Date: 12/14/2022 Scheduled remote reviewed. Normal device function.  Next remote 91 days. [place], [facility]  ECHOCARDIOGRAM COMPLETE  Result Date: 11/25/2022    ECHOCARDIOGRAM REPORT   Patient Name:   JACORY MCGRANE Date of Exam: 11/25/2022 Medical Rec #:  [id number]           Height:       74.0 in Accession #:    [id number]          Weight:       153.0 lb Date of Birth:  09-03-1934           BSA:          1.940 m Patient Age:    87 years            BP:           126/64 mmHg Patient Gender: M                   HR:

## 2022-12-14 LAB — CUP PACEART REMOTE DEVICE CHECK
Battery Remaining Longevity: 62 mo
Battery Voltage: 2.96 V
Brady Statistic AP VP Percent: 3.21 %
Brady Statistic AP VS Percent: 0 %
Brady Statistic AS VP Percent: 90.32 %
Brady Statistic AS VS Percent: 6.48 %
Brady Statistic RA Percent Paced: 4.41 %
Brady Statistic RV Percent Paced: 93.52 %
Date Time Interrogation Session: 20240902232800
Implantable Lead Connection Status: 753985
Implantable Lead Connection Status: 753985
Implantable Lead Implant Date: 20180827
Implantable Lead Implant Date: 20180827
Implantable Lead Location: 753859
Implantable Lead Location: 753860
Implantable Lead Model: 4076
Implantable Lead Model: 4076
Implantable Pulse Generator Implant Date: 20180827
Lead Channel Impedance Value: 304 Ohm
Lead Channel Impedance Value: 342 Ohm
Lead Channel Impedance Value: 361 Ohm
Lead Channel Impedance Value: 418 Ohm
Lead Channel Pacing Threshold Amplitude: 0.625 V
Lead Channel Pacing Threshold Amplitude: 0.75 V
Lead Channel Pacing Threshold Pulse Width: 0.4 ms
Lead Channel Pacing Threshold Pulse Width: 0.4 ms
Lead Channel Sensing Intrinsic Amplitude: 14.375 mV
Lead Channel Sensing Intrinsic Amplitude: 14.375 mV
Lead Channel Sensing Intrinsic Amplitude: 3.5 mV
Lead Channel Sensing Intrinsic Amplitude: 3.5 mV
Lead Channel Setting Pacing Amplitude: 1.5 V
Lead Channel Setting Pacing Amplitude: 2.5 V
Lead Channel Setting Pacing Pulse Width: 0.4 ms
Lead Channel Setting Sensing Sensitivity: 4 mV
Zone Setting Status: 755011

## 2022-12-15 ENCOUNTER — Encounter: Payer: Self-pay | Admitting: Cardiology

## 2022-12-15 ENCOUNTER — Ambulatory Visit: Payer: Medicare Other | Attending: Cardiology | Admitting: Cardiology

## 2022-12-15 VITALS — BP 134/66 | HR 86 | Ht 73.0 in | Wt 148.8 lb

## 2022-12-15 DIAGNOSIS — I442 Atrioventricular block, complete: Secondary | ICD-10-CM | POA: Insufficient documentation

## 2022-12-15 DIAGNOSIS — I493 Ventricular premature depolarization: Secondary | ICD-10-CM | POA: Insufficient documentation

## 2022-12-15 NOTE — Patient Instructions (Signed)
Medication Instructions:   No changes   *If you need a refill on your cardiac medications before your next appointment, please call your pharmacy*   Lab Work: Not needed   Testing/Procedures: Not needed   Follow-Up: At [facility], you and your health needs are our priority.  As part of our continuing mission to provide you with exceptional heart care, we have created designated Provider Care Teams.  These Care Teams include your primary Cardiologist (physician) and Advanced Practice Providers (APPs -  Physician Assistants and Nurse Practitioners) who all work together to provide you with the care you need, when you need it.     Your next appointment:   12 year(s)  The format for your next appointment:   In Person  Provider:   Rollene Rotunda, MD

## 2022-12-16 ENCOUNTER — Institutional Professional Consult (permissible substitution) (HOSPITAL_BASED_OUTPATIENT_CLINIC_OR_DEPARTMENT_OTHER): Payer: Medicare Other | Admitting: Pulmonary Disease

## 2022-12-22 NOTE — Progress Notes (Signed)
Remote pacemaker transmission.   

## 2022-12-23 ENCOUNTER — Other Ambulatory Visit (HOSPITAL_BASED_OUTPATIENT_CLINIC_OR_DEPARTMENT_OTHER): Payer: Self-pay

## 2022-12-23 ENCOUNTER — Encounter: Payer: Self-pay | Admitting: Rheumatology

## 2022-12-23 ENCOUNTER — Ambulatory Visit: Payer: Medicare Other | Attending: Rheumatology | Admitting: Rheumatology

## 2022-12-23 VITALS — BP 123/74 | HR 82 | Resp 13 | Ht 73.0 in | Wt 148.4 lb

## 2022-12-23 DIAGNOSIS — R5383 Other fatigue: Secondary | ICD-10-CM | POA: Diagnosis present

## 2022-12-23 DIAGNOSIS — Z8546 Personal history of malignant neoplasm of prostate: Secondary | ICD-10-CM

## 2022-12-23 DIAGNOSIS — M51369 Other intervertebral disc degeneration, lumbar region without mention of lumbar back pain or lower extremity pain: Secondary | ICD-10-CM

## 2022-12-23 DIAGNOSIS — M353 Polymyalgia rheumatica: Secondary | ICD-10-CM

## 2022-12-23 DIAGNOSIS — M5030 Other cervical disc degeneration, unspecified cervical region: Secondary | ICD-10-CM | POA: Diagnosis present

## 2022-12-23 DIAGNOSIS — Z8639 Personal history of other endocrine, nutritional and metabolic disease: Secondary | ICD-10-CM | POA: Diagnosis present

## 2022-12-23 DIAGNOSIS — R0602 Shortness of breath: Secondary | ICD-10-CM | POA: Diagnosis present

## 2022-12-23 DIAGNOSIS — R2681 Unsteadiness on feet: Secondary | ICD-10-CM | POA: Diagnosis present

## 2022-12-23 DIAGNOSIS — M5134 Other intervertebral disc degeneration, thoracic region: Secondary | ICD-10-CM | POA: Diagnosis present

## 2022-12-23 DIAGNOSIS — M19042 Primary osteoarthritis, left hand: Secondary | ICD-10-CM | POA: Diagnosis present

## 2022-12-23 DIAGNOSIS — M0609 Rheumatoid arthritis without rheumatoid factor, multiple sites: Secondary | ICD-10-CM

## 2022-12-23 DIAGNOSIS — M8589 Other specified disorders of bone density and structure, multiple sites: Secondary | ICD-10-CM | POA: Diagnosis present

## 2022-12-23 DIAGNOSIS — Z79899 Other long term (current) drug therapy: Secondary | ICD-10-CM

## 2022-12-23 DIAGNOSIS — Z8669 Personal history of other diseases of the nervous system and sense organs: Secondary | ICD-10-CM

## 2022-12-23 DIAGNOSIS — I442 Atrioventricular block, complete: Secondary | ICD-10-CM

## 2022-12-23 DIAGNOSIS — M5136 Other intervertebral disc degeneration, lumbar region: Secondary | ICD-10-CM | POA: Diagnosis present

## 2022-12-23 DIAGNOSIS — M19041 Primary osteoarthritis, right hand: Secondary | ICD-10-CM | POA: Diagnosis present

## 2022-12-23 MED ORDER — CLONAZEPAM 0.5 MG PO TABS
0.5000 mg | ORAL_TABLET | Freq: Every day | ORAL | 0 refills | Status: DC
  Filled 2022-12-23: qty 30, 30d supply, fill #0

## 2022-12-23 NOTE — Patient Instructions (Signed)
Standing Labs We placed an order today for your standing lab work.   Please have your standing labs drawn in October and every 3 months  Please have your labs drawn 2 weeks prior to your appointment so that the provider can discuss your lab results at your appointment, if possible.  Please note that you may see your imaging and lab results in MyChart before we have reviewed them. We will contact you once all results are reviewed. Please allow our office up to 72 hours to thoroughly review all of the results before contacting the office for clarification of your results.  WALK-IN LAB HOURS  Monday through Thursday from 8:00 am -12:30 pm and 1:00 pm-5:00 pm and Friday from 8:00 am-12:00 pm.  Patients with office visits requiring labs will be seen before walk-in labs.  You may encounter longer than normal wait times. Please allow additional time. Wait times may be shorter on  Monday and Thursday afternoons.  We do not book appointments for walk-in labs. We appreciate your patience and understanding with our staff.   Labs are drawn by [facility]. Please bring your co-pay at the time of your lab draw.  You may receive a bill from [facility] for your lab work.  Please note if you are on Hydroxychloroquine and and an order has been placed for a Hydroxychloroquine level,  you will need to have it drawn 4 hours or more after your last dose.  If you wish to have your labs drawn at another location, please call the office 24 hours in advance so we can fax the orders.  The office is located at [address], [place], [place] [address]   If you have any questions regarding directions or hours of operation,  please call [phone].   As a reminder, please drink plenty of water prior to coming for your lab work. Thanks!   Vaccines You are taking a medication(s) that can suppress your immune system.  The following immunizations are recommended: Flu annually Covid-19  RSV Td/Tdap (tetanus,  diphtheria, pertussis) every 10 years Pneumonia (Prevnar 15 then Pneumovax 23 at least 1 year apart.  Alternatively, can take Prevnar 20 without needing additional dose) Shingrix: 2 doses from 4 weeks to 6 months apart  Please check with your PCP to make sure you are up to date.   If you have signs or symptoms of an infection or start antibiotics: First, call your PCP for workup of your infection. Hold your medication through the infection, until you complete your antibiotics, and until symptoms resolve if you take the following: Injectable medication (Actemra, Benlysta, Cimzia, Cosentyx, Enbrel, Humira, Kevzara, Orencia, Remicade, Simponi, Stelara, Taltz, Tremfya) Methotrexate Leflunomide (Arava) Mycophenolate (Cellcept) Harriette Ohara, Olumiant, or Rinvoq

## 2022-12-24 ENCOUNTER — Other Ambulatory Visit (HOSPITAL_BASED_OUTPATIENT_CLINIC_OR_DEPARTMENT_OTHER): Payer: Self-pay

## 2023-01-03 ENCOUNTER — Other Ambulatory Visit: Payer: Self-pay | Admitting: Physician Assistant

## 2023-01-03 DIAGNOSIS — M0609 Rheumatoid arthritis without rheumatoid factor, multiple sites: Secondary | ICD-10-CM

## 2023-01-03 DIAGNOSIS — Z79899 Other long term (current) drug therapy: Secondary | ICD-10-CM

## 2023-01-04 ENCOUNTER — Other Ambulatory Visit: Payer: Self-pay

## 2023-01-04 ENCOUNTER — Ambulatory Visit: Payer: Medicare Other | Admitting: Rheumatology

## 2023-01-04 ENCOUNTER — Encounter: Payer: Medicare Other | Admitting: Neurology

## 2023-01-04 ENCOUNTER — Other Ambulatory Visit (HOSPITAL_BASED_OUTPATIENT_CLINIC_OR_DEPARTMENT_OTHER): Payer: Self-pay

## 2023-01-04 MED ORDER — METHOTREXATE SODIUM 2.5 MG PO TABS
15.0000 mg | ORAL_TABLET | ORAL | 0 refills | Status: DC
Start: 2023-01-04 — End: 2023-03-24
  Filled 2023-01-04: qty 72, 84d supply, fill #0

## 2023-01-04 NOTE — Telephone Encounter (Signed)
Last Fill: 07/04/2022  Labs: 10/27/2022 MPV 9.1, Sodium 132, BUN 22, Creat. 0.70 BUN/Creatinine Ratio 31, Glucose 140, AST 14  Next Visit: 05/25/2023  Last Visit: 12/23/2022  DX: Rheumatoid arthritis of multiple sites without rheumatoid factor   Current Dose per office note 12/23/2022: MTX 6 tabs po once weekly   Okay to refill Methotrexate?

## 2023-01-11 ENCOUNTER — Ambulatory Visit: Payer: Medicare Other | Admitting: Neurology

## 2023-01-11 ENCOUNTER — Ambulatory Visit (INDEPENDENT_AMBULATORY_CARE_PROVIDER_SITE_OTHER): Payer: Medicare Other | Admitting: Neurology

## 2023-01-11 DIAGNOSIS — I442 Atrioventricular block, complete: Secondary | ICD-10-CM

## 2023-01-11 DIAGNOSIS — G629 Polyneuropathy, unspecified: Secondary | ICD-10-CM | POA: Diagnosis not present

## 2023-01-11 DIAGNOSIS — G4731 Primary central sleep apnea: Secondary | ICD-10-CM

## 2023-01-11 DIAGNOSIS — R490 Dysphonia: Secondary | ICD-10-CM | POA: Diagnosis not present

## 2023-01-11 DIAGNOSIS — M0609 Rheumatoid arthritis without rheumatoid factor, multiple sites: Secondary | ICD-10-CM | POA: Diagnosis not present

## 2023-01-11 DIAGNOSIS — R269 Unspecified abnormalities of gait and mobility: Secondary | ICD-10-CM

## 2023-01-11 DIAGNOSIS — R1312 Dysphagia, oropharyngeal phase: Secondary | ICD-10-CM

## 2023-01-11 NOTE — Procedures (Signed)
Full Name: Jeffery [name] Gender: Male MRN #: [id number] Date of Birth: 1934/04/22    Visit Date: 01/11/2023 13:32 Age: 88 Years Examining Physician: Dr. Levert Feinstein Referring Physician: Dr. Vickey Huger Height: 6 feet 2 inch History: 88 year old male, with history of lumbar decompression surgery, presenting with subacute worsening of gait abnormality since June 2024  Summary of the test:  Nerve conduction study: Left sural, superficial peroneal sensory responses were absent.  Left median, ulnar sensory response showed moderately prolonged peak latency with moderately decreased snap amplitude.  Left radial sensory response showed normal peak latency with moderately decreased snap amplitude.  Left peroneal to EDB motor responses were absent.  Left tibial motor responses showed significantly decreased CMAP amplitude.  Left median, ulnar motor responses were normal  Electromyography: Selected needle examination of bilateral lower extremity muscles, lumbosacral paraspinal muscles; right upper extremity muscles, cervical paraspinal muscles, right sternocleidomastoid, and right orbicularis oculi muscles were performed  There is evidence of chronic neuropathic changes involving bilateral lumbosacral paraspinal muscles, left worse than right, L4, 5, more than S1,  There was no spontaneous activity at bilateral lumbosacral paraspinal muscles.  There was no significant abnormalities at selected needle examination of right upper extremity muscles, cervical paraspinal muscles, sternocleidomastoid and right orbicularis oculi.  Conclusion: This is an abnormal study.  There is electrodiagnostic evidence of moderate axonal sensorimotor polyneuropathy, with superimposed chronic bilateral lumbosacral radiculopathy, left worse than right, mainly involving bilateral L4-5 S1.  There is no evidence of intrinsic muscle disease, or active cervical  radiculopathy.    ------------------------------- Forrestine Him.D.Ph.D.  [facility] [address] [place], [place] [address] Tel: [phone] Fax: [phone]  Verbal informed consent was obtained from the patient, patient was informed of potential risk of procedure, including bruising, bleeding, hematoma formation, infection, muscle weakness, muscle pain, numbness, among others.        MNC    Nerve / Sites Muscle Latency Ref. Amplitude Ref. Rel Amp Segments Distance Velocity Ref. Area    ms ms mV mV %  cm m/s m/s mVms  L Median - APB     Wrist APB 4.2 <=4.4 5.6 >=4.0 100 Wrist - APB 7   20.3     Upper arm APB 9.5  5.3  93.7 Upper arm - Wrist 28 53 >=49 20.3  L Ulnar - ADM     Wrist ADM 3.0 <=3.3 7.5 >=6.0 100 Wrist - ADM 7   24.9     B.Elbow ADM 5.6  7.1  94.7 B.Elbow - Wrist 13 49 >=49 25.0     A.Elbow ADM 9.3  6.4  89.9 A.Elbow - B.Elbow 17 47 >=49 23.9  L Peroneal - EDB     Ankle EDB NR <=6.5 NR >=2.0 NR Ankle - EDB 9   NR         Pop fossa - Ankle      L Tibial - AH     Ankle AH 4.7 <=5.8 1.7 >=4.0 100 Ankle - AH 9   5.1     Pop fossa AH 15.4  1.3  74 Pop fossa - Ankle 37 35 >=41 3.6             SNC    Nerve / Sites Rec. Site Peak Lat Ref.  Amp Ref. Segments Distance    ms ms V V  cm  L Radial - Anatomical snuff box (Forearm)     Forearm Wrist 2.8 <=2.9 8 >=15 Forearm -  Wrist 10  L Sural - Ankle (Calf)     Calf Ankle NR <=4.4 NR >=6 Calf - Ankle 14  L Superficial peroneal - Ankle     Lat leg Ankle NR <=4.4 NR >=6 Lat leg - Ankle 14  L Median - Orthodromic (Dig II, Mid palm)     Dig II Wrist 4.1 <=3.4 7 >=10 Dig II - Wrist 13  L Ulnar - Orthodromic, (Dig V, Mid palm)     Dig V Wrist 3.9 <=3.1 3 >=5 Dig V - Wrist 78               F  Wave    Nerve F Lat Ref.   ms ms  L Tibial - AH 64.7 <=56.0  L Ulnar - ADM 32.3 <=32.0         EMG Summary Table    Spontaneous MUAP Recruitment  Muscle IA Fib PSW Fasc Other Amp Dur. Poly Pattern   R. Tibialis anterior Increased 1+ 1+ None _______ Increased Increased 1+ Reduced  R. Tibialis posterior Increased None None None _______ Increased Increased 1+ Reduced  R. Peroneus longus Increased None None None _______ Increased Increased 1+ Reduced  R. Gastrocnemius (Medial head) Increased None None None _______ Increased Increased 1+ Reduced  R. Vastus lateralis Normal None None None _______ Increased Increased 1+ Reduced  R. Biceps femoris (long head) Normal None None None _______ Normal Normal Normal Reduced  R. Gluteus medius Normal None None None _______ Normal Normal Normal Normal  R. Lumbar paraspinals (low) Increased 1+ None None _______ Normal Normal Normal Normal  R. Lumbar paraspinals (mid) Increased 1+ None None _______ Normal Normal Normal Normal  L. Tibialis anterior Normal None None None _______ Normal Normal Normal Reduced  L. Tibialis posterior Increased 1+ None None _______ Normal Normal Normal Reduced  L. Peroneus longus Increased None None None _______ Increased Increased 1+ Reduced  L. Gastrocnemius (Medial head) Increased None None None _______ Increased Increased 1+ Reduced  L. Vastus lateralis Normal None None None _______ Increased Increased 1+ Reduced  L. Lumbar paraspinals (low) Increased 1+ None None _______ Normal Normal Normal Normal  L. Lumbar paraspinals (mid) Normal None None None _______ Normal Normal Normal Normal  R. First dorsal interosseous Normal None None None _______ Normal Normal Normal Normal  R. Pronator teres Normal None None None _______ Normal Normal Normal Normal  R. Deltoid Normal None None None _______ Normal Normal Normal Normal  R. Biceps brachii Normal None None None _______ Normal Normal Normal Normal  R. Triceps brachii Normal None None None _______ Normal Normal Normal Normal  R. Brachioradialis Normal None None None _______ Normal Normal Normal Normal  R. Cervical paraspinals Normal None None None _______ Normal Normal Normal Normal   R. Orbicularis oculi Normal None None None _______ Normal Normal Normal Normal  R. Sternocleidomastoid Normal None None None _______ Normal Normal Normal Normal

## 2023-01-11 NOTE — Progress Notes (Addendum)
ASSESSMENT AND PLAN  [name] is a 88 y.o. male   Known history of lumbar and cervical degenerative disease, Acute worsening of gait abnormality since June 2024,  Examination showed length-dependent sensory changes, distal weakness, left worse than right, bilateral foot drop, stiff unsteady gait,    EMG nerve conduction study today demonstrate length-dependent moderate axonal sensorimotor polyneuropathy, with superimposed chronic bilateral lumbosacral radiculopathy, left worse than right,  His gait abnormality are multifactorial,   Most worrisome for worsening lumbar stenosis, MRI of lumbar,  MRI cervical to rule out superimposed cervical spondylitic myelopathy  Prescription of left AFO for left foot drop    DIAGNOSTIC DATA (LABS, IMAGING, TESTING) - I reviewed patient records, labs, notes, testing and imaging myself where available. Addendum reviewed neurosurgeon Dr. Jillyn Hidden cram evaluation from May 09, 2023, severe spinal stenosis L4-5, grade 1 spondylolisthesis at this level as well, Dr. Wynetta Emery noticed that he has proximal leg weakness that would be unusual for that, this can be related to his arthritis, suggest ongoing physical therapy, epidural injection, Neurontin low-dose, hesitate to pursue surgical intervention at 88 year old,  MEDICAL HISTORY:  Jeffery [name] is a 88 year old male, seen in request by Dr. Vickey Huger for evaluation of subacute worsening gait abnormality, he is accompanied by his wife at today's electrodiagnostic study January 11, 2023, his primary care physician is [name], [facility].  History is obtained from the patient and review of electronic medical records. I personally reviewed pertinent available imaging films in PACS.   PMHx of  Lumbar decompression surgery Rheumatoid arthritis of multiple sites without rheumatoid factor, taking Plaquenil Polymyalgia rheumatica, under the care of of [name], with normal CPK 62, ESR of 29 which is mildly  elevated, History of pacemaker, due to AV block Diabetes,  He has gradual onset mild slurred speech, slight swallowing difficulty since 2022, very slow progress,  He has mild gait abnormality, had lumbar decompression surgery in the past, was able to ambulate at his baseline, in July 2024, he had acute worsening of his walking difficulty, rely on his walker, denies significant low back pain, no bowel and bladder incontinence, lower extremity sensory loss, no worsening of his swallowing difficulty or dysarthria  He had extensive evaluation to rule out neuromuscular junctional disorder or intrinsic muscle disease, myasthenia gravis panel was negative, musk antibody negative, A1c 6.6, normal CPK  MRI of the brain with and without contrast December 2023 generalized atrophy, mild small vessel disease, evidence of right maxillary sinusitis  PHYSICAL EXAM:      01/13/2023   12:29 PM 12/23/2022   11:12 AM 12/15/2022    8:11 AM  Vitals with BMI  Height 6\' 1"  6\' 1"  6\' 1"   Weight 152 lbs 148 lbs 6 oz 148 lbs 13 oz  BMI 20.06 19.58 19.64  Systolic 130 123 119  Diastolic 70 74 66  Pulse 90 82 86     PHYSICAL EXAMNIATION:  Gen: NAD, conversant, well nourised, well groomed                     Cardiovascular: Regular rate rhythm, no peripheral edema, warm, nontender. Eyes: Conjunctivae clear without exudates or hemorrhage Neck: Supple, no carotid bruits. Pulmonary: Clear to auscultation bilaterally   NEUROLOGICAL EXAM:  MENTAL STATUS: Speech/cognition: Awake, alert, oriented to history taking and casual conversation CRANIAL NERVES: CN II: Visual fields are full to confrontation. Pupils are round equal and briskly reactive to light.visual acuity OD 20/50, OS 20/200 CN III, IV,  VI:  No ptosis.  Cover and uncover testing demonstrated left superior exophoria, right inferior exophoria, right eye with dominant eye,  CN V: Facial sensation is intact to light touch CN VII: Face is symmetric with  normal eye closure  CN VIII: Hearing is normal to causal conversation. CN IX, X: Phonation is normal. CN XI: Head turning and shoulder shrug are intact CN XII: Slow spastic tongue movement no tongue atrophy, or fasciculations.  MOTOR: Upper extremity motor strength is normal, no significant proximal lower extremity muscle weakness, moderate bilateral ankle dorsiflexion weakness left worse than right,  REFLEXES: Areflexia  SENSORY: Length-dependent decreased light touch, pinprick, vibratory sensation to mid shin level,  COORDINATION: There is no trunk or limb dysmetria noted.  GAIT/STANCE: Need push-up to get up from seated position, wide-based, bilateral foot drop, left worse than right,  REVIEW OF SYSTEMS:  Full 14 system review of systems performed and notable only for as above All other review of systems were negative.   ALLERGIES: Allergies  Allergen Reactions   Sulfasalazine Anaphylaxis   Demerol [Meperidine] Nausea Only   Garlic    Other     SHALLOTS   Sulfa Antibiotics     HOME MEDICATIONS: Current Outpatient Medications  Medication Sig Dispense Refill   acetaminophen (TYLENOL) 500 MG tablet Take 500 mg by mouth every 6 (six) hours as needed.     cholecalciferol (VITAMIN D3) 25 MCG (1000 UNIT) tablet Take 1,000 Units by mouth daily.     clonazePAM (KLONOPIN) 0.5 MG tablet Take 1 tablet (0.5 mg total) by mouth at bedtime as needed as directed. 30 tablet 0   diclofenac Sodium (VOLTAREN) 1 % GEL Apply 2-4 grams to affected joint 4 times daily as needed. 400 g 2   folic acid (FOLVITE) 1 MG tablet Take 1 tablet (1 mg total) by mouth daily. 90 tablet 3   glucose blood (ONETOUCH ULTRA TEST) test strip Check blood sugar 2 times daily 200 each 3   hydroxychloroquine (PLAQUENIL) 200 MG tablet Take 1 tablet (200 mg total) by mouth 2 (two) times daily. 180 tablet 0   Lancets (ONETOUCH DELICA PLUS LANCET33G) MISC Check blood sugar two times a day. 200 each 3   metFORMIN  (GLUCOPHAGE) 500 MG tablet Take 500 mg by mouth 2 (two) times daily with a meal.     methotrexate (RHEUMATREX) 2.5 MG tablet Take 6 tablets (15 mg total) by mouth once a week. 72 tablet 0   ONE TOUCH ULTRA TEST test strip TEST BLOOD SUGAR BID AS DIRECTED  2   pyridostigmine (MESTINON) 60 MG tablet Take 1 tablet (60 mg total) by mouth once as needed for up to 1 dose. Bring the medication to office and it will be administered with MD to monitor how you tolerate the medication in the office. (Patient not taking: Reported on 12/15/2022) 5 tablet 0   TUBERCULIN SYR 1CC/27GX1/2" (B-D TB SYRINGE 1CC/27GX1/2") 27G X 1/2" 1 ML MISC USE TO INJECT METHOTREXATE (Patient not taking: Reported on 11/07/2022) 12 each 3   vitamin E 100 UNIT capsule Take 100 Units by mouth daily.     zolpidem (AMBIEN) 5 MG tablet Take 5 mg by mouth as needed.     No current facility-administered medications for this visit.    PAST MEDICAL HISTORY: Past Medical History:  Diagnosis Date   Arthritis    Autoimmune disorder (HCC)    BCC (basal cell carcinoma)    BPH (benign prostatic hypertrophy) 2008   Cancer [facility]) 2008  Prostate   Cervical stenosis of spine    per patient    Complete heart block (HCC)    Complex sleep apnea syndrome    does not use CPAP   Diabetes mellitus without complication (HCC)    Diverticulosis    ED (erectile dysfunction)    Gastritis    GERD (gastroesophageal reflux disease)    Hemorrhoids    Hernia, inguinal    Hyperlipidemia    IFG (impaired fasting glucose)    Osteopenia    Peptic ulcer disease with hemorrhage 1993   Shingles    Sleep apnea    Vitamin D deficiency     PAST SURGICAL HISTORY: Past Surgical History:  Procedure Laterality Date   BACK SURGERY N/A 1984   barium swallow study  2023   x2   CATARACT EXTRACTION, BILATERAL     HERNIA REPAIR     LAMINECTOMY  1984   L 5   PACEMAKER IMPLANT  12/05/2016   MDT Azure XT DR MRI conditional PPM implanted for CHB by Dr Barnie Mort in  [place]   PACEMAKER INSERTION  12/05/2016   PROSTATE SURGERY  12/2006   Robotic   TONSILLECTOMY  88 years old   TRIGGER FINGER RELEASE  03/01/2012   Procedure: MINOR RELEASE TRIGGER FINGER/A-1 PULLEY;  Surgeon: [name], MD;  Location: [facility];  Service: Orthopedics;  Laterality: Left;  long finger   TRIGGER FINGER RELEASE Left 02/08/2018   Procedure: RELEASE TRIGGER FINGER/A-1 PULLEY LEFT RING FINGER;  Surgeon: Cindee Salt, MD;  Location: [facility];  Service: Orthopedics;  Laterality: Left;   UPPER GI ENDOSCOPY  2023    FAMILY HISTORY: Family History  Problem Relation Age of Onset   Heart failure Mother        Died age 52.  Apparently had valve surgery.    Congenital heart disease Brother    Stroke Maternal Grandfather    Colon cancer Neg Hx    Stomach cancer Neg Hx    Esophageal cancer Neg Hx    Rectal cancer Neg Hx     SOCIAL HISTORY: Social History   Socioeconomic History   Marital status: Married    Spouse name: Not on file   Number of children: 2   Years of education: Not on file   Highest education level: Not on file  Occupational History   Not on file  Tobacco Use   Smoking status: Never    Passive exposure: Never   Smokeless tobacco: Never  Vaping Use   Vaping status: Never Used  Substance and Sexual Activity   Alcohol use: Not Currently   Drug use: No   Sexual activity: Not on file  Other Topics Concern   Not on file  Social History Narrative   Right handed, Retired .Married, 2 kids. Caffeine none.   3 Step    Social Determinants of Health   Financial Resource Strain: Not on file  Food Insecurity: Not on file  Transportation Needs: Not on file  Physical Activity: Not on file  Stress: Not on file  Social Connections: Unknown (10/28/2022)   [facility] from [facility]   Social Network    Social Network: Not on file  Intimate Partner Violence: Unknown (10/28/2022)   Received from [facility]   HITS     Physically Hurt: Not on file    Insult or Talk Down To: Not on file    Threaten Physical Harm: Not on file    Scream  or Curse: Not on file      Levert Feinstein, M.D. Ph.D.  [facility] [address] [place], [place] [address] Ph: [phone] Fax: [phone]  CC:  Mahlon Gammon, MD [address] [place],  [place] [address]  Creola Corn, MD

## 2023-01-12 ENCOUNTER — Telehealth: Payer: Self-pay | Admitting: Neurology

## 2023-01-12 NOTE — Telephone Encounter (Signed)
medicare/BCBS sup NPR sent to [place] [phone] 

## 2023-01-13 ENCOUNTER — Encounter: Payer: Self-pay | Admitting: Student

## 2023-01-13 ENCOUNTER — Ambulatory Visit: Payer: Medicare Other | Attending: Physician Assistant | Admitting: Student

## 2023-01-13 VITALS — BP 130/70 | HR 90 | Ht 73.0 in | Wt 152.0 lb

## 2023-01-13 DIAGNOSIS — I493 Ventricular premature depolarization: Secondary | ICD-10-CM | POA: Diagnosis present

## 2023-01-13 DIAGNOSIS — I10 Essential (primary) hypertension: Secondary | ICD-10-CM | POA: Diagnosis present

## 2023-01-13 DIAGNOSIS — I442 Atrioventricular block, complete: Secondary | ICD-10-CM | POA: Insufficient documentation

## 2023-01-13 LAB — CUP PACEART INCLINIC DEVICE CHECK
Battery Remaining Longevity: 62 mo
Battery Voltage: 2.96 V
Brady Statistic AP VP Percent: 2.86 %
Brady Statistic AP VS Percent: 0 %
Brady Statistic AS VP Percent: 92.8 %
Brady Statistic AS VS Percent: 4.34 %
Brady Statistic RA Percent Paced: 3.68 %
Brady Statistic RV Percent Paced: 95.66 %
Date Time Interrogation Session: 20241004130712
Implantable Lead Connection Status: 753985
Implantable Lead Connection Status: 753985
Implantable Lead Implant Date: 20180827
Implantable Lead Implant Date: 20180827
Implantable Lead Location: 753859
Implantable Lead Location: 753860
Implantable Lead Model: 4076
Implantable Lead Model: 4076
Implantable Pulse Generator Implant Date: 20180827
Lead Channel Impedance Value: 323 Ohm
Lead Channel Impedance Value: 380 Ohm
Lead Channel Impedance Value: 380 Ohm
Lead Channel Impedance Value: 456 Ohm
Lead Channel Pacing Threshold Amplitude: 0.625 V
Lead Channel Pacing Threshold Amplitude: 0.875 V
Lead Channel Pacing Threshold Pulse Width: 0.4 ms
Lead Channel Pacing Threshold Pulse Width: 0.4 ms
Lead Channel Sensing Intrinsic Amplitude: 13.375 mV
Lead Channel Sensing Intrinsic Amplitude: 13.375 mV
Lead Channel Sensing Intrinsic Amplitude: 4.25 mV
Lead Channel Sensing Intrinsic Amplitude: 4.875 mV
Lead Channel Setting Pacing Amplitude: 1.5 V
Lead Channel Setting Pacing Amplitude: 2.5 V
Lead Channel Setting Pacing Pulse Width: 0.4 ms
Lead Channel Setting Sensing Sensitivity: 4 mV
Zone Setting Status: 755011

## 2023-01-13 NOTE — Progress Notes (Signed)
Electrophysiology Office Note:   ID:  Jeffery [name], Segner 1934/09/10, MRN [id number]  Primary Cardiologist: Rollene Rotunda, MD Electrophysiologist: Lewayne Bunting, MD      History of Present Illness:   Jeffery [name] is a 88 y.o. male with h/o PVCs, CHB s/p PPM, HTN seen today for routine electrophysiology followup.   Since last being seen in our clinic the patient reports doing OK. Through the summer he has had progressive, and now waning, fatigue and lower extremity weakness. Work up thus far has been unrevealing and he is somewhat frustrated at that fact. Wanted a full device check to make sure nothing contributing.  Rare PVCs noted, with increase in the summer;   Review of systems complete and found to be negative unless listed in HPI.   EP Information / Studies Reviewed:    EKG is not ordered today. EKG from 08/23/2022 reviewed which showed AS VP rhythm with frequent PVCs    PPM Interrogation-  reviewed in detail today,  See PACEART report.  Device History: Medtronic Dual Chamber PPM implanted 11/2016 for CHB  Physical Exam:   VS:  BP 130/70   Pulse 90   Ht 6\' 1"  (1.854 m)   Wt 152 lb (68.9 kg)   SpO2 98%   BMI 20.05 kg/m    Wt Readings from Last 3 Encounters:  01/13/23 152 lb (68.9 kg)  12/23/22 148 lb 6.4 oz (67.3 kg)  12/15/22 148 lb 12.8 oz (67.5 kg)     GEN: Well nourished, well developed in no acute distress NECK: No JVD; No carotid bruits CARDIAC: Regular rate and rhythm, no murmurs, rubs, gallops RESPIRATORY:  Clear to auscultation without rales, wheezing or rhonchi  ABDOMEN: Soft, non-tender, non-distended EXTREMITIES:  No edema; No deformity   ASSESSMENT AND PLAN:    CHB s/p Medtronic PPM  Normal PPM function See Arita Miss Art report No changes today  PVCs ~22-25 a minute Encouraged pt that increase during the summer months, and PVC burden can be increased by stress, illness, dehydration, change in activity level, etc.  Encouraged that even at  his highest counters (>60 per hour), Having PVCs that rarely is not dangerous, and unlikely to have been contributing to his muscular symptoms.   HTN Stable on current regimen   Disposition:   Follow up with Dr. Ladona Ridgel in 6 months per recall at pts preference.   Signed, Graciella Freer, PA-C

## 2023-01-13 NOTE — Patient Instructions (Signed)
Medication Instructions:  Your physician recommends that you continue on your current medications as directed. Please refer to the Current Medication list given to you today.  *If you need a refill on your cardiac medications before your next appointment, please call your pharmacy*  Lab Work: None ordered If you have labs (blood work) drawn today and your tests are completely normal, you will receive your results only by: MyChart Message (if you have MyChart) OR A paper copy in the mail If you have any lab test that is abnormal or we need to change your treatment, we will call you to review the results.  Follow-Up: At [facility], you and your health needs are our priority.  As part of our continuing mission to provide you with exceptional heart care, we have created designated Provider Care Teams.  These Care Teams include your primary Cardiologist (physician) and Advanced Practice Providers (APPs -  Physician Assistants and Nurse Practitioners) who all work together to provide you with the care you need, when you need it.  Your next appointment:   May 2025  Provider:   Lewayne Bunting, MD

## 2023-01-14 ENCOUNTER — Other Ambulatory Visit (HOSPITAL_BASED_OUTPATIENT_CLINIC_OR_DEPARTMENT_OTHER): Payer: Self-pay

## 2023-01-16 ENCOUNTER — Ambulatory Visit (HOSPITAL_BASED_OUTPATIENT_CLINIC_OR_DEPARTMENT_OTHER)
Admission: RE | Admit: 2023-01-16 | Discharge: 2023-01-16 | Disposition: A | Discharge status: Home / Self Care | Payer: Medicare Other | Source: Ambulatory Visit | Attending: Rheumatology | Admitting: Rheumatology

## 2023-01-16 DIAGNOSIS — M8589 Other specified disorders of bone density and structure, multiple sites: Secondary | ICD-10-CM | POA: Insufficient documentation

## 2023-01-17 ENCOUNTER — Telehealth: Payer: Self-pay

## 2023-01-17 NOTE — Progress Notes (Signed)
I left a message on the answering machine for patient to call back.  DEXA scan is consistent with osteoporosis.  We can start on Fosamax after discussing the side effects at the follow-up visit.  If patient wants to start on Fosamax sooner than we can try to schedule an earlier appointment.  He should take calcium and vitamin D on a regular basis.

## 2023-01-17 NOTE — Telephone Encounter (Signed)
I left the patient a voicemail detailing results (as per DPR). I informed him of the diagnosis based on the NCV/EMG findings by Dr. Vickey Huger. I provided the recommendations to take Alpha Lipoic fatty acid and coenzyme Q 10 to stall neuropathy. The office number was left for a return call, if needed.

## 2023-01-17 NOTE — Telephone Encounter (Signed)
-----   Message from Ames Dohmeier sent at 01/16/2023 12:58 PM EDT ----- abnormal study.  There is electrodiagnostic evidence of moderate axonal sensorimotor polyneuropathy ( the most common form of neuropathy) , with superimposed chronic bilateral lumbosacral radiculopathy, left worse than right, mainly involving bilateral L4-5 -S1.   There is no evidence of intrinsic muscle disease, or active cervical radiculopathy. That means no evidence of a degenerative process.  Alpha lipoic fatty acid as a supplement and coenzyme Q 10 supplements can be used to stall neuropathy.

## 2023-01-19 ENCOUNTER — Other Ambulatory Visit (HOSPITAL_BASED_OUTPATIENT_CLINIC_OR_DEPARTMENT_OTHER): Payer: Self-pay

## 2023-01-19 ENCOUNTER — Other Ambulatory Visit: Payer: Self-pay | Admitting: *Deleted

## 2023-01-19 DIAGNOSIS — Z79899 Other long term (current) drug therapy: Secondary | ICD-10-CM

## 2023-01-19 MED ORDER — METFORMIN HCL 500 MG PO TABS
500.0000 mg | ORAL_TABLET | Freq: Every day | ORAL | 4 refills | Status: DC
  Filled 2023-01-19: qty 90, 90d supply, fill #0
  Filled 2023-04-20: qty 90, 90d supply, fill #1
  Filled 2023-07-15: qty 90, 90d supply, fill #2
  Filled 2023-10-12: qty 90, 90d supply, fill #3
  Filled 2024-01-07: qty 90, 90d supply, fill #4

## 2023-01-19 MED ORDER — METFORMIN HCL 500 MG PO TABS
500.0000 mg | ORAL_TABLET | Freq: Every day | ORAL | 4 refills | Status: DC
  Filled 2023-01-19: qty 90, 90d supply, fill #0

## 2023-01-20 LAB — CBC WITH DIFFERENTIAL/PLATELET
Absolute Monocytes: 437 {cells}/uL (ref 200–950)
Basophils Absolute: 9 {cells}/uL (ref 0–200)
Basophils Relative: 0.2 %
Eosinophils Absolute: 61 {cells}/uL (ref 15–500)
Eosinophils Relative: 1.3 %
HCT: 43.8 % (ref 38.5–50.0)
Hemoglobin: 14.2 g/dL (ref 13.2–17.1)
Lymphs Abs: 1429 {cells}/uL (ref 850–3900)
MCH: 31.4 pg (ref 27.0–33.0)
MCHC: 32.4 g/dL (ref 32.0–36.0)
MCV: 96.9 fL (ref 80.0–100.0)
MPV: 9.5 fL (ref 7.5–12.5)
Monocytes Relative: 9.3 %
Neutro Abs: 2764 {cells}/uL (ref 1500–7800)
Neutrophils Relative %: 58.8 %
Platelets: 254 10*3/uL (ref 140–400)
RBC: 4.52 10*6/uL (ref 4.20–5.80)
RDW: 13.3 % (ref 11.0–15.0)
Total Lymphocyte: 30.4 %
WBC: 4.7 10*3/uL (ref 3.8–10.8)

## 2023-01-20 LAB — COMPLETE METABOLIC PANEL WITH GFR
AG Ratio: 1.7 (calc) (ref 1.0–2.5)
ALT: 13 U/L (ref 9–46)
AST: 7 U/L — ABNORMAL LOW (ref 10–35)
Albumin: 4.4 g/dL (ref 3.6–5.1)
Alkaline phosphatase (APISO): 73 U/L (ref 35–144)
BUN: 15 mg/dL (ref 7–25)
CO2: 35 mmol/L — ABNORMAL HIGH (ref 20–32)
Calcium: 9.8 mg/dL (ref 8.6–10.3)
Chloride: 98 mmol/L (ref 98–110)
Creat: 0.73 mg/dL (ref 0.70–1.22)
Globulin: 2.6 g/dL (ref 1.9–3.7)
Glucose, Bld: 120 mg/dL — ABNORMAL HIGH (ref 65–99)
Potassium: 4.9 mmol/L (ref 3.5–5.3)
Sodium: 141 mmol/L (ref 135–146)
Total Bilirubin: 0.6 mg/dL (ref 0.2–1.2)
Total Protein: 7 g/dL (ref 6.1–8.1)
eGFR: 89 mL/min/{1.73_m2} (ref >=60)

## 2023-01-20 NOTE — Progress Notes (Signed)
CBC is normal.  CMP shows mildly elevated glucose probably not a fasting sample.

## 2023-01-22 ENCOUNTER — Other Ambulatory Visit (HOSPITAL_BASED_OUTPATIENT_CLINIC_OR_DEPARTMENT_OTHER): Payer: Self-pay

## 2023-01-23 ENCOUNTER — Encounter (HOSPITAL_BASED_OUTPATIENT_CLINIC_OR_DEPARTMENT_OTHER): Payer: Self-pay | Admitting: Pulmonary Disease

## 2023-01-23 ENCOUNTER — Other Ambulatory Visit (HOSPITAL_BASED_OUTPATIENT_CLINIC_OR_DEPARTMENT_OTHER): Payer: Self-pay

## 2023-01-23 ENCOUNTER — Ambulatory Visit (HOSPITAL_BASED_OUTPATIENT_CLINIC_OR_DEPARTMENT_OTHER): Payer: Medicare Other | Admitting: Pulmonary Disease

## 2023-01-23 VITALS — BP 94/54 | HR 88 | Resp 16 | Ht 73.0 in | Wt 151.0 lb

## 2023-01-23 DIAGNOSIS — R0602 Shortness of breath: Secondary | ICD-10-CM | POA: Insufficient documentation

## 2023-01-23 MED ORDER — CLONAZEPAM 0.5 MG PO TABS
0.5000 mg | ORAL_TABLET | Freq: Every evening | ORAL | 2 refills | Status: DC | PRN
  Filled 2023-01-23: qty 30, 30d supply, fill #0
  Filled 2023-02-23: qty 30, 30d supply, fill #1
  Filled 2023-03-25 (×2): qty 30, 30d supply, fill #2

## 2023-01-23 NOTE — Patient Instructions (Signed)
Shortness of breath  May be secondary to deconditioning +/- underlying restriction  --ORDER pulmonary function prior to next visit --REFER to pulmonary rehab

## 2023-01-23 NOTE — Progress Notes (Signed)
Subjective:   PATIENT ID: Jeffery [name] GENDER: male DOB: 1935/03/29, MRN: [id number]  Chief Complaint  Patient presents with   Consult    SOB-SOB and general fatigue. X 6 months, states that it is getting better. SOB extreme fatigue debilitating last June-has been to neuro, cardio, rheum and no answers, lots of imaging and labs. Now to see pulm--not used CPAP in a couple months    Reason for Visit: generalized fatigue new consult  [name] is an 88 year old male with past medical history of seronegative rheumatoid arthritis, complete heart block s/p pacemaker implamantation, who presents with a four month history of excessive daytime fatigue. He states that in the summer (June 2024), he had worsening shortness of breath on exertion, and was told he had multiple bouts of COVID (however no evidence of this). The shortness of breath has resolved, however the fatigue has not. Previously, before he got sick in June, he was very active and did not need any walking aid. Since then, he has needed to use a cane, which progressed to walker, which has now progressed to rollater. He has had quite an extensive workup involving multiple disciplinary teams, including neuro, rheumatology, cardiology, however no cause of fatigue has been able to be addressed. Of note, he does have a history of sleep apnea for which he has not been using his CPAP (coincidentally at the same time of symptom onset).     Social History: Remote history of cigarette use  Environmental exposures: NA  I have personally reviewed patient's past medical/family/social history, allergies, current medications.  Past Medical History:  Diagnosis Date   Arthritis    Autoimmune disorder (HCC)    BCC (basal cell carcinoma)    BPH (benign prostatic hypertrophy) 2008   Cancer [facility]) 2008   Prostate   Cervical stenosis of spine    per patient    Complete heart block (HCC)    Complex sleep apnea syndrome    does not use  CPAP   Diabetes mellitus without complication (HCC)    Diverticulosis    ED (erectile dysfunction)    Gastritis    GERD (gastroesophageal reflux disease)    Hemorrhoids    Hernia, inguinal    Hyperlipidemia    IFG (impaired fasting glucose)    Osteopenia    Peptic ulcer disease with hemorrhage 1993   Shingles    Sleep apnea    Vitamin D deficiency      Family History  Problem Relation Age of Onset   Heart failure Mother        Died age 47.  Apparently had valve surgery.    Congenital heart disease Brother    Stroke Maternal Grandfather    Colon cancer Neg Hx    Stomach cancer Neg Hx    Esophageal cancer Neg Hx    Rectal cancer Neg Hx      Social History   Occupational History   Not on file  Tobacco Use   Smoking status: Never    Passive exposure: Never   Smokeless tobacco: Never  Vaping Use   Vaping status: Never Used  Substance and Sexual Activity   Alcohol use: Not Currently   Drug use: No   Sexual activity: Not on file    Allergies  Allergen Reactions   Sulfasalazine Anaphylaxis   Demerol [Meperidine] Nausea Only   Garlic    Other     SHALLOTS   Sulfa Antibiotics  Outpatient Medications Prior to Visit  Medication Sig Dispense Refill   acetaminophen (TYLENOL) 500 MG tablet Take 500 mg by mouth every 6 (six) hours as needed.     calcium carbonate (OS-CAL) 1250 (500 Ca) MG chewable tablet Chew 1 tablet by mouth daily.     cholecalciferol (VITAMIN D3) 25 MCG (1000 UNIT) tablet Take 1,000 Units by mouth daily.     clonazePAM (KLONOPIN) 0.5 MG tablet Take 1 tablet (0.5 mg total) by mouth at bedtime as needed as directed. 30 tablet 0   diclofenac Sodium (VOLTAREN) 1 % GEL Apply 2-4 grams to affected joint 4 times daily as needed. 400 g 2   folic acid (FOLVITE) 1 MG tablet Take 1 tablet (1 mg total) by mouth daily. 90 tablet 3   glucose blood (ONETOUCH ULTRA TEST) test strip Check blood sugar 2 times daily 200 each 3   hydroxychloroquine (PLAQUENIL) 200  MG tablet Take 1 tablet (200 mg total) by mouth 2 (two) times daily. 180 tablet 0   Lancets (ONETOUCH DELICA PLUS LANCET33G) MISC Check blood sugar two times a day. 200 each 3   metFORMIN (GLUCOPHAGE) 500 MG tablet Take 1 tablet (500 mg total) by mouth daily. 90 tablet 4   metFORMIN (GLUCOPHAGE) 500 MG tablet Take 1 tablet (500 mg total) by mouth daily. 90 tablet 4   methotrexate (RHEUMATREX) 2.5 MG tablet Take 6 tablets (15 mg total) by mouth once a week. 72 tablet 0   ONE TOUCH ULTRA TEST test strip TEST BLOOD SUGAR BID AS DIRECTED  2   vitamin E 100 UNIT capsule Take 100 Units by mouth daily.     metFORMIN (GLUCOPHAGE) 500 MG tablet Take 500 mg by mouth 2 (two) times daily with a meal.     No facility-administered medications prior to visit.    ROS   Objective:   Vitals:   01/23/23 1029  BP: (!) 94/54  Pulse: 88  Resp: 16  SpO2: 97%  Weight: 151 lb (68.5 kg)  Height: 6\' 1"  (1.854 m)   SpO2: 97 %  Physical Exam: General: Elder gentleman Well-appearing, no acute distress HENT: [place], AT Eyes: EOMI, no scleral icterus Respiratory: Clear to auscultation bilaterally.  No crackles, wheezing or rales Cardiovascular: RRR, -M/R/G, no JVD Extremities:-Edema,-tenderness Neuro: AAO x4, CNII-XII grossly intact Psych: Normal mood, normal affect  Data Reviewed:  Imaging:  DG BONE DENSITY (DXA)  Result Date: 01/16/2023 EXAM: DUAL X-RAY ABSORPTIOMETRY (DXA) FOR BONE MINERAL DENSITY IMPRESSION: Referring Physician:  Pollyann Savoy Your patient completed a bone mineral density test using GE Lunar iDXA system (analysis version: 16). Technologist: ALW PATIENT: Name: Jeffery [name] H Patient ID: [id number] Birth Date: Apr 07, 1935 Height: 73.0 in. Sex: Male Measured: 01/16/2023 Weight: 152.0 lbs. Indications: Advanced Age, Caucasian, Diabetic (non-insulin), History of osteopenia, Methotrexate, Plaquenil, Prostate Cancer, Rheumatoid Arthritis Fractures: Treatments: Vitamin D ASSESSMENT: The  BMD measured at DualFemur Total Right is 0.748 g/cm2 with a T-score of -2.5. This patient is considered osteoporotic according to the World Health Organization [facility]) criteria. The scan quality is good. Lumbar Spine excluded due to degenerative changes. Site Region Measured Date Measured Age YA BMD Significant CHANGE T-score DualFemur Total Right 01/16/2023 86.1 -2.5 0.748 g/cm2 DualFemur Total Mean 01/16/2023 86.1 -2.4 0.762 g/cm2 Left Forearm Radius 33% 01/16/2023 86.1 -1.6 0.828 g/cm2 World Health Organization [facility]) criteria for post-menopausal, Caucasian Women: Normal       T-score at or above -1 SD Osteopenia   T-score between -1 and -2.5 SD Osteoporosis T-score  Outpatient Medications Prior to Visit  Medication Sig Dispense Refill   acetaminophen (TYLENOL) 500 MG tablet Take 500 mg by mouth every 6 (six) hours as needed.     calcium carbonate (OS-CAL) 1250 (500 Ca) MG chewable tablet Chew 1 tablet by mouth daily.     cholecalciferol (VITAMIN D3) 25 MCG (1000 UNIT) tablet Take 1,000 Units by mouth daily.     clonazePAM (KLONOPIN) 0.5 MG tablet Take 1 tablet (0.5 mg total) by mouth at bedtime as needed as directed. 30 tablet 0   diclofenac Sodium (VOLTAREN) 1 % GEL Apply 2-4 grams to affected joint 4 times daily as needed. 400 g 2   folic acid (FOLVITE) 1 MG tablet Take 1 tablet (1 mg total) by mouth daily. 90 tablet 3   glucose blood (ONETOUCH ULTRA TEST) test strip Check blood sugar 2 times daily 200 each 3   hydroxychloroquine (PLAQUENIL) 200  MG tablet Take 1 tablet (200 mg total) by mouth 2 (two) times daily. 180 tablet 0   Lancets (ONETOUCH DELICA PLUS LANCET33G) MISC Check blood sugar two times a day. 200 each 3   metFORMIN (GLUCOPHAGE) 500 MG tablet Take 1 tablet (500 mg total) by mouth daily. 90 tablet 4   metFORMIN (GLUCOPHAGE) 500 MG tablet Take 1 tablet (500 mg total) by mouth daily. 90 tablet 4   methotrexate (RHEUMATREX) 2.5 MG tablet Take 6 tablets (15 mg total) by mouth once a week. 72 tablet 0   ONE TOUCH ULTRA TEST test strip TEST BLOOD SUGAR BID AS DIRECTED  2   vitamin E 100 UNIT capsule Take 100 Units by mouth daily.     metFORMIN (GLUCOPHAGE) 500 MG tablet Take 500 mg by mouth 2 (two) times daily with a meal.     No facility-administered medications prior to visit.    ROS   Objective:   Vitals:   01/23/23 1029  BP: (!) 94/54  Pulse: 88  Resp: 16  SpO2: 97%  Weight: 151 lb (68.5 kg)  Height: 6\' 1"  (1.854 m)   SpO2: 97 %  Physical Exam: General: Elder gentleman Well-appearing, no acute distress HENT: [place], AT Eyes: EOMI, no scleral icterus Respiratory: Clear to auscultation bilaterally.  No crackles, wheezing or rales Cardiovascular: RRR, -M/R/G, no JVD Extremities:-Edema,-tenderness Neuro: AAO x4, CNII-XII grossly intact Psych: Normal mood, normal affect  Data Reviewed:  Imaging:  DG BONE DENSITY (DXA)  Result Date: 01/16/2023 EXAM: DUAL X-RAY ABSORPTIOMETRY (DXA) FOR BONE MINERAL DENSITY IMPRESSION: Referring Physician:  Pollyann Savoy Your patient completed a bone mineral density test using GE Lunar iDXA system (analysis version: 16). Technologist: ALW PATIENT: Name: Jeffery [name] H Patient ID: [id number] Birth Date: Apr 07, 1935 Height: 73.0 in. Sex: Male Measured: 01/16/2023 Weight: 152.0 lbs. Indications: Advanced Age, Caucasian, Diabetic (non-insulin), History of osteopenia, Methotrexate, Plaquenil, Prostate Cancer, Rheumatoid Arthritis Fractures: Treatments: Vitamin D ASSESSMENT: The  BMD measured at DualFemur Total Right is 0.748 g/cm2 with a T-score of -2.5. This patient is considered osteoporotic according to the World Health Organization [facility]) criteria. The scan quality is good. Lumbar Spine excluded due to degenerative changes. Site Region Measured Date Measured Age YA BMD Significant CHANGE T-score DualFemur Total Right 01/16/2023 86.1 -2.5 0.748 g/cm2 DualFemur Total Mean 01/16/2023 86.1 -2.4 0.762 g/cm2 Left Forearm Radius 33% 01/16/2023 86.1 -1.6 0.828 g/cm2 World Health Organization [facility]) criteria for post-menopausal, Caucasian Women: Normal       T-score at or above -1 SD Osteopenia   T-score between -1 and -2.5 SD Osteoporosis T-score  Outpatient Medications Prior to Visit  Medication Sig Dispense Refill   acetaminophen (TYLENOL) 500 MG tablet Take 500 mg by mouth every 6 (six) hours as needed.     calcium carbonate (OS-CAL) 1250 (500 Ca) MG chewable tablet Chew 1 tablet by mouth daily.     cholecalciferol (VITAMIN D3) 25 MCG (1000 UNIT) tablet Take 1,000 Units by mouth daily.     clonazePAM (KLONOPIN) 0.5 MG tablet Take 1 tablet (0.5 mg total) by mouth at bedtime as needed as directed. 30 tablet 0   diclofenac Sodium (VOLTAREN) 1 % GEL Apply 2-4 grams to affected joint 4 times daily as needed. 400 g 2   folic acid (FOLVITE) 1 MG tablet Take 1 tablet (1 mg total) by mouth daily. 90 tablet 3   glucose blood (ONETOUCH ULTRA TEST) test strip Check blood sugar 2 times daily 200 each 3   hydroxychloroquine (PLAQUENIL) 200  MG tablet Take 1 tablet (200 mg total) by mouth 2 (two) times daily. 180 tablet 0   Lancets (ONETOUCH DELICA PLUS LANCET33G) MISC Check blood sugar two times a day. 200 each 3   metFORMIN (GLUCOPHAGE) 500 MG tablet Take 1 tablet (500 mg total) by mouth daily. 90 tablet 4   metFORMIN (GLUCOPHAGE) 500 MG tablet Take 1 tablet (500 mg total) by mouth daily. 90 tablet 4   methotrexate (RHEUMATREX) 2.5 MG tablet Take 6 tablets (15 mg total) by mouth once a week. 72 tablet 0   ONE TOUCH ULTRA TEST test strip TEST BLOOD SUGAR BID AS DIRECTED  2   vitamin E 100 UNIT capsule Take 100 Units by mouth daily.     metFORMIN (GLUCOPHAGE) 500 MG tablet Take 500 mg by mouth 2 (two) times daily with a meal.     No facility-administered medications prior to visit.    ROS   Objective:   Vitals:   01/23/23 1029  BP: (!) 94/54  Pulse: 88  Resp: 16  SpO2: 97%  Weight: 151 lb (68.5 kg)  Height: 6\' 1"  (1.854 m)   SpO2: 97 %  Physical Exam: General: Elder gentleman Well-appearing, no acute distress HENT: [place], AT Eyes: EOMI, no scleral icterus Respiratory: Clear to auscultation bilaterally.  No crackles, wheezing or rales Cardiovascular: RRR, -M/R/G, no JVD Extremities:-Edema,-tenderness Neuro: AAO x4, CNII-XII grossly intact Psych: Normal mood, normal affect  Data Reviewed:  Imaging:  DG BONE DENSITY (DXA)  Result Date: 01/16/2023 EXAM: DUAL X-RAY ABSORPTIOMETRY (DXA) FOR BONE MINERAL DENSITY IMPRESSION: Referring Physician:  Pollyann Savoy Your patient completed a bone mineral density test using GE Lunar iDXA system (analysis version: 16). Technologist: ALW PATIENT: Name: Jeffery [name] H Patient ID: [id number] Birth Date: Apr 07, 1935 Height: 73.0 in. Sex: Male Measured: 01/16/2023 Weight: 152.0 lbs. Indications: Advanced Age, Caucasian, Diabetic (non-insulin), History of osteopenia, Methotrexate, Plaquenil, Prostate Cancer, Rheumatoid Arthritis Fractures: Treatments: Vitamin D ASSESSMENT: The  BMD measured at DualFemur Total Right is 0.748 g/cm2 with a T-score of -2.5. This patient is considered osteoporotic according to the World Health Organization [facility]) criteria. The scan quality is good. Lumbar Spine excluded due to degenerative changes. Site Region Measured Date Measured Age YA BMD Significant CHANGE T-score DualFemur Total Right 01/16/2023 86.1 -2.5 0.748 g/cm2 DualFemur Total Mean 01/16/2023 86.1 -2.4 0.762 g/cm2 Left Forearm Radius 33% 01/16/2023 86.1 -1.6 0.828 g/cm2 World Health Organization [facility]) criteria for post-menopausal, Caucasian Women: Normal       T-score at or above -1 SD Osteopenia   T-score between -1 and -2.5 SD Osteoporosis T-score

## 2023-01-24 ENCOUNTER — Telehealth (HOSPITAL_COMMUNITY): Payer: Self-pay

## 2023-01-24 ENCOUNTER — Encounter (HOSPITAL_COMMUNITY): Payer: Self-pay

## 2023-01-24 NOTE — Telephone Encounter (Signed)
Attempted to call patient in regards to Pulmonary Rehab - LM on VM Mailed letter

## 2023-01-24 NOTE — Telephone Encounter (Signed)
Pt insurance is active and benefits verified through Medicare A/B. Co-pay $0.00, DED $240.00/$240.00 met, out of pocket $0.00/$0.00 met, co-insurance 20%. No pre-authorization required. 01/24/23 @ 4:11PM   2ndary insurance is active and benefits verified through [employer]. Co-pay $0.00, DED $0.00/$0.00 met, out of pocket $0.00/$0.00 met, co-insurance 0%. No pre-authorization required. 01/24/23 @ 4:12PM   Will contact patient to see if he is interested in the Pulmonary Rehab Program. Patient will be contacted for scheduling upon review by the RN Navigator.

## 2023-02-03 ENCOUNTER — Ambulatory Visit: Payer: Medicare Other | Admitting: Cardiology

## 2023-02-06 ENCOUNTER — Other Ambulatory Visit (HOSPITAL_BASED_OUTPATIENT_CLINIC_OR_DEPARTMENT_OTHER): Payer: Self-pay

## 2023-02-06 NOTE — Progress Notes (Unsigned)
Office Visit Note  Patient: Jeffery [name]             Date of Birth: Sep 29, 1934           MRN: [id number]             PCP: Creola Corn, MD Referring: Creola Corn, MD Visit Date: 02/09/2023 Occupation: @GUAROCC @  Subjective:  No chief complaint on file.   History of Present Illness: Jeffery [name] is a 88 y.o. male ***     Activities of Daily Living:  Patient reports morning stiffness for *** {minute/hour:19697}.   Patient {ACTIONS;DENIES/REPORTS:21021675::"Denies"} nocturnal pain.  Difficulty dressing/grooming: {ACTIONS;DENIES/REPORTS:21021675::"Denies"} Difficulty climbing stairs: {ACTIONS;DENIES/REPORTS:21021675::"Denies"} Difficulty getting out of chair: {ACTIONS;DENIES/REPORTS:21021675::"Denies"} Difficulty using hands for taps, buttons, cutlery, and/or writing: {ACTIONS;DENIES/REPORTS:21021675::"Denies"}  No Rheumatology ROS completed.   PMFS History:  Patient Active Problem List   Diagnosis Date Noted   Shortness of breath 01/23/2023   PVC's (premature ventricular contractions) 12/12/2022   Ptosis of left eyelid 12/07/2022   Slurred speech 12/07/2022   Gait abnormality 12/07/2022   Vitreomacular adhesion, right 08/16/2021   Macular retinoschisis of right eye 08/16/2021   Pulmonary nodules 07/04/2021   Essential hypertension 07/04/2021   Sleep apnea 06/28/2020   Dyslipidemia 06/28/2020   Memory changes 04/16/2020   Pacemaker ECG pattern 09/20/2019   Treatment-emergent central sleep apnea 09/20/2019   Round hole of right eye 08/26/2019   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/26/2019   Cystoid macular edema of right eye 08/26/2019   Pseudophakia of both eyes 08/26/2019   Obstructive sleep apnea of adult 08/26/2019   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) 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 08/24/2019   Educated about COVID-19 virus infection 10/08/2018   Aortic root dilatation (HCC) 10/08/2018   Mitral annular calcification  10/08/2018   Neuropathy 08/21/2017   Hydroxychloroquine causing adverse effect in therapeutic use 08/21/2017   Symptomatic bradycardia 02/23/2017   Status cardiac pacemaker 02/23/2017   CSA (central sleep apnea) 02/23/2017   Heart block AV complete (HCC) 01/18/2017   Vitamin D deficiency 02/08/2016   Trigger finger, left middle finger 02/08/2016   Long-term use of Plaquenil 02/08/2016   Bursitis of right shoulder 02/08/2016   DDD (degenerative disc disease), lumbar 02/08/2016   DDD (degenerative disc disease), thoracic 02/08/2016   Rheumatoid arthritis of multiple sites without rheumatoid factor (HCC) 11/28/2015    Class: Chronic   Other secondary osteoarthritis of both hands 11/28/2015    Class: Chronic   PMR (polymyalgia rheumatica) (HCC) 11/28/2015    Class: Chronic   Osteoporosis 11/28/2015    Class: Chronic   Prostate cancer (HCC) 11/28/2015    Class: History of   Duodenal ulcer 11/28/2015    Class: Chronic   High cholesterol 11/28/2015   Dizziness 07/16/2015   Incomplete RBBB 03/31/2015   Sleep apnea, primary central 03/03/2015   Polyneuropathy (HCC) 01/24/2013   Right inguinal hernia 07/05/2012    Past Medical History:  Diagnosis Date   Arthritis    Autoimmune disorder (HCC)    BCC (basal cell carcinoma)    BPH (benign prostatic hypertrophy) 2008   Cancer (HCC) 2008   Prostate   Cervical stenosis of spine    per patient    Complete heart block (HCC)    Complex sleep apnea syndrome    does not use CPAP   Diabetes mellitus without complication (HCC)    Diverticulosis    ED (erectile dysfunction)    Gastritis    GERD (gastroesophageal  reflux disease)    Hemorrhoids    Hernia, inguinal    Hyperlipidemia    IFG (impaired fasting glucose)    Osteopenia    Peptic ulcer disease with hemorrhage 1993   Shingles    Sleep apnea    Vitamin D deficiency     Family History  Problem Relation Age of Onset   Heart failure Mother        Died age 74.  Apparently had  valve surgery.    Congenital heart disease Brother    Stroke Maternal Grandfather    Colon cancer Neg Hx    Stomach cancer Neg Hx    Esophageal cancer Neg Hx    Rectal cancer Neg Hx    Past Surgical History:  Procedure Laterality Date   BACK SURGERY N/A 1984   barium swallow study  2023   x2   CATARACT EXTRACTION, BILATERAL     HERNIA REPAIR     LAMINECTOMY  1984   L 5   PACEMAKER IMPLANT  12/05/2016   MDT Azure XT DR MRI conditional PPM implanted for CHB by Dr Barnie Mort in [place]   PACEMAKER INSERTION  12/05/2016   PROSTATE SURGERY  12/2006   Robotic   TONSILLECTOMY  88 years old   TRIGGER FINGER RELEASE  03/01/2012   Procedure: MINOR RELEASE TRIGGER FINGER/A-1 PULLEY;  Surgeon: [name], MD;  Location: [facility];  Service: Orthopedics;  Laterality: Left;  long finger   TRIGGER FINGER RELEASE Left 02/08/2018   Procedure: RELEASE TRIGGER FINGER/A-1 PULLEY LEFT RING FINGER;  Surgeon: Cindee Salt, MD;  Location: [facility];  Service: Orthopedics;  Laterality: Left;   UPPER GI ENDOSCOPY  2023   Social History   Social History Narrative   Right handed, Retired .Married, 2 kids. Caffeine none.   3 Step    Immunization History  Administered Date(s) Administered   Influenza, High Dose Seasonal PF 03/03/2017, 01/26/2018   Moderna SARS-COV2 Booster Vaccination 10/15/2020   Moderna Sars-Covid-2 Vaccination 04/13/2019, 05/14/2019, 12/12/2019   Pfizer(Comirnaty)Fall Seasonal Vaccine 12 years and older 03/17/2022     Objective: Vital Signs: There were no vitals taken for this visit.   Physical Exam   Musculoskeletal Exam: ***  CDAI Exam: CDAI Score: -- Patient Global: --; Provider Global: -- Swollen: --; Tender: -- Joint Exam 02/09/2023   No joint exam has been documented for this visit   There is currently no information documented on the homunculus. Go to the Rheumatology activity and complete the homunculus joint  exam.  Investigation: No additional findings.  Imaging: DG BONE DENSITY (DXA)  Result Date: 01/16/2023 EXAM: DUAL X-RAY ABSORPTIOMETRY (DXA) FOR BONE MINERAL DENSITY IMPRESSION: Referring Physician:  Pollyann Savoy Your patient completed a bone mineral density test using GE Lunar iDXA system (analysis version: 16). Technologist: ALW PATIENT: Name: Jeffery [name], Jeffery [name] Patient ID: [id number] Birth Date: 1934/09/07 Height: 73.0 in. Sex: Male Measured: 01/16/2023 Weight: 152.0 lbs. Indications: Advanced Age, Caucasian, Diabetic (non-insulin), History of osteopenia, Methotrexate, Plaquenil, Prostate Cancer, Rheumatoid Arthritis Fractures: Treatments: Vitamin D ASSESSMENT: The BMD measured at DualFemur Total Right is 0.748 g/cm2 with a T-score of -2.5. This patient is considered osteoporotic according to the World Health Organization [facility]) criteria. The scan quality is good. Lumbar Spine excluded due to degenerative changes. Site Region Measured Date Measured Age YA BMD Significant CHANGE T-score DualFemur Total Right 01/16/2023 86.1 -2.5 0.748 g/cm2 DualFemur Total Mean 01/16/2023 86.1 -2.4 0.762 g/cm2 Left Forearm Radius 33% 01/16/2023 86.1 -  reflux disease)    Hemorrhoids    Hernia, inguinal    Hyperlipidemia    IFG (impaired fasting glucose)    Osteopenia    Peptic ulcer disease with hemorrhage 1993   Shingles    Sleep apnea    Vitamin D deficiency     Family History  Problem Relation Age of Onset   Heart failure Mother        Died age 74.  Apparently had  valve surgery.    Congenital heart disease Brother    Stroke Maternal Grandfather    Colon cancer Neg Hx    Stomach cancer Neg Hx    Esophageal cancer Neg Hx    Rectal cancer Neg Hx    Past Surgical History:  Procedure Laterality Date   BACK SURGERY N/A 1984   barium swallow study  2023   x2   CATARACT EXTRACTION, BILATERAL     HERNIA REPAIR     LAMINECTOMY  1984   L 5   PACEMAKER IMPLANT  12/05/2016   MDT Azure XT DR MRI conditional PPM implanted for CHB by Dr Barnie Mort in [place]   PACEMAKER INSERTION  12/05/2016   PROSTATE SURGERY  12/2006   Robotic   TONSILLECTOMY  88 years old   TRIGGER FINGER RELEASE  03/01/2012   Procedure: MINOR RELEASE TRIGGER FINGER/A-1 PULLEY;  Surgeon: [name], MD;  Location: [facility];  Service: Orthopedics;  Laterality: Left;  long finger   TRIGGER FINGER RELEASE Left 02/08/2018   Procedure: RELEASE TRIGGER FINGER/A-1 PULLEY LEFT RING FINGER;  Surgeon: Cindee Salt, MD;  Location: [facility];  Service: Orthopedics;  Laterality: Left;   UPPER GI ENDOSCOPY  2023   Social History   Social History Narrative   Right handed, Retired .Married, 2 kids. Caffeine none.   3 Step    Immunization History  Administered Date(s) Administered   Influenza, High Dose Seasonal PF 03/03/2017, 01/26/2018   Moderna SARS-COV2 Booster Vaccination 10/15/2020   Moderna Sars-Covid-2 Vaccination 04/13/2019, 05/14/2019, 12/12/2019   Pfizer(Comirnaty)Fall Seasonal Vaccine 12 years and older 03/17/2022     Objective: Vital Signs: There were no vitals taken for this visit.   Physical Exam   Musculoskeletal Exam: ***  CDAI Exam: CDAI Score: -- Patient Global: --; Provider Global: -- Swollen: --; Tender: -- Joint Exam 02/09/2023   No joint exam has been documented for this visit   There is currently no information documented on the homunculus. Go to the Rheumatology activity and complete the homunculus joint  exam.  Investigation: No additional findings.  Imaging: DG BONE DENSITY (DXA)  Result Date: 01/16/2023 EXAM: DUAL X-RAY ABSORPTIOMETRY (DXA) FOR BONE MINERAL DENSITY IMPRESSION: Referring Physician:  Pollyann Savoy Your patient completed a bone mineral density test using GE Lunar iDXA system (analysis version: 16). Technologist: ALW PATIENT: Name: Jeffery [name], Jeffery [name] Patient ID: [id number] Birth Date: 1934/09/07 Height: 73.0 in. Sex: Male Measured: 01/16/2023 Weight: 152.0 lbs. Indications: Advanced Age, Caucasian, Diabetic (non-insulin), History of osteopenia, Methotrexate, Plaquenil, Prostate Cancer, Rheumatoid Arthritis Fractures: Treatments: Vitamin D ASSESSMENT: The BMD measured at DualFemur Total Right is 0.748 g/cm2 with a T-score of -2.5. This patient is considered osteoporotic according to the World Health Organization [facility]) criteria. The scan quality is good. Lumbar Spine excluded due to degenerative changes. Site Region Measured Date Measured Age YA BMD Significant CHANGE T-score DualFemur Total Right 01/16/2023 86.1 -2.5 0.748 g/cm2 DualFemur Total Mean 01/16/2023 86.1 -2.4 0.762 g/cm2 Left Forearm Radius 33% 01/16/2023 86.1 -  1.6 0.828 g/cm2 World Health Organization [facility]) criteria for post-menopausal, Caucasian Women: Normal       T-score at or above -1 SD Osteopenia   T-score between -1 and -2.5 SD Osteoporosis T-score at or below -2.5 SD RECOMMENDATION: Mild to aggressive therapies are available in the form of Hormone replacement therapy (HRT), bisphosphonates, Calcitonin, and SERMs. Additionally, all patients should ensure an adequate intake of dietary calcium (1200 mg/d) and vitamin D (400-800 IU daily). FOLLOW-UP: People with diagnosed cases of osteoporosis or osteopenia should be regularly tested for bone mineral density. For patients eligible for Medicare, routine testing is allowed once every 2 years. The testing frequency can be increased to one year for patients who have rapidly  progressing disease, or for those who are receiving medical therapy to restore bone mass. I have reviewed this study and agree with the findings. [facility] Electronically Signed   By: Romona Curls M.D.   On: 01/16/2023 11:54   CUP PACEART INCLINIC DEVICE CHECK  Result Date: 01/13/2023 Pacemaker check in clinic. Normal device function. Thresholds, sensing, impedances consistent with previous measurements. Device programmed to maximize longevity. No mode switch or high ventricular rates noted. Device programmed at appropriate safety margins. Histogram distribution appropriate for patient activity level. Rare PVCs by counters. Estimated longevity 83yr59mo. Patient enrolled in remote follow-up. Patient education completed.  NCV with EMG(electromyography)  Result Date: 01/11/2023 Levert Feinstein, MD     01/15/2023  9:08 PM     Full Name: Jeffery [name] Gender: Male MRN #: [id number] Date of Birth: Jan 16, 1935   Visit Date: 01/11/2023 13:32 Age: 81 Years Examining Physician: Dr. Levert Feinstein Referring Physician: Dr. Vickey Huger Height: 6 feet 2 inch History: 88 year old male, with history of lumbar decompression surgery, presenting with subacute worsening of gait abnormality since June 2024 Summary of the test: Nerve conduction study: Left sural, superficial peroneal sensory responses were absent.  Left median, ulnar sensory response showed moderately prolonged peak latency with moderately decreased snap amplitude.  Left radial sensory response showed normal peak latency with moderately decreased snap amplitude. Left peroneal to EDB motor responses were absent.  Left tibial motor responses showed significantly decreased CMAP amplitude. Left median, ulnar motor responses were normal Electromyography: Selected needle examination of bilateral lower extremity muscles, lumbosacral paraspinal muscles; right upper extremity muscles, cervical paraspinal muscles, right sternocleidomastoid, and right orbicularis oculi  muscles were performed There is evidence of chronic neuropathic changes involving bilateral lumbosacral paraspinal muscles, left worse than right, L4, 5, more than S1, There was no spontaneous activity at bilateral lumbosacral paraspinal muscles. There was no significant abnormalities at selected needle examination of right upper extremity muscles, cervical paraspinal muscles, sternocleidomastoid and right orbicularis oculi. Conclusion: This is an abnormal study.  There is electrodiagnostic evidence of moderate axonal sensorimotor polyneuropathy, with superimposed chronic bilateral lumbosacral radiculopathy, left worse than right, mainly involving bilateral L4-5 S1. There is no evidence of intrinsic muscle disease, or active cervical radiculopathy. ------------------------------- Forrestine Him.D.Ph.D. [facility] [address] [place], [place] [address] Tel: [phone] Fax: [phone] Verbal informed consent was obtained from the patient, patient was informed of potential risk of procedure, including bruising, bleeding, hematoma formation, infection, muscle weakness, muscle pain, numbness, among others.     MNC   Nerve / Sites Muscle Latency Ref. Amplitude Ref. Rel Amp Segments Distance Velocity Ref. Area   ms ms mV mV %  cm m/s m/s mVms L Median - APB    Wrist APB 4.2 <=4.4 5.6 >=4.0 100  1.6 0.828 g/cm2 World Health Organization [facility]) criteria for post-menopausal, Caucasian Women: Normal       T-score at or above -1 SD Osteopenia   T-score between -1 and -2.5 SD Osteoporosis T-score at or below -2.5 SD RECOMMENDATION: Mild to aggressive therapies are available in the form of Hormone replacement therapy (HRT), bisphosphonates, Calcitonin, and SERMs. Additionally, all patients should ensure an adequate intake of dietary calcium (1200 mg/d) and vitamin D (400-800 IU daily). FOLLOW-UP: People with diagnosed cases of osteoporosis or osteopenia should be regularly tested for bone mineral density. For patients eligible for Medicare, routine testing is allowed once every 2 years. The testing frequency can be increased to one year for patients who have rapidly  progressing disease, or for those who are receiving medical therapy to restore bone mass. I have reviewed this study and agree with the findings. [facility] Electronically Signed   By: Romona Curls M.D.   On: 01/16/2023 11:54   CUP PACEART INCLINIC DEVICE CHECK  Result Date: 01/13/2023 Pacemaker check in clinic. Normal device function. Thresholds, sensing, impedances consistent with previous measurements. Device programmed to maximize longevity. No mode switch or high ventricular rates noted. Device programmed at appropriate safety margins. Histogram distribution appropriate for patient activity level. Rare PVCs by counters. Estimated longevity 83yr59mo. Patient enrolled in remote follow-up. Patient education completed.  NCV with EMG(electromyography)  Result Date: 01/11/2023 Levert Feinstein, MD     01/15/2023  9:08 PM     Full Name: Jeffery [name] Gender: Male MRN #: [id number] Date of Birth: Jan 16, 1935   Visit Date: 01/11/2023 13:32 Age: 81 Years Examining Physician: Dr. Levert Feinstein Referring Physician: Dr. Vickey Huger Height: 6 feet 2 inch History: 88 year old male, with history of lumbar decompression surgery, presenting with subacute worsening of gait abnormality since June 2024 Summary of the test: Nerve conduction study: Left sural, superficial peroneal sensory responses were absent.  Left median, ulnar sensory response showed moderately prolonged peak latency with moderately decreased snap amplitude.  Left radial sensory response showed normal peak latency with moderately decreased snap amplitude. Left peroneal to EDB motor responses were absent.  Left tibial motor responses showed significantly decreased CMAP amplitude. Left median, ulnar motor responses were normal Electromyography: Selected needle examination of bilateral lower extremity muscles, lumbosacral paraspinal muscles; right upper extremity muscles, cervical paraspinal muscles, right sternocleidomastoid, and right orbicularis oculi  muscles were performed There is evidence of chronic neuropathic changes involving bilateral lumbosacral paraspinal muscles, left worse than right, L4, 5, more than S1, There was no spontaneous activity at bilateral lumbosacral paraspinal muscles. There was no significant abnormalities at selected needle examination of right upper extremity muscles, cervical paraspinal muscles, sternocleidomastoid and right orbicularis oculi. Conclusion: This is an abnormal study.  There is electrodiagnostic evidence of moderate axonal sensorimotor polyneuropathy, with superimposed chronic bilateral lumbosacral radiculopathy, left worse than right, mainly involving bilateral L4-5 S1. There is no evidence of intrinsic muscle disease, or active cervical radiculopathy. ------------------------------- Forrestine Him.D.Ph.D. [facility] [address] [place], [place] [address] Tel: [phone] Fax: [phone] Verbal informed consent was obtained from the patient, patient was informed of potential risk of procedure, including bruising, bleeding, hematoma formation, infection, muscle weakness, muscle pain, numbness, among others.     MNC   Nerve / Sites Muscle Latency Ref. Amplitude Ref. Rel Amp Segments Distance Velocity Ref. Area   ms ms mV mV %  cm m/s m/s mVms L Median - APB    Wrist APB 4.2 <=4.4 5.6 >=4.0 100

## 2023-02-07 ENCOUNTER — Encounter: Payer: Self-pay | Admitting: Neurology

## 2023-02-07 ENCOUNTER — Ambulatory Visit (INDEPENDENT_AMBULATORY_CARE_PROVIDER_SITE_OTHER): Payer: Medicare Other | Admitting: Neurology

## 2023-02-07 VITALS — BP 122/70 | HR 84 | Ht 74.0 in | Wt 151.0 lb

## 2023-02-07 DIAGNOSIS — M51369 Other intervertebral disc degeneration, lumbar region without mention of lumbar back pain or lower extremity pain: Secondary | ICD-10-CM | POA: Diagnosis not present

## 2023-02-07 DIAGNOSIS — G629 Polyneuropathy, unspecified: Secondary | ICD-10-CM | POA: Diagnosis not present

## 2023-02-07 MED ORDER — ALPHA-LIPOIC ACID 600 MG PO TABS: ORAL_TABLET | ORAL | Status: DC

## 2023-02-07 NOTE — Patient Instructions (Signed)
ASSESSMENT AND PLAN 88 y.o. year old male  here with:    1) Gait disorder as a function of Polyneuropathy and L-s radiculopathy.   2)Already in PT and OT and uses a walker, feels safe.   3) Less stressed, getting better sleep, cognitively improved  and speech is clear.    I plan to follow up either personally or through our NP within 6-9  months.

## 2023-02-07 NOTE — Addendum Note (Signed)
Addended by: [name] on: 02/07/2023 10:43 AM   Modules accepted: Orders

## 2023-02-07 NOTE — Progress Notes (Signed)
Provider:  [name], MD  Primary Care Physician:  Creola Corn, MD [address] [place] [address]     Referring Provider: Creola Corn, Md [address] [place],  [place] [address]          Chief Complaint according to patient   Patient presents with:     New Patient (Initial Visit)           HISTORY OF PRESENT ILLNESS:  Jeffery [name] is a 88 y.o. male patient who is here for revisit 02/07/2023 for his gait and weakness problem.  He is involved in all PT, OT therapy and swallowing tests, saw pulmonary.  EMG and NCV : Polyneuropathy was diagnosed. Chronic L5-S1 radiculopathy .  Rheumatology has commented and also agrees with MG not being present.  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   He eats and yet is losing weight. He came with a walker and stood up to greet me, came here with his daughter.   Chief concern according to patient :  " should I use coenzyme Q 10, and do in need to take alpha lipoic fatty acids- I eat a lot of fish and seafood."   I feel a bit better.  He started Klonopin at night for sleep. Dr. Antoine Poche recommended and prescribed it. It helps him through his wife's sundowning.    Review of Systems: Out of a complete 14 system review, the patient complains of only the following symptoms, and all other reviewed systems are negative.:    Social History   Socioeconomic History   Marital status: Married    Spouse name: Not on file   Number of children: 2   Years of education: Not on file   Highest education level: Not on file  Occupational History   Not on file  Tobacco Use   Smoking status: Never    Passive exposure: Never   Smokeless tobacco: Never  Vaping Use   Vaping status: Never Used  Substance and Sexual Activity   Alcohol use: Not Currently   Drug use: No   Sexual activity: Not on file  Other Topics Concern   Not on file  Social History Narrative   Right handed, Retired .Married, 2  kids. Caffeine none.   3 Step    Social Determinants of Health   Financial Resource Strain: Not on file  Food Insecurity: Not on file  Transportation Needs: Not on file  Physical Activity: Not on file  Stress: Not on file  Social Connections: Unknown (10/28/2022)   [facility] from [facility]   Social Network    Social Network: Not on file    Family History  Problem Relation Age of Onset   Heart failure Mother        Died age 21.  Apparently had valve surgery.    Congenital heart disease Brother    Stroke Maternal Grandfather    Colon cancer Neg Hx    Stomach cancer Neg Hx    Esophageal cancer Neg Hx    Rectal cancer Neg Hx     Past Medical History:  Diagnosis Date   Arthritis    Autoimmune disorder (HCC)    BCC (basal cell carcinoma)    BPH (benign prostatic hypertrophy) 2008   Cancer [facility]) 2008   Prostate   Cervical stenosis of spine    per patient    Complete heart block (HCC)  Complex sleep apnea syndrome    does not use CPAP   Diabetes mellitus without complication (HCC)    Diverticulosis    ED (erectile dysfunction)    Gastritis    GERD (gastroesophageal reflux disease)    Hemorrhoids    Hernia, inguinal    Hyperlipidemia    IFG (impaired fasting glucose)    Osteopenia    Peptic ulcer disease with hemorrhage 1993   Shingles    Sleep apnea    Vitamin D deficiency     Past Surgical History:  Procedure Laterality Date   BACK SURGERY N/A 1984   barium swallow study  2023   x2   CATARACT EXTRACTION, BILATERAL     HERNIA REPAIR     LAMINECTOMY  1984   L 5   PACEMAKER IMPLANT  12/05/2016   MDT Azure XT DR MRI conditional PPM implanted for CHB by Dr Barnie Mort in [place]   PACEMAKER INSERTION  12/05/2016   PROSTATE SURGERY  12/2006   Robotic   TONSILLECTOMY  88 years old   TRIGGER FINGER RELEASE  03/01/2012   Procedure: MINOR RELEASE TRIGGER FINGER/A-1 PULLEY;  Surgeon: [name], MD;  Location: [facility];  Service:  Orthopedics;  Laterality: Left;  long finger   TRIGGER FINGER RELEASE Left 02/08/2018   Procedure: RELEASE TRIGGER FINGER/A-1 PULLEY LEFT RING FINGER;  Surgeon: Cindee Salt, MD;  Location: [facility];  Service: Orthopedics;  Laterality: Left;   UPPER GI ENDOSCOPY  2023     Current Outpatient Medications on File Prior to Visit  Medication Sig Dispense Refill   acetaminophen (TYLENOL) 500 MG tablet Take 500 mg by mouth every 6 (six) hours as needed.     calcium carbonate (OS-CAL) 1250 (500 Ca) MG chewable tablet Chew 1 tablet by mouth daily.     cholecalciferol (VITAMIN D3) 25 MCG (1000 UNIT) tablet Take 1,000 Units by mouth daily.     clonazePAM (KLONOPIN) 0.5 MG tablet Take 1 tablet (0.5 mg total) by mouth at bedtime as needed as directed. 30 tablet 0   clonazePAM (KLONOPIN) 0.5 MG tablet Take 1 tablet (0.5 mg total) by mouth at bedtime as needed. 30 tablet 2   Coenzyme Q10 100 MG TABS Take by mouth daily.     diclofenac Sodium (VOLTAREN) 1 % GEL Apply 2-4 grams to affected joint 4 times daily as needed. 400 g 2   folic acid (FOLVITE) 1 MG tablet Take 1 tablet (1 mg total) by mouth daily. 90 tablet 3   glucose blood (ONETOUCH ULTRA TEST) test strip Check blood sugar 2 times daily 200 each 3   hydroxychloroquine (PLAQUENIL) 200 MG tablet Take 1 tablet (200 mg total) by mouth 2 (two) times daily. 180 tablet 0   Lancets (ONETOUCH DELICA PLUS LANCET33G) MISC Check blood sugar two times a day. 200 each 3   metFORMIN (GLUCOPHAGE) 500 MG tablet Take 1 tablet (500 mg total) by mouth daily. 90 tablet 4   metFORMIN (GLUCOPHAGE) 500 MG tablet Take 1 tablet (500 mg total) by mouth daily. 90 tablet 4   methotrexate (RHEUMATREX) 2.5 MG tablet Take 6 tablets (15 mg total) by mouth once a week. 72 tablet 0   ONE TOUCH ULTRA TEST test strip TEST BLOOD SUGAR BID AS DIRECTED  2   vitamin E 100 UNIT capsule Take 100 Units by mouth daily.     No current facility-administered medications on file  prior to visit.    Allergies  Allergen  Reactions   Sulfasalazine Anaphylaxis   Demerol [Meperidine] Nausea Only   Garlic    Other     SHALLOTS   Sulfa Antibiotics      DIAGNOSTIC DATA (LABS, IMAGING, TESTING) - I reviewed patient records, labs, notes, testing and imaging myself where available.  Lab Results  Component Value Date   WBC 4.7 01/19/2023   HGB 14.2 01/19/2023   HCT 43.8 01/19/2023   MCV 96.9 01/19/2023   PLT 254 01/19/2023      Component Value Date/Time   NA 141 01/19/2023 1449   NA 142 03/19/2019 1537   K 4.9 01/19/2023 1449   CL 98 01/19/2023 1449   CO2 35 (H) 01/19/2023 1449   GLUCOSE 120 (H) 01/19/2023 1449   BUN 15 01/19/2023 1449   BUN 16 03/19/2019 1537   CREATININE 0.73 01/19/2023 1449   CALCIUM 9.8 01/19/2023 1449   PROT 7.0 01/19/2023 1449   PROT 6.8 11/07/2022 0932   ALBUMIN 4.7 (H) 03/19/2019 1537   AST 7 (L) 01/19/2023 1449   ALT 13 01/19/2023 1449   ALKPHOS 85 03/19/2019 1537   BILITOT 0.6 01/19/2023 1449   BILITOT 0.5 03/19/2019 1537   GFRNONAA 88 08/10/2020 1538   GFRAA 102 08/10/2020 1538   No results found for: "CHOL", "HDL", "LDLCALC", "LDLDIRECT", "TRIG", "CHOLHDL" No results found for: "HGBA1C" Lab Results  Component Value Date   VITAMINB12 447 01/08/2020   Lab Results  Component Value Date   TSH 1.34 01/24/2017    PHYSICAL EXAM:  Today's Vitals   02/07/23 0947  BP: 122/70  Pulse: 84  Weight: 151 lb (68.5 kg)  Height: 6\' 2"  (1.88 m)   Body mass index is 19.39 kg/m.   Wt Readings from Last 3 Encounters:  02/07/23 151 lb (68.5 kg)  01/23/23 151 lb (68.5 kg)  01/13/23 152 lb (68.9 kg)     Ht Readings from Last 3 Encounters:  02/07/23 6\' 2"  (1.88 m)  01/23/23 6\' 1"  (1.854 m)  01/13/23 6\' 1"  (1.854 m)      General: The patient is awake, alert and appears not in acute distress. The patient is well groomed. Head: Normocephalic, atraumatic. Neck is supple. Cardiovascular:  irregular rate and cardiac rhythm by  pulse,  without distended neck veins. Respiratory: Lungs are clear to auscultation.  Skin:  Without evidence of ankle edema, or rash. Trunk: The patient's posture is erect.   NEUROLOGIC EXAM: The patient is awake and alert, oriented to place and time.   Memory subjective described as intact.  Attention span & concentration ability appears normal.  Speech is fluent,  without  dysarthria, dysphonia or aphasia.  Mood and affect are appropriate.   Cranial nerves: no loss of smell or taste reported  Pupils are equal and briskly reactive to light. Extraocular movements in vertical and horizontal planes were intact . He always had a mild ptosis and a lazy eye.Marland Kitchen No Diplopia. Visual fields by finger perimetry are intact. Hearing was intact to soft voice and finger rubbing.    Facial sensation intact to fine touch.  Facial motor strength is symmetric and tongue and uvula move midline. Yet, his speech is slurred" tongue heavy". Neck ROM : rotation, tilt and flexion extension were normal for age and shoulder shrug was symmetrical.    Motor exam:  Symmetric bulk, tone and ROM.   significant fatigability  Normal tone without cog- wheeling, symmetric grip strength ,Restricted to the  arthritic pain. .   Sensory:  Fine touch, pinprick  and vibration were tested  and  normal.  Proprioception tested in the upper extremities was normal.   Coordination: Rapid alternating movements in the fingers/hands were of normal speed.  The Finger-to-nose maneuver was intact without evidence of ataxia, dysmetria or tremor.   Gait and station: Patient could rise unassisted from a seated position, walked with a cane , assistive device.  Stance is of normal width/ base and the patient turned with 3 steps.  Toe and heel walk were deferred. His gait slows as he walks, he is unsteady.  Deep tendon reflexes: in the  upper and lower extremities are symmetric and intact.  Babinski response was deferred .    ASSESSMENT AND  PLAN 88 y.o. year old male  here with:    1) Gait disorder as a function of Polyneuropathy and L-s radiculopathy.   2)Already in PT and OT and uses a walker, feels safe.   3) Less stressed, better sleep, cognitively  and speech is clear.    I plan to follow up either personally or through our NP within 6 months.   I would like to thank Creola Corn, MD and Creola Corn, Md [address] [place],  [place] [address] for allowing me to meet with and to take care of this pleasant patient.   After spending a total time of  35  minutes face to face and additional time for physical and neurologic examination, review of laboratory studies,  personal review of imaging studies, reports and results of other testing and review of referral information / records as far as provided in visit,   Electronically signed by: [name], MD 02/07/2023 10:13 AM  [name] Board certified by The [employer] of Sleep Medicine and Diplomate of the [employer] of Sleep Medicine. Board certified In Neurology through the ABPN, Fellow of the [employer] of Neu[employer].

## 2023-02-08 ENCOUNTER — Other Ambulatory Visit (HOSPITAL_BASED_OUTPATIENT_CLINIC_OR_DEPARTMENT_OTHER): Payer: Self-pay

## 2023-02-09 ENCOUNTER — Ambulatory Visit: Payer: Medicare Other | Attending: Rheumatology | Admitting: Rheumatology

## 2023-02-09 ENCOUNTER — Encounter: Payer: Self-pay | Admitting: Rheumatology

## 2023-02-09 VITALS — BP 109/66 | HR 76 | Resp 14 | Ht 73.0 in | Wt 150.8 lb

## 2023-02-09 DIAGNOSIS — Z8639 Personal history of other endocrine, nutritional and metabolic disease: Secondary | ICD-10-CM | POA: Insufficient documentation

## 2023-02-09 DIAGNOSIS — M353 Polymyalgia rheumatica: Secondary | ICD-10-CM | POA: Insufficient documentation

## 2023-02-09 DIAGNOSIS — R5383 Other fatigue: Secondary | ICD-10-CM | POA: Diagnosis present

## 2023-02-09 DIAGNOSIS — M19041 Primary osteoarthritis, right hand: Secondary | ICD-10-CM | POA: Diagnosis present

## 2023-02-09 DIAGNOSIS — M19042 Primary osteoarthritis, left hand: Secondary | ICD-10-CM | POA: Diagnosis present

## 2023-02-09 DIAGNOSIS — Z79899 Other long term (current) drug therapy: Secondary | ICD-10-CM | POA: Diagnosis present

## 2023-02-09 DIAGNOSIS — R0602 Shortness of breath: Secondary | ICD-10-CM | POA: Diagnosis present

## 2023-02-09 DIAGNOSIS — I442 Atrioventricular block, complete: Secondary | ICD-10-CM | POA: Diagnosis present

## 2023-02-09 DIAGNOSIS — R2681 Unsteadiness on feet: Secondary | ICD-10-CM | POA: Insufficient documentation

## 2023-02-09 DIAGNOSIS — M0609 Rheumatoid arthritis without rheumatoid factor, multiple sites: Secondary | ICD-10-CM | POA: Diagnosis present

## 2023-02-09 DIAGNOSIS — M5134 Other intervertebral disc degeneration, thoracic region: Secondary | ICD-10-CM | POA: Diagnosis present

## 2023-02-09 DIAGNOSIS — Z8546 Personal history of malignant neoplasm of prostate: Secondary | ICD-10-CM | POA: Insufficient documentation

## 2023-02-09 DIAGNOSIS — M47816 Spondylosis without myelopathy or radiculopathy, lumbar region: Secondary | ICD-10-CM | POA: Insufficient documentation

## 2023-02-09 DIAGNOSIS — M5030 Other cervical disc degeneration, unspecified cervical region: Secondary | ICD-10-CM | POA: Insufficient documentation

## 2023-02-09 DIAGNOSIS — Z8669 Personal history of other diseases of the nervous system and sense organs: Secondary | ICD-10-CM | POA: Diagnosis present

## 2023-02-09 DIAGNOSIS — M810 Age-related osteoporosis without current pathological fracture: Secondary | ICD-10-CM | POA: Insufficient documentation

## 2023-02-09 DIAGNOSIS — M8589 Other specified disorders of bone density and structure, multiple sites: Secondary | ICD-10-CM

## 2023-02-09 NOTE — Patient Instructions (Addendum)
Standing Labs We placed an order today for your standing lab work.   Please have your standing labs drawn in January and every 3 months  Please have your labs drawn 2 weeks prior to your appointment so that the provider can discuss your lab results at your appointment, if possible.  Please note that you may see your imaging and lab results in MyChart before we have reviewed them. We will contact you once all results are reviewed. Please allow our office up to 72 hours to thoroughly review all of the results before contacting the office for clarification of your results.  WALK-IN LAB HOURS  Monday through Thursday from 8:00 am -12:30 pm and 1:00 pm-5:00 pm and Friday from 8:00 am-12:00 pm.  Patients with office visits requiring labs will be seen before walk-in labs.  You may encounter longer than normal wait times. Please allow additional time. Wait times may be shorter on  Monday and Thursday afternoons.  We do not book appointments for walk-in labs. We appreciate your patience and understanding with our staff.   Labs are drawn by [facility]. Please bring your co-pay at the time of your lab draw.  You may receive a bill from [facility] for your lab work.  Please note if you are on Hydroxychloroquine and and an order has been placed for a Hydroxychloroquine level,  you will need to have it drawn 4 hours or more after your last dose.  If you wish to have your labs drawn at another location, please call the office 24 hours in advance so we can fax the orders.  The office is located at [address], [address], [place], [place] [address]   If you have any questions regarding directions or hours of operation,  please call [phone].   As a reminder, please drink plenty of water prior to coming for your lab work. Thanks!   Vaccines You are taking a medication(s) that can suppress your immune system.  The following immunizations are recommended: Flu annually Covid-19  RSV Td/Tdap (tetanus,  diphtheria, pertussis) every 10 years Pneumonia (Prevnar 15 then Pneumovax 23 at least 1 year apart.  Alternatively, can take Prevnar 20 without needing additional dose) Shingrix: 2 doses from 4 weeks to 6 months apart  Please check with your PCP to make sure you are up to date.   If you have signs or symptoms of an infection or start antibiotics: First, call your PCP for workup of your infection. Hold your medication through the infection, until you complete your antibiotics, and until symptoms resolve if you take the following: Injectable medication (Actemra, Benlysta, Cimzia, Cosentyx, Enbrel, Humira, Kevzara, Orencia, Remicade, Simponi, Stelara, Taltz, Tremfya) Methotrexate Leflunomide (Arava) Mycophenolate (Cellcept) Harriette Ohara, Olumiant, or Rinvoq   Denosumab Injection (Osteoporosis) What is this medication? DENOSUMAB (den oh SUE mab) prevents and treats osteoporosis. It works by Interior and spatial designer stronger and less likely to break (fracture). It is a monoclonal antibody. This medicine may be used for other purposes; ask your health care provider or pharmacist if you have questions. COMMON BRAND NAME(S): Prolia What should I tell my care team before I take this medication? They need to know if you have any of these conditions: Dental or gum disease Had thyroid or parathyroid (glands located in neck) surgery Having dental surgery or a tooth pulled Kidney disease Low levels of calcium in the blood On dialysis Poor nutrition Thyroid disease Trouble absorbing nutrients from your food An unusual or allergic reaction to denosumab, other medications, foods,  dyes, or preservatives Pregnant or trying to get pregnant Breastfeeding How should I use this medication? This medication is injected under the skin. It is given by your care team in a hospital or clinic setting. A special MedGuide will be given to you before each treatment. Be sure to read this information carefully each  time. Talk to your care team about the use of this medication in children. Special care may be needed. Overdosage: If you think you have taken too much of this medicine contact a poison control center or emergency room at once. NOTE: This medicine is only for you. Do not share this medicine with others. What if I miss a dose? Keep appointments for follow-up doses. It is important not to miss your dose. Call your care team if you are unable to keep an appointment. What may interact with this medication? Do not take this medication with any of the following: Other medications that contain denosumab This medication may also interact with the following: Medications that lower your chance of fighting infection Steroid medications, such as prednisone or cortisone This list may not describe all possible interactions. Give your health care provider a list of all the medicines, herbs, non-prescription drugs, or dietary supplements you use. Also tell them if you smoke, drink alcohol, or use illegal drugs. Some items may interact with your medicine. What should I watch for while using this medication? Your condition will be monitored carefully while you are receiving this medication. You may need blood work done while taking this medication. This medication may increase your risk of getting an infection. Call your care team for advice if you get a fever, chills, sore throat, or other symptoms of a cold or flu. Do not treat yourself. Try to avoid being around people who are sick. Tell your dentist and dental surgeon that you are taking this medication. You should not have major dental surgery while on this medication. See your dentist to have a dental exam and fix any dental problems before starting this medication. Take good care of your teeth while on this medication. Make sure you see your dentist for regular follow-up appointments. This medication may cause low levels of calcium in your body. The risk of  severe side effects is increased in people with kidney disease. Your care team may prescribe calcium and vitamin D to help prevent low calcium levels while you take this medication. It is important to take calcium and vitamin D as directed by your care team. Talk to your care team if you may be pregnant. Serious birth defects may occur if you take this medication during pregnancy and for 5 months after the last dose. You will need a negative pregnancy test before starting this medication. Contraception is recommended while taking this medication and for 5 months after the last dose. Your care team can help you find the option that works for you. Talk to your care team before breastfeeding. Changes to your treatment plan may be needed. What side effects may I notice from receiving this medication? Side effects that you should report to your care team as soon as possible: Allergic reactions--skin rash, itching, hives, swelling of the face, lips, tongue, or throat Infection--fever, chills, cough, sore throat, wounds that don't heal, pain or trouble when passing urine, general feeling of discomfort or being unwell Low calcium level--muscle pain or cramps, confusion, tingling, or numbness in the hands or feet Osteonecrosis of the jaw--pain, swelling, or redness in the mouth, numbness of the jaw,  poor healing after dental work, unusual discharge from the mouth, visible bones in the mouth Severe bone, joint, or muscle pain Skin infection--skin redness, swelling, warmth, or pain Side effects that usually do not require medical attention (report these to your care team if they continue or are bothersome): Back pain Headache Joint pain Muscle pain Pain in the hands, arms, legs, or feet Runny or stuffy nose Sore throat This list may not describe all possible side effects. Call your doctor for medical advice about side effects. You may report side effects to FDA at 1-800-FDA-1088. Where should I keep my  medication? This medication is given in a hospital or clinic. It will not be stored at home. NOTE: This sheet is a summary. It may not cover all possible information. If you have questions about this medicine, talk to your doctor, pharmacist, or health care provider.  2024 Elsevier/Gold Standard (2022-05-03 00:00:00)

## 2023-02-10 ENCOUNTER — Telehealth: Payer: Self-pay

## 2023-02-10 ENCOUNTER — Other Ambulatory Visit: Payer: Self-pay | Admitting: Pharmacist

## 2023-02-10 LAB — VITAMIN D 25 HYDROXY (VIT D DEFICIENCY, FRACTURES): Vit D, 25-Hydroxy: 32 ng/mL (ref 30–100)

## 2023-02-10 LAB — TSH: TSH: 1.92 m[IU]/L (ref 0.40–4.50)

## 2023-02-10 LAB — PHOSPHORUS: Phosphorus: 4 mg/dL (ref 2.1–4.3)

## 2023-02-10 LAB — PARATHYROID HORMONE, INTACT (NO CA): PTH: 26 pg/mL (ref 16–77)

## 2023-02-10 NOTE — Progress Notes (Signed)
Phosphorus, vitamin D, TSH, PTH are within normal limits.  Patient should continue to take vitamin D.  Vitamin D is low normal.

## 2023-02-10 NOTE — Telephone Encounter (Signed)
[name], patient will be scheduled as soon as possible.  Auth Submission: NO AUTH NEEDED Site of care: Site of care: [facility] Payer: Medicare A/B with BCBS supplement Medication & CPT/J Code(s) submitted: Prolia (Denosumab) [id number] Route of submission (phone, fax, portal):  Phone # Fax # Auth type: Buy/Bill PB Units/visits requested: 60mg  x 1 dose Reference number:  Approval from: 02/10/23 to 05/11/22   Medicare A/B will cover 80%, BCBS supplement will cover 20%.

## 2023-02-10 NOTE — Progress Notes (Signed)
Therapy plan placed for Prolia SQ [phone]) for [facility] to start benefits investigation  Diagnosis: age-related osteoporosis  Provider:  [name]  Dose: 60mg  SQ every 6 months  Last Clinic Visit: 02/09/23 Next Clinic Visit: 05/24/22  Pertinent Labs: PTH, vitamin D, CBC, CMP stable to proceed  Chesley Mires, PharmD, MPH, BCPS, CPP Clinical Pharmacist (Rheumatology and Pulmonology)

## 2023-02-23 ENCOUNTER — Telehealth (HOSPITAL_COMMUNITY): Payer: Self-pay

## 2023-02-23 NOTE — Telephone Encounter (Signed)
No response from pt.  Closed referral  

## 2023-02-24 ENCOUNTER — Other Ambulatory Visit: Payer: Self-pay

## 2023-02-25 ENCOUNTER — Other Ambulatory Visit: Payer: Self-pay | Admitting: Physician Assistant

## 2023-02-25 DIAGNOSIS — M0609 Rheumatoid arthritis without rheumatoid factor, multiple sites: Secondary | ICD-10-CM

## 2023-02-27 ENCOUNTER — Other Ambulatory Visit (HOSPITAL_BASED_OUTPATIENT_CLINIC_OR_DEPARTMENT_OTHER): Payer: Self-pay

## 2023-02-27 MED ORDER — HYDROXYCHLOROQUINE SULFATE 200 MG PO TABS
200.0000 mg | ORAL_TABLET | Freq: Two times a day (BID) | ORAL | 0 refills | Status: DC
  Filled 2023-02-27: qty 180, 90d supply, fill #0

## 2023-02-27 NOTE — Telephone Encounter (Signed)
Last Fill: 10/04/2022  Eye exam: 05/09/2022 WNL   Labs: 01/19/2023 CBC is normal.  CMP shows mildly elevated glucose   Next Visit: 05/25/2023  Last Visit: 02/09/2023  DX: Rheumatoid arthritis of multiple sites without rheumatoid factor   Current Dose per office note 02/09/2023: hydroxychloroquine 200 mg p.o. twice daily   Okay to refill Plaquenil?

## 2023-03-04 ENCOUNTER — Other Ambulatory Visit (HOSPITAL_BASED_OUTPATIENT_CLINIC_OR_DEPARTMENT_OTHER): Payer: Self-pay

## 2023-03-14 ENCOUNTER — Ambulatory Visit (INDEPENDENT_AMBULATORY_CARE_PROVIDER_SITE_OTHER): Payer: Medicare Other

## 2023-03-14 DIAGNOSIS — I442 Atrioventricular block, complete: Secondary | ICD-10-CM

## 2023-03-15 LAB — CUP PACEART REMOTE DEVICE CHECK
Battery Remaining Longevity: 59 mo
Battery Voltage: 2.96 V
Brady Statistic AP VP Percent: 3.49 %
Brady Statistic AP VS Percent: 0 %
Brady Statistic AS VP Percent: 95.1 %
Brady Statistic AS VS Percent: 1.42 %
Brady Statistic RA Percent Paced: 3.81 %
Brady Statistic RV Percent Paced: 98.58 %
Date Time Interrogation Session: 20241202224909
Implantable Lead Connection Status: 753985
Implantable Lead Connection Status: 753985
Implantable Lead Implant Date: 20180827
Implantable Lead Implant Date: 20180827
Implantable Lead Location: 753859
Implantable Lead Location: 753860
Implantable Lead Model: 4076
Implantable Lead Model: 4076
Implantable Pulse Generator Implant Date: 20180827
Lead Channel Impedance Value: 323 Ohm
Lead Channel Impedance Value: 361 Ohm
Lead Channel Impedance Value: 399 Ohm
Lead Channel Impedance Value: 437 Ohm
Lead Channel Pacing Threshold Amplitude: 0.625 V
Lead Channel Pacing Threshold Amplitude: 0.75 V
Lead Channel Pacing Threshold Pulse Width: 0.4 ms
Lead Channel Pacing Threshold Pulse Width: 0.4 ms
Lead Channel Sensing Intrinsic Amplitude: 13.375 mV
Lead Channel Sensing Intrinsic Amplitude: 13.375 mV
Lead Channel Sensing Intrinsic Amplitude: 4.625 mV
Lead Channel Sensing Intrinsic Amplitude: 4.625 mV
Lead Channel Setting Pacing Amplitude: 1.5 V
Lead Channel Setting Pacing Amplitude: 2.5 V
Lead Channel Setting Pacing Pulse Width: 0.4 ms
Lead Channel Setting Sensing Sensitivity: 4 mV
Zone Setting Status: 755011

## 2023-03-22 ENCOUNTER — Ambulatory Visit: Payer: Medicare Other

## 2023-03-24 ENCOUNTER — Other Ambulatory Visit (HOSPITAL_BASED_OUTPATIENT_CLINIC_OR_DEPARTMENT_OTHER): Payer: Self-pay

## 2023-03-24 ENCOUNTER — Other Ambulatory Visit: Payer: Self-pay | Admitting: Rheumatology

## 2023-03-24 DIAGNOSIS — Z79899 Other long term (current) drug therapy: Secondary | ICD-10-CM

## 2023-03-24 DIAGNOSIS — M0609 Rheumatoid arthritis without rheumatoid factor, multiple sites: Secondary | ICD-10-CM

## 2023-03-24 MED ORDER — METHOTREXATE SODIUM 2.5 MG PO TABS
15.0000 mg | ORAL_TABLET | ORAL | 0 refills | Status: DC
  Filled 2023-03-24: qty 72, 84d supply, fill #0

## 2023-03-24 NOTE — Telephone Encounter (Signed)
Last Fill: 01/04/2023  Labs: 01/19/2023 CBC is normal.  CMP shows mildly elevated glucose probably not a fasting sample.   Next Visit: 05/25/2023  Last Visit: 02/09/2023  DX: Rheumatoid arthritis of multiple sites without rheumatoid factor   Current Dose per office note 02/09/2023: MTX 6 tabs po once weekly   Okay to refill Methotrexate?

## 2023-03-25 ENCOUNTER — Other Ambulatory Visit (HOSPITAL_BASED_OUTPATIENT_CLINIC_OR_DEPARTMENT_OTHER): Payer: Self-pay

## 2023-03-28 ENCOUNTER — Ambulatory Visit (HOSPITAL_COMMUNITY)
Admission: RE | Admit: 2023-03-28 | Discharge: 2023-03-28 | Disposition: A | Discharge status: Home / Self Care | Payer: Medicare Other | Source: Ambulatory Visit | Attending: Neurology | Admitting: Neurology

## 2023-03-28 DIAGNOSIS — G4731 Primary central sleep apnea: Secondary | ICD-10-CM

## 2023-03-28 DIAGNOSIS — M0609 Rheumatoid arthritis without rheumatoid factor, multiple sites: Secondary | ICD-10-CM

## 2023-03-28 DIAGNOSIS — I442 Atrioventricular block, complete: Secondary | ICD-10-CM | POA: Insufficient documentation

## 2023-03-28 DIAGNOSIS — G629 Polyneuropathy, unspecified: Secondary | ICD-10-CM | POA: Insufficient documentation

## 2023-03-28 DIAGNOSIS — R490 Dysphonia: Secondary | ICD-10-CM

## 2023-03-28 DIAGNOSIS — R1312 Dysphagia, oropharyngeal phase: Secondary | ICD-10-CM

## 2023-03-28 DIAGNOSIS — R269 Unspecified abnormalities of gait and mobility: Secondary | ICD-10-CM

## 2023-04-03 ENCOUNTER — Encounter (HOSPITAL_BASED_OUTPATIENT_CLINIC_OR_DEPARTMENT_OTHER): Payer: Self-pay | Admitting: Pulmonary Disease

## 2023-04-03 ENCOUNTER — Ambulatory Visit (HOSPITAL_BASED_OUTPATIENT_CLINIC_OR_DEPARTMENT_OTHER): Payer: Medicare Other | Admitting: Pulmonary Disease

## 2023-04-03 VITALS — BP 112/56 | HR 88 | Resp 16 | Ht 72.25 in | Wt 151.4 lb

## 2023-04-03 DIAGNOSIS — R5383 Other fatigue: Secondary | ICD-10-CM | POA: Diagnosis not present

## 2023-04-03 DIAGNOSIS — R0602 Shortness of breath: Secondary | ICD-10-CM | POA: Diagnosis not present

## 2023-04-03 LAB — PULMONARY FUNCTION TEST
DL/VA % pred: 137 %
DL/VA: 5.2 ml/min/mmHg/L
DLCO cor % pred: 111 %
DLCO cor: 28.27 ml/min/mmHg
DLCO unc % pred: 111 %
DLCO unc: 28.27 ml/min/mmHg
FEF 25-75 Post: 2.31 L/s
FEF 25-75 Pre: 2.29 L/s
FEF2575-%Change-Post: 0 %
FEF2575-%Pred-Post: 122 %
FEF2575-%Pred-Pre: 121 %
FEV1-%Change-Post: 0 %
FEV1-%Pred-Post: 102 %
FEV1-%Pred-Pre: 102 %
FEV1-Post: 2.98 L
FEV1-Pre: 2.98 L
FEV1FVC-%Change-Post: 4 %
FEV1FVC-%Pred-Pre: 107 %
FEV6-%Change-Post: -4 %
FEV6-%Pred-Post: 97 %
FEV6-%Pred-Pre: 101 %
FEV6-Post: 3.75 L
FEV6-Pre: 3.91 L
FEV6FVC-%Change-Post: 0 %
FEV6FVC-%Pred-Post: 106 %
FEV6FVC-%Pred-Pre: 106 %
FVC-%Change-Post: -4 %
FVC-%Pred-Post: 90 %
FVC-%Pred-Pre: 95 %
FVC-Post: 3.77 L
FVC-Pre: 3.95 L
Post FEV1/FVC ratio: 79 %
Post FEV6/FVC ratio: 99 %
Pre FEV1/FVC ratio: 75 %
Pre FEV6/FVC Ratio: 99 %
RV % pred: 151 %
RV: 4.4 L
TLC % pred: 94 %
TLC: 7.13 L

## 2023-04-03 NOTE — Patient Instructions (Signed)
Full PFT Performed today

## 2023-04-03 NOTE — Progress Notes (Signed)
Full PFT Performed today

## 2023-04-03 NOTE — Patient Instructions (Signed)
--  Reviewed PFTs which are normal. No significant lung disease. --No bronchodilators indicated

## 2023-04-03 NOTE — Progress Notes (Signed)
Subjective:   PATIENT ID: Jeffery [name] GENDER: male DOB: 08-19-1934, MRN: [id number]  Chief Complaint  Patient presents with   Follow-up    FU after PFT    Reason for Visit: Follow-up  Mr. Stoney O'Tuel is an 88 year old male with past medical history of seronegative rheumatoid arthritis, complete heart block s/p pacemaker implamantation, who presents with a four month history of excessive daytime fatigue. He states that in the summer (June 2024), he had worsening shortness of breath on exertion, and was told he had multiple bouts of COVID (however no evidence of this). The shortness of breath has resolved, however the fatigue has not. Previously, before he got sick in June, he was very active and did not need any walking aid. Since then, he has needed to use a cane, which progressed to walker, which has now progressed to rollater. He has had quite an extensive workup involving multiple disciplinary teams, including neuro, rheumatology, cardiology, however no cause of fatigue has been able to be addressed. Of note, he does have a history of sleep apnea for which he has not been using his CPAP (coincidentally at the same time of symptom onset).    04/03/23 Since our last visit he reports shortness of breath has improved without intervention. He has completed PFTs and here to discuss results. Continues to have some fatigue. Compliant with CPAP.  Social History: Remote history of cigarette use  Environmental exposures: NA  Past Medical History:  Diagnosis Date   Arthritis    Autoimmune disorder (HCC)    BCC (basal cell carcinoma)    BPH (benign prostatic hypertrophy) 2008   Cancer [facility]) 2008   Prostate   Cervical stenosis of spine    per patient    Complete heart block (HCC)    Complex sleep apnea syndrome    does not use CPAP   Diabetes mellitus without complication (HCC)    Diverticulosis    ED (erectile dysfunction)    Gastritis    GERD (gastroesophageal reflux  disease)    Hemorrhoids    Hernia, inguinal    Hyperlipidemia    IFG (impaired fasting glucose)    Osteopenia    Peptic ulcer disease with hemorrhage 1993   Shingles    Sleep apnea    Vitamin D deficiency      Family History  Problem Relation Age of Onset   Heart failure Mother        Died age 63.  Apparently had valve surgery.    Congenital heart disease Brother    Stroke Maternal Grandfather    Colon cancer Neg Hx    Stomach cancer Neg Hx    Esophageal cancer Neg Hx    Rectal cancer Neg Hx      Social History   Occupational History   Not on file  Tobacco Use   Smoking status: Never    Passive exposure: Never   Smokeless tobacco: Never  Vaping Use   Vaping status: Never Used  Substance and Sexual Activity   Alcohol use: Not Currently   Drug use: No   Sexual activity: Not on file    Allergies  Allergen Reactions   Sulfasalazine Anaphylaxis   Demerol [Meperidine] Nausea Only   Garlic    Other     SHALLOTS   Sulfa Antibiotics      Outpatient Medications Prior to Visit  Medication Sig Dispense Refill   acetaminophen (TYLENOL) 500 MG tablet Take 500 mg by mouth  every 6 (six) hours as needed.     Alpha-Lipoic Acid 600 MG TABS One a day before lunch or breakfast     calcium carbonate (OS-CAL) 1250 (500 Ca) MG chewable tablet Chew 1 tablet by mouth daily.     cholecalciferol (VITAMIN D3) 25 MCG (1000 UNIT) tablet Take 1,000 Units by mouth daily.     clonazePAM (KLONOPIN) 0.5 MG tablet Take 1 tablet (0.5 mg total) by mouth at bedtime as needed. 30 tablet 2   Coenzyme Q10 100 MG TABS Take by mouth daily.     diclofenac Sodium (VOLTAREN) 1 % GEL Apply 2-4 grams to affected joint 4 times daily as needed. 400 g 2   folic acid (FOLVITE) 1 MG tablet Take 1 tablet (1 mg total) by mouth daily. 90 tablet 3   glucose blood (ONETOUCH ULTRA TEST) test strip Check blood sugar 2 times daily 200 each 3   hydroxychloroquine (PLAQUENIL) 200 MG tablet Take 1 tablet (200 mg total)  by mouth 2 (two) times daily. 180 tablet 0   Lancets (ONETOUCH DELICA PLUS LANCET33G) MISC Check blood sugar two times a day. 200 each 3   metFORMIN (GLUCOPHAGE) 500 MG tablet Take 1 tablet (500 mg total) by mouth daily. 90 tablet 4   methotrexate (RHEUMATREX) 2.5 MG tablet Take 6 tablets (15 mg total) by mouth once a week. 72 tablet 0   ONE TOUCH ULTRA TEST test strip TEST BLOOD SUGAR BID AS DIRECTED  2   vitamin E 100 UNIT capsule Take 100 Units by mouth daily.     No facility-administered medications prior to visit.    Review of Systems  Constitutional:  Positive for malaise/fatigue. Negative for chills, diaphoresis, fever and weight loss.  HENT:  Negative for congestion.   Respiratory:  Negative for cough, hemoptysis, sputum production, shortness of breath and wheezing.   Cardiovascular:  Negative for chest pain, palpitations and leg swelling.     Objective:   Vitals:   04/03/23 1440  BP: (!) 112/56  Pulse: 88  Resp: 16  SpO2: 96%  Weight: 151 lb 6.4 oz (68.7 kg)  Height: 6' 0.25" (1.835 m)   SpO2: 96 %  Physical Exam: General: Well-appearing, no acute distress HENT: [place], AT Eyes: EOMI, no scleral icterus Respiratory: Clear to auscultation bilaterally.  No crackles, wheezing or rales Cardiovascular: RRR, -M/R/G, no JVD Extremities:-Edema,-tenderness Neuro: AAO x4, CNII-XII grossly intact Psych: Normal mood, normal affect   Data Reviewed:  Imaging: CT Chest 06/30/22 - Stable biapical pleuroparenchymal scarring, unchanged. Stable pulmonary nodules including 8 mm left apical nodule compared to 06/29/21  PFT: 04/08/23 FVC 3.77 (90%) FEV1 2.98 (102%) Ratio 79  TLC 94% DLCO 111% Interpretation: Normal PFTs  Labs:     Latest Ref Rng & Units 01/19/2023    2:49 PM 05/19/2022    1:37 PM 01/27/2022    3:47 PM  CBC  WBC 3.8 - 10.8 Thousand/uL 4.7  4.0  4.6   Hemoglobin 13.2 - 17.1 g/dL 95.2  84.1  32.4   Hematocrit 38.5 - 50.0 % 43.8  41.6  43.1   Platelets 140 - 400  Thousand/uL 254  229  241        Latest Ref Rng & Units 01/19/2023    2:49 PM 05/19/2022    1:37 PM 01/27/2022    3:47 PM  BMP  Glucose 65 - 99 mg/dL 401  027  253   BUN 7 - 25 mg/dL 15  14  18    Creatinine  0.70 - 1.22 mg/dL 5.78  4.69  6.29   BUN/Creat Ratio 6 - 22 (calc) SEE NOTE:  SEE NOTE:  SEE NOTE:   Sodium 135 - 146 mmol/L 141  140  139   Potassium 3.5 - 5.3 mmol/L 4.9  5.3  4.6   Chloride 98 - 110 mmol/L 98  99  100   CO2 20 - 32 mmol/L 35  33  34   Calcium 8.6 - 10.3 mg/dL 9.8  9.7  9.7          Assessment & Plan:   Discussion: Mr. Derreck Fortuna is an 88 year old male with past medical history of seronegative rheumatoid arthritis, complete heart block s/p pacemaker implantation who presents with a four month history of excessive daytime fatigue and shortness of breath. Shortness of breath has improved.  #Excessive Daytime Fatigue  --Reviewed PFTs which are normal. No significant lung disease. --No bronchodilators indicated --Encourage CPAP Use  --Currently in PT/OT  #Lung Nodules - stable CT imaging revealed stable biapical pleuroparenchymal scarring, could be related to patient's rheumatoid arthritis. Given no current respiratory symptoms, will hold off on repeat imaging.    Health Maintenance Immunization History  Administered Date(s) Administered   Influenza, High Dose Seasonal PF 03/03/2017, 01/26/2018   Influenza-Unspecified 01/16/2023   Moderna SARS-COV2 Booster Vaccination 10/15/2020   Moderna Sars-Covid-2 Vaccination 04/13/2019, 05/14/2019, 12/12/2019   Pfizer(Comirnaty)Fall Seasonal Vaccine 12 years and older 03/17/2022   No orders of the defined types were placed in this encounter.  No orders of the defined types were placed in this encounter.   Return if symptoms worsen or fail to improve.  I have spent a total time of 30-minutes on the day of the appointment including chart review, data review, collecting history, coordinating care and discussing  medical diagnosis and plan with the patient/family. Past medical history, allergies, medications were reviewed. Pertinent imaging, labs and tests included in this note have been reviewed and interpreted independently by me.  Tenise Stetler Mechele Collin, MD [facility] 04/03/2023 3:14 PM

## 2023-04-08 ENCOUNTER — Encounter (HOSPITAL_BASED_OUTPATIENT_CLINIC_OR_DEPARTMENT_OTHER): Payer: Self-pay | Admitting: Pulmonary Disease

## 2023-04-14 ENCOUNTER — Telehealth: Payer: Self-pay | Admitting: Neurology

## 2023-04-14 DIAGNOSIS — G629 Polyneuropathy, unspecified: Secondary | ICD-10-CM

## 2023-04-14 DIAGNOSIS — R269 Unspecified abnormalities of gait and mobility: Secondary | ICD-10-CM

## 2023-04-14 DIAGNOSIS — M48061 Spinal stenosis, lumbar region without neurogenic claudication: Secondary | ICD-10-CM | POA: Insufficient documentation

## 2023-04-14 NOTE — Telephone Encounter (Signed)
 Please call patient.  MRI of lumbar spine showed severe L4-5 stenosis, which consistent with EMG/NCS findings and his worsening distal weakness, gait abnormalities.  MRI of cervical spine showed muti-level degenerative changes. There was no cord compression.  He is a potential surgical candidate.    IMPRESSION: 1. C3-4: Left foraminal narrowing that could affect the left C4 nerve. 2. C4-5: Bilateral foraminal narrowing left worse than right. Either C5 nerve could be affected. 3. C5-6: Chronic fusion of the facet joints on the left. Chronic bony foraminal narrowing on the left, but not likely compressive. 4. C7-T1: Bilateral foraminal narrowing, right more than left. IMPRESSION: 1. L4-5: Severe multifactorial stenosis that could cause neural compression on either or both sides. Some worsening since 2021. 2. L5-S1: Distant decompression, diskectomy and fusion. Solid fusion at this level with sufficient patency of the canal and foramina. 3. Cholelithiasis.   Orders Placed This Encounter  Procedures   Ambulatory referral to Neurosurgery

## 2023-04-17 ENCOUNTER — Telehealth: Payer: Self-pay | Admitting: Neurology

## 2023-04-17 NOTE — Telephone Encounter (Signed)
 Referral for neurosurgery fax to [facility]. Phone: [phone], Fax: [phone]

## 2023-04-18 NOTE — Telephone Encounter (Signed)
 Called and reviewed the information with the patient. He verbalized understanding and agreeable to referral to NS. Previously he had seen Dr Venetia Maxon but he no longer is practicing. Will send a NS referral for the patient

## 2023-04-20 ENCOUNTER — Other Ambulatory Visit (HOSPITAL_BASED_OUTPATIENT_CLINIC_OR_DEPARTMENT_OTHER): Payer: Self-pay

## 2023-04-21 ENCOUNTER — Other Ambulatory Visit (HOSPITAL_BASED_OUTPATIENT_CLINIC_OR_DEPARTMENT_OTHER): Payer: Self-pay

## 2023-04-21 ENCOUNTER — Other Ambulatory Visit: Payer: Self-pay

## 2023-04-21 MED ORDER — CLONAZEPAM 0.5 MG PO TABS
0.5000 mg | ORAL_TABLET | Freq: Every evening | ORAL | 2 refills | Status: DC | PRN
  Filled 2023-04-21: qty 30, 30d supply, fill #0
  Filled 2023-05-26: qty 30, 30d supply, fill #1
  Filled 2023-06-30: qty 30, 30d supply, fill #2

## 2023-05-09 ENCOUNTER — Other Ambulatory Visit (HOSPITAL_BASED_OUTPATIENT_CLINIC_OR_DEPARTMENT_OTHER): Payer: Self-pay

## 2023-05-09 MED ORDER — GABAPENTIN 100 MG PO CAPS
100.0000 mg | ORAL_CAPSULE | Freq: Every day | ORAL | 0 refills | Status: DC
  Filled 2023-05-09: qty 30, 30d supply, fill #0

## 2023-05-10 ENCOUNTER — Other Ambulatory Visit (HOSPITAL_BASED_OUTPATIENT_CLINIC_OR_DEPARTMENT_OTHER): Payer: Self-pay

## 2023-05-11 NOTE — Progress Notes (Signed)
Office Visit Note  Patient: Jeffery [name]             Date of Birth: 24-May-1934           MRN: [id number]             PCP: Creola Corn, MD Referring: Creola Corn, MD Visit Date: 05/25/2023 Occupation: @GUAROCC @  Subjective:  Medication management  History of Present Illness: Jeffery [name] is a 89 y.o. male with seropositive rheumatoid arthritis, polymyalgia rheumatica, osteoarthritis, degenerative disc disease and osteoporosis.  He states that he has been doing exercises on a regular basis which were given by physical therapy and gradually improving.  He has been ambulating with the help of a rollator walker.  He states he continues to have discomfort in his neck and lower back.  He was evaluated by Dr. Cheree Ditto and had MRI of the cervical spine and lumbar spine.  He was advised epidural injection for the lumbar spine as needed.  He states his muscular weakness and tenderness is gradually improving.  He was also evaluated by pulmonologist and nothing else was suggested.  He states recently he has noticed some changes in his bilateral index fingers.  He has not noticed any joint swelling.  His eye examination is a schedule in the next month.  He is ready to start Prolia this month.  He has been taking calcium and vitamin D.    Activities of Daily Living:  Patient reports morning stiffness for 1 hour.   Patient Reports nocturnal pain.  Difficulty dressing/grooming: Denies Difficulty climbing stairs: Reports Difficulty getting out of chair: Reports Difficulty using hands for taps, buttons, cutlery, and/or writing: Denies  Review of Systems  Constitutional:  Positive for fatigue.  HENT:  Positive for mouth dryness. Negative for mouth sores and nose dryness.   Eyes:  Negative for pain and dryness.  Respiratory:  Negative for shortness of breath and difficulty breathing.   Cardiovascular:  Negative for chest pain and palpitations.  Gastrointestinal:  Negative for blood in stool,  constipation and diarrhea.  Endocrine: Negative for increased urination.  Genitourinary:  Negative for involuntary urination.  Musculoskeletal:  Positive for joint pain, gait problem, joint pain, joint swelling, myalgias, muscle weakness, morning stiffness, muscle tenderness and myalgias.  Skin:  Negative for color change, rash, hair loss and sensitivity to sunlight.  Allergic/Immunologic: Negative for susceptible to infections.  Neurological:  Negative for dizziness and headaches.  Hematological:  Negative for swollen glands.  Psychiatric/Behavioral:  Negative for depressed mood and sleep disturbance. The patient is not nervous/anxious.     PMFS History:  Patient Active Problem List   Diagnosis Date Noted   Spinal stenosis of lumbar region 04/14/2023   Shortness of breath 01/23/2023   PVC's (premature ventricular contractions) 12/12/2022   Ptosis of left eyelid 12/07/2022   Slurred speech 12/07/2022   Gait abnormality 12/07/2022   Vitreomacular adhesion, right 08/16/2021   Macular retinoschisis of right eye 08/16/2021   Pulmonary nodules 07/04/2021   Essential hypertension 07/04/2021   Sleep apnea 06/28/2020   Dyslipidemia 06/28/2020   Memory changes 04/16/2020   Pacemaker ECG pattern 09/20/2019   Treatment-emergent central sleep apnea 09/20/2019   Round hole of right eye 08/26/2019   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/26/2019   Cystoid macular edema of right eye 08/26/2019   Pseudophakia of both eyes 08/26/2019   Obstructive sleep apnea of adult 08/26/2019   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) ventilation due to central  sleep apnea 08/24/2019   Educated about COVID-19 virus infection 10/08/2018   Aortic root dilatation (HCC) 10/08/2018   Mitral annular calcification 10/08/2018   Neuropathy 08/21/2017   Hydroxychloroquine causing adverse effect in therapeutic use 08/21/2017   Symptomatic bradycardia 02/23/2017   Status cardiac pacemaker 02/23/2017    CSA (central sleep apnea) 02/23/2017   Heart block AV complete (HCC) 01/18/2017   Vitamin D deficiency 02/08/2016   Trigger finger, left middle finger 02/08/2016   Long-term use of Plaquenil 02/08/2016   Bursitis of right shoulder 02/08/2016   DDD (degenerative disc disease), lumbar 02/08/2016   DDD (degenerative disc disease), thoracic 02/08/2016   Rheumatoid arthritis of multiple sites without rheumatoid factor (HCC) 11/28/2015    Class: Chronic   Other secondary osteoarthritis of both hands 11/28/2015    Class: Chronic   PMR (polymyalgia rheumatica) (HCC) 11/28/2015    Class: Chronic   Osteoporosis 11/28/2015    Class: Chronic   Prostate cancer (HCC) 11/28/2015    Class: History of   Duodenal ulcer 11/28/2015    Class: Chronic   High cholesterol 11/28/2015   Dizziness 07/16/2015   Incomplete RBBB 03/31/2015   Sleep apnea, primary central 03/03/2015   Polyneuropathy (HCC) 01/24/2013   Right inguinal hernia 07/05/2012    Past Medical History:  Diagnosis Date   Arthritis    Autoimmune disorder (HCC)    BCC (basal cell carcinoma)    BPH (benign prostatic hypertrophy) 2008   Cancer [facility]) 2008   Prostate   Cervical stenosis of spine    per patient    Complete heart block (HCC)    Complex sleep apnea syndrome    does not use CPAP   Diabetes mellitus without complication (HCC)    Diverticulosis    ED (erectile dysfunction)    Gastritis    GERD (gastroesophageal reflux disease)    Hemorrhoids    Hernia, inguinal    Hyperlipidemia    IFG (impaired fasting glucose)    Osteopenia    Peptic ulcer disease with hemorrhage 1993   Shingles    Sleep apnea    Vitamin D deficiency     Family History  Problem Relation Age of Onset   Heart failure Mother        Died age 85.  Apparently had valve surgery.    Congenital heart disease Brother    Stroke Maternal Grandfather    Colon cancer Neg Hx    Stomach cancer Neg Hx    Esophageal cancer Neg Hx    Rectal cancer Neg Hx     Past Surgical History:  Procedure Laterality Date   BACK SURGERY N/A 1984   barium swallow study  2023   x2   CATARACT EXTRACTION, BILATERAL     HERNIA REPAIR     LAMINECTOMY  1984   L 5   PACEMAKER IMPLANT  12/05/2016   MDT Azure XT DR MRI conditional PPM implanted for CHB by Dr Barnie Mort in [place]   PACEMAKER INSERTION  12/05/2016   PROSTATE SURGERY  12/2006   Robotic   TONSILLECTOMY  89 years old   TRIGGER FINGER RELEASE  03/01/2012   Procedure: MINOR RELEASE TRIGGER FINGER/A-1 PULLEY;  Surgeon: [name], MD;  Location: [facility];  Service: Orthopedics;  Laterality: Left;  long finger   TRIGGER FINGER RELEASE Left 02/08/2018   Procedure: RELEASE TRIGGER FINGER/A-1 PULLEY LEFT RING FINGER;  Surgeon: Cindee Salt, MD;  Location: [facility];  Service: Orthopedics;  Laterality:  Left;   UPPER GI ENDOSCOPY  2023   Social History   Social History Narrative   Right handed, Retired .Married, 2 kids. Caffeine none.   3 Step    Immunization History  Administered Date(s) Administered   Influenza, High Dose Seasonal PF 03/03/2017, 01/26/2018   Influenza-Unspecified 01/16/2023   Moderna SARS-COV2 Booster Vaccination 10/15/2020   Moderna Sars-Covid-2 Vaccination 04/13/2019, 05/14/2019, 12/12/2019   Pfizer(Comirnaty)Fall Seasonal Vaccine 12 years and older 03/17/2022     Objective: Vital Signs: BP 119/72 (BP Location: Left Arm, Patient Position: Sitting, Cuff Size: Normal)   Pulse 77   Resp 14   Ht 6\' 1"  (1.854 m)   Wt 156 lb 6.4 oz (70.9 kg)   BMI 20.63 kg/m    Physical Exam Vitals and nursing note reviewed.  Constitutional:      Appearance: He is well-developed.  HENT:     Head: Normocephalic and atraumatic.  Eyes:     Conjunctiva/sclera: Conjunctivae normal.     Pupils: Pupils are equal, round, and reactive to light.  Cardiovascular:     Rate and Rhythm: Normal rate and regular rhythm.     Heart sounds: Normal heart sounds.   Pulmonary:     Effort: Pulmonary effort is normal.     Breath sounds: Normal breath sounds.  Abdominal:     General: Bowel sounds are normal.     Palpations: Abdomen is soft.  Musculoskeletal:     Cervical back: Normal range of motion and neck supple.  Skin:    General: Skin is warm and dry.     Capillary Refill: Capillary refill takes less than 2 seconds.  Neurological:     Mental Status: He is alert and oriented to person, place, and time.  Psychiatric:        Behavior: Behavior normal.      Musculoskeletal Exam: He had limited lateral rotation of the cervical spine.  He had no tenderness over thoracic region.  He had painful limited range of motion of the lumbar spine.  Shoulder joints, elbow joints, wrist joints were in good range of motion.  MCP thickening with no synovitis was noted.  He had bilateral PIP and DIP thickening.  Subluxation of bilateral first DIP joints was noted without any swelling.  Hip joints and knee joints were in good range of motion without any warmth swelling or effusion.  There was no tenderness over ankles or MTPs.  CDAI Exam: CDAI Score: -- Patient Global: --; Provider Global: -- Swollen: --; Tender: -- Joint Exam 05/25/2023   No joint exam has been documented for this visit   There is currently no information documented on the homunculus. Go to the Rheumatology activity and complete the homunculus joint exam.  Investigation: No additional findings.  Imaging: No results found.  Recent Labs: Lab Results  Component Value Date   WBC 5.1 05/18/2023   HGB 14.0 05/18/2023   PLT 242 05/18/2023   NA 140 05/18/2023   K 5.0 05/18/2023   CL 99 05/18/2023   CO2 31 05/18/2023   GLUCOSE 94 05/18/2023   BUN 14 05/18/2023   CREATININE 0.66 (L) 05/18/2023   BILITOT 0.7 05/18/2023   ALKPHOS 85 03/19/2019   AST 9 (L) 05/18/2023   ALT 14 05/18/2023   PROT 7.0 05/18/2023   ALBUMIN 4.7 (H) 03/19/2019   CALCIUM 9.8 05/18/2023   GFRAA 102 08/10/2020     Speciality Comments: PLQ Eye Exam: 05/09/2022 WNL @ Dr. Nile Riggs (in [facility]). Follow up in 1 year  Procedures:  No procedures performed Allergies: Sulfasalazine, Demerol [meperidine], Garlic, Other, and Sulfa antibiotics   Assessment / Plan:     Visit Diagnoses: Rheumatoid arthritis of multiple sites without rheumatoid factor (HCC) - RF negative, CCP negative, 1433 eta negative, ANA negative. -Patient denies any joint swelling.  Although has been having increasing stiffness in his bilateral index fingers.  Bilateral DIP thickening of the index finger was noted without any synovitis.  Assured patient that he does not rheumatoid arthritis flare.  Plan: hydroxychloroquine (PLAQUENIL) 200 MG tablet, diclofenac Sodium (VOLTAREN) 1 % GEL  High risk medication use - MTX 6 tabs po once weekly, folic acid 1 mg po daily, PLQ 200 mg BID. PLQ Eye Exam: 05/09/2022.  May 18, 2023 CBC and CMP were normal.  Patient was advised to get labs every 3 months.  Information on immunization was placed in the AVS.  PMR (polymyalgia rheumatica) (HCC) - He has been off prednisone for several years now.  Primary osteoarthritis of both hands-he has severe osteoarthritis in bilateral hands.  Subluxation of bilateral index finger DIP joints was noted.  Patient states that happened after an incidence.  Joint protection was discussed.  DDD (degenerative disc disease), cervical-he was recently evaluated by Dr. Wynetta Emery for ongoing cervical spine discomfort.  MRI of the cervical spine was obtained.  He has been doing some exercises.  DDD (degenerative disc disease), thoracic-he denies any discomfort today.  Spondylosis of lumbar spine - He has been ambulating with the help of a walker due to unstable gait.  He has been experiencing lower back pain.  MRI of the lumbar spine was obtained recently which showed degenerative disc disease and spinal stenosis.  Patient states Dr. Cheree Ditto prescribed gabapentin which has been  helpful.  He also recommended future epidural injections.  Shortness of breath - patient was recently evaluated by his cardiologist and pulmonologist.  I reviewed the pulmonology report from April 03, 2023.  PFTs were normal.  He was advised to use CPAP.  Cardiology workup was negative.  Other fatigue-his fatigue is improved.  Age-related osteoporosis without current pathological fracture - 03/21 T -1.9.  January 16, 2023 The BMD measured at [facility] is 0.748 g/cm2 with aT-score of -2.5.  Patient will be getting his first Prolia injection this month.  Need for taking calcium and vitamin D on a regular basis was discussed.  Vitamin D deficiency -vitamin D was 36 in October 2024.  He has been taking vitamin D 2000 units daily.  Will recheck vitamin D today.  Plan: VITAMIN D 25 Hydroxy (Vit-D Deficiency, Fractures)  Gait instability-he ambulates with the help of a rollator walker.  History of prostate cancer  Heart block AV complete (HCC) - he has a pacemaker.  History of diabetes mellitus - he is prediabetic.  He is on metformin.  He is followed by [name].  History of sleep apnea  Orders: Orders Placed This Encounter  Procedures   VITAMIN D 25 Hydroxy (Vit-D Deficiency, Fractures)   Meds ordered this encounter  Medications   hydroxychloroquine (PLAQUENIL) 200 MG tablet    Sig: Take 1 tablet (200 mg total) by mouth 2 (two) times daily.    Dispense:  180 tablet    Refill:  0   diclofenac Sodium (VOLTAREN) 1 % GEL    Sig: Apply 2-4 grams to affected joint 4 times daily as needed.    Dispense:  400 g    Refill:  2     Follow-Up Instructions: Return  in about 5 months (around 10/22/2023) for Rheumatoid arthritis, Osteoarthritis, Osteoporosis.   Pollyann Savoy, MD  Note - This record has been created using Animal nutritionist.  Chart creation errors have been sought, but may not always  have been located. Such creation errors do not reflect on  the standard of  medical care.

## 2023-05-18 ENCOUNTER — Other Ambulatory Visit: Payer: Self-pay

## 2023-05-18 ENCOUNTER — Other Ambulatory Visit: Payer: Self-pay | Admitting: *Deleted

## 2023-05-18 DIAGNOSIS — Z79899 Other long term (current) drug therapy: Secondary | ICD-10-CM

## 2023-05-19 LAB — CBC WITH DIFFERENTIAL/PLATELET
Absolute Lymphocytes: 1443 {cells}/uL (ref 850–3900)
Absolute Monocytes: 515 {cells}/uL (ref 200–950)
Basophils Absolute: 10 {cells}/uL (ref 0–200)
Basophils Relative: 0.2 %
Eosinophils Absolute: 133 {cells}/uL (ref 15–500)
Eosinophils Relative: 2.6 %
HCT: 41.8 % (ref 38.5–50.0)
Hemoglobin: 14 g/dL (ref 13.2–17.1)
MCH: 32.3 pg (ref 27.0–33.0)
MCHC: 33.5 g/dL (ref 32.0–36.0)
MCV: 96.3 fL (ref 80.0–100.0)
MPV: 9.6 fL (ref 7.5–12.5)
Monocytes Relative: 10.1 %
Neutro Abs: 2999 {cells}/uL (ref 1500–7800)
Neutrophils Relative %: 58.8 %
Platelets: 242 10*3/uL (ref 140–400)
RBC: 4.34 10*6/uL (ref 4.20–5.80)
RDW: 12.2 % (ref 11.0–15.0)
Total Lymphocyte: 28.3 %
WBC: 5.1 10*3/uL (ref 3.8–10.8)

## 2023-05-19 LAB — COMPLETE METABOLIC PANEL WITH GFR
AG Ratio: 1.8 (calc) (ref 1.0–2.5)
ALT: 14 U/L (ref 9–46)
AST: 9 U/L — ABNORMAL LOW (ref 10–35)
Albumin: 4.5 g/dL (ref 3.6–5.1)
Alkaline phosphatase (APISO): 67 U/L (ref 35–144)
BUN/Creatinine Ratio: 21 (calc) (ref 6–22)
BUN: 14 mg/dL (ref 7–25)
CO2: 31 mmol/L (ref 20–32)
Calcium: 9.8 mg/dL (ref 8.6–10.3)
Chloride: 99 mmol/L (ref 98–110)
Creat: 0.66 mg/dL — ABNORMAL LOW (ref 0.70–1.22)
Globulin: 2.5 g/dL (ref 1.9–3.7)
Glucose, Bld: 94 mg/dL (ref 65–99)
Potassium: 5 mmol/L (ref 3.5–5.3)
Sodium: 140 mmol/L (ref 135–146)
Total Bilirubin: 0.7 mg/dL (ref 0.2–1.2)
Total Protein: 7 g/dL (ref 6.1–8.1)
eGFR: 91 mL/min/{1.73_m2} (ref >=60)

## 2023-05-19 NOTE — Progress Notes (Signed)
 CBC and CMP are normal.

## 2023-05-22 ENCOUNTER — Other Ambulatory Visit (HOSPITAL_BASED_OUTPATIENT_CLINIC_OR_DEPARTMENT_OTHER): Payer: Self-pay

## 2023-05-23 ENCOUNTER — Encounter: Payer: Self-pay | Admitting: Rheumatology

## 2023-05-25 ENCOUNTER — Encounter: Payer: Self-pay | Admitting: Rheumatology

## 2023-05-25 ENCOUNTER — Other Ambulatory Visit (HOSPITAL_BASED_OUTPATIENT_CLINIC_OR_DEPARTMENT_OTHER): Payer: Self-pay

## 2023-05-25 ENCOUNTER — Ambulatory Visit: Payer: Medicare Other | Attending: Rheumatology | Admitting: Rheumatology

## 2023-05-25 VITALS — BP 119/72 | HR 77 | Resp 14 | Ht 73.0 in | Wt 156.4 lb

## 2023-05-25 DIAGNOSIS — M47816 Spondylosis without myelopathy or radiculopathy, lumbar region: Secondary | ICD-10-CM | POA: Insufficient documentation

## 2023-05-25 DIAGNOSIS — Z8546 Personal history of malignant neoplasm of prostate: Secondary | ICD-10-CM | POA: Diagnosis present

## 2023-05-25 DIAGNOSIS — M5030 Other cervical disc degeneration, unspecified cervical region: Secondary | ICD-10-CM | POA: Insufficient documentation

## 2023-05-25 DIAGNOSIS — I442 Atrioventricular block, complete: Secondary | ICD-10-CM | POA: Insufficient documentation

## 2023-05-25 DIAGNOSIS — M19042 Primary osteoarthritis, left hand: Secondary | ICD-10-CM | POA: Insufficient documentation

## 2023-05-25 DIAGNOSIS — R2681 Unsteadiness on feet: Secondary | ICD-10-CM | POA: Diagnosis present

## 2023-05-25 DIAGNOSIS — R0602 Shortness of breath: Secondary | ICD-10-CM | POA: Diagnosis present

## 2023-05-25 DIAGNOSIS — Z79899 Other long term (current) drug therapy: Secondary | ICD-10-CM | POA: Insufficient documentation

## 2023-05-25 DIAGNOSIS — M353 Polymyalgia rheumatica: Secondary | ICD-10-CM | POA: Diagnosis present

## 2023-05-25 DIAGNOSIS — M0609 Rheumatoid arthritis without rheumatoid factor, multiple sites: Secondary | ICD-10-CM | POA: Insufficient documentation

## 2023-05-25 DIAGNOSIS — M19041 Primary osteoarthritis, right hand: Secondary | ICD-10-CM | POA: Diagnosis not present

## 2023-05-25 DIAGNOSIS — E559 Vitamin D deficiency, unspecified: Secondary | ICD-10-CM | POA: Insufficient documentation

## 2023-05-25 DIAGNOSIS — R5383 Other fatigue: Secondary | ICD-10-CM | POA: Insufficient documentation

## 2023-05-25 DIAGNOSIS — Z8639 Personal history of other endocrine, nutritional and metabolic disease: Secondary | ICD-10-CM | POA: Diagnosis present

## 2023-05-25 DIAGNOSIS — M810 Age-related osteoporosis without current pathological fracture: Secondary | ICD-10-CM | POA: Diagnosis present

## 2023-05-25 DIAGNOSIS — M5134 Other intervertebral disc degeneration, thoracic region: Secondary | ICD-10-CM | POA: Diagnosis present

## 2023-05-25 DIAGNOSIS — Z8669 Personal history of other diseases of the nervous system and sense organs: Secondary | ICD-10-CM | POA: Insufficient documentation

## 2023-05-25 MED ORDER — DICLOFENAC SODIUM 1 % EX GEL
CUTANEOUS | 2 refills | Status: DC
  Filled 2023-05-25: qty 400, 25d supply, fill #0
  Filled 2023-07-31: qty 400, 25d supply, fill #1
  Filled 2023-12-16: qty 400, 25d supply, fill #2

## 2023-05-25 MED ORDER — HYDROXYCHLOROQUINE SULFATE 200 MG PO TABS
200.0000 mg | ORAL_TABLET | Freq: Two times a day (BID) | ORAL | 0 refills | Status: DC
Start: 2023-05-25 — End: 2023-08-24
  Filled 2023-05-25: qty 180, 90d supply, fill #0

## 2023-05-25 NOTE — Patient Instructions (Signed)
Standing Labs We placed an order today for your standing lab work.   Please have your standing labs drawn in  May and every 3 months  Please have your labs drawn 2 weeks prior to your appointment so that the provider can discuss your lab results at your appointment, if possible.  Please note that you may see your imaging and lab results in MyChart before we have reviewed them. We will contact you once all results are reviewed. Please allow our office up to 72 hours to thoroughly review all of the results before contacting the office for clarification of your results.  WALK-IN LAB HOURS  Monday through Thursday from 8:00 am -12:30 pm and 1:00 pm-5:00 pm and Friday from 8:00 am-12:00 pm.  Patients with office visits requiring labs will be seen before walk-in labs.  You may encounter longer than normal wait times. Please allow additional time. Wait times may be shorter on  Monday and Thursday afternoons.  We do not book appointments for walk-in labs. We appreciate your patience and understanding with our staff.   Labs are drawn by [facility]. Please bring your co-pay at the time of your lab draw.  You may receive a bill from [facility] for your lab work.  Please note if you are on Hydroxychloroquine and and an order has been placed for a Hydroxychloroquine level,  you will need to have it drawn 4 hours or more after your last dose.  If you wish to have your labs drawn at another location, please call the office 24 hours in advance so we can fax the orders.  The office is located at [address], [address], [place], [place] [address]   If you have any questions regarding directions or hours of operation,  please call [phone].   As a reminder, please drink plenty of water prior to coming for your lab work. Thanks!   Vaccines You are taking a medication(s) that can suppress your immune system.  The following immunizations are recommended: Flu annually Covid-19  RSV Td/Tdap (tetanus,  diphtheria, pertussis) every 10 years Pneumonia (Prevnar 15 then Pneumovax 23 at least 1 year apart.  Alternatively, can take Prevnar 20 without needing additional dose) Shingrix: 2 doses from 4 weeks to 6 months apart  Please check with your PCP to make sure you are up to date.   If you have signs or symptoms of an infection or start antibiotics: First, call your PCP for workup of your infection. Hold your medication through the infection, until you complete your antibiotics, and until symptoms resolve if you take the following: Injectable medication (Actemra, Benlysta, Cimzia, Cosentyx, Enbrel, Humira, Kevzara, Orencia, Remicade, Simponi, Stelara, Taltz, Tremfya) Methotrexate Leflunomide (Arava) Mycophenolate (Cellcept) Harriette Ohara, Olumiant, or Rinvoq

## 2023-05-26 ENCOUNTER — Other Ambulatory Visit (HOSPITAL_BASED_OUTPATIENT_CLINIC_OR_DEPARTMENT_OTHER): Payer: Self-pay

## 2023-05-26 ENCOUNTER — Other Ambulatory Visit: Payer: Self-pay

## 2023-05-26 LAB — VITAMIN D 25 HYDROXY (VIT D DEFICIENCY, FRACTURES): Vit D, 25-Hydroxy: 40 ng/mL (ref 30–100)

## 2023-05-29 ENCOUNTER — Telehealth: Payer: Self-pay

## 2023-05-29 ENCOUNTER — Other Ambulatory Visit: Payer: Self-pay | Admitting: Pharmacist

## 2023-05-29 NOTE — Progress Notes (Signed)
Therapy plan placed for Prolia SQ [phone]) for [facility] to start benefits investigation. He is a new start to Prolia.  Diagnosis: age-related osteoporosis  Provider:  [name]  Dose: 60mg  SQ every 6 months  Last Clinic Visit: 05/25/2023 Next Clinic Visit: 11/01/23  Pertinent Labs: PTH, vitamin D, CBC, CMP stable to proceed  Chesley Mires, PharmD, MPH, BCPS, CPP Clinical Pharmacist (Rheumatology and Pulmonology)

## 2023-05-29 NOTE — Telephone Encounter (Signed)
Jeffery [name], patient will be scheduled as soon as possible.  Auth Submission: NO AUTH NEEDED Site of care: Site of care: [facility] Payer: Medicare A/B with BCBS Supplement Medication & CPT/J Code(s) submitted: Prolia (Denosumab) [id number] Route of submission (phone, fax, portal):  Phone # Fax # Auth type: Buy/Bill PB Units/visits requested: 60mg  x 2 doses Reference number:  Approval from: 05/29/23 to 05/11/24

## 2023-05-29 NOTE — Telephone Encounter (Signed)
 FYI

## 2023-06-05 ENCOUNTER — Ambulatory Visit: Payer: Medicare Other

## 2023-06-05 VITALS — BP 123/71 | HR 69 | Temp 98.5°F | Resp 12 | Ht 73.0 in | Wt 154.6 lb

## 2023-06-05 DIAGNOSIS — M810 Age-related osteoporosis without current pathological fracture: Secondary | ICD-10-CM

## 2023-06-05 MED ORDER — DENOSUMAB 60 MG/ML ~~LOC~~ SOSY
60.0000 mg | PREFILLED_SYRINGE | Freq: Once | SUBCUTANEOUS | Status: AC
  Administered 2023-06-05: 60 mg via SUBCUTANEOUS

## 2023-06-05 NOTE — Progress Notes (Signed)
 Diagnosis: Osteoporosis  Provider:  Chilton Greathouse MD  Procedure: Injection  Prolia (Denosumab), Dose: 60 mg, Site: subcutaneous, Number of injections: 1  Injection Site(s): Left arm  Post Care: Observation period completed  Discharge: Condition: Stable, Destination: Home . AVS Provided  Performed by:  Wyvonne Lenz, RN

## 2023-06-06 ENCOUNTER — Other Ambulatory Visit (HOSPITAL_BASED_OUTPATIENT_CLINIC_OR_DEPARTMENT_OTHER): Payer: Self-pay

## 2023-06-06 MED ORDER — GABAPENTIN 300 MG PO CAPS
300.0000 mg | ORAL_CAPSULE | Freq: Every day | ORAL | 3 refills | Status: DC
Start: 2023-06-06 — End: 2024-02-22
  Filled 2023-06-06 – 2023-06-19 (×2): qty 90, 90d supply, fill #0

## 2023-06-07 ENCOUNTER — Other Ambulatory Visit: Payer: Self-pay | Admitting: Physician Assistant

## 2023-06-07 ENCOUNTER — Other Ambulatory Visit: Payer: Self-pay

## 2023-06-07 ENCOUNTER — Other Ambulatory Visit (HOSPITAL_BASED_OUTPATIENT_CLINIC_OR_DEPARTMENT_OTHER): Payer: Self-pay

## 2023-06-07 DIAGNOSIS — M0609 Rheumatoid arthritis without rheumatoid factor, multiple sites: Secondary | ICD-10-CM

## 2023-06-07 DIAGNOSIS — Z79899 Other long term (current) drug therapy: Secondary | ICD-10-CM

## 2023-06-07 MED ORDER — METHOTREXATE SODIUM 2.5 MG PO TABS
15.0000 mg | ORAL_TABLET | ORAL | 0 refills | Status: DC
  Filled 2023-06-07 – 2023-06-19 (×2): qty 72, 84d supply, fill #0

## 2023-06-07 NOTE — Telephone Encounter (Signed)
 Last Fill: 03/24/2023  Labs: 05/18/2023 CBC and CMP are normal.   Next Visit: 11/01/2023  Last Visit: 05/25/2023  DX: Rheumatoid arthritis of multiple sites without rheumatoid factor   Current Dose per office note 05/25/2023: MTX 6 tabs po once weekly   Okay to refill Methotrexate?

## 2023-06-13 ENCOUNTER — Ambulatory Visit: Payer: Medicare Other

## 2023-06-13 DIAGNOSIS — I442 Atrioventricular block, complete: Secondary | ICD-10-CM | POA: Diagnosis not present

## 2023-06-15 ENCOUNTER — Encounter: Payer: Self-pay | Admitting: Internal Medicine

## 2023-06-15 LAB — CUP PACEART REMOTE DEVICE CHECK
Battery Remaining Longevity: 55 mo
Battery Voltage: 2.96 V
Brady Statistic AP VP Percent: 7.05 %
Brady Statistic AP VS Percent: 0 %
Brady Statistic AS VP Percent: 91.59 %
Brady Statistic AS VS Percent: 1.36 %
Brady Statistic RA Percent Paced: 7.44 %
Brady Statistic RV Percent Paced: 98.64 %
Date Time Interrogation Session: 20250303224845
Implantable Lead Connection Status: 753985
Implantable Lead Connection Status: 753985
Implantable Lead Implant Date: 20180827
Implantable Lead Implant Date: 20180827
Implantable Lead Location: 753859
Implantable Lead Location: 753860
Implantable Lead Model: 4076
Implantable Lead Model: 4076
Implantable Pulse Generator Implant Date: 20180827
Lead Channel Impedance Value: 323 Ohm
Lead Channel Impedance Value: 380 Ohm
Lead Channel Impedance Value: 380 Ohm
Lead Channel Impedance Value: 437 Ohm
Lead Channel Pacing Threshold Amplitude: 0.625 V
Lead Channel Pacing Threshold Amplitude: 0.75 V
Lead Channel Pacing Threshold Pulse Width: 0.4 ms
Lead Channel Pacing Threshold Pulse Width: 0.4 ms
Lead Channel Sensing Intrinsic Amplitude: 13.375 mV
Lead Channel Sensing Intrinsic Amplitude: 13.375 mV
Lead Channel Sensing Intrinsic Amplitude: 3.5 mV
Lead Channel Sensing Intrinsic Amplitude: 3.5 mV
Lead Channel Setting Pacing Amplitude: 1.5 V
Lead Channel Setting Pacing Amplitude: 2.5 V
Lead Channel Setting Pacing Pulse Width: 0.4 ms
Lead Channel Setting Sensing Sensitivity: 4 mV
Zone Setting Status: 755011

## 2023-06-16 ENCOUNTER — Other Ambulatory Visit (HOSPITAL_BASED_OUTPATIENT_CLINIC_OR_DEPARTMENT_OTHER): Payer: Self-pay

## 2023-06-19 ENCOUNTER — Other Ambulatory Visit (HOSPITAL_BASED_OUTPATIENT_CLINIC_OR_DEPARTMENT_OTHER): Payer: Self-pay

## 2023-06-20 ENCOUNTER — Other Ambulatory Visit (HOSPITAL_BASED_OUTPATIENT_CLINIC_OR_DEPARTMENT_OTHER): Payer: Self-pay

## 2023-06-20 MED ORDER — METHYLPREDNISOLONE 4 MG PO TBPK
ORAL_TABLET | ORAL | 0 refills | Status: DC
Start: 2023-06-20 — End: 2023-07-15
  Filled 2023-06-20: qty 21, 6d supply, fill #0

## 2023-06-30 ENCOUNTER — Other Ambulatory Visit (HOSPITAL_BASED_OUTPATIENT_CLINIC_OR_DEPARTMENT_OTHER): Payer: Self-pay

## 2023-07-05 ENCOUNTER — Telehealth: Payer: Self-pay | Admitting: Neurology

## 2023-07-05 NOTE — Telephone Encounter (Signed)
 Per visit with Dr Terrace Arabia 01/11/23; assessment and plan: Prescription of left AFO for left foot drop.   Order faxed to [facility]. Confirmation received.

## 2023-07-05 NOTE — Telephone Encounter (Signed)
 [name] @ [facility] reports pt has come there with a very old an incomplete prescription.  Jeffery [name] states pt is in need of a Rx for Left foot AFO ph [phone] fax#[phone]

## 2023-07-06 NOTE — Telephone Encounter (Signed)
 Contacted [facility] and spoke to [place].  She stated everything was received. Pt is scheduled April 9th for set up.

## 2023-07-06 NOTE — Telephone Encounter (Signed)
 Pt called stating that [facility] informed him that the Rx that was sent for his AFO was incomplete and they are needing the provider to complete it and refax it back to the office.

## 2023-07-15 ENCOUNTER — Other Ambulatory Visit (HOSPITAL_BASED_OUTPATIENT_CLINIC_OR_DEPARTMENT_OTHER): Payer: Self-pay

## 2023-07-15 ENCOUNTER — Other Ambulatory Visit: Payer: Self-pay | Admitting: Rheumatology

## 2023-07-15 DIAGNOSIS — Z79899 Other long term (current) drug therapy: Secondary | ICD-10-CM

## 2023-07-15 DIAGNOSIS — M0609 Rheumatoid arthritis without rheumatoid factor, multiple sites: Secondary | ICD-10-CM

## 2023-07-18 NOTE — Addendum Note (Signed)
 Addended by: Geralyn Flash D on: 07/18/2023 04:52 PM   Modules accepted: Orders

## 2023-07-18 NOTE — Progress Notes (Signed)
 Remote pacemaker transmission.

## 2023-07-20 ENCOUNTER — Other Ambulatory Visit (HOSPITAL_BASED_OUTPATIENT_CLINIC_OR_DEPARTMENT_OTHER): Payer: Self-pay

## 2023-07-25 ENCOUNTER — Telehealth: Payer: Self-pay | Admitting: Neurology

## 2023-07-25 NOTE — Telephone Encounter (Signed)
 Pt called to r/s appointment

## 2023-07-25 NOTE — Telephone Encounter (Signed)
 LVM and sent mychart msg informing pt of need to reschedule 08/09/23 appt - MD out

## 2023-07-26 ENCOUNTER — Encounter: Payer: Self-pay | Admitting: Physician Assistant

## 2023-07-26 ENCOUNTER — Other Ambulatory Visit (HOSPITAL_BASED_OUTPATIENT_CLINIC_OR_DEPARTMENT_OTHER): Payer: Self-pay

## 2023-07-31 ENCOUNTER — Other Ambulatory Visit: Payer: Self-pay | Admitting: Neurology

## 2023-07-31 ENCOUNTER — Encounter: Payer: Self-pay | Admitting: Physician Assistant

## 2023-07-31 ENCOUNTER — Other Ambulatory Visit (HOSPITAL_BASED_OUTPATIENT_CLINIC_OR_DEPARTMENT_OTHER): Payer: Self-pay

## 2023-07-31 NOTE — Telephone Encounter (Signed)
 Received a call from [facility], Melissa stating that the patient needs a updated office note in order to get AFO approved through insurance. She will call the pt to advise he needs an appt.

## 2023-08-01 ENCOUNTER — Other Ambulatory Visit (HOSPITAL_BASED_OUTPATIENT_CLINIC_OR_DEPARTMENT_OTHER): Payer: Self-pay

## 2023-08-01 ENCOUNTER — Encounter: Payer: Self-pay | Admitting: Physician Assistant

## 2023-08-01 MED ORDER — FOLIC ACID 1 MG PO TABS
1.0000 mg | ORAL_TABLET | Freq: Every day | ORAL | 3 refills | Status: AC
  Filled 2023-08-01 – 2023-08-24 (×2): qty 90, 90d supply, fill #0
  Filled 2023-11-23: qty 90, 90d supply, fill #1
  Filled 2024-02-23 – 2024-02-24 (×2): qty 90, 90d supply, fill #2

## 2023-08-02 ENCOUNTER — Encounter: Payer: Self-pay | Admitting: Neurology

## 2023-08-02 NOTE — Telephone Encounter (Signed)
 Appointment details confirmed for 10-18-23

## 2023-08-09 ENCOUNTER — Ambulatory Visit: Payer: Medicare Other | Admitting: Neurology

## 2023-08-11 ENCOUNTER — Other Ambulatory Visit (HOSPITAL_BASED_OUTPATIENT_CLINIC_OR_DEPARTMENT_OTHER): Payer: Self-pay

## 2023-08-16 ENCOUNTER — Telehealth: Payer: Self-pay | Admitting: Neurology

## 2023-08-16 ENCOUNTER — Telehealth: Admitting: Neurology

## 2023-08-16 ENCOUNTER — Encounter: Payer: Self-pay | Admitting: Neurology

## 2023-08-16 DIAGNOSIS — M21372 Foot drop, left foot: Secondary | ICD-10-CM | POA: Diagnosis not present

## 2023-08-16 NOTE — Patient Instructions (Addendum)
 retroflexion weakness at the ankle, and bilateral foot drop.   Known history of lumbar and cervical degenerative disease, Acute worsening of gait abnormality since June 2024,             Examination showed length-dependent sensory changes, distal weakness, left worse than right, bilateral foot drop, stiff unsteady gait,               EMG nerve conduction study today demonstrate length-dependent moderate axonal sensorimotor polyneuropathy, with superimposed chronic bilateral lumbosacral radiculopathy, left worse than right,             His gait abnormality are multifactorial,              Most worrisome for worsening lumbar stenosis, MRI of lumbar,             MRI cervical to rule out superimposed cervical spondylitic myelopathy             Prescription of left AFO for left foot drop   Assessment and Plan: AFO for the most affected left foot to reduce fall risk.      Follow Up Instructions: order to  [facility]  [phone]     I discussed the assessment and treatment plan with the patient. The patient was provided an opportunity to ask questions and all were answered. The patient agreed with the plan and demonstrated an understanding of the instructions.   The patient was advised to call back or seek an in-person evaluation if the symptoms worsen or if the condition fails to improve as anticipated.   I provided 15 minutes of non-face-to-face time during this encounter.     Neomia Banner, MD  Common Peroneal Nerve Entrapment  Common peroneal nerve entrapment is a condition that can make it hard to lift a foot. The condition results from pressure on a nerve in the lower leg called the common peroneal nerve. Your common peroneal nerve provides feeling to your outer lower leg and foot. It also supplies the muscles that move your foot and toes upward and outward. What are the causes? This condition may be caused by: Sitting cross-legged, squatting, or kneeling for long periods of time. A  hard, direct hit to the outside of the lower leg. Swelling from a knee injury. A break or fracture in one of the lower leg bones. Wearing a boot or cast that ends just below the knee. A growth or cyst near the nerve. What increases the risk? This condition is more likely to develop in people who play: Contact sports, such as football or hockey. Sports where you wear high and stiff boots, such as skiing. What are the signs or symptoms? Symptoms of this condition include: Trouble lifting your foot up (foot drop). Tripping often. Your foot hitting the ground harder than normal as you walk, resulting in a slapping-type sound. Numbness, tingling, or pain in the outside of the knee, outside of the lower leg, and top of the foot. Sensitivity to pressure on the front or outside of the leg. How is this diagnosed? This condition may be diagnosed based on: Your symptoms. Your medical history. A physical exam. Tests, such as: X-rays to check the bones of your knee and leg. MRI to check tendons that attach to the side of your knee. Ultrasound to check for a growth or cyst. Electromyogram (EMG) to check your nerves. During your physical exam, your health care provider will check for numbness in your leg and test the strength of your  lower leg muscles. He or she may tap the side of your lower leg to see if that causes tingling. How is this treated? Treatment for this condition may include: Avoiding activities that make symptoms worse. Using a brace to hold up your foot and toes. Taking anti-inflammatory pain medicines to relieve swelling and lessen pain. Having medicines injected into your ankle joint to lessen pain and swelling. Doing exercises to help you regain or maintain movement (physical therapy). Surgery to take pressure off the nerve. This may be needed if there is no improvement after 2-3 months or if there is a growth pushing on the nerve. Returning gradually to full activity. Follow  these instructions at home: If you have a removable brace: Wear the brace as told by your health care provider. Remove it only as told by your health care provider. Check the skin around the brace every day. Tell your health care provider about any concerns. Loosen the brace if your toes tingle, become numb, or turn cold and blue. Keep the brace clean. If the brace is not waterproof: Do not let it get wet. Cover it with a watertight covering when you take a bath or a shower. Activity Ask your health care provider when it is safe to drive if you have a brace on your foot. Do not do any activities that make pain or swelling worse. Do exercises as told by your health care provider. Return to your normal activities as told by your health care provider. Ask your health care provider what activities are safe for you. General instructions Take over-the-counter and prescription medicines only as told by your health care provider. Do not put your full weight on your knee until your health care provider says you can. Use crutches as directed by your health care provider. Keep all follow-up visits. This is important. How is this prevented? Wear supportive footwear that is appropriate for your athletic activity. Avoid athletic activities that cause ankle pain or swelling. Wear protective padding over your lower legs when playing contact sports. Make sure your boots do not put extra pressure on the area just below your knees. Do not sit cross-legged for long periods of time. Contact a health care provider if: Your symptoms do not get better in 2-3 months. The weakness or numbness in your leg or foot gets worse. Summary Common peroneal nerve entrapment is a condition that results from pressure on a nerve in the lower leg called the common peroneal nerve. This condition may be caused by a hard hit, swelling, a fracture, or a cyst in the lower leg. Treatment may include rest, a brace, medicines, and  physical therapy. Sometimes surgery is needed. Do not do any activities that make pain or swelling worse. This information is not intended to replace advice given to you by your health care provider. Make sure you discuss any questions you have with your health care provider. Document Revised: 12/09/2020 Document Reviewed: 12/09/2020 Elsevier Patient Education  2024 [employer].

## 2023-08-16 NOTE — Telephone Encounter (Signed)
 Per previous message, pt just needed updated ov notes sent to [facility] for them to get his order processed. Notes sent and faxed received confirmation.

## 2023-08-16 NOTE — Progress Notes (Signed)
 Virtual Visit via Video Note  I connected with Jeffery [name] on 08/16/23 at  9:00 AM EDT by a video enabled telemedicine application and verified that I am speaking with the correct person using two identifiers.  Location: Patient: at home  Provider: at [facility]   I discussed the limitations of evaluation and management by telemedicine and the availability of in person appointments. The patient expressed understanding and agreed to proceed.  History of Present Illness: Mr Jeffery [name] has been impaired in gait and gait stability with an increased risk of falls. He has seen neurosurgeon for spinal stenosis, DDD lumbar spine and  carries a dx of rheumatoid arthritis .  NCV and EMG with Dr Jeffery [name] had confirmed a diagnosis based of neuropathy .There  was electrodiagnostic evidence of moderate axonal sensorimotor polyneuropathy ( the most common form of neuropathy) , with superimposed chronic bilateral lumbosacral radiculopathy, left worse than right, mainly involving bilateral L4-5 -S1.   There is no evidence of intrinsic muscle disease, or active cervical radiculopathy.  That means no evidence of a neuromuscular degenerative process.   I provided the recommendations to take Alpha Lipoic fatty acid and coenzyme Q 10 to stall neuropathy.    Observations/Objective: retroflexion weakness at the ankle, and bilateral foot drop.  Known history of lumbar and cervical degenerative disease, Acute worsening of gait abnormality since June 2024,             Examination showed length-dependent sensory changes, distal weakness, left worse than right, bilateral foot drop, stiff unsteady gait,               EMG nerve conduction study today demonstrate length-dependent moderate axonal sensorimotor polyneuropathy, with superimposed chronic bilateral lumbosacral radiculopathy, left worse than right,             His gait abnormality are multifactorial,              Most worrisome for worsening lumbar stenosis, MRI of  lumbar,             MRI cervical to rule out superimposed cervical spondylitic myelopathy             Prescription of left AFO for left foot drop  Assessment and Plan: AFO for the most affected left foot to reduce fall risk.    Follow Up Instructions: order to  [facility]  [phone]    I discussed the assessment and treatment plan with the patient. The patient was provided an opportunity to ask questions and all were answered. The patient agreed with the plan and demonstrated an understanding of the instructions.   The patient was advised to call back or seek an in-person evaluation if the symptoms worsen or if the condition fails to improve as anticipated.  I provided 15 minutes of non-face-to-face time during this encounter.   Neomia Banner, MD

## 2023-08-22 ENCOUNTER — Telehealth: Payer: Self-pay | Admitting: Neurology

## 2023-08-22 NOTE — Telephone Encounter (Signed)
 Script signed and sent to [facility]. Received confirmation

## 2023-08-22 NOTE — Telephone Encounter (Signed)
 We received the form. Once Dr Dohmeier signs, will fax for the pt.

## 2023-08-22 NOTE — Telephone Encounter (Signed)
 [facility]) calling to verify if received faxed standard written form for physician to sign off on referral for patient to get an AFO. Would like a call back.

## 2023-08-24 ENCOUNTER — Other Ambulatory Visit: Payer: Self-pay | Admitting: Rheumatology

## 2023-08-24 ENCOUNTER — Other Ambulatory Visit (HOSPITAL_BASED_OUTPATIENT_CLINIC_OR_DEPARTMENT_OTHER): Payer: Self-pay

## 2023-08-24 ENCOUNTER — Encounter: Payer: Self-pay | Admitting: Physician Assistant

## 2023-08-24 DIAGNOSIS — M0609 Rheumatoid arthritis without rheumatoid factor, multiple sites: Secondary | ICD-10-CM

## 2023-08-24 MED FILL — Hydroxychloroquine Sulfate Tab 200 MG: ORAL | 30 days supply | Qty: 60 | Fill #0 | Status: AC

## 2023-08-24 NOTE — Telephone Encounter (Signed)
 Last Fill: 05/25/2023  Eye exam: 05/09/2022 WNL    Labs: 05/18/2023 CBC and CMP are normal.   Next Visit: 11/01/2023  Last Visit: 05/25/2023  DX: Rheumatoid arthritis of multiple sites without rheumatoid factor   Current Dose per office note 05/25/2023: PLQ 200 mg BID.   Left message to advise patient he is due to update his PLQ eye exam and his labs.   Okay to refill Plaquenil ?

## 2023-09-09 ENCOUNTER — Other Ambulatory Visit (HOSPITAL_BASED_OUTPATIENT_CLINIC_OR_DEPARTMENT_OTHER): Payer: Self-pay

## 2023-09-11 ENCOUNTER — Other Ambulatory Visit (HOSPITAL_BASED_OUTPATIENT_CLINIC_OR_DEPARTMENT_OTHER): Payer: Self-pay

## 2023-09-11 MED ORDER — CLONAZEPAM 0.5 MG PO TABS
0.5000 mg | ORAL_TABLET | Freq: Every evening | ORAL | 2 refills | Status: DC | PRN
  Filled 2023-09-11: qty 30, 30d supply, fill #0
  Filled 2023-09-21 – 2023-10-09 (×2): qty 30, 30d supply, fill #1
  Filled 2023-11-23: qty 30, 30d supply, fill #2

## 2023-09-12 ENCOUNTER — Ambulatory Visit (INDEPENDENT_AMBULATORY_CARE_PROVIDER_SITE_OTHER): Payer: Medicare Other

## 2023-09-12 DIAGNOSIS — I442 Atrioventricular block, complete: Secondary | ICD-10-CM

## 2023-09-12 LAB — CUP PACEART REMOTE DEVICE CHECK
Battery Remaining Longevity: 51 mo
Battery Voltage: 2.95 V
Brady Statistic AP VP Percent: 7.68 %
Brady Statistic AP VS Percent: 0 %
Brady Statistic AS VP Percent: 92.22 %
Brady Statistic AS VS Percent: 0.11 %
Brady Statistic RA Percent Paced: 7.69 %
Brady Statistic RV Percent Paced: 99.89 %
Date Time Interrogation Session: 20250602195513
Implantable Lead Connection Status: 753985
Implantable Lead Connection Status: 753985
Implantable Lead Implant Date: 20180827
Implantable Lead Implant Date: 20180827
Implantable Lead Location: 753859
Implantable Lead Location: 753860
Implantable Lead Model: 4076
Implantable Lead Model: 4076
Implantable Pulse Generator Implant Date: 20180827
Lead Channel Impedance Value: 323 Ohm
Lead Channel Impedance Value: 380 Ohm
Lead Channel Impedance Value: 380 Ohm
Lead Channel Impedance Value: 437 Ohm
Lead Channel Pacing Threshold Amplitude: 0.625 V
Lead Channel Pacing Threshold Amplitude: 0.75 V
Lead Channel Pacing Threshold Pulse Width: 0.4 ms
Lead Channel Pacing Threshold Pulse Width: 0.4 ms
Lead Channel Sensing Intrinsic Amplitude: 13.375 mV
Lead Channel Sensing Intrinsic Amplitude: 13.375 mV
Lead Channel Sensing Intrinsic Amplitude: 4.5 mV
Lead Channel Sensing Intrinsic Amplitude: 4.5 mV
Lead Channel Setting Pacing Amplitude: 1.5 V
Lead Channel Setting Pacing Amplitude: 2.5 V
Lead Channel Setting Pacing Pulse Width: 0.4 ms
Lead Channel Setting Sensing Sensitivity: 4 mV
Zone Setting Status: 755011

## 2023-09-14 ENCOUNTER — Ambulatory Visit: Payer: Self-pay | Admitting: Internal Medicine

## 2023-09-15 ENCOUNTER — Telehealth: Payer: Self-pay | Admitting: Rheumatology

## 2023-09-15 LAB — LAB REPORT - SCANNED
A1c: 6.8
Albumin, Urine POC: 6.3
Creatinine, POC: 68.9 mg/dL
EGFR (Non-African Amer.): 127.7

## 2023-09-15 NOTE — Telephone Encounter (Signed)
 Patient called stating he is scheduled for his Plaquenil  eye exam on Monday, 09/18/23 with Dr. Seward Dao.  Patient states he had labwork at his physical last week with Dr. Mamie Searles.

## 2023-09-15 NOTE — Telephone Encounter (Signed)
 Contacted patient and advised to have eye exam sent after visit is complete. Patient advised to contact PCP to have labs faxed.

## 2023-09-18 LAB — HM DIABETES EYE EXAM

## 2023-09-20 ENCOUNTER — Telehealth: Payer: Self-pay | Admitting: *Deleted

## 2023-09-20 NOTE — Telephone Encounter (Signed)
 Labs received from:[facility]  Drawn on: 09/14/2023  Reviewed by: Dr. Nicholas Bari   Labs drawn: Microal/Creat. Ratio UA, UA, CMP, CBC, Lipid Panel, TSH, PSA, Vitamin D , Hgb A1C, Apolipoprotein B   Results: Glucose 115      MCV 97.2     MCH 32.5     MPV 6.8     HDL 62     LDL/HDL Ratio 1.1     A1C 6.8  Patient is on PLQ 200 mg po BID, MTX 6 tabs po weekly, Folic Acid  1 mg po daily.

## 2023-09-21 ENCOUNTER — Other Ambulatory Visit (HOSPITAL_BASED_OUTPATIENT_CLINIC_OR_DEPARTMENT_OTHER): Payer: Self-pay

## 2023-09-21 ENCOUNTER — Other Ambulatory Visit: Payer: Self-pay | Admitting: Rheumatology

## 2023-09-21 DIAGNOSIS — Z79899 Other long term (current) drug therapy: Secondary | ICD-10-CM

## 2023-09-21 DIAGNOSIS — M0609 Rheumatoid arthritis without rheumatoid factor, multiple sites: Secondary | ICD-10-CM

## 2023-09-22 ENCOUNTER — Other Ambulatory Visit (HOSPITAL_BASED_OUTPATIENT_CLINIC_OR_DEPARTMENT_OTHER): Payer: Self-pay

## 2023-09-22 MED ORDER — METHOTREXATE SODIUM 2.5 MG PO TABS
15.0000 mg | ORAL_TABLET | ORAL | 0 refills | Status: DC
  Filled 2023-09-22: qty 72, 84d supply, fill #0
  Filled ????-??-??: fill #0

## 2023-09-22 NOTE — Telephone Encounter (Signed)
 Last Fill: 06/07/2023  Labs: 09/14/2023    Glucose 115                 MCV 97.2                MCH 32.5                MPV 6.8                HDL 62                LDL/HDL Ratio 1.1  Next Visit: 11/01/2023  Last Visit: 05/25/2023  DX: Rheumatoid arthritis of multiple sites without rheumatoid factor   Current Dose per office note 05/25/2023: MTX 6 tabs po once weekly,   Okay to refill Methotrexate ?

## 2023-09-25 ENCOUNTER — Telehealth: Payer: Self-pay | Admitting: Rheumatology

## 2023-09-25 ENCOUNTER — Ambulatory Visit: Admitting: Neurology

## 2023-09-25 NOTE — Telephone Encounter (Signed)
 Returned call to patient. He states he was following up to see if we received his eye exam and his lab results. Patient advised we have received his lab results but not his eye exam. Patient states he will follow up with the eye doctor to have them send it.

## 2023-09-25 NOTE — Telephone Encounter (Signed)
 Pt called on June 13th requesting Jeffery [name] to call him at [phone]

## 2023-10-09 ENCOUNTER — Other Ambulatory Visit: Payer: Self-pay

## 2023-10-18 ENCOUNTER — Ambulatory Visit: Admitting: Neurology

## 2023-10-18 NOTE — Progress Notes (Addendum)
 Office Visit Note  Patient: Jeffery [name]             Date of Birth: November 13, 1934           MRN: [id number]             PCP: Onita Rush, MD Referring: Onita Rush, MD Visit Date: 11/01/2023 Occupation: @GUAROCC @  Subjective:  Medication management  History of Present Illness: Jeffery [name] is a 89 y.o. male with seropositive rheumatoid arthritis, polymyalgia rheumatica, osteoarthritis, degenerative disc disease and osteoporosis.  He returns today after his last visit in February 2025.  He denies having a flare of rheumatoid arthritis.  He states he has been getting some strength back by exercising.  He has been using a rollator walker for ambulation.  He denies any increased joint swelling or muscle weakness.  She denies any shortness of breath.  He notices some stiffness in his hands but no joint swelling.    Activities of Daily Living:  Patient reports morning stiffness for 24 hours.   Patient Denies nocturnal pain.  Difficulty dressing/grooming: Denies Difficulty climbing stairs: Reports Difficulty getting out of chair: Denies Difficulty using hands for taps, buttons, cutlery, and/or writing: Denies  Review of Systems  Constitutional:  Positive for fatigue.  HENT:  Negative for mouth sores and mouth dryness.   Eyes:  Negative for dryness.  Respiratory:  Negative for shortness of breath.   Cardiovascular:  Negative for chest pain and palpitations.  Gastrointestinal:  Negative for blood in stool, constipation and diarrhea.  Endocrine: Negative for increased urination.  Genitourinary:  Negative for involuntary urination.  Musculoskeletal:  Positive for joint pain, gait problem, joint pain, myalgias, muscle weakness, morning stiffness and myalgias. Negative for joint swelling and muscle tenderness.  Skin:  Positive for sensitivity to sunlight. Negative for color change, rash and hair loss.  Allergic/Immunologic: Negative for susceptible to infections.  Neurological:   Negative for dizziness and headaches.  Hematological:  Negative for swollen glands.  Psychiatric/Behavioral:  Positive for sleep disturbance. Negative for depressed mood. The patient is not nervous/anxious.     PMFS History:  Patient Active Problem List   Diagnosis Date Noted   Spinal stenosis of lumbar region 04/14/2023   Shortness of breath 01/23/2023   PVC's (premature ventricular contractions) 12/12/2022   Ptosis of left eyelid 12/07/2022   Slurred speech 12/07/2022   Gait abnormality 12/07/2022   Vitreomacular adhesion, right 08/16/2021   Macular retinoschisis of right eye 08/16/2021   Pulmonary nodules 07/04/2021   Essential hypertension 07/04/2021   Sleep apnea 06/28/2020   Dyslipidemia 06/28/2020   Memory changes 04/16/2020   Pacemaker ECG pattern 09/20/2019   Treatment-emergent central sleep apnea 09/20/2019   Round hole of right eye 08/26/2019   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/26/2019   Cystoid macular edema of right eye 08/26/2019   Pseudophakia of both eyes 08/26/2019   Obstructive sleep apnea of adult 08/26/2019   Educated about COVID-19 virus infection 10/08/2018   Aortic root dilatation (HCC) 10/08/2018   Mitral annular calcification 10/08/2018   Neuropathy 08/21/2017   Hydroxychloroquine  causing adverse effect in therapeutic use 08/21/2017   Symptomatic bradycardia 02/23/2017   Status cardiac pacemaker 02/23/2017   CSA (central sleep apnea) 02/23/2017   Heart block AV complete (HCC) 01/18/2017   Vitamin D  deficiency 02/08/2016   Trigger finger, left middle finger 02/08/2016   Long-term use of Plaquenil  02/08/2016   Bursitis of right shoulder 02/08/2016   DDD (degenerative disc disease), lumbar  02/08/2016   DDD (degenerative disc disease), thoracic 02/08/2016   Rheumatoid arthritis of multiple sites without rheumatoid factor (HCC) 11/28/2015    Class: Chronic   Other secondary osteoarthritis of both hands 11/28/2015    Class: Chronic   PMR  (polymyalgia rheumatica) (HCC) 11/28/2015    Class: Chronic   Osteoporosis 11/28/2015    Class: Chronic   Prostate cancer (HCC) 11/28/2015    Class: History of   Duodenal ulcer 11/28/2015    Class: Chronic   High cholesterol 11/28/2015   Dizziness 07/16/2015   Incomplete RBBB 03/31/2015   Sleep apnea, primary central 03/03/2015   Polyneuropathy (HCC) 01/24/2013   Right inguinal hernia 07/05/2012    Past Medical History:  Diagnosis Date   Arthritis    Autoimmune disorder (HCC)    BCC (basal cell carcinoma)    BPH (benign prostatic hypertrophy) 2008   Cancer [facility]) 2008   Prostate   Cervical stenosis of spine    per patient    Complete heart block (HCC)    Complex sleep apnea syndrome    does not use CPAP   Diabetes mellitus without complication (HCC)    Diverticulosis    ED (erectile dysfunction)    Gastritis    GERD (gastroesophageal reflux disease)    Hemorrhoids    Hernia, inguinal    Hyperlipidemia    IFG (impaired fasting glucose)    Osteopenia    Peptic ulcer disease with hemorrhage 1993   Shingles    Sleep apnea    Vitamin D  deficiency     Family History  Problem Relation Age of Onset   Heart failure Mother        Died age 41.  Apparently had valve surgery.    Congenital heart disease Brother    Stroke Maternal Grandfather    Colon cancer Neg Hx    Stomach cancer Neg Hx    Esophageal cancer Neg Hx    Rectal cancer Neg Hx    Past Surgical History:  Procedure Laterality Date   BACK SURGERY N/A 1984   barium swallow study  2023   x2   CATARACT EXTRACTION, BILATERAL     HERNIA REPAIR     LAMINECTOMY  1984   L 5   PACEMAKER IMPLANT  12/05/2016   MDT Azure XT DR MRI conditional PPM implanted for CHB by Dr Ollie in [place]   PACEMAKER INSERTION  12/05/2016   PROSTATE SURGERY  12/2006   Robotic   TONSILLECTOMY  89 years old   TRIGGER FINGER RELEASE  03/01/2012   Procedure: MINOR RELEASE TRIGGER FINGER/A-1 PULLEY;  Surgeon: Lamar LULLA Leonor Mickey., MD;   Location: [facility];  Service: Orthopedics;  Laterality: Left;  long finger   TRIGGER FINGER RELEASE Left 02/08/2018   Procedure: RELEASE TRIGGER FINGER/A-1 PULLEY LEFT RING FINGER;  Surgeon: Murrell Kuba, MD;  Location: [facility];  Service: Orthopedics;  Laterality: Left;   UPPER GI ENDOSCOPY  2023   Social History   Social History Narrative   Right handed, Retired .Married, 2 kids. Caffeine none.   3 Step    Immunization History  Administered Date(s) Administered   Influenza, High Dose Seasonal PF 03/03/2017, 01/26/2018   Influenza-Unspecified 01/16/2023   Moderna SARS-COV2 Booster Vaccination 10/15/2020   Moderna Sars-Covid-2 Vaccination 04/13/2019, 05/14/2019, 12/12/2019   Pfizer(Comirnaty )Fall Seasonal Vaccine 12 years and older 03/17/2022     Objective: Vital Signs: BP 118/68 (BP Location: Left Arm, Patient Position: Sitting, Cuff Size: Normal)  Pulse 70   Resp 14   Ht 6' 1 (1.854 m)   Wt 157 lb (71.2 kg)   BMI 20.71 kg/m    Physical Exam Vitals and nursing note reviewed.  Constitutional:      Appearance: He is well-developed.  HENT:     Head: Normocephalic and atraumatic.  Eyes:     Conjunctiva/sclera: Conjunctivae normal.     Pupils: Pupils are equal, round, and reactive to light.  Cardiovascular:     Rate and Rhythm: Normal rate and regular rhythm.     Heart sounds: Normal heart sounds.  Pulmonary:     Effort: Pulmonary effort is normal.     Breath sounds: Normal breath sounds.  Abdominal:     General: Bowel sounds are normal.     Palpations: Abdomen is soft.  Musculoskeletal:     Cervical back: Normal range of motion and neck supple.  Skin:    General: Skin is warm and dry.     Capillary Refill: Capillary refill takes less than 2 seconds.  Neurological:     Mental Status: He is alert and oriented to person, place, and time.  Psychiatric:        Behavior: Behavior normal.      Musculoskeletal Exam: He had limited  lateral rotation of the cervical spine.  Thoracic kyphosis without tenderness was noted.  Shoulders, elbows, wrist joints were in good range of motion.  He had bilateral MCP thickening, PIP and DIP thickening.  Subluxation of bilateral first DIP joints was noted without any synovitis.  Hip joints and knee joints with good range of motion.  There was no tenderness over ankles or MTPs.  CDAI Exam: CDAI Score: -- Patient Global: --; Provider Global: -- Swollen: --; Tender: -- Joint Exam 11/01/2023   No joint exam has been documented for this visit   There is currently no information documented on the homunculus. Go to the Rheumatology activity and complete the homunculus joint exam.  Investigation: No additional findings.  Imaging: No results found.  Recent Labs: Lab Results  Component Value Date   WBC 5.1 05/18/2023   HGB 14.0 05/18/2023   PLT 242 05/18/2023   NA 140 05/18/2023   K 5.0 05/18/2023   CL 99 05/18/2023   CO2 31 05/18/2023   GLUCOSE 94 05/18/2023   BUN 14 05/18/2023   CREATININE 0.66 (L) 05/18/2023   BILITOT 0.7 05/18/2023   ALKPHOS 85 03/19/2019   AST 9 (L) 05/18/2023   ALT 14 05/18/2023   PROT 7.0 05/18/2023   ALBUMIN 4.7 (H) 03/19/2019   CALCIUM 9.8 05/18/2023   GFRAA 102 08/10/2020    Speciality Comments: PLQ Eye Exam: 05/09/2022 WNL @ Dr. Roz (in [facility]). Follow up in 1 year  Procedures:  No procedures performed Allergies: Sulfasalazine, Demerol [meperidine], Garlic, Other, and Sulfa antibiotics   Assessment / Plan:     Visit Diagnoses: Rheumatoid arthritis of multiple sites without rheumatoid factor (HCC) - RF negative, CCP negative, 1433 eta negative, ANA negative.  Patient had no synovitis on the examination.  No no joint swelling was noted today.  He has been on methotrexate  and Plaquenil  combination without any interruption.  High risk medication use - - MTX 6 tabs po once weekly, folic acid  1 mg po daily, PLQ 200 mg BID. PLQ Eye Exam:  05/09/2022.  Patient states that he had repeat eye examination on September 18, 2023.  Will contact ophthalmology office to get records.  Labs from September 14, 2023 urine protein  creatinine ratio was normal, UA negative, creatinine 0.6, GFR 127.7, CBC WBC 4.8, hemoglobin 14.7, platelets 275, LDL 70, vitamin D31.4, hemoglobin A1c 6.8.  He was advised to get repeat labs in August.  Information immunization was placed in the AVS.  He was advised to hold methotrexate  if he develops an infection resume after the infection resolves.  PMR (polymyalgia rheumatica) (HCC) - He has been off prednisone  for several years now.  Primary osteoarthritis of both hands-he had bilateral PIP and DIP thickening.  Joint protection muscle strengthening was discussed.  DDD (degenerative disc disease), cervical-Limited range of motion of the cervical spine.  DDD (degenerative disc disease), thoracic-thoracic kyphosis without discomfort was noted.  Spondylosis of lumbar spine-he had no discomfort.  Need for regular exercise was emphasized.  Shortness of breath - PFTs were normal.  He was advised to use CPAP.  Cardiology workup was negative.  Other fatigue-has improved.  Age-related osteoporosis without current pathological fracture - - 03/21 T -1.9.  January 16, 2023 The BMD measured at [facility] is 0.748 g/cm2 with aT-score of -2.5.  Prolia  last injection was June 05, 2023.  He is scheduled to have next injection in August.  Vitamin D  deficiency-vitamin D  was 31.4 on September 14, 2023.  He was encouraged to take vitamin D  2000 units daily.  Gait instability-uses a rollator walker.  Other medical problems are listed as follows:  History of prostate cancer  Heart block AV complete (HCC)  History of diabetes mellitus-hemoglobin A1c was 6.8 at his PCPs office.  History of sleep apnea  History of vitamin D  deficiency  Myasthenia gravis (HCC)  Orders: No orders of the defined types were placed in this  encounter.  No orders of the defined types were placed in this encounter.   Follow-Up Instructions: Return in about 6 months (around 05/03/2024) for Rheumatoid arthritis, Osteoarthritis, Osteoporosis.   Maya Nash, MD  Note - This record has been created using Animal nutritionist.  Chart creation errors have been sought, but may not always  have been located. Such creation errors do not reflect on  the standard of medical care.

## 2023-10-27 ENCOUNTER — Other Ambulatory Visit (HOSPITAL_BASED_OUTPATIENT_CLINIC_OR_DEPARTMENT_OTHER): Payer: Self-pay

## 2023-11-01 ENCOUNTER — Encounter: Payer: Self-pay | Admitting: Rheumatology

## 2023-11-01 ENCOUNTER — Ambulatory Visit: Payer: Medicare Other | Attending: Rheumatology | Admitting: Rheumatology

## 2023-11-01 VITALS — BP 118/68 | HR 70 | Resp 14 | Ht 73.0 in | Wt 157.0 lb

## 2023-11-01 DIAGNOSIS — M47816 Spondylosis without myelopathy or radiculopathy, lumbar region: Secondary | ICD-10-CM | POA: Insufficient documentation

## 2023-11-01 DIAGNOSIS — R0602 Shortness of breath: Secondary | ICD-10-CM | POA: Diagnosis present

## 2023-11-01 DIAGNOSIS — Z8639 Personal history of other endocrine, nutritional and metabolic disease: Secondary | ICD-10-CM | POA: Diagnosis present

## 2023-11-01 DIAGNOSIS — M19042 Primary osteoarthritis, left hand: Secondary | ICD-10-CM | POA: Diagnosis present

## 2023-11-01 DIAGNOSIS — R2681 Unsteadiness on feet: Secondary | ICD-10-CM | POA: Diagnosis present

## 2023-11-01 DIAGNOSIS — Z79899 Other long term (current) drug therapy: Secondary | ICD-10-CM | POA: Diagnosis present

## 2023-11-01 DIAGNOSIS — M5134 Other intervertebral disc degeneration, thoracic region: Secondary | ICD-10-CM | POA: Diagnosis present

## 2023-11-01 DIAGNOSIS — I442 Atrioventricular block, complete: Secondary | ICD-10-CM | POA: Insufficient documentation

## 2023-11-01 DIAGNOSIS — R5383 Other fatigue: Secondary | ICD-10-CM | POA: Diagnosis present

## 2023-11-01 DIAGNOSIS — E559 Vitamin D deficiency, unspecified: Secondary | ICD-10-CM | POA: Insufficient documentation

## 2023-11-01 DIAGNOSIS — G7000 Myasthenia gravis without (acute) exacerbation: Secondary | ICD-10-CM | POA: Insufficient documentation

## 2023-11-01 DIAGNOSIS — M0609 Rheumatoid arthritis without rheumatoid factor, multiple sites: Secondary | ICD-10-CM | POA: Insufficient documentation

## 2023-11-01 DIAGNOSIS — M19041 Primary osteoarthritis, right hand: Secondary | ICD-10-CM | POA: Diagnosis present

## 2023-11-01 DIAGNOSIS — M353 Polymyalgia rheumatica: Secondary | ICD-10-CM | POA: Insufficient documentation

## 2023-11-01 DIAGNOSIS — Z8669 Personal history of other diseases of the nervous system and sense organs: Secondary | ICD-10-CM | POA: Insufficient documentation

## 2023-11-01 DIAGNOSIS — Z8546 Personal history of malignant neoplasm of prostate: Secondary | ICD-10-CM | POA: Diagnosis present

## 2023-11-01 DIAGNOSIS — M5030 Other cervical disc degeneration, unspecified cervical region: Secondary | ICD-10-CM | POA: Diagnosis present

## 2023-11-01 DIAGNOSIS — M810 Age-related osteoporosis without current pathological fracture: Secondary | ICD-10-CM | POA: Diagnosis present

## 2023-11-01 NOTE — Patient Instructions (Signed)
 Standing Labs We placed an order today for your standing lab work.   Please have your standing labs drawn in September and every 3 months  Please have your labs drawn 2 weeks prior to your appointment so that the provider can discuss your lab results at your appointment, if possible.  Please note that you may see your imaging and lab results in MyChart before we have reviewed them. We will contact you once all results are reviewed. Please allow our office up to 72 hours to thoroughly review all of the results before contacting the office for clarification of your results.  WALK-IN LAB HOURS  Monday through Thursday from 8:00 am -12:30 pm and 1:00 pm-4:30 pm and Friday from 8:00 am-12:00 pm.  Patients with office visits requiring labs will be seen before walk-in labs.  You may encounter longer than normal wait times. Please allow additional time. Wait times may be shorter on  Monday and Thursday afternoons.  We do not book appointments for walk-in labs. We appreciate your patience and understanding with our staff.   Labs are drawn by [facility]. Please bring your co-pay at the time of your lab draw.  You may receive a bill from [facility] for your lab work.  Please note if you are on Hydroxychloroquine  and and an order has been placed for a Hydroxychloroquine  level,  you will need to have it drawn 4 hours or more after your last dose.  If you wish to have your labs drawn at another location, please call the office 24 hours in advance so we can fax the orders.  The office is located at [address], [place], [place] [address]   If you have any questions regarding directions or hours of operation,  please call [phone].   As a reminder, please drink plenty of water prior to coming for your lab work. Thanks!   Vaccines You are taking a medication(s) that can suppress your immune system.  The following immunizations are recommended: Flu annually Covid-19  Td/Tdap (tetanus,  diphtheria, pertussis) every 10 years Pneumonia (Prevnar 15 then Pneumovax 23 at least 1 year apart.  Alternatively, can take Prevnar 20 without needing additional dose) Shingrix: 2 doses from 4 weeks to 6 months apart  Please check with your PCP to make sure you are up to date.   If you have signs or symptoms of an infection or start antibiotics: First, call your PCP for workup of your infection. Hold your medication through the infection, until you complete your antibiotics, and until symptoms resolve if you take the following: Injectable medication (Actemra, Benlysta, Cimzia, Cosentyx, Enbrel, Humira , Kevzara, Orencia, Remicade, Simponi, Stelara, Taltz, Tremfya) Methotrexate  Leflunomide (Arava) Mycophenolate (Cellcept) Xeljanz, Olumiant, or Rinvoq

## 2023-11-09 NOTE — Progress Notes (Signed)
 Remote pacemaker transmission.

## 2023-11-13 ENCOUNTER — Telehealth: Payer: Self-pay | Admitting: Pharmacist

## 2023-11-13 NOTE — Telephone Encounter (Signed)
 Patient due for Prolia  on 12/02/2023. Scheduled at [facility] on 12/07/2023  Vitamin D  and CMP on 09/15/2023  Sherry Pennant, PharmD, MPH, BCPS, CPP Clinical Pharmacist (Rheumatology and Pulmonology)

## 2023-11-23 ENCOUNTER — Encounter: Payer: Self-pay | Admitting: Physician Assistant

## 2023-11-23 ENCOUNTER — Other Ambulatory Visit: Payer: Self-pay

## 2023-11-23 ENCOUNTER — Other Ambulatory Visit: Payer: Self-pay | Admitting: Rheumatology

## 2023-11-23 ENCOUNTER — Other Ambulatory Visit (HOSPITAL_BASED_OUTPATIENT_CLINIC_OR_DEPARTMENT_OTHER): Payer: Self-pay

## 2023-11-23 DIAGNOSIS — M0609 Rheumatoid arthritis without rheumatoid factor, multiple sites: Secondary | ICD-10-CM

## 2023-11-23 DIAGNOSIS — Z79899 Other long term (current) drug therapy: Secondary | ICD-10-CM

## 2023-11-23 MED ORDER — HYDROXYCHLOROQUINE SULFATE 200 MG PO TABS
200.0000 mg | ORAL_TABLET | Freq: Two times a day (BID) | ORAL | 0 refills | Status: AC
  Filled 2023-11-23: qty 180, 90d supply, fill #0

## 2023-11-23 MED ORDER — METHOTREXATE SODIUM 2.5 MG PO TABS
15.0000 mg | ORAL_TABLET | ORAL | 0 refills | Status: DC
  Filled 2023-11-23 – 2023-12-09 (×3): qty 72, 84d supply, fill #0

## 2023-11-23 NOTE — Telephone Encounter (Signed)
 Last Fill: 08/24/2023 (PLQ) 09/22/2023 (methotrexate)  Eye exam: 09/14/2023 WNL   Labs: 09/14/2023 Glucose 115                 MCV 97.2                MCH 32.5                MPV 6.8                HDL 62                LDL/HDL Ratio 1.1                A1C 6.8  Next Visit: 05/09/2023  Last Visit: 11/01/2023  DX: Rheumatoid arthritis of multiple sites without rheumatoid factor   Current Dose per office note 11/01/2023: MTX 6 tabs po once weekly, PLQ 200 mg BID.   Okay to refill Plaquenil and methotrexate ?

## 2023-12-07 ENCOUNTER — Ambulatory Visit (INDEPENDENT_AMBULATORY_CARE_PROVIDER_SITE_OTHER): Payer: Medicare Other

## 2023-12-07 VITALS — BP 102/64 | HR 88 | Temp 98.0°F | Resp 12 | Ht 73.0 in | Wt 157.2 lb

## 2023-12-07 DIAGNOSIS — M810 Age-related osteoporosis without current pathological fracture: Secondary | ICD-10-CM | POA: Diagnosis not present

## 2023-12-07 MED ORDER — DENOSUMAB 60 MG/ML ~~LOC~~ SOSY
60.0000 mg | PREFILLED_SYRINGE | Freq: Once | SUBCUTANEOUS | Status: AC
  Administered 2023-12-07: 60 mg via SUBCUTANEOUS
  Filled 2023-12-07: qty 1

## 2023-12-07 NOTE — Progress Notes (Signed)
 Diagnosis: Osteoporosis  Provider:  Mannam, Praveen MD  Procedure: Injection  Prolia  (Denosumab ), Dose: 60 mg, Site: subcutaneous, Number of injections: 1  Injection Site(s): Left arm  Post Care: Patient declined observation  Discharge: Condition: Stable, Destination: Home . AVS Provided  Performed by:  Lendel Quant, RN

## 2023-12-09 ENCOUNTER — Other Ambulatory Visit (HOSPITAL_BASED_OUTPATIENT_CLINIC_OR_DEPARTMENT_OTHER): Payer: Self-pay

## 2023-12-12 ENCOUNTER — Ambulatory Visit (INDEPENDENT_AMBULATORY_CARE_PROVIDER_SITE_OTHER): Payer: Medicare Other

## 2023-12-12 DIAGNOSIS — I442 Atrioventricular block, complete: Secondary | ICD-10-CM | POA: Diagnosis not present

## 2023-12-14 ENCOUNTER — Encounter: Payer: Self-pay | Admitting: Cardiology

## 2023-12-14 LAB — CUP PACEART REMOTE DEVICE CHECK
Battery Remaining Longevity: 47 mo
Battery Voltage: 2.95 V
Brady Statistic AP VP Percent: 6.19 %
Brady Statistic AP VS Percent: 0 %
Brady Statistic AS VP Percent: 93.79 %
Brady Statistic AS VS Percent: 0.02 %
Brady Statistic RA Percent Paced: 6.18 %
Brady Statistic RV Percent Paced: 99.98 %
Date Time Interrogation Session: 20250901234332
Implantable Lead Connection Status: 753985
Implantable Lead Connection Status: 753985
Implantable Lead Implant Date: 20180827
Implantable Lead Implant Date: 20180827
Implantable Lead Location: 753859
Implantable Lead Location: 753860
Implantable Lead Model: 4076
Implantable Lead Model: 4076
Implantable Pulse Generator Implant Date: 20180827
Lead Channel Impedance Value: 323 Ohm
Lead Channel Impedance Value: 380 Ohm
Lead Channel Impedance Value: 380 Ohm
Lead Channel Impedance Value: 437 Ohm
Lead Channel Pacing Threshold Amplitude: 0.75 V
Lead Channel Pacing Threshold Amplitude: 0.75 V
Lead Channel Pacing Threshold Pulse Width: 0.4 ms
Lead Channel Pacing Threshold Pulse Width: 0.4 ms
Lead Channel Sensing Intrinsic Amplitude: 13.375 mV
Lead Channel Sensing Intrinsic Amplitude: 13.375 mV
Lead Channel Sensing Intrinsic Amplitude: 4.375 mV
Lead Channel Sensing Intrinsic Amplitude: 4.375 mV
Lead Channel Setting Pacing Amplitude: 1.5 V
Lead Channel Setting Pacing Amplitude: 2.5 V
Lead Channel Setting Pacing Pulse Width: 0.4 ms
Lead Channel Setting Sensing Sensitivity: 4 mV
Zone Setting Status: 755011

## 2023-12-15 ENCOUNTER — Other Ambulatory Visit (HOSPITAL_BASED_OUTPATIENT_CLINIC_OR_DEPARTMENT_OTHER): Payer: Self-pay

## 2023-12-16 ENCOUNTER — Other Ambulatory Visit (HOSPITAL_BASED_OUTPATIENT_CLINIC_OR_DEPARTMENT_OTHER): Payer: Self-pay

## 2023-12-16 ENCOUNTER — Encounter: Payer: Self-pay | Admitting: Physician Assistant

## 2023-12-17 ENCOUNTER — Ambulatory Visit: Payer: Self-pay | Admitting: Internal Medicine

## 2023-12-20 NOTE — Progress Notes (Signed)
 Remote PPM Transmission

## 2023-12-21 ENCOUNTER — Other Ambulatory Visit (HOSPITAL_BASED_OUTPATIENT_CLINIC_OR_DEPARTMENT_OTHER): Payer: Self-pay

## 2023-12-28 ENCOUNTER — Other Ambulatory Visit (HOSPITAL_BASED_OUTPATIENT_CLINIC_OR_DEPARTMENT_OTHER): Payer: Self-pay

## 2024-01-02 ENCOUNTER — Other Ambulatory Visit (HOSPITAL_BASED_OUTPATIENT_CLINIC_OR_DEPARTMENT_OTHER): Payer: Self-pay

## 2024-01-02 MED ORDER — CLONAZEPAM 0.5 MG PO TABS
0.5000 mg | ORAL_TABLET | Freq: Every evening | ORAL | 2 refills | Status: DC | PRN
  Filled 2024-01-02: qty 30, 30d supply, fill #0
  Filled 2024-02-04: qty 30, 30d supply, fill #1
  Filled 2024-03-20: qty 30, 30d supply, fill #2

## 2024-02-05 ENCOUNTER — Other Ambulatory Visit: Payer: Self-pay

## 2024-02-21 NOTE — Progress Notes (Signed)
 Cardiology Office Note:   Date:  02/22/2024  ID:  Jeffery [name], [name] 1934/10/31, MRN [id number] PCP: Jeffery Rush, MD   [facility] Providers Cardiologist:  Jeffery Schilling, MD Electrophysiologist:  Danelle Birmingham, MD {  History of Present Illness:   Jeffery [name] is a 89 y.o. male  who presents for follow up of third degree HB.  He has a pacemaker per Dr. Kelsie.  He had a follow up echo in December 2021.  He has mildly enlarged aortic root.  He had a CT 06/2022 with a 40 mm aorta with small pulmonary nodules.  Of note he did have some aortic atherosclerosis, coronary calcium as noted previously on echo mitral annular calcification.  He had increased fatigue and frequent PVCs.  It was suggested that he might start beta-blockers but he did not want to do this because of the fatigue.  He is having extensive workup and for a while there was a thought that he might have myasthenia gravis but this turned out to be a negative workup.     He denies any new cardiovascular symptoms.  His biggest complaint has been continued fatigue.  He does think he sleeps okay at night.  He was doing worse before when he was on beta-blockers but he still tired.  He has not had any chest pressure, neck or arm discomfort.  Has had no new shortness of breath, PND or orthopnea.  He has had no weight gain or edema.  He walks slowly with a walker.  ROS: As stated in the HPI and negative for all other systems.  Studies Reviewed:    EKG:   EKG Interpretation Date/Time:  Thursday February 22 2024 13:47:48 EST Ventricular Rate:  76 PR Interval:  196 QRS Duration:  172 QT Interval:  448 QTC Calculation: 504 R Axis:   -67  Text Interpretation: Atrial-sensed ventricular-paced rhythm When compared with ECG of 06-Feb-2018 14:29, Vent. rate has increased BY   2 BPM Confirmed by [name] Jeffery ([id number]) on 02/22/2024 2:05:48 PM    Risk Assessment/Calculations:              Physical Exam:   VS:  BP 115/68    Pulse 77   Ht 6' 1 (1.854 m)   Wt 158 lb 6.4 oz (71.8 kg)   SpO2 97%   BMI 20.90 kg/m    Wt Readings from Last 3 Encounters:  02/22/24 158 lb 6.4 oz (71.8 kg)  12/07/23 157 lb 3.2 oz (71.3 kg)  11/01/23 157 lb (71.2 kg)     GEN: Well nourished, well developed in no acute distress NECK: No JVD; No carotid bruits CARDIAC: RRR, no murmurs, rubs, gallops RESPIRATORY:  Clear to auscultation without rales, wheezing or rhonchi  ABDOMEN: Soft, non-tender, non-distended EXTREMITIES:  No edema; No deformity   ASSESSMENT AND PLAN:   PVCs: The patient is having no new symptomatic ectopy.  He did see EP  recently and had 22-25 PVCs per minute.  He is not particularly bothered with this.   CHB:   He is up to date with pacemaker interrogation last month.  I reviewed the EP notes.   HTN: His blood pressure is well-controlled.  No change in therapy.  FATIGUE: Unfortunately this continues to be his most significant complaint.  His labs are unremarkable.  Thyroid and CBC are up-to-date.  There are not any meds that I think would obviously be the culprit that are expendable.  Follow up with me in one year.   Signed, Jeffery Schilling, MD

## 2024-02-22 ENCOUNTER — Encounter: Payer: Self-pay | Admitting: Cardiology

## 2024-02-22 ENCOUNTER — Ambulatory Visit: Attending: Cardiology | Admitting: Cardiology

## 2024-02-22 VITALS — BP 115/68 | HR 77 | Ht 73.0 in | Wt 158.4 lb

## 2024-02-22 DIAGNOSIS — I442 Atrioventricular block, complete: Secondary | ICD-10-CM | POA: Diagnosis present

## 2024-02-22 DIAGNOSIS — I10 Essential (primary) hypertension: Secondary | ICD-10-CM | POA: Diagnosis present

## 2024-02-22 DIAGNOSIS — I493 Ventricular premature depolarization: Secondary | ICD-10-CM | POA: Insufficient documentation

## 2024-02-22 NOTE — Patient Instructions (Signed)

## 2024-02-24 ENCOUNTER — Encounter: Payer: Self-pay | Admitting: Physician Assistant

## 2024-02-24 ENCOUNTER — Other Ambulatory Visit (HOSPITAL_BASED_OUTPATIENT_CLINIC_OR_DEPARTMENT_OTHER): Payer: Self-pay

## 2024-02-27 ENCOUNTER — Other Ambulatory Visit (HOSPITAL_BASED_OUTPATIENT_CLINIC_OR_DEPARTMENT_OTHER): Payer: Self-pay

## 2024-02-29 ENCOUNTER — Other Ambulatory Visit: Payer: Self-pay | Admitting: Rheumatology

## 2024-02-29 ENCOUNTER — Other Ambulatory Visit (HOSPITAL_BASED_OUTPATIENT_CLINIC_OR_DEPARTMENT_OTHER): Payer: Self-pay

## 2024-02-29 DIAGNOSIS — Z79899 Other long term (current) drug therapy: Secondary | ICD-10-CM

## 2024-02-29 MED ORDER — METHOTREXATE SODIUM 2.5 MG PO TABS
15.0000 mg | ORAL_TABLET | ORAL | 0 refills | Status: AC
  Filled 2024-02-29: qty 72, 84d supply, fill #0

## 2024-02-29 NOTE — Telephone Encounter (Signed)
 Last Fill: 11/23/2023  Labs: 09/14/2023 urine protein creatinine ratio was normal, UA negative, creatinine 0.6, GFR 127.7, CBC WBC 4.8, hemoglobin 14.7, platelets 275, LDL 70, vitamin D31.4, hemoglobin A1c 6.8. (per office note on 11/01/2023)  Next Visit: 05/08/2024  Last Visit: 11/01/2023  DX: Rheumatoid arthritis of multiple sites without rheumatoid factor   Current Dose per office note on 11/01/2023: MTX 6 tabs po once weekly  Advised patient he is due to update labs. Patient will come by the office this week for labs. Standing orders are in place.   Okay to refill Methotrexate ?

## 2024-03-05 ENCOUNTER — Other Ambulatory Visit: Payer: Self-pay

## 2024-03-05 DIAGNOSIS — Z79899 Other long term (current) drug therapy: Secondary | ICD-10-CM

## 2024-03-06 ENCOUNTER — Ambulatory Visit: Payer: Self-pay | Admitting: Rheumatology

## 2024-03-06 LAB — COMPLETE METABOLIC PANEL WITHOUT GFR
AG Ratio: 1.7 (calc) (ref 1.0–2.5)
ALT: 15 U/L (ref 9–46)
AST: 9 U/L — ABNORMAL LOW (ref 10–35)
Albumin: 4.4 g/dL (ref 3.6–5.1)
Alkaline phosphatase (APISO): 48 U/L (ref 35–144)
BUN/Creatinine Ratio: 25 (calc) — ABNORMAL HIGH (ref 6–22)
BUN: 17 mg/dL (ref 7–25)
CO2: 34 mmol/L — ABNORMAL HIGH (ref 20–32)
Calcium: 9.5 mg/dL (ref 8.6–10.3)
Chloride: 96 mmol/L — ABNORMAL LOW (ref 98–110)
Creat: 0.67 mg/dL — ABNORMAL LOW (ref 0.70–1.22)
Globulin: 2.6 g/dL (ref 1.9–3.7)
Glucose, Bld: 196 mg/dL — ABNORMAL HIGH (ref 65–99)
Potassium: 4.7 mmol/L (ref 3.5–5.3)
Sodium: 137 mmol/L (ref 135–146)
Total Bilirubin: 0.7 mg/dL (ref 0.2–1.2)
Total Protein: 7 g/dL (ref 6.1–8.1)

## 2024-03-06 LAB — CBC WITH DIFFERENTIAL/PLATELET
Absolute Lymphocytes: 1440 {cells}/uL (ref 850–3900)
Absolute Monocytes: 474 {cells}/uL (ref 200–950)
Basophils Absolute: 18 {cells}/uL (ref 0–200)
Basophils Relative: 0.3 %
Eosinophils Absolute: 78 {cells}/uL (ref 15–500)
Eosinophils Relative: 1.3 %
HCT: 42.5 % (ref 39.4–51.1)
Hemoglobin: 14.2 g/dL (ref 13.2–17.1)
MCH: 32.3 pg (ref 27.0–33.0)
MCHC: 33.4 g/dL (ref 31.6–35.4)
MCV: 96.8 fL (ref 81.4–101.7)
MPV: 9.5 fL (ref 7.5–12.5)
Monocytes Relative: 7.9 %
Neutro Abs: 3990 {cells}/uL (ref 1500–7800)
Neutrophils Relative %: 66.5 %
Platelets: 247 Thousand/uL (ref 140–400)
RBC: 4.39 Million/uL (ref 4.20–5.80)
RDW: 12.1 % (ref 11.0–15.0)
Total Lymphocyte: 24 %
WBC: 6 Thousand/uL (ref 3.8–10.8)

## 2024-03-06 NOTE — Progress Notes (Signed)
 Glucose is elevated at 196.  All other labs are stable.  Please forward results to his PCP.

## 2024-03-12 ENCOUNTER — Ambulatory Visit: Payer: Medicare Other

## 2024-03-12 DIAGNOSIS — I493 Ventricular premature depolarization: Secondary | ICD-10-CM

## 2024-03-13 LAB — CUP PACEART REMOTE DEVICE CHECK
Battery Remaining Longevity: 43 mo
Battery Voltage: 2.94 V
Brady Statistic AP VP Percent: 4.6 %
Brady Statistic AP VS Percent: 0 %
Brady Statistic AS VP Percent: 95.38 %
Brady Statistic AS VS Percent: 0.02 %
Brady Statistic RA Percent Paced: 4.6 %
Brady Statistic RV Percent Paced: 99.98 %
Date Time Interrogation Session: 20251201202912
Implantable Lead Connection Status: 753985
Implantable Lead Connection Status: 753985
Implantable Lead Implant Date: 20180827
Implantable Lead Implant Date: 20180827
Implantable Lead Location: 753859
Implantable Lead Location: 753860
Implantable Lead Model: 4076
Implantable Lead Model: 4076
Implantable Pulse Generator Implant Date: 20180827
Lead Channel Impedance Value: 323 Ohm
Lead Channel Impedance Value: 399 Ohm
Lead Channel Impedance Value: 399 Ohm
Lead Channel Impedance Value: 456 Ohm
Lead Channel Pacing Threshold Amplitude: 0.75 V
Lead Channel Pacing Threshold Amplitude: 0.75 V
Lead Channel Pacing Threshold Pulse Width: 0.4 ms
Lead Channel Pacing Threshold Pulse Width: 0.4 ms
Lead Channel Sensing Intrinsic Amplitude: 13.375 mV
Lead Channel Sensing Intrinsic Amplitude: 13.375 mV
Lead Channel Sensing Intrinsic Amplitude: 4.5 mV
Lead Channel Sensing Intrinsic Amplitude: 4.5 mV
Lead Channel Setting Pacing Amplitude: 1.5 V
Lead Channel Setting Pacing Amplitude: 2.5 V
Lead Channel Setting Pacing Pulse Width: 0.4 ms
Lead Channel Setting Sensing Sensitivity: 4 mV
Zone Setting Status: 755011

## 2024-03-15 ENCOUNTER — Ambulatory Visit: Payer: Self-pay | Admitting: Internal Medicine

## 2024-03-15 NOTE — Progress Notes (Signed)
 Remote PPM Transmission

## 2024-03-21 ENCOUNTER — Other Ambulatory Visit: Payer: Self-pay

## 2024-03-23 ENCOUNTER — Other Ambulatory Visit (HOSPITAL_BASED_OUTPATIENT_CLINIC_OR_DEPARTMENT_OTHER): Payer: Self-pay

## 2024-03-25 ENCOUNTER — Other Ambulatory Visit (HOSPITAL_BASED_OUTPATIENT_CLINIC_OR_DEPARTMENT_OTHER): Payer: Self-pay

## 2024-03-25 MED ORDER — METFORMIN HCL 500 MG PO TABS
500.0000 mg | ORAL_TABLET | Freq: Every day | ORAL | 4 refills | Status: DC
  Filled 2024-03-25: qty 90, 90d supply, fill #0

## 2024-04-10 ENCOUNTER — Other Ambulatory Visit: Payer: Self-pay | Admitting: Rheumatology

## 2024-04-10 DIAGNOSIS — M0609 Rheumatoid arthritis without rheumatoid factor, multiple sites: Secondary | ICD-10-CM

## 2024-04-12 ENCOUNTER — Encounter: Payer: Self-pay | Admitting: Physician Assistant

## 2024-04-12 ENCOUNTER — Other Ambulatory Visit (HOSPITAL_BASED_OUTPATIENT_CLINIC_OR_DEPARTMENT_OTHER): Payer: Self-pay

## 2024-04-12 MED ORDER — DICLOFENAC SODIUM 1 % EX GEL
CUTANEOUS | 2 refills | Status: AC
  Filled 2024-04-12 – 2024-04-23 (×2): qty 300, 19d supply, fill #0

## 2024-04-12 NOTE — Telephone Encounter (Signed)
 Last Fill: 05/25/2023  Labs: 03/05/2024 Glucose is elevated at 196. All other labs are stable. Please forward results to his PCP.   Next Visit: 05/08/2024  Last Visit: 11/01/2023  DX: Rheumatoid arthritis of multiple sites without rheumatoid factor   Current Dose per office note 11/01/2023: not mentioned  Okay to refill Diclofenac ?

## 2024-04-15 ENCOUNTER — Other Ambulatory Visit (HOSPITAL_BASED_OUTPATIENT_CLINIC_OR_DEPARTMENT_OTHER): Payer: Self-pay

## 2024-04-15 MED ORDER — METFORMIN HCL 500 MG PO TABS
500.0000 mg | ORAL_TABLET | Freq: Two times a day (BID) | ORAL | 3 refills | Status: AC
  Filled 2024-04-15 – 2024-04-17 (×2): qty 120, 60d supply, fill #0

## 2024-04-17 ENCOUNTER — Other Ambulatory Visit (HOSPITAL_BASED_OUTPATIENT_CLINIC_OR_DEPARTMENT_OTHER): Payer: Self-pay

## 2024-04-19 ENCOUNTER — Other Ambulatory Visit (HOSPITAL_BASED_OUTPATIENT_CLINIC_OR_DEPARTMENT_OTHER): Payer: Self-pay

## 2024-04-22 ENCOUNTER — Other Ambulatory Visit (HOSPITAL_BASED_OUTPATIENT_CLINIC_OR_DEPARTMENT_OTHER): Payer: Self-pay

## 2024-04-23 ENCOUNTER — Other Ambulatory Visit (HOSPITAL_BASED_OUTPATIENT_CLINIC_OR_DEPARTMENT_OTHER): Payer: Self-pay

## 2024-04-23 ENCOUNTER — Encounter: Payer: Self-pay | Admitting: Physician Assistant

## 2024-04-24 ENCOUNTER — Telehealth: Payer: Self-pay

## 2024-04-24 ENCOUNTER — Other Ambulatory Visit (HOSPITAL_BASED_OUTPATIENT_CLINIC_OR_DEPARTMENT_OTHER): Payer: Self-pay

## 2024-04-24 MED ORDER — CLONAZEPAM 0.5 MG PO TABS
0.5000 mg | ORAL_TABLET | Freq: Every evening | ORAL | 0 refills | Status: AC | PRN
  Filled 2024-04-24: qty 30, 30d supply, fill #0

## 2024-04-24 NOTE — Telephone Encounter (Signed)
 Auth Submission: NO AUTH NEEDED Site of care: Site of care: [facility] Payer: Medicare A/B with BCBS supplement Medication & CPT/J Code(s) submitted: Prolia  (Denosumab ) [id number] Diagnosis Code:  Route of submission (phone, fax, portal):  Phone # Fax # Auth type: Buy/Bill PB Units/visits requested: 60mg  x 2 doses Reference number:  Approval from: 04/24/24 to 05/11/25   Patient does not want to switch to [facility] due to inconvenience.

## 2024-04-24 NOTE — Progress Notes (Signed)
 "  Office Visit Note  Patient: Jeffery [name]             Date of Birth: February 17, 1935           MRN: [id number]             PCP: Jeffery Rush, MD Referring: Jeffery Rush, MD Visit Date: 05/08/2024 Occupation: Data Unavailable  Subjective:  Fatigue, medication manage  History of Present Illness: Jeffery [name] is a 89 y.o. male with seropositive rheumatoid arthritis, PMR, osteoarthritis, degenerative disc disease and osteoporosis.  He returns today after his last visit in July 2025.  He denies having a flare of rheumatoid arthritis.  He has been taking methotrexate  and hydroxychloroquine  on a regular basis.  He ambulates with the help of a rollator walker.  He states he notices weakness in his lower extremities.  He was evaluated by Dr. Onita recently and was given a prescription for physical therapy which she has not started yet.  He denies any shortness of breath.  He has not noticed symptoms of PMR flare.  He has been getting Prolia  injections on a regular basis which has been tolerating well without any side effects.  He complains of pain in his left toe for the last 2 months.    Activities of Daily Living:  Patient reports morning stiffness for 1 hour.   Patient Reports nocturnal pain.  Difficulty dressing/grooming: Denies Difficulty climbing stairs: Reports Difficulty getting out of chair: Denies Difficulty using hands for taps, buttons, cutlery, and/or writing: Denies  Review of Systems  Constitutional:  Positive for fatigue.  HENT:  Positive for mouth dryness. Negative for mouth sores.   Eyes:  Negative for dryness.  Respiratory:  Negative for shortness of breath.   Cardiovascular:  Negative for chest pain and palpitations.  Gastrointestinal:  Negative for blood in stool, constipation and diarrhea.  Endocrine: Negative for increased urination.  Genitourinary:  Negative for involuntary urination.  Musculoskeletal:  Positive for joint pain, gait problem, joint pain, muscle  weakness and morning stiffness. Negative for joint swelling, myalgias, muscle tenderness and myalgias.  Skin:  Negative for color change, rash, hair loss and sensitivity to sunlight.  Allergic/Immunologic: Negative for susceptible to infections.  Neurological:  Negative for dizziness and headaches.  Hematological:  Negative for swollen glands.  Psychiatric/Behavioral:  Positive for sleep disturbance. Negative for depressed mood. The patient is not nervous/anxious.     PMFS History:  Patient Active Problem List   Diagnosis Date Noted   Spinal stenosis of lumbar region 04/14/2023   Shortness of breath 01/23/2023   PVC's (premature ventricular contractions) 12/12/2022   Ptosis of left eyelid 12/07/2022   Slurred speech 12/07/2022   Gait abnormality 12/07/2022   Vitreomacular adhesion, right 08/16/2021   Macular retinoschisis of right eye 08/16/2021   Pulmonary nodules 07/04/2021   Essential hypertension 07/04/2021   Sleep apnea 06/28/2020   Dyslipidemia 06/28/2020   Memory changes 04/16/2020   Pacemaker ECG pattern 09/20/2019   Treatment-emergent central sleep apnea 09/20/2019   Round hole of right eye 08/26/2019   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/26/2019   Cystoid macular edema of right eye 08/26/2019   Pseudophakia of both eyes 08/26/2019   Obstructive sleep apnea of adult 08/26/2019   Educated about COVID-19 virus infection 10/08/2018   Aortic root dilatation 10/08/2018   Mitral annular calcification 10/08/2018   Neuropathy 08/21/2017   Hydroxychloroquine  causing adverse effect in therapeutic use 08/21/2017   Symptomatic bradycardia 02/23/2017   Status  cardiac pacemaker 02/23/2017   CSA (central sleep apnea) 02/23/2017   Heart block AV complete (HCC) 01/18/2017   Vitamin D  deficiency 02/08/2016   Trigger finger, left middle finger 02/08/2016   Long-term use of Plaquenil  02/08/2016   Bursitis of right shoulder 02/08/2016   DDD (degenerative disc disease), lumbar  02/08/2016   DDD (degenerative disc disease), thoracic 02/08/2016   Rheumatoid arthritis of multiple sites without rheumatoid factor (HCC) 11/28/2015    Class: Chronic   Other secondary osteoarthritis of both hands 11/28/2015    Class: Chronic   PMR (polymyalgia rheumatica) 11/28/2015    Class: Chronic   Osteoporosis 11/28/2015    Class: Chronic   Prostate cancer (HCC) 11/28/2015    Class: History of   Duodenal ulcer 11/28/2015    Class: Chronic   High cholesterol 11/28/2015   Dizziness 07/16/2015   Incomplete RBBB 03/31/2015   Sleep apnea, primary central 03/03/2015   Polyneuropathy (HCC) 01/24/2013   Right inguinal hernia 07/05/2012    Past Medical History:  Diagnosis Date   Arthritis    Autoimmune disorder    BCC (basal cell carcinoma)    BPH (benign prostatic hypertrophy) 2008   Cancer [facility]) 2008   Prostate   Cervical stenosis of spine    per patient    Complete heart block (HCC)    Complex sleep apnea syndrome    does not use CPAP   Diabetes mellitus without complication (HCC)    Diverticulosis    ED (erectile dysfunction)    Gastritis    GERD (gastroesophageal reflux disease)    Hemorrhoids    Hernia, inguinal    Hyperlipidemia    IFG (impaired fasting glucose)    Osteopenia    Peptic ulcer disease with hemorrhage 1993   Shingles    Sleep apnea    Vitamin D  deficiency     Family History  Problem Relation Age of Onset   Heart failure Mother        Died age 61.  Apparently had valve surgery.    Congenital heart disease Brother    Stroke Maternal Grandfather    Colon cancer Neg Hx    Stomach cancer Neg Hx    Esophageal cancer Neg Hx    Rectal cancer Neg Hx    Past Surgical History:  Procedure Laterality Date   BACK SURGERY N/A 1984   barium swallow study  2023   x2   CATARACT EXTRACTION, BILATERAL     HERNIA REPAIR     LAMINECTOMY  1984   L 5   PACEMAKER IMPLANT  12/05/2016   MDT Azure XT DR MRI conditional PPM implanted for CHB by Dr Jeffery [name] in  [place]   PACEMAKER INSERTION  12/05/2016   PROSTATE SURGERY  12/2006   Robotic   TONSILLECTOMY  89 years old   TRIGGER FINGER RELEASE  03/01/2012   Procedure: MINOR RELEASE TRIGGER FINGER/A-1 PULLEY;  Surgeon: Jeffery LULLA Leonor Mickey., MD;  Location: [facility];  Service: Orthopedics;  Laterality: Left;  long finger   TRIGGER FINGER RELEASE Left 02/08/2018   Procedure: RELEASE TRIGGER FINGER/A-1 PULLEY LEFT RING FINGER;  Surgeon: Murrell Kuba, MD;  Location: [facility];  Service: Orthopedics;  Laterality: Left;   UPPER GI ENDOSCOPY  2023   Social History[1] Social History   Social History Narrative   Right handed, Retired .Married, 2 kids. Caffeine none.   3 Step      Immunization History  Administered Date(s) Administered   INFLUENZA, HIGH  DOSE SEASONAL PF 03/03/2017, 01/26/2018   Influenza-Unspecified 01/16/2023   Moderna Covid-19 Fall Seasonal Vaccine 71yrs & older 01/09/2024   Moderna SARS-COV2 Booster Vaccination 10/15/2020   Moderna Sars-Covid-2 Vaccination 04/13/2019, 05/14/2019, 12/12/2019   Pfizer(Comirnaty )Fall Seasonal Vaccine 12 years and older 03/17/2022     Objective: Vital Signs: BP 110/69   Pulse 87   Temp 97.9 F (36.6 C)   Resp 14   Ht 6' 1 (1.854 m)   Wt 155 lb 3.2 oz (70.4 kg)   SpO2 96%   BMI 20.48 kg/m    Physical Exam Vitals and nursing note reviewed.  Constitutional:      Appearance: He is well-developed.  HENT:     Head: Normocephalic and atraumatic.  Eyes:     Conjunctiva/sclera: Conjunctivae normal.     Pupils: Pupils are equal, round, and reactive to light.  Cardiovascular:     Rate and Rhythm: Normal rate and regular rhythm.     Heart sounds: Normal heart sounds.  Pulmonary:     Effort: Pulmonary effort is normal.     Breath sounds: Normal breath sounds.  Abdominal:     General: Bowel sounds are normal.     Palpations: Abdomen is soft.  Musculoskeletal:     Cervical back: Normal range of motion and neck  supple.  Skin:    General: Skin is warm and dry.     Capillary Refill: Capillary refill takes less than 2 seconds.  Neurological:     Mental Status: He is alert and oriented to person, place, and time.  Psychiatric:        Behavior: Behavior normal.      Musculoskeletal Exam: He had limitation with lateral rotation of the cervical spine.  Thoracic kyphosis without tenderness was noted.  He had some discomfort range of motion of the lumbar spine.  Shoulders, elbows, wrist joints in good range of motion.  There was no synovitis over MCPs PIPs or DIPs.  He had bilateral DIP and PIP thickening with limited flexion and extension.  He did not complete fist formation.  Hip joints and knee joints in good range of motion.  There was no tenderness over ankles and MTPs.  He had superficial ulcer on the medial aspect of his left fifth toe.  CDAI Exam: CDAI Score: -- Patient Global: 0 / 100; Provider Global: 0 / 100 Swollen: --; Tender: -- Joint Exam 05/08/2024   No joint exam has been documented for this visit   There is currently no information documented on the homunculus. Go to the Rheumatology activity and complete the homunculus joint exam.  Investigation: No additional findings.  Imaging: No results found.  Recent Labs: Lab Results  Component Value Date   WBC 6.0 03/05/2024   HGB 14.2 03/05/2024   PLT 247 03/05/2024   NA 137 03/05/2024   K 4.7 03/05/2024   CL 96 (L) 03/05/2024   CO2 34 (H) 03/05/2024   GLUCOSE 196 (H) 03/05/2024   BUN 17 03/05/2024   CREATININE 0.67 (L) 03/05/2024   BILITOT 0.7 03/05/2024   ALKPHOS 85 03/19/2019   AST 9 (L) 03/05/2024   ALT 15 03/05/2024   PROT 7.0 03/05/2024   ALBUMIN 4.7 (H) 03/19/2019   CALCIUM 9.5 03/05/2024   GFRAA 102 08/10/2020    Speciality Comments: PLQ Eye Exam: 09/14/2023 normal [facility], with Dr. Arley Ruder, Follow up in a year. Patient also referred to Dr. Arlyss King for Visual Field  Testing  Procedures:  No procedures  performed Allergies: Sulfasalazine, Demerol [meperidine], Garlic, Other, and Sulfa antibiotics   Assessment / Plan:     Visit Diagnoses: Rheumatoid arthritis of multiple sites without rheumatoid factor (HCC) - RF negative, CCP negative, 1433 eta negative, ANA negative.  Patient denies having a flare of rheumatoid arthritis.  He had no synovitis on the examination.  He continues to take methotrexate  and Plaquenil  on a regular basis without any interruption.  High risk medication use - MTX 6 tabs po once weekly, folic acid  1 mg po daily, PLQ 200 mg BID. PLQ Eye Exam: 09/14/2023.  Labs from March 05, 2024 were reviewed which are within normal limits.  He was advised to get repeat labs in February and every 3 months to monitor for drug toxicity.  Annual eye examination was advised.  Information regarding the immunization was placed in the AVS.  He was advised to hold methotrexate  if he develops an infection.  PMR (polymyalgia rheumatica) -no increased muscular weakness or tenderness was noted.  He has been off prednisone  for several years now.  Primary osteoarthritis of both hands-he has severe osteoarthritis in his hands with thin complete fist formation.  No active synovitis was noted.  Toe pain, left-he complains of pain in his left fifth toe.  No synovitis or swelling was noted.  He had an ulcer over the medial aspect of his toe due to overcrowding of the toes.  Toe separator was advised.  DDD (degenerative disc disease), cervical-he had limited range of motion without any discomfort or radiculopathy.  DDD (degenerative disc disease), thoracic-kyphosis without discomfort was noted.  Spondylosis of lumbar spine-his off-and-on discomfort in his lower back.  Currently asymptomatic.  Shortness of breath - PFTs were normal.  He was advised to use CPAP.  Cardiology workup was negative.  Other fatigue-he continues to have fatigue.  He had extensive workup which  was negative.  Age-related osteoporosis without current pathological fracture - 03/21 T -1.9.  January 16, 2023 The BMD measured at [facility] is 0.748 g/cm2 with aT-score of -2.5.  Prolia  last injection was December 07, 2023.  He is scheduled to have next injection on June 10, 2024.  Vitamin D  deficiency-has been taking vitamin D .  Gait instability-he ambulates with the help of a rollator walker.  Lower extremity muscle wasting was noted.  Several exercises were demonstrated in the office today.  I also encouraged him to do stationary bike and physical therapy.  He has a prescription for physical therapy from his PCP.  Other medical problems are listed as follows:  History of prostate cancer  History of diabetes mellitus  Heart block AV complete (HCC)  History of sleep apnea  Orders: No orders of the defined types were placed in this encounter.  No orders of the defined types were placed in this encounter.    Follow-Up Instructions: Return in about 5 months (around 10/06/2024) for Rheumatoid arthritis, Osteoarthritis, Osteoporosis.   Maya Nash, MD  Note - This record has been created using Animal nutritionist.  Chart creation errors have been sought, but may not always  have been located. Such creation errors do not reflect on  the standard of medical care.     [1]  Social History Tobacco Use   Smoking status: Never    Passive exposure: Never   Smokeless tobacco: Never  Vaping Use   Vaping status: Never Used  Substance Use Topics   Alcohol use: Not Currently   Drug use: No   "

## 2024-04-29 ENCOUNTER — Other Ambulatory Visit (HOSPITAL_BASED_OUTPATIENT_CLINIC_OR_DEPARTMENT_OTHER): Payer: Self-pay

## 2024-05-08 ENCOUNTER — Encounter: Payer: Self-pay | Admitting: Rheumatology

## 2024-05-08 ENCOUNTER — Ambulatory Visit: Attending: Rheumatology | Admitting: Rheumatology

## 2024-05-08 VITALS — BP 110/69 | HR 87 | Temp 97.9°F | Resp 14 | Ht 73.0 in | Wt 155.2 lb

## 2024-05-08 DIAGNOSIS — Z8546 Personal history of malignant neoplasm of prostate: Secondary | ICD-10-CM | POA: Diagnosis present

## 2024-05-08 DIAGNOSIS — Z8669 Personal history of other diseases of the nervous system and sense organs: Secondary | ICD-10-CM | POA: Diagnosis present

## 2024-05-08 DIAGNOSIS — M5134 Other intervertebral disc degeneration, thoracic region: Secondary | ICD-10-CM | POA: Diagnosis not present

## 2024-05-08 DIAGNOSIS — I442 Atrioventricular block, complete: Secondary | ICD-10-CM | POA: Diagnosis present

## 2024-05-08 DIAGNOSIS — Z8639 Personal history of other endocrine, nutritional and metabolic disease: Secondary | ICD-10-CM | POA: Insufficient documentation

## 2024-05-08 DIAGNOSIS — Z79899 Other long term (current) drug therapy: Secondary | ICD-10-CM | POA: Diagnosis not present

## 2024-05-08 DIAGNOSIS — E559 Vitamin D deficiency, unspecified: Secondary | ICD-10-CM | POA: Diagnosis not present

## 2024-05-08 DIAGNOSIS — M5030 Other cervical disc degeneration, unspecified cervical region: Secondary | ICD-10-CM | POA: Insufficient documentation

## 2024-05-08 DIAGNOSIS — M810 Age-related osteoporosis without current pathological fracture: Secondary | ICD-10-CM | POA: Diagnosis not present

## 2024-05-08 DIAGNOSIS — M19041 Primary osteoarthritis, right hand: Secondary | ICD-10-CM | POA: Diagnosis not present

## 2024-05-08 DIAGNOSIS — M19042 Primary osteoarthritis, left hand: Secondary | ICD-10-CM | POA: Insufficient documentation

## 2024-05-08 DIAGNOSIS — R0602 Shortness of breath: Secondary | ICD-10-CM | POA: Diagnosis not present

## 2024-05-08 DIAGNOSIS — M47816 Spondylosis without myelopathy or radiculopathy, lumbar region: Secondary | ICD-10-CM | POA: Diagnosis not present

## 2024-05-08 DIAGNOSIS — M0609 Rheumatoid arthritis without rheumatoid factor, multiple sites: Secondary | ICD-10-CM | POA: Diagnosis not present

## 2024-05-08 DIAGNOSIS — M353 Polymyalgia rheumatica: Secondary | ICD-10-CM | POA: Insufficient documentation

## 2024-05-08 DIAGNOSIS — M79675 Pain in left toe(s): Secondary | ICD-10-CM | POA: Diagnosis not present

## 2024-05-08 DIAGNOSIS — R2681 Unsteadiness on feet: Secondary | ICD-10-CM | POA: Diagnosis present

## 2024-05-08 DIAGNOSIS — R5383 Other fatigue: Secondary | ICD-10-CM | POA: Diagnosis not present

## 2024-05-08 DIAGNOSIS — G7000 Myasthenia gravis without (acute) exacerbation: Secondary | ICD-10-CM

## 2024-05-08 NOTE — Patient Instructions (Signed)
 Standing Labs We placed an order today for your standing lab work.   Please have your standing labs drawn in February and every 3 months  Please have your labs drawn 2 weeks prior to your appointment so that the provider can discuss your lab results at your appointment, if possible.  Please note that you may see your imaging and lab results in MyChart before we have reviewed them. We will contact you once all results are reviewed. Please allow our office up to 72 hours to thoroughly review all of the results before contacting the office for clarification of your results.  WALK-IN LAB HOURS  Monday through Thursday from 8:00 am - 4:30 pm and Friday from 8:00 am-12:00 pm.  Patients with office visits requiring labs will be seen before walk-in labs.  You may encounter longer than normal wait times. Please allow additional time. Wait times may be shorter on  Monday and Thursday afternoons.  We do not book appointments for walk-in labs. We appreciate your patience and understanding with our staff.   Labs are drawn by [facility]. Please bring your co-pay at the time of your lab draw.  You may receive a bill from [facility] for your lab work.  Please note if you are on Hydroxychloroquine and and an order has been placed for a Hydroxychloroquine level,  you will need to have it drawn 4 hours or more after your last dose.  If you wish to have your labs drawn at another location, please call the office 24 hours in advance so we can fax the orders.  The office is located at [address], [address], [place], [place] [address]   If you have any questions regarding directions or hours of operation,  please call [phone].   As a reminder, please drink plenty of water prior to coming for your lab work. Thanks!   Vaccines You are taking a medication(s) that can suppress your immune system.  The following immunizations are recommended: Flu annually Covid-19  RSV Td/Tdap (tetanus, diphtheria, pertussis)  every 10 years Pneumonia (Prevnar 15 then Pneumovax 23 at least 1 year apart.  Alternatively, can take Prevnar 20 without needing additional dose) Shingrix: 2 doses from 4 weeks to 6 months apart  Please check with your PCP to make sure you are up to date.   If you have signs or symptoms of an infection or start antibiotics: First, call your PCP for workup of your infection. Hold your medication through the infection, until you complete your antibiotics, and until symptoms resolve if you take the following: Injectable medication (Actemra , Benlysta, Cimzia, Cosentyx, Enbrel, Humira, Kevzara, Orencia, Remicade, Simponi, Stelara, Taltz, Tremfya) Methotrexate Leflunomide (Arava) Mycophenolate (Cellcept) Earma, Olumiant, or Rinvoq

## 2024-06-10 ENCOUNTER — Ambulatory Visit

## 2024-10-08 ENCOUNTER — Ambulatory Visit: Admitting: Rheumatology
